# Patient Record
Sex: Female | Born: 1950
Health system: Southern US, Community
[De-identification: ages and names within clinical notes are randomized; demographics above are authoritative.]

## PROBLEM LIST (undated history)

## (undated) DIAGNOSIS — K259 Gastric ulcer, unspecified as acute or chronic, without hemorrhage or perforation: Secondary | ICD-10-CM

## (undated) DIAGNOSIS — Z8669 Personal history of other diseases of the nervous system and sense organs: Secondary | ICD-10-CM

## (undated) DIAGNOSIS — F32A Depression, unspecified: Secondary | ICD-10-CM

## (undated) DIAGNOSIS — R351 Nocturia: Secondary | ICD-10-CM

## (undated) DIAGNOSIS — M549 Dorsalgia, unspecified: Secondary | ICD-10-CM

## (undated) DIAGNOSIS — M1990 Unspecified osteoarthritis, unspecified site: Secondary | ICD-10-CM

## (undated) DIAGNOSIS — K219 Gastro-esophageal reflux disease without esophagitis: Secondary | ICD-10-CM

## (undated) DIAGNOSIS — M2550 Pain in unspecified joint: Secondary | ICD-10-CM

## (undated) DIAGNOSIS — L280 Lichen simplex chronicus: Secondary | ICD-10-CM

## (undated) DIAGNOSIS — G4700 Insomnia, unspecified: Secondary | ICD-10-CM

## (undated) DIAGNOSIS — F419 Anxiety disorder, unspecified: Secondary | ICD-10-CM

## (undated) DIAGNOSIS — T4145XA Adverse effect of unspecified anesthetic, initial encounter: Secondary | ICD-10-CM

## (undated) DIAGNOSIS — R319 Hematuria, unspecified: Secondary | ICD-10-CM

## (undated) DIAGNOSIS — K2970 Gastritis, unspecified, without bleeding: Secondary | ICD-10-CM

## (undated) DIAGNOSIS — G8929 Other chronic pain: Secondary | ICD-10-CM

## (undated) DIAGNOSIS — M2540 Effusion, unspecified joint: Secondary | ICD-10-CM

## (undated) DIAGNOSIS — F329 Major depressive disorder, single episode, unspecified: Secondary | ICD-10-CM

## (undated) DIAGNOSIS — J329 Chronic sinusitis, unspecified: Secondary | ICD-10-CM

## (undated) DIAGNOSIS — R51 Headache: Secondary | ICD-10-CM

## (undated) DIAGNOSIS — E039 Hypothyroidism, unspecified: Secondary | ICD-10-CM

## (undated) DIAGNOSIS — E559 Vitamin D deficiency, unspecified: Secondary | ICD-10-CM

## (undated) DIAGNOSIS — L0390 Cellulitis, unspecified: Secondary | ICD-10-CM

## (undated) DIAGNOSIS — M8580 Other specified disorders of bone density and structure, unspecified site: Secondary | ICD-10-CM

## (undated) DIAGNOSIS — E785 Hyperlipidemia, unspecified: Secondary | ICD-10-CM

## (undated) DIAGNOSIS — Z8619 Personal history of other infectious and parasitic diseases: Secondary | ICD-10-CM

## (undated) DIAGNOSIS — D649 Anemia, unspecified: Secondary | ICD-10-CM

## (undated) DIAGNOSIS — R7303 Prediabetes: Secondary | ICD-10-CM

## (undated) DIAGNOSIS — Z8719 Personal history of other diseases of the digestive system: Secondary | ICD-10-CM

## (undated) DIAGNOSIS — T8859XA Other complications of anesthesia, initial encounter: Secondary | ICD-10-CM

## (undated) HISTORY — DX: Unspecified osteoarthritis, unspecified site: M19.90

## (undated) HISTORY — DX: Anemia, unspecified: D64.9

## (undated) HISTORY — PX: TONSILLECTOMY: SUR1361

## (undated) HISTORY — DX: Other specified disorders of bone density and structure, unspecified site: M85.80

## (undated) HISTORY — PX: OTHER SURGICAL HISTORY: SHX169

## (undated) HISTORY — PX: COLONOSCOPY: SHX174

## (undated) HISTORY — PX: JOINT REPLACEMENT: SHX530

## (undated) HISTORY — DX: Cellulitis, unspecified: L03.90

## (undated) HISTORY — PX: ESOPHAGOGASTRODUODENOSCOPY: SHX1529

## (undated) HISTORY — DX: Lichen simplex chronicus: L28.0

## (undated) HISTORY — DX: Gastric ulcer, unspecified as acute or chronic, without hemorrhage or perforation: K25.9

## (undated) HISTORY — DX: Chronic sinusitis, unspecified: J32.9

## (undated) HISTORY — PX: TUBAL LIGATION: SHX77

## (undated) HISTORY — DX: Vitamin D deficiency, unspecified: E55.9

## (undated) HISTORY — DX: Personal history of other infectious and parasitic diseases: Z86.19

---

## 1992-04-01 HISTORY — PX: EYE SURGERY: SHX253

## 1998-01-03 ENCOUNTER — Ambulatory Visit: Admission: RE | Admit: 1998-01-03 | Discharge: 1998-01-03 | Payer: Self-pay | Admitting: Obstetrics & Gynecology

## 1998-04-21 ENCOUNTER — Encounter: Admission: RE | Admit: 1998-04-21 | Discharge: 1998-07-20 | Payer: Self-pay | Admitting: Family Medicine

## 1998-08-25 ENCOUNTER — Ambulatory Visit (HOSPITAL_COMMUNITY): Admission: RE | Admit: 1998-08-25 | Discharge: 1998-08-25 | Payer: Self-pay | Admitting: Cardiology

## 1998-09-12 ENCOUNTER — Encounter: Admission: RE | Admit: 1998-09-12 | Discharge: 1998-12-11 | Payer: Self-pay | Admitting: Family Medicine

## 1999-01-22 ENCOUNTER — Inpatient Hospital Stay (HOSPITAL_COMMUNITY): Admission: RE | Admit: 1999-01-22 | Discharge: 1999-01-26 | Payer: Self-pay | Admitting: Orthopedic Surgery

## 1999-02-16 ENCOUNTER — Other Ambulatory Visit: Admission: RE | Admit: 1999-02-16 | Discharge: 1999-02-16 | Payer: Self-pay | Admitting: *Deleted

## 1999-06-20 ENCOUNTER — Other Ambulatory Visit: Admission: RE | Admit: 1999-06-20 | Discharge: 1999-06-20 | Payer: Self-pay | Admitting: *Deleted

## 2000-09-08 ENCOUNTER — Other Ambulatory Visit: Admission: RE | Admit: 2000-09-08 | Discharge: 2000-09-08 | Payer: Self-pay | Admitting: *Deleted

## 2000-11-03 ENCOUNTER — Encounter: Admission: RE | Admit: 2000-11-03 | Discharge: 2001-02-01 | Payer: Self-pay | Admitting: Family Medicine

## 2001-01-14 ENCOUNTER — Encounter: Payer: Self-pay | Admitting: Orthopedic Surgery

## 2001-01-14 ENCOUNTER — Encounter: Admission: RE | Admit: 2001-01-14 | Discharge: 2001-01-14 | Payer: Self-pay | Admitting: Orthopedic Surgery

## 2001-07-14 ENCOUNTER — Encounter: Admission: RE | Admit: 2001-07-14 | Discharge: 2001-10-12 | Payer: Self-pay | Admitting: Family Medicine

## 2001-11-18 ENCOUNTER — Encounter: Admission: RE | Admit: 2001-11-18 | Discharge: 2002-02-16 | Payer: Self-pay | Admitting: Family Medicine

## 2001-12-02 ENCOUNTER — Other Ambulatory Visit: Admission: RE | Admit: 2001-12-02 | Discharge: 2001-12-02 | Payer: Self-pay | Admitting: *Deleted

## 2001-12-26 ENCOUNTER — Encounter: Payer: Self-pay | Admitting: Emergency Medicine

## 2001-12-26 ENCOUNTER — Emergency Department (HOSPITAL_COMMUNITY): Admission: EM | Admit: 2001-12-26 | Discharge: 2001-12-26 | Payer: Self-pay | Admitting: Emergency Medicine

## 2003-01-24 ENCOUNTER — Other Ambulatory Visit: Admission: RE | Admit: 2003-01-24 | Discharge: 2003-01-24 | Payer: Self-pay | Admitting: *Deleted

## 2003-04-25 ENCOUNTER — Ambulatory Visit (HOSPITAL_COMMUNITY): Admission: RE | Admit: 2003-04-25 | Discharge: 2003-04-25 | Payer: Self-pay | Admitting: Gastroenterology

## 2003-04-26 ENCOUNTER — Encounter (INDEPENDENT_AMBULATORY_CARE_PROVIDER_SITE_OTHER): Payer: Self-pay | Admitting: *Deleted

## 2004-03-13 ENCOUNTER — Other Ambulatory Visit: Admission: RE | Admit: 2004-03-13 | Discharge: 2004-03-13 | Payer: Self-pay | Admitting: *Deleted

## 2005-05-27 ENCOUNTER — Other Ambulatory Visit: Admission: RE | Admit: 2005-05-27 | Discharge: 2005-05-27 | Payer: Self-pay | Admitting: Obstetrics & Gynecology

## 2007-03-16 DIAGNOSIS — L280 Lichen simplex chronicus: Secondary | ICD-10-CM

## 2007-03-16 HISTORY — DX: Lichen simplex chronicus: L28.0

## 2007-11-23 ENCOUNTER — Other Ambulatory Visit: Admission: RE | Admit: 2007-11-23 | Discharge: 2007-11-23 | Payer: Self-pay | Admitting: Obstetrics and Gynecology

## 2010-08-17 NOTE — Op Note (Signed)
NAMEELLISE, KOVACK                        ACCOUNT NO.:  [id number]   MEDICAL RECORD NO.:  [id number]                   PATIENT TYPE:  AMB   LOCATION:  ENDO                                 FACILITY:  [facility]   PHYSICIAN:  Petra Kuba, M.D.                 DATE OF BIRTH:  Apr 05, 1950   DATE OF PROCEDURE:  04/25/2003  DATE OF DISCHARGE:                                 OPERATIVE REPORT   PROCEDURE:  Esophagogastroduodenoscopy with biopsy.   INDICATIONS:  Patient with upper abdominal pain, on chronic nonsteroidals,  helped by Prevacid.  Consent was signed after risks, benefits, methods, and  options thoroughly discussed in the office.   Additional medicines for this procedure since it followed the colonoscopy  was 25 mg of Demerol, 2 mg of Versed.   DESCRIPTION OF PROCEDURE:  The video endoscope was inserted by direct  vision.  The esophagus was normal.  There was a tiny hiatal hernia seen.  The scope passed into the stomach and advanced to the antrum, where some  mild antritis was seen but no frank erosions.  The scope was inserted  through a normal pylorus into a normal duodenal bulb and around the C-loop  to a normal second portion of the duodenum.  Possibly we even advanced to  the third portion.  A few scattered duodenal biopsies were obtained for her  family history of sprue.  The scope was withdrawn back to the bulb, and a  good look there ruled out ulcers in that location.  The scope was withdrawn  back to the stomach and retroflexed.  The angularis, cardia, and fundus,  lesser and greater curve were all normal except for some mild gastritis.  Straight visualization of the stomach confirmed the mild to moderate  antritis greater than the mild gastritis.  A few biopsies of the antrum and  a few of the proximal stomach were obtained to rule out any microscopic  problem like Helicobacter.  Air was suctioned, the scope removed.  Again a  good look at the esophagus was normal.   The scope was removed.  The patient  tolerated the procedure well.  There was no obvious immediate complication.   ENDOSCOPIC DIAGNOSES:  1. Tiny hiatal hernia.  2. Antritis greater than gastritis, status post biopsy.  3. Otherwise normal EGD to the second or third part of the duodenum, status     post biopsy for her family history of sprue.   PLAN:  Will await pathology.  Continue pump inhibitors, they seem to be  helping.  Will probably recheck an ultrasound next if she continues to have  upper abdominal pain.  Happy to see her back in two months p.r.n. or in two  months to recheck symptoms and make sure no further workup plans are needed.  Petra Kuba, M.D.    MEM/MEDQ  D:  04/25/2003  T:  04/26/2003  Job:  [id number]   cc:   Talmadge Coventry, M.D.  [address]  [place]  [place] [address]  Fax: [phone]   Louellen Molder, M.D., [facility]

## 2010-08-17 NOTE — Op Note (Signed)
Melissa Leach, Melissa Leach                        ACCOUNT NO.:  [id number]   MEDICAL RECORD NO.:  [id number]                   PATIENT TYPE:  AMB   LOCATION:  ENDO                                 FACILITY:  [facility]   PHYSICIAN:  Petra Kuba, M.D.                 DATE OF BIRTH:  23-Apr-1950   DATE OF PROCEDURE:  04/25/2003  DATE OF DISCHARGE:                                 OPERATIVE REPORT   PROCEDURE:  Colonoscopy.   INDICATIONS:  The patient is due for colonic screening with some bright red  blood per rectum and some constipation.  Consent was signed after risks,  benefits, methods, and options were thoroughly discussed in the office.   PREMEDICATIONS:  Demerol 50 mg, Versed 8 mg.   DESCRIPTION OF PROCEDURE:  Rectal inspection pertinent for small external  hemorrhoids.  Digital exam was negative.  Video pediatric adjustable  colonoscope was inserted and with rolling onto her back and various  abdominal pressures, was able to be advanced to the cecum.  Other than some  left-sided diverticula no abnormalities were seen on insertion.  The cecum  was identified by the appendiceal orifice and the ileocecal valve.  The  scope was inserted a short ways into the terminal ileum which was normal.  Photo documentation was obtained.  The scope was slowly withdrawn.  The prep  was adequate.  There was some liquid stool that required washing and  suctioning.  On slow withdrawal through the colon, other than the left-sided  diverticula, no abnormalities were seen.  Specifically no polyps, tumors or  masses.  Anorectal pull-through and retroflexion confirmed some small  hemorrhoids.  In the rectum, the scope reinserted a short ways up the left  side of the colon.  Air was suctioned and the scope removed.  The patient  tolerated the procedure well.  There were no obvious immediate  complications.   ENDOSCOPIC DIAGNOSES:  1. Internal and external small hemorrhoids.  2. Left-sided diverticula.  3.  Otherwise within normal limits to the terminal ileum.   PLAN:  Yearly rectals or guaiacs per Dr. Talmadge Coventry or her  gynecologist.  Will go ahead and continue workup with an EGD for her upper  abdominal pain and recheck colon screening in 5-10 years unless  recommendations change.                                               Petra Kuba, M.D.    MEM/MEDQ  D:  04/25/2003  T:  04/25/2003  Job:  [id number]   cc:   Talmadge Coventry, M.D.  [address]  [place]  [place] [address]  Fax: [phone]

## 2011-06-13 ENCOUNTER — Encounter (HOSPITAL_COMMUNITY): Payer: Self-pay | Admitting: Pharmacy Technician

## 2011-06-18 ENCOUNTER — Encounter (HOSPITAL_COMMUNITY)
Admission: RE | Admit: 2011-06-18 | Discharge: 2011-06-18 | Disposition: A | Discharge status: Home / Self Care | Payer: BC Managed Care – PPO | Source: Ambulatory Visit | Attending: Orthopedic Surgery | Admitting: Orthopedic Surgery

## 2011-06-18 ENCOUNTER — Encounter (HOSPITAL_COMMUNITY): Payer: Self-pay

## 2011-06-18 HISTORY — DX: Unspecified osteoarthritis, unspecified site: M19.90

## 2011-06-18 HISTORY — DX: Depression, unspecified: F32.A

## 2011-06-18 HISTORY — DX: Other complications of anesthesia, initial encounter: T88.59XA

## 2011-06-18 HISTORY — DX: Gastritis, unspecified, without bleeding: K29.70

## 2011-06-18 HISTORY — DX: Personal history of other diseases of the digestive system: Z87.19

## 2011-06-18 HISTORY — DX: Anxiety disorder, unspecified: F41.9

## 2011-06-18 HISTORY — DX: Adverse effect of unspecified anesthetic, initial encounter: T41.45XA

## 2011-06-18 HISTORY — DX: Headache: R51

## 2011-06-18 HISTORY — DX: Major depressive disorder, single episode, unspecified: F32.9

## 2011-06-18 LAB — CBC
MCH: 30.8 pg (ref 26.0–34.0)
MCHC: 32.6 g/dL (ref 30.0–36.0)
MCV: 94.4 fL (ref 78.0–100.0)
Platelets: 287 10*3/uL (ref 150–400)
RBC: 4.13 MIL/uL (ref 3.87–5.11)
RDW: 14 % (ref 11.5–15.5)

## 2011-06-18 LAB — PROTIME-INR: Prothrombin Time: 13.1 seconds (ref 11.6–15.2)

## 2011-06-18 LAB — BASIC METABOLIC PANEL
Chloride: 106 mEq/L (ref 96–112)
Potassium: 4.2 mEq/L (ref 3.5–5.1)
Sodium: 140 mEq/L (ref 135–145)

## 2011-06-18 LAB — URINALYSIS, ROUTINE W REFLEX MICROSCOPIC
Bilirubin Urine: NEGATIVE
Glucose, UA: NEGATIVE mg/dL
Leukocytes, UA: NEGATIVE
Nitrite: NEGATIVE
Specific Gravity, Urine: 1.013 (ref 1.005–1.030)
pH: 6 (ref 5.0–8.0)

## 2011-06-18 LAB — DIFFERENTIAL
Basophils Relative: 1 % (ref 0–1)
Eosinophils Absolute: 0.2 10*3/uL (ref 0.0–0.7)
Eosinophils Relative: 3 % (ref 0–5)
Lymphs Abs: 1.4 10*3/uL (ref 0.7–4.0)
Neutrophils Relative %: 66 % (ref 43–77)

## 2011-06-18 LAB — ABO/RH: ABO/RH(D): O NEG

## 2011-06-18 LAB — APTT: aPTT: 33 seconds (ref 24–37)

## 2011-06-18 NOTE — Patient Instructions (Signed)
YOUR SURGERY IS SCHEDULED ON:   Tuesday  3/26  AT 8:45 AM  REPORT TO [facility] AT:  6:15 AM      PHONE # FOR SHORT STAY IS [phone]  DO NOT EAT OR DRINK ANYTHING AFTER MIDNIGHT THE NIGHT BEFORE YOUR SURGERY.  YOU MAY BRUSH YOUR TEETH, RINSE OUT YOUR MOUTH--BUT NO WATER, NO FOOD, NO CHEWING GUM, NO MINTS, NO CANDIES, NO CHEWING TOBACCO.  PLEASE TAKE THE FOLLOWING MEDICATIONS THE AM OF YOUR SURGERY WITH A FEW SIPS OF WATER:  NO MEDS TO TAKE    IF YOU USE INHALERS--USE YOUR INHALERS THE AM OF YOUR SURGERY AND BRING INHALERS TO THE HOSPITAL -TAKE TO SURGERY.    IF YOU ARE DIABETIC:  DO NOT TAKE ANY DIABETIC MEDICATIONS THE AM OF YOUR SURGERY.  IF YOU TAKE INSULIN IN THE EVENINGS--PLEASE ONLY TAKE 1/2 NORMAL EVENING DOSE THE NIGHT BEFORE YOUR SURGERY.  NO INSULIN THE AM OF YOUR SURGERY.  IF YOU HAVE SLEEP APNEA AND USE CPAP OR BIPAP--PLEASE BRING THE MASK --NOT THE MACHINE-NOT THE TUBING   -JUST THE MASK. DO NOT BRING VALUABLES, MONEY, CREDIT CARDS.  CONTACT LENS, DENTURES / PARTIALS, GLASSES SHOULD NOT BE WORN TO SURGERY AND IN MOST CASES-HEARING AIDS WILL NEED TO BE REMOVED.  BRING YOUR GLASSES CASE, ANY EQUIPMENT NEEDED FOR YOUR CONTACT LENS. FOR PATIENTS ADMITTED TO THE HOSPITAL--CHECK OUT TIME THE DAY OF DISCHARGE IS 11:00 AM.  ALL INPATIENT ROOMS ARE PRIVATE - WITH BATHROOM, TELEPHONE, TELEVISION AND WIFI INTERNET. IF YOU ARE BEING DISCHARGED THE SAME DAY OF YOUR SURGERY--YOU CAN NOT DRIVE YOURSELF HOME--AND SHOULD NOT GO HOME ALONE BY TAXI OR BUS.  NO DRIVING OR OPERATING MACHINERY FOR 24 HOURS FOLLOWING ANESTHESIA / PAIN MEDICATIONS.                            SPECIAL INSTRUCTIONS:  CHLORHEXIDINE SOAP SHOWER (other brand names are Betasept and Hibiclens ) PLEASE SHOWER WITH CHLORHEXIDINE THE NIGHT BEFORE YOUR SURGERY AND THE AM OF YOUR SURGERY. DO NOT USE CHLORHEXIDINE ON YOUR FACE OR PRIVATE AREAS--YOU MAY USE YOUR NORMAL SOAP THOSE AREAS AND YOUR NORMAL SHAMPOO.    WOMEN SHOULD AVOID SHAVING UNDER ARMS AND SHAVING LEGS 48 HOURS BEFORE USING CHLORHEXIDINE TO AVOID SKIN IRRITATION.  DO NOT USE IF ALLERGIC TO CHLORHEXIDINE.  PLEASE READ OVER ANY  FACT SHEETS THAT YOU WERE GIVEN: MRSA INFORMATION, BLOOD TRANSFUSION INFORMATION, INCENTIVE SPIROMETER INFORMATION.

## 2011-06-18 NOTE — Pre-Procedure Instructions (Signed)
EKG AND CXR WERE NOT NEEDED PREOP PER ANESTHESIA GUIDELINES CBC WITHDIFF, BMET, PT, PTT, UA AND T/S WERE DONE TODAY PREOP. PT HAS NOTE OF MEDICAL CLEARANCE FROM DR. WOLTERS ON THIS CHART.

## 2011-06-19 LAB — SURGICAL PCR SCREEN: Staphylococcus aureus: POSITIVE — AB

## 2011-06-24 NOTE — Progress Notes (Signed)
Patient notified of time change and will report to short stay at [facility] 06/25/11

## 2011-06-24 NOTE — H&P (Signed)
Melissa Leach is an 61 y.o. female.    Chief Complaint: left hip OA and pain   HPI: Pt is a 61 y.o. female complaining of left hip pain for years, increasing significantly in the last 6 months. Pain had continually increased since the beginning. X-rays in the clinic show end-stage arthritic changes of the left hip. Pt has tried various conservative treatments which have failed to alleviate their symptoms, including NSAIDs and trochanteric steroid injections. Various options are discussed with the patient. Risks, benefits and expectations were discussed with the patient. Patient understand the risks, benefits and expectations and wishes to proceed with surgery.   PCP:  Melissa [name], MD, MD  D/C Plans:  Home with HHPT  Post-op Meds:   Rx given for ASA, Robaxin, Iron, Colace and MiraLax  Tranexamic Acid:   To be given  Decadron:   To be given  PMH: Past Medical History  Diagnosis Date  . Gastritis     HISTORY OF GASTRITIS--OCCAS ABDOMINAL PAIN   . H/O hiatal hernia   . Headache     MIGRAINES  . Arthritis     PAIN IN BOTH HIPS AND KNEES  . Anxiety   . Depression   . Complication of anesthesia     "SCRATCHED CORNEA"  WAKING UP FROM  KNEE SURGERY-    PSH: Past Surgical History  Procedure Date  . Joint replacement     RIGHT TOTAL KNEE REPLACEMENT AND RT TOTAL KNEE REVISION    AND  LEFT TOTAL KNEE REPLACEMENT  . Tubal ligation   . Tonsillectomy     Social History:  reports that she has quit smoking. She has never used smokeless tobacco. She reports that she does not drink alcohol or use illicit drugs.  Allergies:  Allergies  Allergen Reactions  . Sulfa Antibiotics Rash    Medications: No current facility-administered medications for this encounter.   Current Outpatient Prescriptions  Medication Sig Dispense Refill  . acetaminophen (TYLENOL) 500 MG tablet Take 1,000 mg by mouth every 6 (six) hours as needed. FOR HIP PAIN      . atorvastatin (LIPITOR) 40 MG  tablet Take 20 mg by mouth every evening.      Marland Kitchen buPROPion (WELLBUTRIN XL) 300 MG 24 hr tablet Take 300 mg by mouth every evening.      . Calcium Carbonate-Vitamin D (CALCIUM + D) 600-200 MG-UNIT TABS Take 2 tablets by mouth every evening.      Marland Kitchen LORazepam (ATIVAN) 0.5 MG tablet Take 0.5 mg by mouth every 8 (eight) hours as needed. For anxiety/sleep       . meloxicam (MOBIC) 15 MG tablet Take 15 mg by mouth every evening.      . Multiple Vitamin (MULITIVITAMIN WITH MINERALS) TABS Take 1 tablet by mouth every evening.      Marland Kitchen omega-3 acid ethyl esters (LOVAZA) 1 G capsule Take 2 g by mouth every evening.      Marland Kitchen omeprazole (PRILOSEC) 40 MG capsule Take 40 mg by mouth every evening.      . OXYCODONE-ACETAMINOPHEN PO Take by mouth. IF NEEDED FOR HIP PAIN  - PT DOES NOT KNOW THE STRENGTH      . SUMAtriptan (IMITREX) 100 MG tablet Take 100 mg by mouth every 2 (two) hours as needed. For migraine      . topiramate (TOPAMAX) 50 MG tablet Take 100 mg by mouth every evening.        ROS: Review of Systems  Constitutional: Negative.  HENT: Negative.   Eyes: Negative.   Respiratory: Negative.   Cardiovascular: Negative.   Gastrointestinal: Negative.   Genitourinary: Negative.   Musculoskeletal: Positive for joint pain.  Skin: Negative.   Neurological: Negative.   Endo/Heme/Allergies: Negative.   Psychiatric/Behavioral: The patient is nervous/anxious.      Physical Exam: BP: 120/78 ; HR: 80 ; Resp: 16 ; Physical Exam  Constitutional: She is oriented to person, place, and time and well-developed, well-nourished, and in no distress.  HENT:  Head: Normocephalic and atraumatic.  Nose: Nose normal.  Mouth/Throat: Oropharynx is clear and moist.  Eyes: Pupils are equal, round, and reactive to light.  Neck: Normal range of motion. No JVD present. No tracheal deviation present. No thyromegaly present.  Cardiovascular: Normal rate, regular rhythm and intact distal pulses.   Pulmonary/Chest: Effort  normal and breath sounds normal. No stridor.  Abdominal: Soft. There is no tenderness. There is no guarding.  Musculoskeletal:       Left hip: She exhibits decreased range of motion, decreased strength, tenderness and bony tenderness. She exhibits no swelling, no crepitus, no deformity and no laceration.  Lymphadenopathy:    She has no cervical adenopathy.  Neurological: She is alert and oriented to person, place, and time.  Skin: Skin is warm and dry.  Psychiatric: Affect normal.     Assessment/Plan Assessment: left hip OA and pain   Plan: Patient will undergo a left total hip arthroplasty, anterior approach on 06/25/2011 at [facility] per Dr. Charlann Leach.. Risks benefits and expectation were discussed with the patient. Patient understand risks, benefits and expectation and wishes to proceed.   Melissa Leach Melissa Leach   PAC  06/24/2011, 8:34 PM

## 2011-06-25 ENCOUNTER — Inpatient Hospital Stay (HOSPITAL_COMMUNITY): Payer: BC Managed Care – PPO

## 2011-06-25 ENCOUNTER — Inpatient Hospital Stay (HOSPITAL_COMMUNITY)
Admission: RE | Admit: 2011-06-25 | Discharge: 2011-06-27 | DRG: 818 | Disposition: A | Discharge status: Home Health | Payer: BC Managed Care – PPO | Source: Ambulatory Visit | Attending: Orthopedic Surgery | Admitting: Orthopedic Surgery

## 2011-06-25 ENCOUNTER — Inpatient Hospital Stay (HOSPITAL_COMMUNITY): Payer: BC Managed Care – PPO | Admitting: Anesthesiology

## 2011-06-25 ENCOUNTER — Encounter (HOSPITAL_COMMUNITY)
Admission: RE | Disposition: A | Discharge status: Home Health | Payer: Self-pay | Source: Ambulatory Visit | Attending: Orthopedic Surgery

## 2011-06-25 ENCOUNTER — Encounter (HOSPITAL_COMMUNITY): Payer: Self-pay | Admitting: Anesthesiology

## 2011-06-25 ENCOUNTER — Encounter (HOSPITAL_COMMUNITY): Payer: Self-pay | Admitting: *Deleted

## 2011-06-25 DIAGNOSIS — Z96659 Presence of unspecified artificial knee joint: Secondary | ICD-10-CM

## 2011-06-25 DIAGNOSIS — F329 Major depressive disorder, single episode, unspecified: Secondary | ICD-10-CM | POA: Diagnosis present

## 2011-06-25 DIAGNOSIS — M129 Arthropathy, unspecified: Secondary | ICD-10-CM | POA: Diagnosis present

## 2011-06-25 DIAGNOSIS — M169 Osteoarthritis of hip, unspecified: Principal | ICD-10-CM | POA: Diagnosis present

## 2011-06-25 DIAGNOSIS — F3289 Other specified depressive episodes: Secondary | ICD-10-CM | POA: Diagnosis present

## 2011-06-25 DIAGNOSIS — F411 Generalized anxiety disorder: Secondary | ICD-10-CM | POA: Diagnosis present

## 2011-06-25 DIAGNOSIS — Z96649 Presence of unspecified artificial hip joint: Secondary | ICD-10-CM

## 2011-06-25 DIAGNOSIS — M1610 Unilateral primary osteoarthritis, unspecified hip: Principal | ICD-10-CM | POA: Diagnosis present

## 2011-06-25 HISTORY — PX: TOTAL HIP ARTHROPLASTY: SHX124

## 2011-06-25 LAB — TYPE AND SCREEN

## 2011-06-25 SURGERY — ARTHROPLASTY, HIP, TOTAL, ANTERIOR APPROACH
Anesthesia: Spinal | Site: Hip | Laterality: Left | Wound class: Clean

## 2011-06-25 MED ORDER — ALUMINUM HYDROXIDE GEL 320 MG/5ML PO SUSP
15.0000 mL | ORAL | Status: DC | PRN
  Filled 2011-06-25: qty 30

## 2011-06-25 MED ORDER — OXYCODONE HCL 5 MG PO TABS
5.0000 mg | ORAL_TABLET | ORAL | Status: DC | PRN
  Administered 2011-06-25 (×2): 10 mg via ORAL
  Administered 2011-06-25 (×2): 5 mg via ORAL
  Administered 2011-06-26 (×2): 10 mg via ORAL
  Filled 2011-06-25: qty 1
  Filled 2011-06-25 (×4): qty 2
  Filled 2011-06-25: qty 1

## 2011-06-25 MED ORDER — DROPERIDOL 2.5 MG/ML IJ SOLN
INTRAMUSCULAR | Status: DC | PRN
  Administered 2011-06-25: .5 mg via INTRAVENOUS

## 2011-06-25 MED ORDER — LORAZEPAM 0.5 MG PO TABS
0.5000 mg | ORAL_TABLET | Freq: Three times a day (TID) | ORAL | Status: DC | PRN
  Administered 2011-06-26: 0.5 mg via ORAL
  Filled 2011-06-25: qty 1

## 2011-06-25 MED ORDER — ATORVASTATIN CALCIUM 20 MG PO TABS
20.0000 mg | ORAL_TABLET | Freq: Every evening | ORAL | Status: DC
  Administered 2011-06-25 – 2011-06-26 (×2): 20 mg via ORAL
  Filled 2011-06-25 (×3): qty 1

## 2011-06-25 MED ORDER — DEXAMETHASONE SODIUM PHOSPHATE 10 MG/ML IJ SOLN
10.0000 mg | Freq: Once | INTRAMUSCULAR | Status: AC
  Administered 2011-06-26: 10 mg via INTRAVENOUS
  Filled 2011-06-25: qty 1

## 2011-06-25 MED ORDER — BUPIVACAINE HCL (PF) 0.5 % IJ SOLN
INTRAMUSCULAR | Status: DC | PRN
  Administered 2011-06-25: 3 mL

## 2011-06-25 MED ORDER — TOPIRAMATE 100 MG PO TABS
100.0000 mg | ORAL_TABLET | Freq: Every evening | ORAL | Status: DC
  Administered 2011-06-25 – 2011-06-26 (×2): 100 mg via ORAL
  Filled 2011-06-25 (×3): qty 1

## 2011-06-25 MED ORDER — ACETAMINOPHEN 10 MG/ML IV SOLN
INTRAVENOUS | Status: DC | PRN
  Administered 2011-06-25: 1000 mg via INTRAVENOUS

## 2011-06-25 MED ORDER — CEFAZOLIN SODIUM-DEXTROSE 2-3 GM-% IV SOLR
2.0000 g | INTRAVENOUS | Status: AC
  Administered 2011-06-25: 2 g via INTRAVENOUS

## 2011-06-25 MED ORDER — BUPROPION HCL ER (XL) 300 MG PO TB24
300.0000 mg | ORAL_TABLET | Freq: Every evening | ORAL | Status: DC
  Administered 2011-06-25 – 2011-06-26 (×2): 300 mg via ORAL
  Filled 2011-06-25 (×3): qty 1

## 2011-06-25 MED ORDER — KETAMINE HCL 10 MG/ML IJ SOLN
INTRAMUSCULAR | Status: DC | PRN
  Administered 2011-06-25: 10 mg via INTRAVENOUS

## 2011-06-25 MED ORDER — TRANEXAMIC ACID 100 MG/ML IV SOLN
15.0000 mg/kg | Freq: Once | INTRAVENOUS | Status: AC
  Administered 2011-06-25: 1320 mg via INTRAVENOUS
  Filled 2011-06-25: qty 13.2

## 2011-06-25 MED ORDER — ONDANSETRON HCL 4 MG/2ML IJ SOLN
INTRAMUSCULAR | Status: DC | PRN
  Administered 2011-06-25 (×2): 2 mg via INTRAVENOUS

## 2011-06-25 MED ORDER — CHLORHEXIDINE GLUCONATE 4 % EX LIQD: 60.0000 mL | Freq: Once | CUTANEOUS | Status: DC

## 2011-06-25 MED ORDER — HYDROMORPHONE HCL PF 1 MG/ML IJ SOLN: 0.2000 mg | INTRAMUSCULAR | Status: DC | PRN

## 2011-06-25 MED ORDER — PHENOL 1.4 % MT LIQD: 1.0000 | OROMUCOSAL | Status: DC | PRN

## 2011-06-25 MED ORDER — PROPOFOL 10 MG/ML IV BOLUS
INTRAVENOUS | Status: DC | PRN
  Administered 2011-06-25: 50 mg via INTRAVENOUS
  Administered 2011-06-25: 100 mg via INTRAVENOUS

## 2011-06-25 MED ORDER — CEFAZOLIN SODIUM 1-5 GM-% IV SOLN
1.0000 g | Freq: Four times a day (QID) | INTRAVENOUS | Status: AC
  Administered 2011-06-25 – 2011-06-26 (×3): 1 g via INTRAVENOUS
  Filled 2011-06-25 (×3): qty 50

## 2011-06-25 MED ORDER — SODIUM CHLORIDE 0.9 % IV SOLN
INTRAVENOUS | Status: DC
  Administered 2011-06-25 – 2011-06-26 (×3): via INTRAVENOUS
  Filled 2011-06-25 (×10): qty 1000

## 2011-06-25 MED ORDER — MEPERIDINE HCL 50 MG/ML IJ SOLN: 6.2500 mg | INTRAMUSCULAR | Status: DC | PRN

## 2011-06-25 MED ORDER — METHOCARBAMOL 500 MG PO TABS
500.0000 mg | ORAL_TABLET | Freq: Four times a day (QID) | ORAL | Status: DC | PRN
  Administered 2011-06-26 – 2011-06-27 (×4): 500 mg via ORAL
  Filled 2011-06-25 (×5): qty 1

## 2011-06-25 MED ORDER — DIPHENHYDRAMINE HCL 12.5 MG/5ML PO ELIX: 25.0000 mg | ORAL_SOLUTION | Freq: Four times a day (QID) | ORAL | Status: DC | PRN

## 2011-06-25 MED ORDER — FENTANYL CITRATE 0.05 MG/ML IJ SOLN
INTRAMUSCULAR | Status: DC | PRN
  Administered 2011-06-25 (×2): 50 ug via INTRAVENOUS

## 2011-06-25 MED ORDER — RIVAROXABAN 10 MG PO TABS
10.0000 mg | ORAL_TABLET | Freq: Every day | ORAL | Status: DC
  Administered 2011-06-26 – 2011-06-27 (×2): 10 mg via ORAL
  Filled 2011-06-25 (×2): qty 1

## 2011-06-25 MED ORDER — ONDANSETRON HCL 4 MG/2ML IJ SOLN: 4.0000 mg | Freq: Four times a day (QID) | INTRAMUSCULAR | Status: DC | PRN

## 2011-06-25 MED ORDER — PROMETHAZINE HCL 25 MG/ML IJ SOLN: 6.2500 mg | INTRAMUSCULAR | Status: DC | PRN

## 2011-06-25 MED ORDER — SUMATRIPTAN SUCCINATE 100 MG PO TABS
100.0000 mg | ORAL_TABLET | ORAL | Status: DC | PRN
  Filled 2011-06-25: qty 1

## 2011-06-25 MED ORDER — PROPOFOL 10 MG/ML IV EMUL
INTRAVENOUS | Status: DC | PRN
  Administered 2011-06-25: 75 ug/kg/min via INTRAVENOUS

## 2011-06-25 MED ORDER — FERROUS SULFATE 325 (65 FE) MG PO TABS
325.0000 mg | ORAL_TABLET | Freq: Three times a day (TID) | ORAL | Status: DC
  Administered 2011-06-25 – 2011-06-27 (×7): 325 mg via ORAL
  Filled 2011-06-25: qty 3
  Filled 2011-06-25: qty 1
  Filled 2011-06-25: qty 3
  Filled 2011-06-25: qty 1

## 2011-06-25 MED ORDER — DEXTROSE 5 % IV SOLN
500.0000 mg | Freq: Four times a day (QID) | INTRAVENOUS | Status: DC | PRN
  Filled 2011-06-25: qty 5

## 2011-06-25 MED ORDER — LACTATED RINGERS IV SOLN
INTRAVENOUS | Status: DC | PRN
  Administered 2011-06-25 (×2): via INTRAVENOUS

## 2011-06-25 MED ORDER — HETASTARCH-ELECTROLYTES 6 % IV SOLN
INTRAVENOUS | Status: DC | PRN
  Administered 2011-06-25: 09:00:00 via INTRAVENOUS

## 2011-06-25 MED ORDER — 0.9 % SODIUM CHLORIDE (POUR BTL) OPTIME
TOPICAL | Status: DC | PRN
  Administered 2011-06-25: 1000 mL

## 2011-06-25 MED ORDER — LACTATED RINGERS IV SOLN: INTRAVENOUS | Status: DC

## 2011-06-25 MED ORDER — HYDROMORPHONE HCL PF 1 MG/ML IJ SOLN
0.2500 mg | INTRAMUSCULAR | Status: DC | PRN
  Administered 2011-06-25: 0.5 mg via INTRAVENOUS

## 2011-06-25 MED ORDER — PANTOPRAZOLE SODIUM 40 MG PO TBEC
80.0000 mg | DELAYED_RELEASE_TABLET | Freq: Every day | ORAL | Status: DC
  Administered 2011-06-25 – 2011-06-27 (×3): 80 mg via ORAL
  Filled 2011-06-25: qty 1
  Filled 2011-06-25: qty 2
  Filled 2011-06-25 (×3): qty 1

## 2011-06-25 MED ORDER — EPHEDRINE SULFATE 50 MG/ML IJ SOLN
INTRAMUSCULAR | Status: DC | PRN
  Administered 2011-06-25: 5 mg via INTRAVENOUS
  Administered 2011-06-25 (×2): 10 mg via INTRAVENOUS

## 2011-06-25 MED ORDER — MENTHOL 3 MG MT LOZG
1.0000 | LOZENGE | OROMUCOSAL | Status: DC | PRN
  Filled 2011-06-25: qty 9

## 2011-06-25 MED ORDER — MIDAZOLAM HCL 5 MG/5ML IJ SOLN
INTRAMUSCULAR | Status: DC | PRN
  Administered 2011-06-25: 2 mg via INTRAVENOUS

## 2011-06-25 MED ORDER — SENNA 8.6 MG PO TABS
1.0000 | ORAL_TABLET | Freq: Two times a day (BID) | ORAL | Status: DC
  Administered 2011-06-25 – 2011-06-27 (×4): 8.6 mg via ORAL
  Filled 2011-06-25 (×4): qty 1

## 2011-06-25 MED ORDER — DEXAMETHASONE SODIUM PHOSPHATE 10 MG/ML IJ SOLN
10.0000 mg | Freq: Once | INTRAMUSCULAR | Status: AC
  Administered 2011-06-25: 10 mg via INTRAVENOUS

## 2011-06-25 MED ORDER — DOCUSATE SODIUM 100 MG PO CAPS
100.0000 mg | ORAL_CAPSULE | Freq: Two times a day (BID) | ORAL | Status: DC
  Administered 2011-06-25 – 2011-06-27 (×4): 100 mg via ORAL
  Filled 2011-06-25 (×4): qty 1

## 2011-06-25 MED ORDER — POLYETHYLENE GLYCOL 3350 17 G PO PACK: 17.0000 g | PACK | Freq: Every day | ORAL | Status: DC | PRN

## 2011-06-25 MED ORDER — ONDANSETRON HCL 4 MG PO TABS: 4.0000 mg | ORAL_TABLET | Freq: Four times a day (QID) | ORAL | Status: DC | PRN

## 2011-06-25 SURGICAL SUPPLY — 41 items
ADH SKN CLS APL DERMABOND .7 (GAUZE/BANDAGES/DRESSINGS) ×1
BAG SPEC THK2 15X12 ZIP CLS (MISCELLANEOUS) ×2
BAG ZIPLOCK 12X15 (MISCELLANEOUS) ×4 IMPLANT
BLADE SAW SGTL 18X1.27X75 (BLADE) ×2 IMPLANT
CELLS DAT CNTRL 66122 CELL SVR (MISCELLANEOUS) ×1 IMPLANT
CLOTH BEACON ORANGE TIMEOUT ST (SAFETY) ×2 IMPLANT
DERMABOND ADVANCED (GAUZE/BANDAGES/DRESSINGS) ×1
DERMABOND ADVANCED .7 DNX12 (GAUZE/BANDAGES/DRESSINGS) ×1 IMPLANT
DRAPE C-ARM 42X72 X-RAY (DRAPES) ×2 IMPLANT
DRAPE STERI IOBAN 125X83 (DRAPES) ×2 IMPLANT
DRAPE U-SHAPE 47X51 STRL (DRAPES) ×6 IMPLANT
DRSG AQUACEL AG ADV 3.5X10 (GAUZE/BANDAGES/DRESSINGS) ×2 IMPLANT
DRSG TEGADERM 4X4.75 (GAUZE/BANDAGES/DRESSINGS) ×1 IMPLANT
DURAPREP 26ML APPLICATOR (WOUND CARE) ×2 IMPLANT
ELECT BLADE TIP CTD 4 INCH (ELECTRODE) ×2 IMPLANT
ELECT REM PT RETURN 9FT ADLT (ELECTROSURGICAL) ×2
ELECTRODE REM PT RTRN 9FT ADLT (ELECTROSURGICAL) ×1 IMPLANT
EVACUATOR 1/8 PVC DRAIN (DRAIN) ×1 IMPLANT
FACESHIELD LNG OPTICON STERILE (SAFETY) ×8 IMPLANT
GAUZE SPONGE 2X2 8PLY STRL LF (GAUZE/BANDAGES/DRESSINGS) ×1 IMPLANT
GLOVE BIOGEL PI IND STRL 7.5 (GLOVE) ×1 IMPLANT
GLOVE BIOGEL PI IND STRL 8 (GLOVE) ×1 IMPLANT
GLOVE BIOGEL PI INDICATOR 7.5 (GLOVE) ×1
GLOVE BIOGEL PI INDICATOR 8 (GLOVE) ×1
GLOVE ECLIPSE 8.0 STRL XLNG CF (GLOVE) ×2 IMPLANT
GLOVE ORTHO TXT STRL SZ7.5 (GLOVE) ×4 IMPLANT
GOWN BRE IMP PREV XXLGXLNG (GOWN DISPOSABLE) ×4 IMPLANT
GOWN STRL NON-REIN LRG LVL3 (GOWN DISPOSABLE) ×2 IMPLANT
KIT BASIN OR (CUSTOM PROCEDURE TRAY) ×2 IMPLANT
PACK TOTAL JOINT (CUSTOM PROCEDURE TRAY) ×2 IMPLANT
PADDING CAST COTTON 6X4 STRL (CAST SUPPLIES) ×2 IMPLANT
RETRACTOR WND ALEXIS 18 MED (MISCELLANEOUS) ×1 IMPLANT
RTRCTR WOUND ALEXIS 18CM MED (MISCELLANEOUS) ×2
SPONGE GAUZE 2X2 STER 10/PKG (GAUZE/BANDAGES/DRESSINGS) ×1
SUCTION FRAZIER 12FR DISP (SUCTIONS) ×2 IMPLANT
SUT MNCRL AB 4-0 PS2 18 (SUTURE) ×2 IMPLANT
SUT VIC AB 1 CT1 36 (SUTURE) ×8 IMPLANT
SUT VIC AB 2-0 CT1 27 (SUTURE) ×4
SUT VIC AB 2-0 CT1 TAPERPNT 27 (SUTURE) ×2 IMPLANT
TOWEL OR 17X26 10 PK STRL BLUE (TOWEL DISPOSABLE) ×4 IMPLANT
TRAY FOLEY CATH 14FRSI W/METER (CATHETERS) ×2 IMPLANT

## 2011-06-25 NOTE — Progress Notes (Signed)
Dr. Latanya Presser made aware of patient's blood pressures- O.K. To go to floor

## 2011-06-25 NOTE — Anesthesia Preprocedure Evaluation (Addendum)
Anesthesia Evaluation  Patient identified by MRN, date of birth, ID band Patient awake    Reviewed: Allergy & Precautions, H&P , NPO status , Patient's Chart, lab work & pertinent test results  History of Anesthesia Complications Negative for: history of anesthetic complications  Airway Mallampati: II TM Distance: >3 FB Neck ROM: Full    Dental No notable dental hx.    Pulmonary neg pulmonary ROS,  breath sounds clear to auscultation  Pulmonary exam normal       Cardiovascular negative cardio ROS  Rhythm:Regular Rate:Normal     Neuro/Psych Anxiety Depression negative neurological ROS  negative psych ROS   GI/Hepatic negative GI ROS, Neg liver ROS, hiatal hernia,   Endo/Other  negative endocrine ROS  Renal/GU negative Renal ROS  negative genitourinary   Musculoskeletal negative musculoskeletal ROS (+)   Abdominal   Peds negative pediatric ROS (+)  Hematology negative hematology ROS (+)   Anesthesia Other Findings   Reproductive/Obstetrics negative OB ROS                           Anesthesia Physical Anesthesia Plan  ASA: II  Anesthesia Plan: Spinal   Post-op Pain Management:    Induction: Intravenous  Airway Management Planned: Simple Face Mask  Additional Equipment:   Intra-op Plan:   Post-operative Plan:   Informed Consent: I have reviewed the patients History and Physical, chart, labs and discussed the procedure including the risks, benefits and alternatives for the proposed anesthesia with the patient or authorized representative who has indicated his/her understanding and acceptance.   Dental advisory given  Plan Discussed with: CRNA  Anesthesia Plan Comments:         Anesthesia Quick Evaluation

## 2011-06-25 NOTE — Anesthesia Procedure Notes (Signed)
Spinal  Patient location during procedure: OR Staffing Anesthesiologist: [name] Aristizabal Performed by: anesthesiologist  Preanesthetic Checklist Completed: patient identified, site marked, surgical consent, pre-op evaluation, timeout performed, IV checked, risks and benefits discussed and monitors and equipment checked Spinal Block Patient position: sitting Prep: Betadine Patient monitoring: heart rate, continuous pulse ox and blood pressure Approach: right paramedian Location: L2-3 Injection technique: single-shot Needle Needle type: Spinocan  Needle gauge: 22 G Needle length: 9 cm Additional Notes Expiration date of kit checked and confirmed. Patient tolerated procedure well, without complications.     

## 2011-06-25 NOTE — Progress Notes (Signed)
Xray results reviewed.

## 2011-06-25 NOTE — Interval H&P Note (Signed)
History and Physical Interval Note:  06/25/2011 6:56 AM  Melissa Leach  has presented today for surgery, with the diagnosis of Osteoarthritis of the Left Hip  The various methods of treatment have been discussed with the patient and family. After consideration of risks, benefits and other options for treatment, the patient has consented to  Procedure(s) (LRB): LEFT TOTAL HIP ARTHROPLASTY ANTERIOR APPROACH (Left) as a surgical intervention .  The patients' history has been reviewed, patient examined, no change in status, stable for surgery.  I have reviewed the patients' chart and labs.  Questions were answered to the patient's satisfaction.     [name]

## 2011-06-25 NOTE — Addendum Note (Signed)
Addendum  created 06/25/11 1139 by Valeda Malm, CRNA   Modules edited:Anesthesia LDA

## 2011-06-25 NOTE — Anesthesia Postprocedure Evaluation (Signed)
  Anesthesia Post-op Note  Patient: Melissa Leach  Procedure(s) Performed: Procedure(s) (LRB): TOTAL HIP ARTHROPLASTY ANTERIOR APPROACH (Left)  Patient Location: PACU  Anesthesia Type: Spinal  Level of Consciousness: awake and alert   Airway and Oxygen Therapy: Patient Spontanous Breathing  Post-op Pain: mild  Post-op Assessment: Post-op Vital signs reviewed, Patient's Cardiovascular Status Stable, Respiratory Function Stable, Patent Airway and No signs of Nausea or vomiting  Post-op Vital Signs: stable  Complications: No apparent anesthesia complications

## 2011-06-25 NOTE — Addendum Note (Signed)
Addendum  created 06/25/11 1139 by Sheron Robin S Dshaun Reppucci, CRNA   Modules edited:Anesthesia LDA    

## 2011-06-25 NOTE — Transfer of Care (Signed)
Immediate Anesthesia Transfer of Care Note  Patient: Melissa Leach  Procedure(s) Performed: Procedure(s) (LRB): TOTAL HIP ARTHROPLASTY ANTERIOR APPROACH (Left)  Patient Location: PACU  Anesthesia Type: General and Regional  Level of Consciousness: awake and alert   Airway & Oxygen Therapy: Patient Spontanous Breathing and Patient connected to face mask oxygen  Post-op Assessment: Report given to PACU RN, Post -op Vital signs reviewed and stable and Patient moving all extremities  Post vital signs: Reviewed and stable  Complications: No apparent anesthesia complications

## 2011-06-25 NOTE — Progress Notes (Signed)
Portable AP Pelvis and Lateral left hip x-rays done

## 2011-06-25 NOTE — Op Note (Signed)
NAME:  Melissa Leach                ACCOUNT NO.: [id number]      MEDICAL RECORD NO.: [id number]      FACILITY:  [facility]      PHYSICIAN:  Durene Romans D  DATE OF BIRTH:  1950/09/18     DATE OF PROCEDURE:  06/25/2011                                 OPERATIVE REPORT         PREOPERATIVE DIAGNOSIS: Left  hip osteoarthritis.      POSTOPERATIVE DIAGNOSIS:  Left hip osteoarthritis.      PROCEDURE:  Left total hip replacement through an anterior approach   utilizing DePuy THR system, component size 50 pinnacle cup, a size 32+4 neutral   Altrex liner, a size 0 high offset Tri Lock stem with a 32+1 delta ceramic   ball.      SURGEON:  Madlyn Frankel. Charlann Boxer, M.D.      ASSISTANT:  Leilani Able, PA      ANESTHESIA:  Spinal plus LMA     SPECIMENS:  None.      COMPLICATIONS:  None.      BLOOD LOSS:  500 cc     DRAINS:  One Hemovac.      INDICATION OF THE PROCEDURE:  Melissa Leach is a 61 y.o. female who had   presented to office for evaluation of left hip pain.  Radiographs revealed   progressive degenerative changes with bone-on-bone   articulation to the  hip joint.  The patient had painful limited range of   motion significantly affecting their overall quality of life.  The patient was failing to    respond to conservative measures, and at this point was ready   to proceed with more definitive measures.  The patient has noted progressive   degenerative changes in his hip, progressive problems and dysfunction   with regarding the hip prior to surgery.  Consent was obtained for   benefit of pain relief.  Specific risk of infection, DVT, component   failure, dislocation, need for revision surgery, as well discussion of   the anterior versus posterior approach were reviewed.  Consent was   obtained for benefit of anterior pain relief through an anterior   approach.      PROCEDURE IN DETAIL:  The patient was brought to operative theater.   Once adequate  anesthesia, preoperative antibiotics, 2gm Ancef administered.   The patient was positioned supine on the OSI Hanna table.  Once adequate   padding of boney process was carried out, we had predraped out the hip, and  used fluoroscopy to confirm orientation of the pelvis and position.      The left hip was then prepped and draped from proximal iliac crest to   mid thigh with shower curtain technique.      Time-out was performed identifying the patient, planned procedure, and   extremity.     An incision was then made 2 cm distal and lateral to the   anterior superior iliac spine extending over the orientation of the   tensor fascia lata muscle and sharp dissection was carried down to the   fascia of the muscle and protractor placed in the soft tissues.      The fascia was then incised.  The muscle belly was  identified and swept   laterally and retractor placed along the superior neck.  Following   cauterization of the circumflex vessels and removing some pericapsular   fat, a second cobra retractor was placed on the inferior neck.  A third   retractor was placed on the anterior acetabulum after elevating the   anterior rectus.  A L-capsulotomy was along the line of the   superior neck to the trochanteric fossa, then extended proximally and   distally.  Tag sutures were placed and the retractors were then placed   intracapsular.  We then identified the trochanteric fossa and   orientation of my neck cut, confirmed this radiographically   and then made a neck osteotomy with the femur on traction.  The femoral   head was removed without difficulty or complication.  Traction was let   off and retractors were placed posterior and anterior around the   acetabulum.      The labrum and foveal tissue were debrided.  I began reaming with a 45mm   reamer and reamed up to 49mm reamer with good bony bed preparation and a 50   cup was chosen.  The final 50mm Pinnacle cup was then impacted under  fluoroscopy  to confirm the depth of penetration and orientation with respect to   abduction.  I placed 2 screws was placed followed by the hole eliminator.  The final   32+4 Altrex liner was impacted with good visualized rim fit.  The cup was positioned anatomically within the acetabular portion of the pelvis.      At this point, the femur was rolled at 80 degrees.  Further capsule was   released off the inferior aspect of the femoral neck.  I then   released the superior capsule proximally.  The hook was placed laterally   along the femur and elevated manually and held in position with the bed   hook.  The leg was then extended and adducted with the leg rolled to 100   degrees of external rotation.  Once the proximal femur was fully   exposed, I used a box osteotome to set orientation.  I then began   broaching with the starting chili pepper broach and passed this by hand and then broached up to 0.  With the 0 broach in place I chose a high offset neck and 32+1 based on a series of trial reductions.  The offset was appropriate, leg lengths   appeared to be equal, confirmed radiographically.   Given these findings, I went ahead and dislocated the hip, repositioned all   retractors and positioned the right hip in the extended and abducted position.  The final 0 high offset Tri Lock stem was   chosen and it was impacted down to the level of neck cut.  Based on this   and the trial reduction, a 32+1 delta ceramic ball was chosen and   impacted onto a clean and dry trunnion, and the hip was reduced.  The   hip had been irrigated throughout the case again at this point.  I did   reapproximate the superior capsular leaflet to the anterior leaflet   using #1 Vicryl, placed a medium Hemovac drain deep.  The fascia of the   tensor fascia lata muscle was then reapproximated using #1 Vicryl.  The   remaining wound was closed with 2-0 Vicryl and running 4-0 Monocryl.   The hip was cleaned, dried, and  dressed sterilely using Dermabond and  Aquacel dressing.  Drain site dressed separately.  She was then brought   to recovery room in stable condition tolerating the procedure well.    Leilani Able, PA-C was present for the entirety of the case involved from   preoperative positioning, perioperative retractor management, general   facilitation of the case, as well as primary wound closure as assistant.            Madlyn Frankel Charlann Boxer, M.D.            MDO/MEDQ  D:  01/22/2011  T:  01/22/2011  Job:  [id number]      Electronically Signed by Durene Romans M.D. on 01/28/2011 09:15:38 AM

## 2011-06-26 DIAGNOSIS — Z96649 Presence of unspecified artificial hip joint: Secondary | ICD-10-CM

## 2011-06-26 LAB — BASIC METABOLIC PANEL
CO2: 23 mEq/L (ref 19–32)
Chloride: 109 mEq/L (ref 96–112)
Sodium: 140 mEq/L (ref 135–145)

## 2011-06-26 LAB — CBC
Platelets: 256 10*3/uL (ref 150–400)
RBC: 3.39 MIL/uL — ABNORMAL LOW (ref 3.87–5.11)
WBC: 10.3 10*3/uL (ref 4.0–10.5)

## 2011-06-26 MED ORDER — POLYETHYLENE GLYCOL 3350 17 G PO PACK: 17.0000 g | PACK | Freq: Every day | ORAL | Status: AC | PRN

## 2011-06-26 MED ORDER — OXYCODONE HCL 5 MG PO TABS
5.0000 mg | ORAL_TABLET | ORAL | Status: DC
  Administered 2011-06-26: 10 mg via ORAL
  Administered 2011-06-26: 5 mg via ORAL
  Administered 2011-06-26 – 2011-06-27 (×4): 10 mg via ORAL
  Administered 2011-06-27: 5 mg via ORAL
  Filled 2011-06-26 (×7): qty 2

## 2011-06-26 MED ORDER — ASPIRIN EC 325 MG PO TBEC: 325.0000 mg | DELAYED_RELEASE_TABLET | Freq: Two times a day (BID) | ORAL | Status: AC

## 2011-06-26 MED ORDER — FERROUS SULFATE 325 (65 FE) MG PO TABS: 325.0000 mg | ORAL_TABLET | Freq: Three times a day (TID) | ORAL | Status: DC

## 2011-06-26 MED ORDER — METHOCARBAMOL 500 MG PO TABS: 500.0000 mg | ORAL_TABLET | Freq: Four times a day (QID) | ORAL | Status: AC | PRN

## 2011-06-26 MED ORDER — DSS 100 MG PO CAPS: 100.0000 mg | ORAL_CAPSULE | Freq: Two times a day (BID) | ORAL | Status: AC

## 2011-06-26 NOTE — Evaluation (Signed)
Physical Therapy Evaluation Patient Details Name: Melissa Leach MRN: [id number] DOB: [birth year] Today's Date: 06/26/2011  Problem List:  Patient Active Problem List  Diagnoses  . S/P left THA, AA    Past Medical History:  Past Medical History  Diagnosis Date  . Gastritis     HISTORY OF GASTRITIS--OCCAS ABDOMINAL PAIN   . H/O hiatal hernia   . Headache     MIGRAINES  . Arthritis     PAIN IN BOTH HIPS AND KNEES  . Anxiety   . Depression   . Complication of anesthesia     "SCRATCHED CORNEA"  WAKING UP FROM  KNEE SURGERY-   Past Surgical History:  Past Surgical History  Procedure Date  . Joint replacement     RIGHT TOTAL KNEE REPLACEMENT AND RT TOTAL KNEE REVISION    AND  LEFT TOTAL KNEE REPLACEMENT  . Tubal ligation   . Tonsillectomy     PT Assessment/Plan/Recommendation PT Assessment Clinical Impression Statement: pt is s/p direct ant. THA on L. pt tolerated very well w/ no pain, only soreness. pt plans to DC to home . PT Recommendation/Assessment: Patient will need skilled PT in the acute care venue PT Problem List: Decreased strength;Decreased mobility;Decreased knowledge of use of DME PT Therapy Diagnosis : Difficulty walking PT Plan PT Frequency: 7X/week PT Recommendation Recommendations for Other Services: OT consult Follow Up Recommendations: Home health PT;Supervision/Assistance - 24 hour Equipment Recommended: Rolling walker with 5" wheels (will see how pt does w/ SW and if low enough for her height) PT Goals  Acute Rehab PT Goals PT Goal Formulation: With patient Time For Goal Achievement: 3 days Pt will go Supine/Side to Sit: with modified independence;with HOB 0 degrees PT Goal: Supine/Side to Sit - Progress: Goal set today Pt will go Sit to Supine/Side: with modified independence;with HOB 0 degrees PT Goal: Sit to Supine/Side - Progress: Goal set today Pt will go Sit to Stand: with supervision PT Goal: Sit to Stand - Progress: Goal set today Pt  will go Stand to Sit: with supervision PT Goal: Stand to Sit - Progress: Goal set today Pt will Ambulate: 51 - 150 feet;with supervision;with rolling walker PT Goal: Ambulate - Progress: Goal set today Pt will Go Up / Down Stairs: 1-2 stairs;with supervision;with least restrictive assistive device PT Goal: Up/Down Stairs - Progress: Goal set today Pt will Perform Home Exercise Program: with supervision, verbal cues required/provided PT Goal: Perform Home Exercise Program - Progress: Goal set today  PT Evaluation Precautions/Restrictions  Restrictions Weight Bearing Restrictions: No LLE Weight Bearing: Weight bearing as tolerated Prior Functioning  Home Living Lives With: Spouse Type of Home: House Home Layout: One level Home Access: Stairs to enter Entrance Stairs-Rails: None Entrance Stairs-Number of Steps: 1 Home Adaptive Equipment: Straight cane;Walker - standard;Bedside commode/3-in-1 Additional Comments: pt states her bed is tall and uses step ladder like to get into it, can sleep in another bed thatis lower Prior Function Level of Independence: Independent with basic ADLs Able to Take Stairs?: Yes Driving: Yes Vocation: Full time employment Cognition Cognition Orientation Level: Oriented X4 Sensation/Coordination   Extremity Assessment RLE Assessment RLE Assessment: Within Functional Limits LLE Assessment LLE Assessment: Exceptions to WFL LLE AROM (degrees) LLE Overall AROM Comments: tolerates 90 dgrees hip flex LLE Strength LLE Overall Strength Comments: able to advance LLE during gait Mobility (including Balance) Bed Mobility Bed Mobility: Yes Supine to Sit: 3: Mod assist;HOB elevated (Comment degrees) Supine to Sit Details (indicate cue type and reason): vc  on technique and used Sheet to move LLE to eob Transfers Transfers: Yes Sit to Stand: 4: Min assist;From bed;With upper extremity assist;From elevated surface Sit to Stand Details (indicate cue type and  reason): vc for WBS AT Stand to Sit: 4: Min assist;With upper extremity assist;To chair/3-in-1;Other (comment);Not tested (comment) Stand to Sit Details: vc for reach back to armrests Ambulation/Gait Ambulation/Gait: Yes Ambulation/Gait Assistance: 4: Min assist Ambulation/Gait Assistance Details (indicate cue type and reason): vc for sequence Ambulation Distance (Feet): 100 Feet Assistive device: Rolling walker Gait Pattern: Step-through pattern Gait velocity: wnl Stairs: No    Exercise pt performed Heel slides, SAQ, AA ABD/ADD x 10 reps using sheet to self assist   End of Session PT - End of Session Activity Tolerance: Patient tolerated treatment well Patient left: in chair;with call bell in reach Nurse Communication: Mobility status for transfers General Behavior During Session: [facility] for tasks performed  Rada Hay 06/26/2011, 9:40 AM  [phone]

## 2011-06-26 NOTE — Progress Notes (Signed)
Subjective: 1 Day Post-Op Procedure(s) (LRB): TOTAL HIP ARTHROPLASTY ANTERIOR APPROACH (Left)   Patient reports pain as mild.  Objective:   VITALS:   Filed Vitals:   06/26/11 0625  BP: 108/72  Pulse: 87  Temp: 97.7 F (36.5 C)  Resp: 16    Neurovascular intact Dorsiflexion/Plantar flexion intact Incision: dressing C/D/I No cellulitis present Compartment soft  LABS  Basename 06/26/11 0420  HGB 10.5*  HCT 32.3*  WBC 10.3  PLT 256     Basename 06/26/11 0420  NA 140  K 3.7  BUN 14  CREATININE 0.72  GLUCOSE 99     Assessment/Plan: 1 Day Post-Op Procedure(s) (LRB): TOTAL HIP ARTHROPLASTY ANTERIOR APPROACH (Left)   Foley cath d/c'ed HV drain d/c'ed Advance diet Up with therapy D/C IV fluids Plan for discharge tomorrow to home, if she continues to do well.   Anastasio Auerbach Melissa Leach   PAC  06/26/2011, 9:04 AM

## 2011-06-26 NOTE — Progress Notes (Signed)
CSW consulted to assist with d/c planning. PN reviewed. PT is recommending HHPT. Pt plans to return home following hospitalization. RNCM will assist with d/c planning home. CSW is available to assist with SNF placement if plan changes.  Cori Razor  LCSW  [phone]

## 2011-06-26 NOTE — Progress Notes (Signed)
OT Screen Order receive, chart reviewed. Spoke at length with pt who states she has all necessary DME from previous knee sxs and will have prn A from family.  Pt presents with no OT needs at this time. Will sign off.  Garrel Ridgel, OTR/L  Pager [phone] 06/26/2011

## 2011-06-26 NOTE — Progress Notes (Signed)
   CARE MANAGEMENT NOTE 06/26/2011  Patient:  ALVINE, MOSTAFA   Account Number:  [id number]  Date Initiated:  06/26/2011  Documentation initiated by:  Colleen Can  Subjective/Objective Assessment:   dx osteoarthritis left hip; anterior hip replacemnt     Action/Plan:   Cm spoke with patient. Plans are for patient to return to her home in [place] where spouse will be caregiver. Already has DME-crutches, walker, toilet seat, and cane.   Anticipated DC Date:  06/28/2011   Anticipated DC Plan:  HOME W HOME HEALTH SERVICES  In-house referral  NA      DC Planning Services  CM consult      [facility] Choice  HOME HEALTH   Choice offered to / List presented to:  C-1 Patient   DME arranged  NA      DME agency  NA     HH arranged  HH-2 PT      [facility] agency  [facility]   Status of service:  Completed, signed off Medicare Important Message given?  NO (If response is "NO", the following Medicare IM given date fields will be blank) Comments:  06/26/2011 Fabienne Nolasco,RN BSN CCM Choice offered for hh agency. Patient wants [facility] who is agency that was referred to by MD office.

## 2011-06-26 NOTE — Discharge Summary (Signed)
Physician Discharge Summary  Patient ID: Melissa Leach MRN: [id number] DOB/AGE: [birth year] 61 y.o.  Admit date: 06/25/2011 Discharge date: 06/27/2011  Procedures:  Procedure(s) (LRB): TOTAL HIP ARTHROPLASTY ANTERIOR APPROACH (Left)  Attending Physician:  Dr. Durene Romans   Admission Diagnoses:  Left hip OA and pain   Discharge Diagnoses:  Principal Problem:  *S/P left THA, AA Gastritis   H/O hiatal hernia   Headache - MIGRAINES   Arthritis   Anxiety   Depression   HPI: Pt is a 61 y.o. female complaining of left hip pain for years, increasing significantly in the last 6 months. Pain had continually increased since the beginning. X-rays in the clinic show end-stage arthritic changes of the left hip. Pt has tried various conservative treatments which have failed to alleviate their symptoms, including NSAIDs and trochanteric steroid injections. Various options are discussed with the patient. Risks, benefits and expectations were discussed with the patient. Patient understand the risks, benefits and expectations and wishes to proceed with surgery.   PCP: Emeterio [name], MD, MD   Discharged Condition: good  Hospital Course:  Patient underwent the above stated procedure on 06/25/2011. Patient tolerated the procedure well and brought to the recovery room in good condition and subsequently to the floor.  POD #1 BP: 108/72 ; Pulse: 87 ; Temp: 97.7 F (36.5 C) ; Resp: 16  Pt's foley was removed, as well as the hemovac drain removed. IV was changed to a saline lock. Patient reports pain as mild. No events. Neurovascular intact, dorsiflexion/plantar flexion intact, incision: dressing C/D/I, no cellulitis present and compartment soft.   LABS  Basename  06/26/11 0420   HGB  10.5  HCT  32.3   POD #2  BP: 123/76 ; Pulse: 88 ; Temp: 97.8 F (36.6 C) ; Resp: 16  Patient reports pain as mild. No events throughout the night. Ready for discharge home.  Neurovascular intact,  dorsiflexion/plantar flexion intact, incision: dressing C/D/I, no cellulitis present and compartment soft.   LABS  Basename  06/27/11 0436   HGB  11.0  HCT  34.1    Discharge Exam: General appearance: alert, cooperative and no distress Extremities: Homans sign is negative, no sign of DVT, no edema, redness or tenderness in the calves or thighs and no ulcers, gangrene or trophic changes  Disposition:   Home  with follow up in 2 weeks  Follow-up Information    Follow up with OLIN,Keeana Pieratt D in 2 weeks.   Contact information:   [facility] [address], [address] [place] [address] [phone]          Discharge Orders    Future Orders Please Complete By Expires   Diet - low sodium heart healthy      Call MD / Call 911      Comments:   If you experience chest pain or shortness of breath, CALL 911 and be transported to the hospital emergency room.  If you develope a fever above 101 F, pus (white drainage) or increased drainage or redness at the wound, or calf pain, call your surgeon's office.   Discharge instructions      Comments:   Maintain surgical dressing for 8 days, then replace with gauze and tape. Keep the area dry and clean until follow up. Follow up in 2 weeks at [facility]. Call with any questions or concerns.     Constipation Prevention      Comments:   Drink plenty of fluids.  Prune juice may  be helpful.  You may use a stool softener, such as Colace (over the counter) 100 mg twice a day.  Use MiraLax (over the counter) for constipation as needed.   Increase activity slowly as tolerated      Weight Bearing as taught in Physical Therapy      Comments:   Use a walker or crutches as instructed.   Driving restrictions      Comments:   No driving for 4 weeks   Change dressing      Comments:   Maintain surgical dressing for 8 days, then replace with 4x4 guaze and tape. Keep the area dry and clean.   TED hose       Comments:   Use stockings (TED hose) for 2 weeks on both leg(s).  You may remove them at night for sleeping.       Current Discharge Medication List    START taking these medications   Details  aspirin EC 325 MG tablet Take 1 tablet (325 mg total) by mouth 2 (two) times daily. X 4 weeks Qty: 60 tablet, Refills: 0    docusate sodium 100 MG CAPS Take 100 mg by mouth 2 (two) times daily.    ferrous sulfate 325 (65 FE) MG tablet Take 1 tablet (325 mg total) by mouth 3 (three) times daily after meals.    methocarbamol (ROBAXIN) 500 MG tablet Take 1 tablet (500 mg total) by mouth every 6 (six) hours as needed (muscle spasms).    oxyCODONE (OXY IR/ROXICODONE) 5 MG immediate release tablet Take 1-2 tablets (5-10 mg total) by mouth every 4 (four) hours as needed for pain. Qty: 120 tablet, Refills: 0    polyethylene glycol (MIRALAX / GLYCOLAX) packet Take 17 g by mouth daily as needed.      CONTINUE these medications which have NOT CHANGED   Details  atorvastatin (LIPITOR) 40 MG tablet Take 20 mg by mouth every evening.    buPROPion (WELLBUTRIN XL) 300 MG 24 hr tablet Take 300 mg by mouth every evening.    omeprazole (PRILOSEC) 40 MG capsule Take 40 mg by mouth every evening.    topiramate (TOPAMAX) 50 MG tablet Take 100 mg by mouth every evening.    acetaminophen (TYLENOL) 500 MG tablet Take 1,000 mg by mouth every 6 (six) hours as needed. FOR HIP PAIN    Calcium Carbonate-Vitamin D (CALCIUM + D) 600-200 MG-UNIT TABS Take 2 tablets by mouth every evening.    LORazepam (ATIVAN) 0.5 MG tablet Take 0.5 mg by mouth every 8 (eight) hours as needed. For anxiety/sleep     Multiple Vitamin (MULITIVITAMIN WITH MINERALS) TABS Take 1 tablet by mouth every evening.    omega-3 acid ethyl esters (LOVAZA) 1 G capsule Take 2 g by mouth every evening.    SUMAtriptan (IMITREX) 100 MG tablet Take 100 mg by mouth every 2 (two) hours as needed. For migraine      STOP taking these medications      meloxicam (MOBIC) 15 MG tablet Comments:  Reason for Stopping:       OXYCODONE-ACETAMINOPHEN PO Comments:  Reason for Stopping:           Signed:  Anastasio Auerbach. Rayven Hendrickson   PAC  06/27/2011, 8:46 AM

## 2011-06-27 LAB — CBC
HCT: 34.1 % — ABNORMAL LOW (ref 36.0–46.0)
MCV: 96.6 fL (ref 78.0–100.0)
Platelets: 269 10*3/uL (ref 150–400)
RBC: 3.53 MIL/uL — ABNORMAL LOW (ref 3.87–5.11)
WBC: 10.8 10*3/uL — ABNORMAL HIGH (ref 4.0–10.5)

## 2011-06-27 MED ORDER — OXYCODONE HCL 5 MG PO TABS: 5.0000 mg | ORAL_TABLET | ORAL | Status: AC | PRN

## 2011-06-27 NOTE — Progress Notes (Signed)
Physical Therapy Treatment Patient Details Name: Melissa Leach MRN: [id number] DOB: [birth year] Today's Date: 06/27/2011 1025-1039 PT Assessment/Plan  PT - Assessment/Plan Comments on Treatment Session: pt is ready for DC today PT Goals  Acute Rehab PT Goals Pt will Go Up / Down Stairs: 1-2 stairs;with supervision;with least restrictive assistive device PT Goal: Up/Down Stairs - Progress: Met  PT Treatment Precautions/Restrictions  Restrictions Weight Bearing Restrictions: No LLE Weight Bearing: Weight bearing as tolerated Mobility (including Balance) Transfers Sit to Stand: 5: Supervision;From chair/3-in-1;With armrests Stand to Sit: 5: Supervision;To chair/3-in-1;With armrests Ambulation/Gait Ambulation/Gait Assistance: 5: Supervision Ambulation/Gait Assistance Details (indicate cue type and reason): pt c/o one stab bing discomfort of L lat thigh. Dr. Charlann Boxer stated mos likely a tight muscl Ambulation Distance (Feet): 50 Feet Assistive device: Rolling walker Gait Pattern: Step-through pattern Stairs: Yes Stairs Assistance: 4: Min assist Stairs Assistance Details (indicate cue type and reason): spouse present for instruction on 1 step Stair Management Technique: No rails;Backwards;With walker Number of Stairs: 1  Height of Stairs: 8     Exercise    End of Session PT - End of Session Activity Tolerance: Patient tolerated treatment well  Melissa Leach 06/27/2011, 3:17 PM

## 2011-06-27 NOTE — Progress Notes (Signed)
D/c instructions given and explained to pt.  Prescription for oxycodone given to pt, placed in pt's hands.  Iv site d/c, pressure dressing placed over site.   Pt has equipment and home health is set up with [place].  Pt stable and ready for d/c.  Pt d/c home at this time with husband via car.

## 2011-06-27 NOTE — Progress Notes (Signed)
Subjective: 2 Days Post-Op Procedure(s) (LRB): TOTAL HIP ARTHROPLASTY ANTERIOR APPROACH (Left)   Patient reports pain as mild. No events throughout the night. Ready for discharge home.  Objective:   VITALS:   Filed Vitals:   06/27/11 0447  BP: 123/76  Pulse: 88  Temp: 97.8 F (36.6 C)  Resp: 16    Neurovascular intact Dorsiflexion/Plantar flexion intact Incision: dressing C/D/I No cellulitis present Compartment soft  LABS  Basename 06/27/11 0436 06/26/11 0420  HGB 11.0* 10.5*  HCT 34.1* 32.3*  WBC 10.8* 10.3  PLT 269 256     Basename 06/26/11 0420  NA 140  K 3.7  BUN 14  CREATININE 0.72  GLUCOSE 99     Assessment/Plan: 2 Days Post-Op Procedure(s) (LRB): TOTAL HIP ARTHROPLASTY ANTERIOR APPROACH (Left)   Up with therapy Discharge home with home health Follow up in 2 weeks at [facility].  Follow-up Information    Follow up with OLIN,Dushaun Okey D in 2 weeks.   Contact information:   [facility] [address], [address] [place] [address] [phone]          Anastasio Auerbach. Bryce Cheever   PAC  06/27/2011, 8:41 AM

## 2011-06-27 NOTE — Progress Notes (Signed)
Physical Therapy Treatment Patient Details Name: Melissa Leach MRN: [id number] DOB: 1951-01-10 Today's Date: 06/27/[phone]  PT Assessment/Plan  PT - Assessment/Plan Comments on Treatment Session: pt  will need to practice w/ spouse fior 1 step. progressisng well Follow Up Recommendations: Home health PT PT Goals  Acute Rehab PT Goals Pt will go Supine/Side to Sit: with modified independence;with HOB 0 degrees PT Goal: Supine/Side to Sit - Progress: Progressing toward goal Pt will go Sit to Supine/Side: with modified independence;with HOB 0 degrees PT Goal: Sit to Supine/Side - Progress: Progressing toward goal Pt will go Sit to Stand: with supervision PT Goal: Sit to Stand - Progress: Met Pt will go Stand to Sit: with supervision PT Goal: Stand to Sit - Progress: Met Pt will Ambulate: 51 - 150 feet;with supervision;with rolling walker PT Goal: Ambulate - Progress: Met Pt will Go Up / Down Stairs: 1-2 stairs;with supervision;with least restrictive assistive device Pt will Perform Home Exercise Program: with supervision, verbal cues required/provided PT Goal: Perform Home Exercise Program - Progress: Met  PT Treatment Precautions/Restrictions  Restrictions Weight Bearing Restrictions: No LLE Weight Bearing: Weight bearing as tolerated Mobility (including Balance) Bed Mobility Supine to Sit: 5: Supervision;HOB flat Supine to Sit Details (indicate cue type and reason): used leglifter for LLE Sit to Supine:  (discussed options due to pt's bed is very high) Transfers Sit to Stand: 5: Supervision;From elevated surface;From bed Stand to Sit: 5: Supervision Stand to Sit Details: worked from  high bed using step, pt instructed to step up w/ RLE as pt has a 2 step stool. also rec. that she may want to stay in a bed that is lower. Ambulation/Gait Ambulation/Gait: Yes Ambulation/Gait Assistance: 5: Supervision Ambulation/Gait Assistance Details (indicate cue type and reason): pt  c/o one stab bing discomfort of L lat thigh. Dr. Charlann Boxer stated mos likely a tight muscle Ambulation Distance (Feet): 75 Feet Assistive device: Rolling walker Gait Pattern: Step-through pattern Gait velocity: wnl    Exercise  Total Joint Exercises Ankle Circles/Pumps: AROM;Left Quad Sets: AROM;Both;20 reps Short Arc Quad: AROM;Left;10 reps Heel Slides: AAROM;Left;10 reps Hip ABduction/ADduction: AAROM;Left;10 reps Long Arc Quad: AROM;Left;10 reps End of Session PT - End of Session Activity Tolerance: Patient tolerated treatment well Patient left: in chair;with call bell in reach Nurse Communication: Mobility status for transfers General Behavior During Session: [facility] for tasks performed  Rada Hay 06/27/2011, 1:06 PM

## 2011-06-27 NOTE — Progress Notes (Signed)
06/27/2011 Raynelle Bring BSN CCM [phone] Pt discharged today with [facility] agency to start services tomorrow 06/28/2011.

## 2011-06-28 ENCOUNTER — Encounter (HOSPITAL_COMMUNITY): Payer: Self-pay | Admitting: Orthopedic Surgery

## 2011-09-06 ENCOUNTER — Ambulatory Visit (HOSPITAL_COMMUNITY)
Admission: RE | Admit: 2011-09-06 | Discharge: 2011-09-06 | Disposition: A | Discharge status: Home / Self Care | Payer: BC Managed Care – PPO | Source: Ambulatory Visit | Attending: Orthopedic Surgery | Admitting: Orthopedic Surgery

## 2011-09-06 DIAGNOSIS — Z96649 Presence of unspecified artificial hip joint: Secondary | ICD-10-CM

## 2011-09-06 DIAGNOSIS — M7989 Other specified soft tissue disorders: Secondary | ICD-10-CM

## 2011-09-06 NOTE — Progress Notes (Signed)
*  PRELIMINARY RESULTS* Vascular Ultrasound Lower extremity venous duplex has been completed.  Preliminary findings: Bilaterally no evidence of DVT.  Farrel Demark, RDMS 09/06/2011, 3:55 PM

## 2011-10-10 ENCOUNTER — Other Ambulatory Visit (HOSPITAL_COMMUNITY): Payer: Self-pay | Admitting: Orthopedic Surgery

## 2011-10-10 DIAGNOSIS — M25561 Pain in right knee: Secondary | ICD-10-CM

## 2011-10-11 ENCOUNTER — Other Ambulatory Visit (HOSPITAL_COMMUNITY): Payer: BC Managed Care – PPO

## 2011-10-14 ENCOUNTER — Encounter (HOSPITAL_COMMUNITY)
Admission: RE | Admit: 2011-10-14 | Discharge: 2011-10-14 | Disposition: A | Discharge status: Home / Self Care | Payer: BC Managed Care – PPO | Source: Ambulatory Visit | Attending: Orthopedic Surgery | Admitting: Orthopedic Surgery

## 2011-10-14 ENCOUNTER — Ambulatory Visit (HOSPITAL_COMMUNITY)
Admission: RE | Admit: 2011-10-14 | Discharge: 2011-10-14 | Disposition: A | Discharge status: Home / Self Care | Payer: BC Managed Care – PPO | Source: Ambulatory Visit | Attending: Orthopedic Surgery | Admitting: Orthopedic Surgery

## 2011-10-14 DIAGNOSIS — M25569 Pain in unspecified knee: Secondary | ICD-10-CM | POA: Insufficient documentation

## 2011-10-14 DIAGNOSIS — M25561 Pain in right knee: Secondary | ICD-10-CM

## 2011-10-14 DIAGNOSIS — Z96659 Presence of unspecified artificial knee joint: Secondary | ICD-10-CM | POA: Insufficient documentation

## 2011-10-14 DIAGNOSIS — M25469 Effusion, unspecified knee: Secondary | ICD-10-CM | POA: Insufficient documentation

## 2011-10-14 MED ORDER — TECHNETIUM TC 99M MEDRONATE IV KIT
25.0000 | PACK | Freq: Once | INTRAVENOUS | Status: AC | PRN
  Administered 2011-10-14: 25 via INTRAVENOUS

## 2011-10-15 ENCOUNTER — Inpatient Hospital Stay: Admit: 2011-10-15 | Payer: BC Managed Care – PPO | Admitting: Orthopedic Surgery

## 2011-10-15 SURGERY — ARTHROPLASTY, HIP, TOTAL, ANTERIOR APPROACH
Anesthesia: Choice | Site: Hip | Laterality: Right

## 2011-10-21 ENCOUNTER — Encounter (HOSPITAL_COMMUNITY): Payer: Self-pay | Admitting: Pharmacy Technician

## 2011-10-22 ENCOUNTER — Encounter (HOSPITAL_COMMUNITY)
Admission: RE | Admit: 2011-10-22 | Discharge: 2011-10-22 | Disposition: A | Discharge status: Home / Self Care | Payer: BC Managed Care – PPO | Source: Ambulatory Visit | Attending: Orthopedic Surgery | Admitting: Orthopedic Surgery

## 2011-10-22 ENCOUNTER — Encounter (HOSPITAL_COMMUNITY): Payer: Self-pay

## 2011-10-22 LAB — URINALYSIS, ROUTINE W REFLEX MICROSCOPIC
Ketones, ur: NEGATIVE mg/dL
Nitrite: NEGATIVE
Protein, ur: NEGATIVE mg/dL
Urobilinogen, UA: 0.2 mg/dL (ref 0.0–1.0)

## 2011-10-22 LAB — URINE MICROSCOPIC-ADD ON

## 2011-10-22 LAB — DIFFERENTIAL
Basophils Absolute: 0.1 10*3/uL (ref 0.0–0.1)
Eosinophils Absolute: 0.2 10*3/uL (ref 0.0–0.7)
Eosinophils Relative: 3 % (ref 0–5)
Lymphocytes Relative: 26 % (ref 12–46)
Lymphs Abs: 1.6 10*3/uL (ref 0.7–4.0)
Neutrophils Relative %: 63 % (ref 43–77)

## 2011-10-22 LAB — APTT: aPTT: 34 seconds (ref 24–37)

## 2011-10-22 LAB — CBC
Platelets: 335 10*3/uL (ref 150–400)
RBC: 4.16 MIL/uL (ref 3.87–5.11)
RDW: 13.8 % (ref 11.5–15.5)
WBC: 6 10*3/uL (ref 4.0–10.5)

## 2011-10-22 LAB — BASIC METABOLIC PANEL
Calcium: 9.8 mg/dL (ref 8.4–10.5)
Chloride: 104 mEq/L (ref 96–112)
Creatinine, Ser: 0.88 mg/dL (ref 0.50–1.10)
GFR calc Af Amer: 82 mL/min — ABNORMAL LOW (ref >90)
Sodium: 138 mEq/L (ref 135–145)

## 2011-10-22 LAB — PROTIME-INR: INR: 1.02 (ref 0.00–1.49)

## 2011-10-22 NOTE — Pre-Procedure Instructions (Signed)
Faxed note to Dr. Charlann Boxer to review BUN results from [facility] and Routine urine with mircoscopic results. Fax confirmation sheet placed in chart.

## 2011-10-22 NOTE — Pre-Procedure Instructions (Signed)
Reviewed with patient pre-op instructions using Teach back method. 

## 2011-10-22 NOTE — Patient Instructions (Addendum)
20 Olla Delancey  10/22/2011   Your procedure is scheduled on:  Monday 10/28/2011 at 225 pm  Report to [facility] at 1145 am  Call this number if you have problems the morning of surgery: [phone]   Remember:   Do not eat food:After Midnight.  May have clear liquids: up to 4 Hours before arrival- may have clear liquids from midnight up until 0845 am morning of surgery  Clear liquids include soda, tea, black coffee, apple or grape juice, broth.  Take these medicines the morning of surgery with A SIP OF WATER: NONE   Do not wear jewelry  Do not wear lotions, powders, or perfumes.   Do not shave 48 hours prior to surgery. Men may shave face and neck.  Do not bring valuables to the hospital.  Contacts, dentures or bridgework may not be worn into surgery.  Leave suitcase in the car. After surgery it may be brought to your room.  For patients admitted to the hospital, checkout time is 11:00 AM the day of discharge.       Special Instructions: CHG Shower Use Special Wash: 1/2 bottle night before surgery and 1/2 bottle morning of surgery.   Please read over the following fact sheets that you were given: MRSA Information, Sleep apnea sheet, Incentive Spirometry sheet , Blood Transfusion sheet                 If you have any questions, please call me at [phone] [facility].Georgeanna Lea, RN,BSN

## 2011-10-23 NOTE — H&P (Signed)
Melissa Leach is an 61 y.o. female.    Chief Complaint:  Right knee pain   HPI: Pt is a 61 y.o. female complaining of right knee pain for years. She had her original total knee arthroplasty in 2001. Do to some pain and after an examination it was determined that the femoral component had loosened and she had a revision of the femoral component in 2002 at [facility]. Pain had continually increased since the beginning. Once she recovered from other surgeries including a left total hip arthroplasty, her pain persisted.  Studies reveal possible loosening of the tibial component of the right knee. Various options are discussed with the patient. Risks, benefits and expectations were discussed with the patient. Patient understand the risks, benefits and expectations and wishes to proceed with surgery.   PCP:  Emeterio [name], MD  D/C Plans:  Home with HHPT  Post-op Meds:   Rx given for ASA, Robaxin, Iron, Colace and MiraLax  Tranexamic Acid:   To be given  Decadron:   To be given  PMH: Past Medical History  Diagnosis Date  . Gastritis     HISTORY OF GASTRITIS--OCCAS ABDOMINAL PAIN   . H/O hiatal hernia   . Headache     MIGRAINES  . Arthritis     PAIN IN BOTH HIPS AND KNEES  . Anxiety   . Depression   . Complication of anesthesia     "SCRATCHED CORNEA"  WAKING UP FROM  KNEE SURGERY-    PSH: Past Surgical History  Procedure Date  . Joint replacement     RIGHT TOTAL KNEE REPLACEMENT AND RT TOTAL KNEE REVISION    AND  LEFT TOTAL KNEE REPLACEMENT  . Tubal ligation   . Tonsillectomy   . Total hip arthroplasty 06/25/2011    Procedure: TOTAL HIP ARTHROPLASTY ANTERIOR APPROACH;  Surgeon: [name], MD;  Location: [facility] ORS;  Service: Orthopedics;  Laterality: Left;  Marland Kitchen Eye surgery 1994    RA surgery-    Social History:  reports that she has quit smoking. She quit smokeless tobacco use about 35 years ago. She reports that she does not drink alcohol or use illicit drugs.  Allergies:    Allergies  Allergen Reactions  . Sulfa Antibiotics Rash    Medications: No current facility-administered medications for this encounter.   Current Outpatient Prescriptions  Medication Sig Dispense Refill  . acetaminophen (TYLENOL) 500 MG tablet Take 1,000 mg by mouth every 6 (six) hours as needed. FOR HIP PAIN      . amoxicillin (AMOXIL) 500 MG capsule Take 2,000 mg by mouth once. Prior to dental procedure      . atorvastatin (LIPITOR) 40 MG tablet Take 20 mg by mouth every evening.      Marland Kitchen buPROPion (WELLBUTRIN XL) 300 MG 24 hr tablet Take 300 mg by mouth every evening.      . Calcium Carbonate-Vitamin D (CALCIUM + D) 600-200 MG-UNIT TABS Take 2 tablets by mouth every evening.      Marland Kitchen HYDROcodone-acetaminophen (NORCO/VICODIN) 5-325 MG per tablet Take 1 tablet by mouth every 8 (eight) hours as needed. For pain      . LORazepam (ATIVAN) 0.5 MG tablet Take 0.5 mg by mouth every 8 (eight) hours as needed. For anxiety/sleep      . meloxicam (MOBIC) 15 MG tablet Take 15 mg by mouth every evening.      . Multiple Vitamin (MULITIVITAMIN WITH MINERALS) TABS Take 1 tablet by mouth every evening.      Marland Kitchen  omega-3 acid ethyl esters (LOVAZA) 1 G capsule Take 2 g by mouth every evening.      Marland Kitchen omeprazole (PRILOSEC) 40 MG capsule Take 40 mg by mouth every evening.      . SUMAtriptan (IMITREX) 100 MG tablet Take 100 mg by mouth every 2 (two) hours as needed. For migraine      . topiramate (TOPAMAX) 50 MG tablet Take 100 mg by mouth every evening.        Results for orders placed during the hospital encounter of 10/22/11 (from the past 48 hour(s))  URINALYSIS, ROUTINE W REFLEX MICROSCOPIC     Status: Abnormal   Collection Time   10/22/11 10:30 AM      Component Value Range Comment   Color, Urine YELLOW  YELLOW    APPearance CLEAR  CLEAR    Specific Gravity, Urine 1.018  1.005 - 1.030    pH 6.5  5.0 - 8.0    Glucose, UA NEGATIVE  NEGATIVE mg/dL    Hgb urine dipstick TRACE (*) NEGATIVE    Bilirubin  Urine NEGATIVE  NEGATIVE    Ketones, ur NEGATIVE  NEGATIVE mg/dL    Protein, ur NEGATIVE  NEGATIVE mg/dL    Urobilinogen, UA 0.2  0.0 - 1.0 mg/dL    Nitrite NEGATIVE  NEGATIVE    Leukocytes, UA SMALL (*) NEGATIVE   URINE MICROSCOPIC-ADD ON     Status: Abnormal   Collection Time   10/22/11 10:30 AM      Component Value Range Comment   Squamous Epithelial / LPF FEW (*) RARE    WBC, UA 3-6  <3 WBC/hpf    RBC / HPF 3-6  <3 RBC/hpf    Bacteria, UA RARE  RARE   DIFFERENTIAL     Status: Normal   Collection Time   10/22/11 10:45 AM      Component Value Range Comment   Neutrophils Relative 63  43 - 77 %    Neutro Abs 3.8  1.7 - 7.7 K/uL    Lymphocytes Relative 26  12 - 46 %    Lymphs Abs 1.6  0.7 - 4.0 K/uL    Monocytes Relative 7  3 - 12 %    Monocytes Absolute 0.4  0.1 - 1.0 K/uL    Eosinophils Relative 3  0 - 5 %    Eosinophils Absolute 0.2  0.0 - 0.7 K/uL    Basophils Relative 1  0 - 1 %    Basophils Absolute 0.1  0.0 - 0.1 K/uL   APTT     Status: Normal   Collection Time   10/22/11 10:45 AM      Component Value Range Comment   aPTT 34  24 - 37 seconds   BASIC METABOLIC PANEL     Status: Abnormal   Collection Time   10/22/11 10:45 AM      Component Value Range Comment   Sodium 138  135 - 145 mEq/L    Potassium 4.1  3.5 - 5.1 mEq/L    Chloride 104  96 - 112 mEq/L    CO2 25  19 - 32 mEq/L    Glucose, Bld 80  70 - 99 mg/dL    BUN 29 (*) 6 - 23 mg/dL    Creatinine, Ser 4.78  0.50 - 1.10 mg/dL    Calcium 9.8  8.4 - 29.5 mg/dL    GFR calc non Af Amer 71 (*) >90 mL/min    GFR calc Af Amer 82 (*) >  90 mL/min   CBC     Status: Normal   Collection Time   10/22/11 10:45 AM      Component Value Range Comment   WBC 6.0  4.0 - 10.5 K/uL    RBC 4.16  3.87 - 5.11 MIL/uL    Hemoglobin 12.6  12.0 - 15.0 g/dL    HCT 11.9  14.7 - 82.9 %    MCV 91.6  78.0 - 100.0 fL    MCH 30.3  26.0 - 34.0 pg    MCHC 33.1  30.0 - 36.0 g/dL    RDW 56.2  13.0 - 86.5 %    Platelets 335  150 - 400 K/uL     PROTIME-INR     Status: Normal   Collection Time   10/22/11 10:45 AM      Component Value Range Comment   Prothrombin Time 13.6  11.6 - 15.2 seconds    INR 1.02  0.00 - 1.49   SURGICAL PCR SCREEN     Status: Normal   Collection Time   10/22/11 11:43 AM      Component Value Range Comment   MRSA, PCR NEGATIVE  NEGATIVE    Staphylococcus aureus NEGATIVE  NEGATIVE      ROS: Review of Systems  Constitutional: Negative.   HENT: Negative.   Eyes: Negative.   Respiratory: Negative.   Cardiovascular: Negative.   Gastrointestinal: Negative.   Genitourinary: Negative.   Musculoskeletal: Positive for joint pain.  Skin: Negative.   Neurological: Negative.   Endo/Heme/Allergies: Negative.   Psychiatric/Behavioral: Negative.      Physical Exam: BP: 117/67 ; HR: 88 ; Resp: 18 Physical Exam  Constitutional: She is oriented to person, place, and time and well-developed, well-nourished, and in no distress.  HENT:  Head: Normocephalic and atraumatic.  Nose: Nose normal.  Mouth/Throat: Oropharynx is clear and moist.  Eyes: Pupils are equal, round, and reactive to light.  Neck: Neck supple. No JVD present. No tracheal deviation present. No thyromegaly present.  Cardiovascular: Normal rate, regular rhythm, normal heart sounds and intact distal pulses.   Pulmonary/Chest: Effort normal and breath sounds normal. No stridor. No respiratory distress. She has no wheezes. She has no rales. She exhibits no tenderness.  Abdominal: Soft. There is no tenderness. There is no guarding.  Musculoskeletal:       Right knee: She exhibits decreased range of motion (0-90), swelling, laceration (scar from previous incision) and bony tenderness. She exhibits no effusion, no ecchymosis and no deformity. tenderness found.  Lymphadenopathy:    She has no cervical adenopathy.  Neurological: She is alert and oriented to person, place, and time.  Skin: Skin is warm and dry.  Psychiatric: Affect normal.      Assessment/Plan Assessment: right knee pain, s/p total knee arthroplasty with possible loosening of the tibial component   Plan: Patient will undergo a right total knee revision on 10/28/2011 per Dr. Charlann Boxer at [facility]. Risks benefits and expectation were discussed with the patient. Patient understand risks, benefits and expectation and wishes to proceed.   Anastasio Auerbach Lillyth Spong   PAC  10/23/2011, 5:51 PM

## 2011-10-28 ENCOUNTER — Ambulatory Visit (HOSPITAL_COMMUNITY): Payer: BC Managed Care – PPO

## 2011-10-28 ENCOUNTER — Encounter (HOSPITAL_COMMUNITY): Payer: Self-pay | Admitting: Anesthesiology

## 2011-10-28 ENCOUNTER — Encounter (HOSPITAL_COMMUNITY)
Admission: RE | Disposition: A | Discharge status: Home Health | Payer: Self-pay | Source: Ambulatory Visit | Attending: Orthopedic Surgery

## 2011-10-28 ENCOUNTER — Ambulatory Visit (HOSPITAL_COMMUNITY): Payer: BC Managed Care – PPO | Admitting: Anesthesiology

## 2011-10-28 ENCOUNTER — Encounter (HOSPITAL_COMMUNITY): Payer: Self-pay | Admitting: *Deleted

## 2011-10-28 DIAGNOSIS — Z882 Allergy status to sulfonamides status: Secondary | ICD-10-CM

## 2011-10-28 DIAGNOSIS — Z96659 Presence of unspecified artificial knee joint: Secondary | ICD-10-CM

## 2011-10-28 DIAGNOSIS — Y92009 Unspecified place in unspecified non-institutional (private) residence as the place of occurrence of the external cause: Secondary | ICD-10-CM

## 2011-10-28 DIAGNOSIS — T84039A Mechanical loosening of unspecified internal prosthetic joint, initial encounter: Principal | ICD-10-CM | POA: Diagnosis present

## 2011-10-28 DIAGNOSIS — Y831 Surgical operation with implant of artificial internal device as the cause of abnormal reaction of the patient, or of later complication, without mention of misadventure at the time of the procedure: Secondary | ICD-10-CM | POA: Diagnosis present

## 2011-10-28 DIAGNOSIS — F411 Generalized anxiety disorder: Secondary | ICD-10-CM | POA: Diagnosis present

## 2011-10-28 DIAGNOSIS — F3289 Other specified depressive episodes: Secondary | ICD-10-CM | POA: Diagnosis present

## 2011-10-28 DIAGNOSIS — F329 Major depressive disorder, single episode, unspecified: Secondary | ICD-10-CM | POA: Diagnosis present

## 2011-10-28 DIAGNOSIS — Z87891 Personal history of nicotine dependence: Secondary | ICD-10-CM

## 2011-10-28 DIAGNOSIS — Z96649 Presence of unspecified artificial hip joint: Secondary | ICD-10-CM

## 2011-10-28 HISTORY — PX: TOTAL KNEE REVISION: SHX996

## 2011-10-28 LAB — TYPE AND SCREEN: ABO/RH(D): O NEG

## 2011-10-28 SURGERY — TOTAL KNEE REVISION
Anesthesia: General | Site: Knee | Laterality: Right | Wound class: Clean

## 2011-10-28 MED ORDER — HYDROMORPHONE HCL PF 1 MG/ML IJ SOLN
0.2500 mg | INTRAMUSCULAR | Status: DC | PRN
  Administered 2011-10-28 (×2): 0.5 mg via INTRAVENOUS

## 2011-10-28 MED ORDER — TOPIRAMATE 100 MG PO TABS
100.0000 mg | ORAL_TABLET | Freq: Every evening | ORAL | Status: DC
  Administered 2011-10-28 – 2011-10-30 (×3): 100 mg via ORAL
  Filled 2011-10-28 (×4): qty 1

## 2011-10-28 MED ORDER — HYDROMORPHONE HCL PF 1 MG/ML IJ SOLN
INTRAMUSCULAR | Status: AC
  Administered 2011-10-28: 17:00:00
  Filled 2011-10-28: qty 1

## 2011-10-28 MED ORDER — ALUM & MAG HYDROXIDE-SIMETH 200-200-20 MG/5ML PO SUSP: 30.0000 mL | ORAL | Status: DC | PRN

## 2011-10-28 MED ORDER — DEXAMETHASONE SODIUM PHOSPHATE 10 MG/ML IJ SOLN
10.0000 mg | Freq: Once | INTRAMUSCULAR | Status: AC
  Administered 2011-10-28: 10 mg via INTRAVENOUS

## 2011-10-28 MED ORDER — HYDROMORPHONE HCL PF 1 MG/ML IJ SOLN
0.5000 mg | INTRAMUSCULAR | Status: DC | PRN
  Administered 2011-10-28 – 2011-10-30 (×5): 1 mg via INTRAVENOUS
  Filled 2011-10-28 (×5): qty 1

## 2011-10-28 MED ORDER — FLEET ENEMA 7-19 GM/118ML RE ENEM: 1.0000 | ENEMA | Freq: Once | RECTAL | Status: AC | PRN

## 2011-10-28 MED ORDER — SODIUM CHLORIDE 0.9 % IR SOLN
Status: DC | PRN
  Administered 2011-10-28: 3000 mL

## 2011-10-28 MED ORDER — METOCLOPRAMIDE HCL 5 MG/ML IJ SOLN: 5.0000 mg | Freq: Three times a day (TID) | INTRAMUSCULAR | Status: DC | PRN

## 2011-10-28 MED ORDER — PROMETHAZINE HCL 25 MG/ML IJ SOLN: 6.2500 mg | INTRAMUSCULAR | Status: DC | PRN

## 2011-10-28 MED ORDER — PANTOPRAZOLE SODIUM 40 MG PO TBEC
80.0000 mg | DELAYED_RELEASE_TABLET | Freq: Every day | ORAL | Status: DC
  Administered 2011-10-29 – 2011-10-31 (×3): 80 mg via ORAL
  Filled 2011-10-28 (×3): qty 2

## 2011-10-28 MED ORDER — ONDANSETRON HCL 4 MG/2ML IJ SOLN: 4.0000 mg | Freq: Four times a day (QID) | INTRAMUSCULAR | Status: DC | PRN

## 2011-10-28 MED ORDER — CEFAZOLIN SODIUM-DEXTROSE 2-3 GM-% IV SOLR
2.0000 g | Freq: Four times a day (QID) | INTRAVENOUS | Status: AC
  Administered 2011-10-28 – 2011-10-29 (×2): 2 g via INTRAVENOUS
  Filled 2011-10-28 (×2): qty 50

## 2011-10-28 MED ORDER — ACETAMINOPHEN 10 MG/ML IV SOLN
INTRAVENOUS | Status: AC
  Filled 2011-10-28: qty 100

## 2011-10-28 MED ORDER — PROPOFOL 10 MG/ML IV EMUL
INTRAVENOUS | Status: DC | PRN
  Administered 2011-10-28: 125 ug/kg/min via INTRAVENOUS

## 2011-10-28 MED ORDER — METOCLOPRAMIDE HCL 10 MG PO TABS: 5.0000 mg | ORAL_TABLET | Freq: Three times a day (TID) | ORAL | Status: DC | PRN

## 2011-10-28 MED ORDER — ZOLPIDEM TARTRATE 5 MG PO TABS: 5.0000 mg | ORAL_TABLET | Freq: Every evening | ORAL | Status: DC | PRN

## 2011-10-28 MED ORDER — METHOCARBAMOL 500 MG PO TABS
500.0000 mg | ORAL_TABLET | Freq: Four times a day (QID) | ORAL | Status: DC | PRN
  Administered 2011-10-29 – 2011-10-31 (×6): 500 mg via ORAL
  Filled 2011-10-28 (×6): qty 1

## 2011-10-28 MED ORDER — SUMATRIPTAN SUCCINATE 100 MG PO TABS
100.0000 mg | ORAL_TABLET | ORAL | Status: DC | PRN
  Filled 2011-10-28: qty 1

## 2011-10-28 MED ORDER — LACTATED RINGERS IV SOLN
INTRAVENOUS | Status: DC
  Administered 2011-10-28: 1000 mL via INTRAVENOUS
  Administered 2011-10-28 (×3): via INTRAVENOUS

## 2011-10-28 MED ORDER — RIVAROXABAN 10 MG PO TABS
10.0000 mg | ORAL_TABLET | ORAL | Status: DC
  Administered 2011-10-29 – 2011-10-31 (×3): 10 mg via ORAL
  Filled 2011-10-28 (×4): qty 1

## 2011-10-28 MED ORDER — PHENOL 1.4 % MT LIQD: 1.0000 | OROMUCOSAL | Status: DC | PRN

## 2011-10-28 MED ORDER — DOCUSATE SODIUM 100 MG PO CAPS
100.0000 mg | ORAL_CAPSULE | Freq: Two times a day (BID) | ORAL | Status: DC
  Administered 2011-10-28 – 2011-10-31 (×6): 100 mg via ORAL

## 2011-10-28 MED ORDER — KETOROLAC TROMETHAMINE 30 MG/ML IJ SOLN
INTRAMUSCULAR | Status: AC
  Filled 2011-10-28: qty 1

## 2011-10-28 MED ORDER — ONDANSETRON HCL 4 MG PO TABS: 4.0000 mg | ORAL_TABLET | Freq: Four times a day (QID) | ORAL | Status: DC | PRN

## 2011-10-28 MED ORDER — CEFAZOLIN SODIUM-DEXTROSE 2-3 GM-% IV SOLR
2.0000 g | Freq: Four times a day (QID) | INTRAVENOUS | Status: DC
  Filled 2011-10-28 (×2): qty 50

## 2011-10-28 MED ORDER — BISACODYL 5 MG PO TBEC: 5.0000 mg | DELAYED_RELEASE_TABLET | Freq: Every day | ORAL | Status: DC | PRN

## 2011-10-28 MED ORDER — MIDAZOLAM HCL 5 MG/5ML IJ SOLN
INTRAMUSCULAR | Status: DC | PRN
  Administered 2011-10-28 (×2): 1 mg via INTRAVENOUS
  Administered 2011-10-28: 2 mg via INTRAVENOUS

## 2011-10-28 MED ORDER — KETOROLAC TROMETHAMINE 30 MG/ML IJ SOLN
INTRAMUSCULAR | Status: DC | PRN
  Administered 2011-10-28: 30 mg via INTRAVENOUS

## 2011-10-28 MED ORDER — CEFAZOLIN SODIUM-DEXTROSE 2-3 GM-% IV SOLR
INTRAVENOUS | Status: AC
  Filled 2011-10-28: qty 50

## 2011-10-28 MED ORDER — DEXAMETHASONE SODIUM PHOSPHATE 10 MG/ML IJ SOLN
10.0000 mg | Freq: Once | INTRAMUSCULAR | Status: AC
  Administered 2011-10-29: 10 mg via INTRAVENOUS
  Filled 2011-10-28: qty 1

## 2011-10-28 MED ORDER — HYDROCODONE-ACETAMINOPHEN 7.5-325 MG PO TABS
1.0000 | ORAL_TABLET | ORAL | Status: DC
  Administered 2011-10-28: 2 via ORAL
  Administered 2011-10-28 – 2011-10-29 (×2): 1 via ORAL
  Administered 2011-10-29: 2 via ORAL
  Filled 2011-10-28 (×4): qty 2

## 2011-10-28 MED ORDER — PHENYLEPHRINE HCL 10 MG/ML IJ SOLN
INTRAMUSCULAR | Status: DC | PRN
  Administered 2011-10-28: 80 ug via INTRAVENOUS

## 2011-10-28 MED ORDER — ATORVASTATIN CALCIUM 20 MG PO TABS
20.0000 mg | ORAL_TABLET | Freq: Every evening | ORAL | Status: DC
  Administered 2011-10-28 – 2011-10-30 (×3): 20 mg via ORAL
  Filled 2011-10-28 (×4): qty 1

## 2011-10-28 MED ORDER — EPHEDRINE SULFATE 50 MG/ML IJ SOLN
INTRAMUSCULAR | Status: DC | PRN
  Administered 2011-10-28: 5 mg via INTRAVENOUS

## 2011-10-28 MED ORDER — BUPIVACAINE IN DEXTROSE 0.75-8.25 % IT SOLN
INTRATHECAL | Status: DC | PRN
  Administered 2011-10-28: 2 mL via INTRATHECAL

## 2011-10-28 MED ORDER — BUPROPION HCL ER (XL) 300 MG PO TB24
300.0000 mg | ORAL_TABLET | Freq: Every evening | ORAL | Status: DC
  Administered 2011-10-28 – 2011-10-30 (×3): 300 mg via ORAL
  Filled 2011-10-28 (×4): qty 1

## 2011-10-28 MED ORDER — LORAZEPAM 0.5 MG PO TABS
0.5000 mg | ORAL_TABLET | Freq: Three times a day (TID) | ORAL | Status: DC | PRN
  Administered 2011-10-30: 0.5 mg via ORAL
  Filled 2011-10-28: qty 1

## 2011-10-28 MED ORDER — CEFAZOLIN SODIUM-DEXTROSE 2-3 GM-% IV SOLR
2.0000 g | INTRAVENOUS | Status: AC
  Administered 2011-10-28: 2 g via INTRAVENOUS

## 2011-10-28 MED ORDER — MENTHOL 3 MG MT LOZG
1.0000 | LOZENGE | OROMUCOSAL | Status: DC | PRN
  Filled 2011-10-28: qty 9

## 2011-10-28 MED ORDER — TRANEXAMIC ACID 100 MG/ML IV SOLN
15.0000 mg/kg | Freq: Once | INTRAVENOUS | Status: AC
  Administered 2011-10-28: 1297.5 mg via INTRAVENOUS
  Filled 2011-10-28: qty 12.98

## 2011-10-28 MED ORDER — HYDROMORPHONE HCL PF 1 MG/ML IJ SOLN
INTRAMUSCULAR | Status: AC
  Filled 2011-10-28: qty 1

## 2011-10-28 MED ORDER — DIPHENHYDRAMINE HCL 25 MG PO CAPS
25.0000 mg | ORAL_CAPSULE | Freq: Four times a day (QID) | ORAL | Status: DC | PRN
  Administered 2011-10-30: 25 mg via ORAL
  Filled 2011-10-28: qty 1

## 2011-10-28 MED ORDER — FERROUS SULFATE 325 (65 FE) MG PO TABS
325.0000 mg | ORAL_TABLET | Freq: Three times a day (TID) | ORAL | Status: DC
  Administered 2011-10-29 – 2011-10-31 (×8): 325 mg via ORAL
  Filled 2011-10-28 (×11): qty 1

## 2011-10-28 MED ORDER — PHENYLEPHRINE HCL 10 MG/ML IJ SOLN
10.0000 mg | INTRAVENOUS | Status: DC | PRN
  Administered 2011-10-28: 50 ug/min via INTRAVENOUS

## 2011-10-28 MED ORDER — SODIUM CHLORIDE 0.9 % IV SOLN
INTRAVENOUS | Status: AC
  Administered 2011-10-28 – 2011-10-29 (×2): via INTRAVENOUS
  Filled 2011-10-28 (×4): qty 1000

## 2011-10-28 MED ORDER — PROPOFOL 10 MG/ML IV BOLUS
INTRAVENOUS | Status: DC | PRN
  Administered 2011-10-28: 40 mg via INTRAVENOUS

## 2011-10-28 MED ORDER — HETASTARCH-ELECTROLYTES 6 % IV SOLN
INTRAVENOUS | Status: DC | PRN
  Administered 2011-10-28: 13:00:00 via INTRAVENOUS

## 2011-10-28 MED ORDER — FENTANYL CITRATE 0.05 MG/ML IJ SOLN
INTRAMUSCULAR | Status: DC | PRN
  Administered 2011-10-28: 25 ug via INTRAVENOUS
  Administered 2011-10-28 (×2): 50 ug via INTRAVENOUS
  Administered 2011-10-28: 25 ug via INTRAVENOUS
  Administered 2011-10-28: 100 ug via INTRAVENOUS

## 2011-10-28 MED ORDER — BUPIVACAINE-EPINEPHRINE PF 0.25-1:200000 % IJ SOLN
INTRAMUSCULAR | Status: DC | PRN
  Administered 2011-10-28: 60 mL

## 2011-10-28 MED ORDER — METHOCARBAMOL 100 MG/ML IJ SOLN
500.0000 mg | Freq: Four times a day (QID) | INTRAVENOUS | Status: DC | PRN
  Administered 2011-10-28 – 2011-10-29 (×2): 500 mg via INTRAVENOUS
  Filled 2011-10-28 (×2): qty 5

## 2011-10-28 MED ORDER — PROPOFOL 10 MG/ML IV BOLUS: INTRAVENOUS | Status: DC | PRN

## 2011-10-28 MED ORDER — POLYETHYLENE GLYCOL 3350 17 G PO PACK
17.0000 g | PACK | Freq: Two times a day (BID) | ORAL | Status: DC
  Administered 2011-10-28 – 2011-10-31 (×6): 17 g via ORAL

## 2011-10-28 MED ORDER — BUPIVACAINE-EPINEPHRINE PF 0.25-1:200000 % IJ SOLN
INTRAMUSCULAR | Status: AC
  Filled 2011-10-28: qty 60

## 2011-10-28 SURGICAL SUPPLY — 71 items
ADH SKN CLS APL DERMABOND .7 (GAUZE/BANDAGES/DRESSINGS) ×1
AUG TIB SZ2 15 REV STP WDG (Knees) ×2 IMPLANT
BAG SPEC THK2 15X12 ZIP CLS (MISCELLANEOUS) ×1
BAG ZIPLOCK 12X15 (MISCELLANEOUS) ×2 IMPLANT
BANDAGE ELASTIC 6 VELCRO ST LF (GAUZE/BANDAGES/DRESSINGS) ×2 IMPLANT
BANDAGE ESMARK 6X9 LF (GAUZE/BANDAGES/DRESSINGS) ×1 IMPLANT
BLADE SAW SGTL 13.0X1.19X90.0M (BLADE) ×2 IMPLANT
BLADE SAW SGTL 81X20 HD (BLADE) ×2 IMPLANT
BNDG CMPR 9X6 STRL LF SNTH (GAUZE/BANDAGES/DRESSINGS) ×1
BNDG ESMARK 6X9 LF (GAUZE/BANDAGES/DRESSINGS) ×2
BONE CEMENT GENTAMICIN (Cement) ×5 IMPLANT
BOWL SMART MIX CTS (DISPOSABLE) IMPLANT
CLOTH BEACON ORANGE TIMEOUT ST (SAFETY) ×2 IMPLANT
CUFF TOURN SGL QUICK 34 (TOURNIQUET CUFF) ×2
CUFF TRNQT CYL 34X4X40X1 (TOURNIQUET CUFF) ×1 IMPLANT
DERMABOND ADVANCED (GAUZE/BANDAGES/DRESSINGS) ×1
DERMABOND ADVANCED .7 DNX12 (GAUZE/BANDAGES/DRESSINGS) ×1 IMPLANT
DRAPE EXTREMITY T 121X128X90 (DRAPE) ×2 IMPLANT
DRAPE POUCH INSTRU U-SHP 10X18 (DRAPES) ×2 IMPLANT
DRAPE U-SHAPE 47X51 STRL (DRAPES) ×2 IMPLANT
DRSG ADAPTIC 3X8 NADH LF (GAUZE/BANDAGES/DRESSINGS) ×2 IMPLANT
DRSG AQUACEL AG ADV 3.5X10 (GAUZE/BANDAGES/DRESSINGS) ×2 IMPLANT
DRSG AQUACEL AG ADV 3.5X14 (GAUZE/BANDAGES/DRESSINGS) ×1 IMPLANT
DRSG PAD ABDOMINAL 8X10 ST (GAUZE/BANDAGES/DRESSINGS) ×2 IMPLANT
DRSG TEGADERM 4X4.75 (GAUZE/BANDAGES/DRESSINGS) ×2 IMPLANT
DURAPREP 26ML APPLICATOR (WOUND CARE) ×2 IMPLANT
ELECT REM PT RETURN 9FT ADLT (ELECTROSURGICAL) ×2
ELECTRODE REM PT RTRN 9FT ADLT (ELECTROSURGICAL) ×1 IMPLANT
EVACUATOR 1/8 PVC DRAIN (DRAIN) ×2 IMPLANT
FACESHIELD LNG OPTICON STERILE (SAFETY) ×10 IMPLANT
GAUZE SPONGE 2X2 8PLY STRL LF (GAUZE/BANDAGES/DRESSINGS) ×1 IMPLANT
GLOVE BIOGEL PI IND STRL 7.5 (GLOVE) ×1 IMPLANT
GLOVE BIOGEL PI IND STRL 8 (GLOVE) ×2 IMPLANT
GLOVE BIOGEL PI INDICATOR 7.5 (GLOVE) ×1
GLOVE BIOGEL PI INDICATOR 8 (GLOVE) ×2
GLOVE ORTHO TXT STRL SZ7.5 (GLOVE) ×4 IMPLANT
GLOVE SURG ORTHO 8.0 STRL STRW (GLOVE) ×2 IMPLANT
GOWN BRE IMP PREV XXLGXLNG (GOWN DISPOSABLE) ×4 IMPLANT
GOWN STRL NON-REIN LRG LVL3 (GOWN DISPOSABLE) ×2 IMPLANT
HANDPIECE INTERPULSE COAX TIP (DISPOSABLE) ×2
IMMOBILIZER KNEE 20 (SOFTGOODS)
IMMOBILIZER KNEE 20 THIGH 36 (SOFTGOODS) IMPLANT
KIT BASIN OR (CUSTOM PROCEDURE TRAY) ×2 IMPLANT
MANIFOLD NEPTUNE II (INSTRUMENTS) ×2 IMPLANT
NDL SAFETY ECLIPSE 18X1.5 (NEEDLE) ×1 IMPLANT
NEEDLE HYPO 18GX1.5 SHARP (NEEDLE) ×2
NS IRRIG 1000ML POUR BTL (IV SOLUTION) ×2 IMPLANT
PACK TOTAL JOINT (CUSTOM PROCEDURE TRAY) ×2 IMPLANT
PADDING CAST COTTON 6X4 STRL (CAST SUPPLIES) ×4 IMPLANT
PLATE ROT INSERT SZ 2.0 10MM (Plate) ×1 IMPLANT
POSITIONER SURGICAL ARM (MISCELLANEOUS) ×2 IMPLANT
SET HNDPC FAN SPRY TIP SCT (DISPOSABLE) ×1 IMPLANT
SET PAD KNEE POSITIONER (MISCELLANEOUS) ×2 IMPLANT
SPONGE GAUZE 2X2 STER 10/PKG (GAUZE/BANDAGES/DRESSINGS) ×1
SPONGE GAUZE 4X4 12PLY (GAUZE/BANDAGES/DRESSINGS) ×4 IMPLANT
SPONGE LAP 18X18 X RAY DECT (DISPOSABLE) ×2 IMPLANT
STAPLER VISISTAT 35W (STAPLE) IMPLANT
STEM UNIVERSAL REVISION 75X14 (Stem) ×1 IMPLANT
SUCTION FRAZIER 12FR DISP (SUCTIONS) ×2 IMPLANT
SUT VIC AB 1 CT1 36 (SUTURE) ×6 IMPLANT
SUT VIC AB 2-0 CT1 27 (SUTURE) ×6
SUT VIC AB 2-0 CT1 TAPERPNT 27 (SUTURE) ×3 IMPLANT
SYR 50ML LL SCALE MARK (SYRINGE) ×2 IMPLANT
TOWEL OR 17X26 10 PK STRL BLUE (TOWEL DISPOSABLE) ×4 IMPLANT
TOWER CARTRIDGE SMART MIX (DISPOSABLE) ×2 IMPLANT
TRAY FOLEY CATH 14FRSI W/METER (CATHETERS) ×2 IMPLANT
TRAY SLEEVE CEM ML (Knees) ×1 IMPLANT
TRAY TIB SZ 2 REVISION (Knees) ×1 IMPLANT
WATER STERILE IRR 1500ML POUR (IV SOLUTION) ×2 IMPLANT
WEDGE STEP SZ 2 15MM (Knees) ×2 IMPLANT
WRAP KNEE MAXI GEL POST OP (GAUZE/BANDAGES/DRESSINGS) ×2 IMPLANT

## 2011-10-28 NOTE — Anesthesia Preprocedure Evaluation (Addendum)
Anesthesia Evaluation  Patient identified by MRN, date of birth, ID band Patient awake    Reviewed: Allergy & Precautions, H&P , NPO status , Patient's Chart, lab work & pertinent test results  Airway Mallampati: II TM Distance: >3 FB Neck ROM: Full    Dental No notable dental hx.    Pulmonary neg pulmonary ROS,  breath sounds clear to auscultation  Pulmonary exam normal       Cardiovascular negative cardio ROS  Rhythm:Regular Rate:Normal     Neuro/Psych  Headaches, PSYCHIATRIC DISORDERS Anxiety Depression    GI/Hepatic Neg liver ROS, hiatal hernia,   Endo/Other  negative endocrine ROS  Renal/GU negative Renal ROS  negative genitourinary   Musculoskeletal negative musculoskeletal ROS (+)   Abdominal (+) + obese,   Peds negative pediatric ROS (+)  Hematology negative hematology ROS (+)   Anesthesia Other Findings   Reproductive/Obstetrics negative OB ROS                           Anesthesia Physical Anesthesia Plan  ASA: II  Anesthesia Plan: Spinal   Post-op Pain Management:    Induction: Intravenous  Airway Management Planned:   Additional Equipment:   Intra-op Plan:   Post-operative Plan: Extubation in OR  Informed Consent: I have reviewed the patients History and Physical, chart, labs and discussed the procedure including the risks, benefits and alternatives for the proposed anesthesia with the patient or authorized representative who has indicated his/her understanding and acceptance.   Dental advisory given  Plan Discussed with: CRNA  Anesthesia Plan Comments: (Discussed risks/benefits of spinal including headache, backache, failure, bleeding, infection, and nerve damage. Patient consents to spinal. Questions answered. Coagulation studies and platelet count acceptable. )       Anesthesia Quick Evaluation

## 2011-10-28 NOTE — Transfer of Care (Signed)
Immediate Anesthesia Transfer of Care Note  Patient: Melissa Leach  Procedure(s) Performed: Procedure(s) (LRB): TOTAL KNEE REVISION (Right)  Patient Location: PACU  Anesthesia Type: Regional and Spinal  Level of Consciousness: awake, alert  and patient cooperative  Airway & Oxygen Therapy: Patient Spontanous Breathing and Patient connected to face mask oxygen  Post-op Assessment: Report given to PACU RN and Post -op Vital signs reviewed and stable  Post vital signs: Reviewed and stable  Complications: No apparent anesthesia complications

## 2011-10-28 NOTE — Anesthesia Postprocedure Evaluation (Signed)
  Anesthesia Post-op Note  Patient: Melissa Leach  Procedure(s) Performed: Procedure(s) (LRB): TOTAL KNEE REVISION (Right)  Patient Location: PACU  Anesthesia Type: Spinal  Level of Consciousness: awake and alert   Airway and Oxygen Therapy: Patient Spontanous Breathing  Post-op Pain: mild  Post-op Assessment: Post-op Vital signs reviewed, Patient's Cardiovascular Status Stable, Respiratory Function Stable, Patent Airway and No signs of Nausea or vomiting  Post-op Vital Signs: stable  Complications: No apparent anesthesia complications

## 2011-10-28 NOTE — Interval H&P Note (Signed)
History and Physical Interval Note:  10/28/2011 11:39 AM  Melissa Leach  has presented today for surgery, with the diagnosis of Failed Right Total Knee Arthroplasty  The various methods of treatment have been discussed with the patient and family. After consideration of risks, benefits and other options for treatment, the patient has consented to  Procedure(s) (LRB): TOTAL KNEE REVISION (Right) as a surgical intervention .  The patient's history has been reviewed, patient examined, no change in status, stable for surgery.  I have reviewed the patient's chart and labs.  Questions were answered to the patient's satisfaction.     [name]

## 2011-10-28 NOTE — Brief Op Note (Signed)
10/28/2011  [facility]  MRN: [id number] CSN: [id number]  3:12 PM  PATIENT:  Melissa Leach  61 y.o. female  PRE-OPERATIVE DIAGNOSIS:  Failed Right Total Knee Arthroplasty, aseptic loosening of tibial tray  POST-OPERATIVE DIAGNOSIS:  Failed Right Total Knee Arthroplasty, aseptic loosening of tibial tray  PROCEDURE:  Procedure(s) (LRB): TOTAL KNEE REVISION (Right)  Depuy components  SURGEON:  Surgeon(s) and Role:    * [name], MD - Primary  PHYSICIAN ASSISTANT: Lanney Gins, PA-C  ANESTHESIA:   spinal  EBL:  Total I/O In: 2500 [I.V.:2000; IV Piggyback:500] Out: 900 [Urine:850; Blood:50]  BLOOD ADMINISTERED:none  DRAINS: (1 medium) Hemovact drain(s) in the right knee with  Suction Open   LOCAL MEDICATIONS USED:  MARCAINE plus Toradol  SPECIMEN:  No Specimen  DISPOSITION OF SPECIMEN:  N/A  COUNTS:  YES  TOURNIQUET:   Total Tourniquet Time Documented: Thigh (Right) - 90 minutes  DICTATION: .Other Dictation: Dictation Number [id number]  PLAN OF CARE: Admit to inpatient   PATIENT DISPOSITION:  PACU - hemodynamically stable.   Delay start of Pharmacological VTE agent (>24hrs) due to surgical blood loss or risk of bleeding: no

## 2011-10-28 NOTE — Plan of Care (Signed)
Problem: Consults Goal: Diagnosis- Total Joint Replacement Primary Total Knee     

## 2011-10-28 NOTE — Anesthesia Procedure Notes (Signed)
Spinal  Patient location during procedure: OR Staffing Anesthesiologist: Azell Der Performed by: anesthesiologist  Preanesthetic Checklist Completed: patient identified, site marked, surgical consent, pre-op evaluation, timeout performed, IV checked, risks and benefits discussed and monitors and equipment checked Spinal Block Patient position: sitting Prep: Betadine Patient monitoring: heart rate, continuous pulse ox and blood pressure Location: L3-4 Injection technique: single-shot Needle Needle type: Sprotte  Needle gauge: 24 G Needle length: 9 cm Additional Notes Expiration date of kit checked and confirmed. Patient tolerated procedure well, without complications. No paresthesia. No heme in CSF.

## 2011-10-29 LAB — CBC
HCT: 32 % — ABNORMAL LOW (ref 36.0–46.0)
MCH: 30.2 pg (ref 26.0–34.0)
MCV: 92 fL (ref 78.0–100.0)
Platelets: 243 10*3/uL (ref 150–400)
RDW: 14.3 % (ref 11.5–15.5)
WBC: 11 10*3/uL — ABNORMAL HIGH (ref 4.0–10.5)

## 2011-10-29 LAB — BASIC METABOLIC PANEL
BUN: 13 mg/dL (ref 6–23)
Calcium: 8.9 mg/dL (ref 8.4–10.5)
Chloride: 110 mEq/L (ref 96–112)
Creatinine, Ser: 0.73 mg/dL (ref 0.50–1.10)
GFR calc Af Amer: >90 mL/min (ref >90)

## 2011-10-29 MED ORDER — OXYCODONE HCL 5 MG PO TABS
5.0000 mg | ORAL_TABLET | ORAL | Status: DC | PRN
  Administered 2011-10-29: 5 mg via ORAL
  Administered 2011-10-29: 10 mg via ORAL
  Administered 2011-10-29 – 2011-10-31 (×12): 15 mg via ORAL
  Filled 2011-10-29 (×3): qty 3
  Filled 2011-10-29: qty 1
  Filled 2011-10-29 (×7): qty 3
  Filled 2011-10-29: qty 2
  Filled 2011-10-29 (×2): qty 3

## 2011-10-29 NOTE — Op Note (Signed)
Melissa Leach, BONIFAS                ACCOUNT NO.:  [id number]  MEDICAL RECORD NO.:  [id number]  LOCATION:  1608                         FACILITY:  [facility]  PHYSICIAN:  Madlyn Frankel. Charlann Boxer, M.D.  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  PROCEDURE:  Revision right total knee replacement utilizing DePuy components with a size 2 MBT revision tray with a 29-mm cemented sleeve, 15 mm medial and lateral augments, and a 14 x 75 mm Press-Fit stem, and a 10-mm posterior stabilized insert.  SURGEON:  Madlyn Frankel. Charlann Boxer, M.D.  ASSISTANT:  Lanney Gins, PA-C  Note, Mr. Melissa Leach was present for the entirety of the case, utilized from preoperative position, perioperative management of the operative extremity, protection of vital soft tissues, general facilitation of the case, and primary wound closure.  ANESTHESIA:  Spinal.  SPECIMENS:  None taken as the patient had preoperative normal workup with no concern for infection and clear synovial fluid.  DRAINS:  One medium Hemovac placed into the right knee.  TOURNIQUET TIME:  90 minutes at 250 mmHg.  COMPLICATIONS:  None.  INDICATION FOR PROCEDURE:  Melissa Leach is a very pleasant 61 year old female known to me for management of multiple orthopedic issues.  Most recently, she has undergone a left total hip replacement that she has done well with.  She has reported progressively worsening right leg pain.  Workup was negative for any radiculopathy.  Bone scan was positive for loose tibial tray.  She had a history of revision right total knee in the past.  After reviewing Melissa Leach's the current situation and options, she wished to proceed with revision  surgery due to the increasing pain and inability to get around.  We discussed differential persistent radicular pain versus pain from her hip, which is noted to be arthritic as well.  After reviewing risks of infection, DVT, component failure, need for future revision surgery, she was ready to proceed.  Consent was obtained for benefit of pain relief.  PROCEDURE IN DETAIL:  The patient was brought to the operative theater. Once adequate anesthesia, preoperative antibiotics, Ancef administered, she was positioned supine.  Right thigh tourniquet placed.  Right lower extremity was then prepped and draped in sterile fashion.  The right foot was placed in DeMayo leg holder.  Time-out was performed identifying the patient, planned procedure, extremity.  At this point, I excised her old scar.  Soft tissue dissection was carried down to the extensor mechanism.  The median arthrotomy was made through the old incision removing the old Ethibond sutures.  Following exposure of proximal and medial peel, and synovectomy, I was able to remove the old polyethylene insert.  I was then able to sublux the tibia carefully anteriorly.  I did use a smooth pin in the proximal tibia to prevent any avulsion or excessive peeling at the tibial tubercle.  At this point, assessing the femoral component, felt that  it was stable consistent with a preoperative workup.  I then used an ACL saw and undermined the cement interface with the tibia subluxated anteriorly.  I was able to remove the tibial stem and tray without much difficulty.  At this point probably 30 minutes of the case was spent trying to remove the remaining cement on the proximal tibia down.  Once I had all the cement removed again re-configured the proximal tibia.  Initially I used a size 2 tibial tray to fit this cut surface.  We drilled for the proximal femur, then broached for a 29 mm broach.  Thought at first I would use a 5 mm medial and  lateral augments due to the fact that we had a large polyethylene insert, and ended up at the insert was probably 17.5 mm thick.  Once I hand reamed the tibia and did a trial reduction with a 5 mm medial and lateral augments, we went all the way up to 17.5 poly and for that reason I removed the trial components and added 15 mm medial and lateral augments to restore joint height.  When we did this, we had size 2 tibial MBT revision tray, a cemented 75 mm long stem and a 29 mm cemented sleeve, the knee then was stable from extension and flexion upon the medial lateral collateral ligaments in extension to flexion.  The patella area was debrided of surrounding synovial fluid.  She was noted to have pre thin patella on examination and had inset patella done, I did not choose to remove this at this point.  At this point, all trial components removed.  The final components were opened including the size 2 MBT revision tray, a 17 x 14 mm Press-Fit stem, 15 mm medial and lateral augments, and a 29 cemented sleeve. These were all configured and confirmed on the back table.  Once this were made, the cement was mixed.  The final component was then cemented in the proximal tibia, and pressed it distally with a 14 mm stem.  The knee was brought to extension with a 10 mm insert and allowed the cement to cure.  Once the cement had fully cured and we confirmed there was no remaining cement throughout the knee, we irrigated the knee and placed the final 10 mm posterior stabilized insert.  The knee was re- irrigated with pulse lavage normal saline solution.  Tourniquet was let down after 90 minutes.  No significant hemostasis required.  The knee extensor mechanism was then reapproximated with the use of combination of #1 Vicryl.  Then 0 V-Loc sutures over this Hemovac drain.  The remaining wound was closed with 2-0 Vicryl and a running 4-0 Monocryl. The patient's old incision was utilized for the entire  extent.  I did excise this incision and closed with 4-0 Monocryl to better reapproximate this.  The knee was then cleaned, dried, and dressed sterilely.  Drain site dressed separately.  The main incision dressed with Dermabond and Aquacel dressing.  She was then brought to the recovery room in stable condition tolerating the procedure well.     Madlyn Frankel Charlann Boxer, M.D.     MDO/MEDQ  D:  10/28/2011  T:  10/29/2011  Job:  [id number]

## 2011-10-29 NOTE — Progress Notes (Signed)
  Subjective: 1 Day Post-Op Procedure(s) (LRB): TOTAL KNEE REVISION (Right)   Patient reports pain as moderate, pain not well controlled full. No events throughout the night.   Objective:   VITALS:   Filed Vitals:   10/29/11 0800  BP: 95/64  Pulse: 95  Temp: 97.8 F (36.6 C)   Resp: 16    Neurovascular intact Dorsiflexion/Plantar flexion intact Incision: dressing C/D/I No cellulitis present Compartment soft  LABS  Basename 10/29/11 0422  HGB 10.5*  HCT 32.0*  WBC 11.0*  PLT 243     Basename 10/29/11 0422  NA 140  K 3.8  BUN 13  CREATININE 0.73  GLUCOSE 135*     Assessment/Plan: 1 Day Post-Op Procedure(s) (LRB): TOTAL KNEE REVISION (Right)   HV drain d/c'ed Foley cath d/c'ed Advance diet Up with therapy D/C IV fluids Discharge home with home health tomorrow if continues to do well.   Anastasio Auerbach Shatia Sindoni   PAC  10/29/2011, 8:37 AM

## 2011-10-29 NOTE — Progress Notes (Signed)
Physical Therapy Treatment Patient Details Name: Melissa Leach MRN: [id number] DOB: Feb 02, 1951 Today's Date: 10/29/2011 Time: 1478-2956 PT Time Calculation (min): 25 min  PT Assessment / Plan / Recommendation Comments on Treatment Session       Follow Up Recommendations  Home health PT    Barriers to Discharge        Equipment Recommendations  None recommended by PT    Recommendations for Other Services    Frequency 7X/week   Plan Discharge plan remains appropriate    Precautions / Restrictions Precautions Precautions: Knee Required Braces or Orthoses: Knee Immobilizer - Right Restrictions Weight Bearing Restrictions: No RLE Weight Bearing: Weight bearing as tolerated   Pertinent Vitals/Pain 3-4/10; premedicated.    Mobility  Bed Mobility Bed Mobility: Sit to Supine Sit to Supine: 4: Min assist Details for Bed Mobility Assistance: min cues for sequence and use of L LE to self assist Transfers Transfers: Sit to Stand;Stand to Sit Sit to Stand: 4: Min assist Stand to Sit: 4: Min assist Details for Transfer Assistance: cues for LE management and use of UEs to self assist Ambulation/Gait Ambulation/Gait Assistance: 4: Min assist Ambulation Distance (Feet): 160 Feet (and 25') Assistive device: Rolling walker Ambulation/Gait Assistance Details: cues for sequence, stride length and position from RW Gait Pattern: Step-to pattern;Decreased stance time - right    Exercises     PT Diagnosis:    PT Problem List:   PT Treatment Interventions:     PT Goals Acute Rehab PT Goals PT Goal Formulation: With patient Time For Goal Achievement: 11/05/11 Potential to Achieve Goals: Good Pt will go Supine/Side to Sit: with supervision PT Goal: Supine/Side to Sit - Progress: Goal set today Pt will go Sit to Supine/Side: with supervision PT Goal: Sit to Supine/Side - Progress: Goal set today Pt will go Sit to Stand: with supervision PT Goal: Sit to Stand - Progress:  Progressing toward goal Pt will go Stand to Sit: with supervision PT Goal: Stand to Sit - Progress: Progressing toward goal Pt will Ambulate: >150 feet;with supervision;with rolling walker PT Goal: Ambulate - Progress: Progressing toward goal Pt will Go Up / Down Stairs: 1-2 stairs;with min assist;with least restrictive assistive device PT Goal: Up/Down Stairs - Progress: Goal set today  Visit Information  Last PT Received On: 10/29/11 Assistance Needed: +1    Subjective Data  Subjective: I had some pain this morning - I think it was trying to bend it Patient Stated Goal: Resume previous lifestyle   Cognition  Overall Cognitive Status: Appears within functional limits for tasks assessed/performed Arousal/Alertness: Awake/alert Orientation Level: Appears intact for tasks assessed Behavior During Session: [facility] for tasks performed    Balance     End of Session PT - End of Session Equipment Utilized During Treatment: Right knee immobilizer Activity Tolerance: Patient tolerated treatment well Patient left: with call bell/phone within reach;in bed Nurse Communication: Mobility status   GP     Eddi Hymes 10/29/2011, 2:58 PM

## 2011-10-29 NOTE — Progress Notes (Signed)
OT Note:  Pt screened for OT.  She has had several orthopedic surgeries and has no OT needs at this time.  [place], [place] [phone] 10/29/2011

## 2011-10-29 NOTE — Evaluation (Signed)
Physical Therapy Evaluation Patient Details Name: Melissa Leach MRN: [id number] DOB: [birth year] Today's Date: 10/29/2011 Time: 0900-0940 PT Time Calculation (min): 40 min  PT Assessment / Plan / Recommendation Clinical Impression  Pt wtih R TKR presents with decreased R LE strength/ROM and limitations in functional mobiity    PT Assessment  Patient needs continued PT services    Follow Up Recommendations  Home health PT    Barriers to Discharge        Equipment Recommendations  None recommended by PT    Recommendations for Other Services     Frequency 7X/week    Precautions / Restrictions Precautions Precautions: Knee Required Braces or Orthoses: Knee Immobilizer - Right Restrictions Weight Bearing Restrictions: No RLE Weight Bearing: Weight bearing as tolerated   Pertinent Vitals/Pain 3/10 ; pt premedicated, cold packs provided      Mobility  Bed Mobility Bed Mobility: Supine to Sit Supine to Sit: 4: Min assist Details for Bed Mobility Assistance: min cues for sequence and use of L LE to self assist Transfers Transfers: Sit to Stand;Stand to Sit Sit to Stand: 4: Min assist Stand to Sit: 4: Min assist Details for Transfer Assistance: cues for LE management and use of UEs to self assist Ambulation/Gait Ambulation/Gait Assistance: 4: Min assist Ambulation Distance (Feet): 100 Feet (and 25) Assistive device: Rolling walker Ambulation/Gait Assistance Details: cues for sequence, posture and position from RW Gait Pattern: Step-to pattern;Decreased stance time - right    Exercises Total Joint Exercises Ankle Circles/Pumps: AROM;10 reps;Both;Supine Quad Sets: AROM;10 reps;Both;Supine Heel Slides: AAROM;10 reps;Supine;Right Straight Leg Raises: AAROM;Right;10 reps;Supine   PT Diagnosis: Difficulty walking  PT Problem List: Decreased strength;Decreased range of motion;Decreased activity tolerance;Decreased mobility;Decreased knowledge of use of DME;Pain PT  Treatment Interventions: DME instruction;Gait training;Stair training;Functional mobility training;Therapeutic activities;Therapeutic exercise;Patient/family education   PT Goals Acute Rehab PT Goals PT Goal Formulation: With patient Time For Goal Achievement: 11/05/11 Potential to Achieve Goals: Good Pt will go Supine/Side to Sit: with supervision PT Goal: Supine/Side to Sit - Progress: Goal set today Pt will go Sit to Supine/Side: with supervision PT Goal: Sit to Supine/Side - Progress: Goal set today Pt will go Sit to Stand: with supervision PT Goal: Sit to Stand - Progress: Goal set today Pt will go Stand to Sit: with supervision PT Goal: Stand to Sit - Progress: Goal set today Pt will Ambulate: >150 feet;with supervision;with rolling walker PT Goal: Ambulate - Progress: Goal set today Pt will Go Up / Down Stairs: 1-2 stairs;with min assist;with least restrictive assistive device PT Goal: Up/Down Stairs - Progress: Goal set today  Visit Information  Last PT Received On: 10/29/11 Assistance Needed: +1    Subjective Data  Subjective: They didn't have to redo the part in the femur Patient Stated Goal: Resume previous lifestyle   Prior Functioning  Home Living Lives With: Spouse Available Help at Discharge: Family Type of Home: House Home Access: Stairs to enter Secretary/administrator of Steps: 2 Entrance Stairs-Rails: None Home Layout: One level Home Adaptive Equipment: Walker - rolling Prior Function Level of Independence: Independent Able to Take Stairs?: Yes Driving: Yes Communication Communication: No difficulties    Cognition  Overall Cognitive Status: Appears within functional limits for tasks assessed/performed Arousal/Alertness: Awake/alert Orientation Level: Appears intact for tasks assessed Behavior During Session: [facility] for tasks performed    Extremity/Trunk Assessment Right Upper Extremity Assessment RUE ROM/Strength/Tone: [facility] for tasks assessed Left  Upper Extremity Assessment LUE ROM/Strength/Tone: [facility] for tasks assessed Right Lower Extremity Assessment  RLE ROM/Strength/Tone: Deficits RLE ROM/Strength/Tone Deficits: 2/5 quads, with AAROM at knee -10 - 50 Left Lower Extremity Assessment LLE ROM/Strength/Tone: WFL for tasks assessed   Balance    End of Session PT - End of Session Equipment Utilized During Treatment: Right knee immobilizer Activity Tolerance: Patient tolerated treatment well Patient left: in chair;with call bell/phone within reach Nurse Communication: Mobility status  GP     Zain Lankford 10/29/2011, 12:24 PM

## 2011-10-30 ENCOUNTER — Encounter (HOSPITAL_COMMUNITY): Payer: Self-pay | Admitting: Orthopedic Surgery

## 2011-10-30 LAB — BASIC METABOLIC PANEL
BUN: 17 mg/dL (ref 6–23)
Chloride: 110 mEq/L (ref 96–112)
Glucose, Bld: 133 mg/dL — ABNORMAL HIGH (ref 70–99)
Potassium: 4.2 mEq/L (ref 3.5–5.1)
Sodium: 140 mEq/L (ref 135–145)

## 2011-10-30 LAB — CBC
HCT: 28.2 % — ABNORMAL LOW (ref 36.0–46.0)
Hemoglobin: 9.3 g/dL — ABNORMAL LOW (ref 12.0–15.0)
RBC: 3.06 MIL/uL — ABNORMAL LOW (ref 3.87–5.11)
WBC: 10.6 10*3/uL — ABNORMAL HIGH (ref 4.0–10.5)

## 2011-10-30 NOTE — Care Management Note (Unsigned)
    Page 1 of 2   10/30/2011     6:26:34 PM   CARE MANAGEMENT NOTE 10/30/2011  Patient:  Melissa Leach, Melissa Leach   Account Number:  [id number]  Date Initiated:  10/30/2011  Documentation initiated by:  Colleen Can  Subjective/Objective Assessment:   dx failed rt total knee arthroplasty; revision of rt total knee     Action/Plan:   CM spoke with patient and spouse. Plans are for patient to return to her home in [place] where spouse will be caregiver. She already has RW, commode chair. Wants Genevieve Norlander for [facility] services   Anticipated DC Date:  10/31/2011   Anticipated DC Plan:  HOME W HOME HEALTH SERVICES  In-house referral  NA      DC Planning Services  CM consult      [facility] Choice  HOME HEALTH   Choice offered to / List presented to:  C-1 Patient   DME arranged  NA      DME agency  NA     HH arranged  HH-2 PT      [facility] agency  [facility]   Status of service:  Completed, signed off Medicare Important Message given?   (If response is "NO", the following Medicare IM given date fields will be blank) Date Medicare IM given:   Date Additional Medicare IM given:    Discharge Disposition:    Per UR Regulation:  Reviewed for med. necessity/level of care/duration of stay  If discussed at Long Length of Stay Meetings, dates discussed:    Comments:

## 2011-10-30 NOTE — Progress Notes (Signed)
Physical Therapy Treatment Patient Details Name: Melissa Leach MRN: [id number] DOB: 04/20/1950 Today's Date: 10/30/2011 Time: 1440-1500 PT Time Calculation (min): 20 min  PT Assessment / Plan / Recommendation Comments on Treatment Session  Pt c/o fatigue this afternoon.  Plans are to D/C to home tomorrow vs today.    Follow Up Recommendations  Home health PT    Barriers to Discharge        Equipment Recommendations  None recommended by PT    Recommendations for Other Services    Frequency 7X/week   Plan Discharge plan remains appropriate    Precautions / Restrictions Precautions Precautions: Knee Precaution Comments: Pt instructed on KI use for amb and when to D/C after 10 active SLR Required Braces or Orthoses: Knee Immobilizer - Right Restrictions Weight Bearing Restrictions: No RLE Weight Bearing: Weight bearing as tolerated    Pertinent Vitals/Pain C/o fatigue 5/10 R knee pain ICE applied    Mobility  Bed Mobility Bed Mobility: Sit to Supine Supine to Sit: 4: Min guard Sit to Supine: 4: Min guard Details for Bed Mobility Assistance: MinGuard assist to support R LE up in bed  Transfers Transfers: Sit to Stand;Stand to Sit Sit to Stand: 4: Min guard;From chair/3-in-1 Stand to Sit: 4: Min guard;To bed Details for Transfer Assistance: one VC to extend R LE prior to sit and reach back  Ambulation/Gait Ambulation/Gait Assistance: 4: Min guard Ambulation Distance (Feet): 125 Feet Assistive device: Rolling walker Ambulation/Gait Assistance Details: Pt c/o fatigue and requested to go back to bed Gait Pattern: Step-to pattern;Decreased stance time - right Gait velocity: decreased         PT Goals                              Progressing     Visit Information  Last PT Received On: 10/30/11 Assistance Needed: +1                   End of Session PT - End of Session Equipment Utilized During Treatment: Gait belt;Right knee immobilizer Activity  Tolerance: Patient limited by fatigue;Patient limited by pain Patient left: in bed;with call bell/phone within reach;Other (comment) (ICE to R knee)   Felecia Shelling  PTA [facility]  Acute  Rehab Pager     [phone]

## 2011-10-30 NOTE — Progress Notes (Signed)
Physical Therapy Treatment Patient Details Name: Melissa Leach MRN: [id number] DOB: 23-Jan-1951 Today's Date: 10/30/2011 Time: 4098-1191 PT Time Calculation (min): 30 min  PT Assessment / Plan / Recommendation Comments on Treatment Session  Pt states she is having difficulty controlling her pain and worries about poss D/C today.    Follow Up Recommendations  Home health PT    Barriers to Discharge        Equipment Recommendations  None recommended by PT    Recommendations for Other Services    Frequency 7X/week   Plan Discharge plan remains appropriate    Precautions / Restrictions Precautions Precautions: Knee Precaution Comments: Pt instructed on KI use for amb and when to D/C after 10 active SLR Required Braces or Orthoses: Knee Immobilizer - Right Restrictions Weight Bearing Restrictions: No RLE Weight Bearing: Weight bearing as tolerated    Pertinent Vitals/Pain C/o 5/10 with Amb ICE applied    Mobility  Bed Mobility Bed Mobility: Supine to Sit Supine to Sit: 4: Min guard Details for Bed Mobility Assistance: minguard assist to support R LE off bed and increased time  Transfers Transfers: Sit to Stand;Stand to Sit Sit to Stand: 4: Min guard;From bed Stand to Sit: 4: Min guard;To chair/3-in-1 Details for Transfer Assistance: 25% VC's on proper R LE advancement prior to sit and hand placement to control decend  Ambulation/Gait Ambulation/Gait Assistance: 4: Min guard Ambulation Distance (Feet): 85 Feet Assistive device: Rolling walker Ambulation/Gait Assistance Details: decreased amb distance 2nd increased c/o pain.  Pt also c/o R hip pain (groin) since surgery. Gait Pattern: Step-to pattern;Decreased stance time - right Gait velocity: decreased         PT Goals                            progressing   Visit Information  Last PT Received On: 10/30/11 Assistance Needed: +1                   End of Session PT - End of Session Equipment Utilized  During Treatment: Gait belt;Right knee immobilizer Activity Tolerance: Patient tolerated treatment well Patient left: in chair;with call bell/phone within reach;with family/visitor present   Felecia Shelling  PTA [facility]  Acute  Rehab Pager     [phone]

## 2011-10-30 NOTE — Progress Notes (Signed)
Physical Therapy Treatment Patient Details Name: Hanifah Royse MRN: [id number] DOB: January 11, 1951 Today's Date: 10/30/2011 Time: 4098-1191 PT Time Calculation (min): 25 min  PT Assessment / Plan / Recommendation Comments on Treatment Session  Pt states she has a rough night with poor sleep.  Also states, she is having difficulty controlling her pain level.    Follow Up Recommendations  Home health PT    Barriers to Discharge        Equipment Recommendations  None recommended by PT    Recommendations for Other Services    Frequency 7X/week   Plan Discharge plan remains appropriate    Precautions / Restrictions Precautions Precautions: Knee Precaution Comments: Pt instructed on KI use for amb and when to D/C after 10 active SLR Required Braces or Orthoses: Knee Immobilizer - Right Restrictions Weight Bearing Restrictions: No RLE Weight Bearing: Weight bearing as tolerated    Pertinent Vitals/Pain C/o 7/10 pain with TE's    Mobility    Tx session focused on TE's   Exercises Total Joint Exercises Ankle Circles/Pumps: AROM;Both;10 reps;Supine Quad Sets: AROM;Both;10 reps;Supine Gluteal Sets: AROM;Both;10 reps;Supine Towel Squeeze: AROM;Both;10 reps;Supine Short Arc Quad: AROM;Right;10 reps;Supine Heel Slides: AAROM;Right;10 reps;Supine Hip ABduction/ADduction: AAROM;Right;10 reps;Supine Straight Leg Raises: AAROM;Right;10 reps;Supine      Visit Information  Last PT Received On: 10/30/11 Assistance Needed: +1                   End of Session PT - End of Session Activity Tolerance: Patient tolerated treatment well Patient left: in bed;with call bell/phone within reach;Other (comment) (ICE to R knee)  Felecia Shelling  PTA [facility]  Acute  Rehab Pager     [phone]

## 2011-10-30 NOTE — Progress Notes (Signed)
  Subjective: 2 Days Post-Op Procedure(s) (LRB): TOTAL KNEE REVISION (Right)   Patient reports pain as moderate, she feels pain is not well controlled though it is better. Would like to see if the pain gets better throughout today without adding more pain medicines.  Objective:   VITALS:   Filed Vitals:   10/30/11 0623  BP: 100/73  Pulse: 82  Temp: 97.8 F (36.6 C)  Resp: 16    Neurovascular intact Dorsiflexion/Plantar flexion intact Incision: dressing C/D/I No cellulitis present Compartment soft  LABS  Basename 10/30/11 0406 10/29/11 0422  HGB 9.3* 10.5*  HCT 28.2* 32.0*  WBC 10.6* 11.0*  PLT 236 243     Basename 10/30/11 0406 10/29/11 0422  NA 140 140  K 4.2 3.8  BUN 17 13  CREATININE 0.74 0.73  GLUCOSE 133* 135*     Assessment/Plan: 2 Days Post-Op Procedure(s) (LRB): TOTAL KNEE REVISION (Right)   Up with therapy Plan for discharge tomorrow to home if pain is better controlled.   Anastasio Auerbach Avalon Coppinger   PAC  10/30/2011, 11:04 AM

## 2011-10-31 MED ORDER — OXYCODONE HCL 5 MG PO TABS: 5.0000 mg | ORAL_TABLET | ORAL | Status: AC | PRN

## 2011-10-31 MED ORDER — FERROUS SULFATE 325 (65 FE) MG PO TABS: 325.0000 mg | ORAL_TABLET | Freq: Three times a day (TID) | ORAL | Status: DC

## 2011-10-31 MED ORDER — ASPIRIN EC 325 MG PO TBEC: 325.0000 mg | DELAYED_RELEASE_TABLET | Freq: Two times a day (BID) | ORAL | Status: AC

## 2011-10-31 MED ORDER — DSS 100 MG PO CAPS: 100.0000 mg | ORAL_CAPSULE | Freq: Two times a day (BID) | ORAL | Status: AC

## 2011-10-31 MED ORDER — POLYETHYLENE GLYCOL 3350 17 G PO PACK: 17.0000 g | PACK | Freq: Two times a day (BID) | ORAL | Status: AC

## 2011-10-31 MED ORDER — METHOCARBAMOL 500 MG PO TABS: 500.0000 mg | ORAL_TABLET | Freq: Four times a day (QID) | ORAL | Status: AC | PRN

## 2011-10-31 MED ORDER — DIPHENHYDRAMINE HCL 25 MG PO CAPS: 25.0000 mg | ORAL_CAPSULE | Freq: Four times a day (QID) | ORAL | Status: DC | PRN

## 2011-10-31 NOTE — Progress Notes (Signed)
Physical Therapy Treatment Patient Details Name: Melissa Leach MRN: [id number] DOB: 28-Jun-1950 Today's Date: 10/31/2011 Time: 1100-1135 PT Time Calculation (min): 35 min  PT Assessment / Plan / Recommendation Comments on Treatment Session  Pt plans to D/C to home today.  Practiced steps and educated on HEP.    Follow Up Recommendations  Home health PT    Barriers to Discharge        Equipment Recommendations  None recommended by PT    Recommendations for Other Services    Frequency 7X/week   Plan Discharge plan remains appropriate    Precautions / Restrictions Precautions Precautions: Knee Precaution Comments: Pt instructed on KI use for amb and when to D/C after 10 active SLR  Required Braces or Orthoses: Knee Immobilizer - Right Knee Immobilizer - Right: Discontinue once straight leg raise with < 10 degree lag Restrictions Weight Bearing Restrictions: No RLE Weight Bearing: Weight bearing as tolerated    Pertinent Vitals/Pain C/o 3/10 ICE applied    Mobility  Bed Mobility Bed Mobility: Not assessed Details for Bed Mobility Assistance: Pt OOB in recliner  Transfers Transfers: Sit to Stand;Stand to Sit Sit to Stand: 5: Supervision;From chair/3-in-1 Stand to Sit: 5: Supervision Details for Transfer Assistance: increased time  Ambulation/Gait Ambulation/Gait Assistance: 5: Supervision Assistive device: Rolling walker Ambulation/Gait Assistance Details: one VC to increase posture and proper walker to self distance Gait Pattern: Step-to pattern;Decreased stance time - right Gait velocity: decreased  Stairs: Yes Stairs Assistance: 4: Min assist Stairs Assistance Details (indicate cue type and reason): 25% VC's on proper sequencing and safety Stair Management Technique: No rails;Backwards;With walker Number of Stairs: 2      PT Goals                                progressing    Visit Information  Last PT Received On: 10/31/11 Assistance Needed: +1                  End of Session PT - End of Session Equipment Utilized During Treatment: Gait belt;Right knee immobilizer Activity Tolerance: Patient tolerated treatment well Patient left: in chair;with call bell/phone within reach (ICE to R knee)   Felecia Shelling  PTA [facility]  Acute  Rehab Pager     [phone]

## 2011-10-31 NOTE — Discharge Summary (Signed)
Physician Discharge Summary  Patient ID: Melissa Leach MRN: [id number] DOB/AGE: 60-Feb-1952 60 y.o.  Admit date: 10/28/2011 Discharge date:  10/31/2011  Procedures:  Procedure(s) (LRB): TOTAL KNEE REVISION (Right)  Attending Physician:  Dr. Durene Romans   Admission Diagnoses:  Right knee pain, S/P right TKA  Discharge Diagnoses:  Principal Problem:  *S/P right knee revision Gastritis   H/O hiatal hernia   Headache - MIGRAINES   Arthritis   Anxiety   Depression   Complication of anesthesia    HPI: Pt is a 61 y.o. female complaining of right knee pain for years. She had her original total knee arthroplasty in 2001. Do to some pain and after an examination it was determined that the femoral component had loosened and she had a revision of the femoral component in 2002 at [facility]. Pain had continually increased since the beginning. Once she recovered from other surgeries including a left total hip arthroplasty, her pain persisted. Studies reveal possible loosening of the tibial component of the right knee. Various options are discussed with the patient. Risks, benefits and expectations were discussed with the patient. Patient understand the risks, benefits and expectations and wishes to proceed with surgery.    PCP: Emeterio [name], MD   Discharged Condition: good  Hospital Course:  Patient underwent the above stated procedure on 10/28/2011. Patient tolerated the procedure well and brought to the recovery room in good condition and subsequently to the floor.  POD #1 BP: 95/64 ; Pulse: 95 ; Temp: 97.8 F (36.6 C) ; Resp: 16  Pt's foley was removed, as well as the hemovac drain removed. IV was changed to a saline lock. Patient reports pain as moderate, pain not well controlled full. No events throughout the night. Neurovascular intact, dorsiflexion/plantar flexion intact, incision: dressing C/D/I, no cellulitis present and compartment soft.   LABS  Basename  10/29/11 0422  HGB   10.5  HCT  32.0   POD #2  BP: 100/73 ; Pulse: 82 ; Temp: 97.8 F (36.6 C) ; Resp: 16  Patient reports pain as moderate, she feels pain is not well controlled though it is better. Would like to see if the pain gets better throughout today without adding more pain medicines. Neurovascular intact, dorsiflexion/plantar flexion intact, incision: dressing C/D/I, no cellulitis present and compartment soft.   LABS  Basename  10/30/11 0406   HGB  9.3  HCT  28.2   POD #3  BP: 107/70 ; Pulse: 90 ; Temp: 97.9 F (36.6 C) ; Resp: 16 Patient reports pain as mild, pain under better controlled. No events throughout the night. Ready to be discharge home. Neurovascular intact, dorsiflexion/plantar flexion intact, incision: dressing C/D/I, no cellulitis present and compartment soft.   LABS   No new labs  Discharge Exam: General appearance: alert, cooperative and no distress Extremities: Homans sign is negative, no sign of DVT, no edema, redness or tenderness in the calves or thighs and no ulcers, gangrene or trophic changes  Disposition: Home  with follow up in 2 weeks     Discharge Orders    Future Orders Please Complete By Expires   Diet - low sodium heart healthy      Call MD / Call 911      Comments:   If you experience chest pain or shortness of breath, CALL 911 and be transported to the hospital emergency room.  If you develope a fever above 101 F, pus (white drainage) or increased drainage or redness at the wound, or  calf pain, call your surgeon's office.   Discharge instructions      Comments:   Maintain surgical dressing for 8 days, then replace with gauze and tape. Keep the area dry and clean until follow up. Follow up in 2 weeks at [facility]. Call with any questions or concerns.   Constipation Prevention      Comments:   Drink plenty of fluids.  Prune juice may be helpful.  You may use a stool softener, such as Colace (over the counter) 100 mg twice a day.  Use MiraLax  (over the counter) for constipation as needed.   Increase activity slowly as tolerated      Driving restrictions      Comments:   No driving for 4 weeks   TED hose      Comments:   Use stockings (TED hose) for 2 weeks on both leg(s).  You may remove them at night for sleeping.   Change dressing      Comments:   Maintain surgical dressing for 8 days, then change the dressing daily with sterile 4 x 4 inch gauze dressing and tape. Keep the area dry and clean.      Current Discharge Medication List    START taking these medications   Details  aspirin EC 325 MG tablet Take 1 tablet (325 mg total) by mouth 2 (two) times daily. X 4 weeks Qty: 60 tablet, Refills: 0    diphenhydrAMINE (BENADRYL) 25 mg capsule Take 1 capsule (25 mg total) by mouth every 6 (six) hours as needed for itching, allergies or sleep. Qty: 30 capsule    docusate sodium 100 MG CAPS Take 100 mg by mouth 2 (two) times daily. Qty: 10 capsule    ferrous sulfate 325 (65 FE) MG tablet Take 1 tablet (325 mg total) by mouth 3 (three) times daily after meals.    methocarbamol (ROBAXIN) 500 MG tablet Take 1 tablet (500 mg total) by mouth every 6 (six) hours as needed (muscle spasms).    oxyCODONE (OXY IR/ROXICODONE) 5 MG immediate release tablet Take 1-3 tablets (5-15 mg total) by mouth every 4 (four) hours as needed for pain. Qty: 120 tablet, Refills: 0    polyethylene glycol (MIRALAX / GLYCOLAX) packet Take 17 g by mouth 2 (two) times daily. Qty: 14 each      CONTINUE these medications which have NOT CHANGED   Details  acetaminophen (TYLENOL) 500 MG tablet Take 1,000 mg by mouth every 6 (six) hours as needed. FOR HIP PAIN    atorvastatin (LIPITOR) 40 MG tablet Take 20 mg by mouth every evening.    buPROPion (WELLBUTRIN XL) 300 MG 24 hr tablet Take 300 mg by mouth every evening.    Calcium Carbonate-Vitamin D (CALCIUM + D) 600-200 MG-UNIT TABS Take 2 tablets by mouth every evening.    LORazepam (ATIVAN) 0.5 MG  tablet Take 0.5 mg by mouth every 8 (eight) hours as needed. For anxiety/sleep    omeprazole (PRILOSEC) 40 MG capsule Take 40 mg by mouth every evening.    SUMAtriptan (IMITREX) 100 MG tablet Take 100 mg by mouth every 2 (two) hours as needed. For migraine    topiramate (TOPAMAX) 50 MG tablet Take 100 mg by mouth every evening.    amoxicillin (AMOXIL) 500 MG capsule Take 2,000 mg by mouth once. Prior to dental procedure    Multiple Vitamin (MULITIVITAMIN WITH MINERALS) TABS Take 1 tablet by mouth every evening.    omega-3 acid ethyl esters (LOVAZA) 1  G capsule Take 2 g by mouth every evening.      STOP taking these medications     meloxicam (MOBIC) 15 MG tablet Comments:  Reason for Stopping:       HYDROcodone-acetaminophen (NORCO/VICODIN) 5-325 MG per tablet Comments:  Reason for Stopping:           Signed: Anastasio Auerbach. Bettie Swavely   PAC  10/31/2011, 8:09 AM

## 2011-10-31 NOTE — Progress Notes (Signed)
  Subjective: 3 Days Post-Op Procedure(s) (LRB): TOTAL KNEE REVISION (Right)   Patient reports pain as mild, pain under better controlled. No events throughout the night. Ready to be discharge home.  Objective:   VITALS:   Filed Vitals:   10/31/11 0655  BP: 107/70  Pulse: 90  Temp: 97.9 F (36.6 C)  Resp: 16    Neurovascular intact Dorsiflexion/Plantar flexion intact Incision: dressing C/D/I No cellulitis present Compartment soft  LABS  Basename 10/30/11 0406 10/29/11 0422  HGB 9.3* 10.5*  HCT 28.2* 32.0*  WBC 10.6* 11.0*  PLT 236 243     Basename 10/30/11 0406 10/29/11 0422  NA 140 140  K 4.2 3.8  BUN 17 13  CREATININE 0.74 0.73  GLUCOSE 133* 135*     Assessment/Plan: 3 Days Post-Op Procedure(s) (LRB): TOTAL KNEE REVISION (Right)   Up with therapy Discharge home with home health Follow up in 2 weeks at [facility].  Follow-up Information    Follow up with OLIN,Syon Tews D in 2 weeks.   Contact information:   [facility] [address], [address] [place] [address] [phone]             Anastasio Auerbach. Hodges Treiber   PAC  10/31/2011, 8:05 AM

## 2011-10-31 NOTE — Progress Notes (Signed)
Pt for d/c home today with [facility]HC per [facility]. IV d/c'd. Dressing CDI to R knee. Dressing supplies provided for home use. D/C instructions & RX given with verbalized understanding. No changes in am assessments. Husband will pick-up pt up for d/c.

## 2011-12-04 ENCOUNTER — Encounter (HOSPITAL_COMMUNITY): Payer: Self-pay | Admitting: Pharmacy Technician

## 2011-12-04 NOTE — Patient Instructions (Addendum)
20 Kimley Apsey  12/04/2011   Your procedure is scheduled on: 9-9  -2013  Report to [facility] at    0730    AM .  Call this number if you have problems the morning of surgery: [phone]  Or Presurgical Testing [phone]-Emelia Sandoval   Remember:   Do not eat food:After Midnight.    Take these medicines the morning of surgery with A SIP OF WATER: oxycodone, Tylenol   Do not wear jewelry, make-up or nail polish.  Do not wear lotions, powders, or perfumes. You may wear deodorant.  Do not shave 48 hours prior to surgery.(face and neck okay, no shaving of legs)  Do not bring valuables to the hospital.  Contacts, dentures or bridgework may not be worn into surgery.  Leave suitcase in the car. After surgery it may be brought to your room.  For patients admitted to the hospital, checkout time is 11:00 AM the day of discharge.   Patients discharged the day of surgery will not be allowed to drive home.  Name and phone number of your driver: Cindee Lame, sister [phone]  Special Instructions: CHG Shower Use Special Wash: 1/2 bottle night before surgery and 1/2 bottle morning of surgery.(avoid face and genitals)   Please read over the following fact sheets that you were given: MRSA Information, Blood Transfusion fact sheet, Incentive Spirometry Instruction.

## 2011-12-05 ENCOUNTER — Encounter (HOSPITAL_COMMUNITY)
Admission: RE | Admit: 2011-12-05 | Discharge: 2011-12-05 | Disposition: A | Discharge status: Home / Self Care | Payer: BC Managed Care – PPO | Source: Ambulatory Visit | Attending: Orthopedic Surgery | Admitting: Orthopedic Surgery

## 2011-12-05 ENCOUNTER — Encounter (HOSPITAL_COMMUNITY): Payer: Self-pay

## 2011-12-05 LAB — URINALYSIS, ROUTINE W REFLEX MICROSCOPIC
Bilirubin Urine: NEGATIVE
Ketones, ur: NEGATIVE mg/dL
Nitrite: NEGATIVE
Protein, ur: NEGATIVE mg/dL
Urobilinogen, UA: 1 mg/dL (ref 0.0–1.0)

## 2011-12-05 LAB — BASIC METABOLIC PANEL
GFR calc non Af Amer: 67 mL/min — ABNORMAL LOW (ref >90)
Glucose, Bld: 98 mg/dL (ref 70–99)
Potassium: 4.2 mEq/L (ref 3.5–5.1)
Sodium: 139 mEq/L (ref 135–145)

## 2011-12-05 LAB — SURGICAL PCR SCREEN: MRSA, PCR: NEGATIVE

## 2011-12-05 LAB — CBC
Hemoglobin: 11.8 g/dL — ABNORMAL LOW (ref 12.0–15.0)
MCH: 30.9 pg (ref 26.0–34.0)
RBC: 3.82 MIL/uL — ABNORMAL LOW (ref 3.87–5.11)

## 2011-12-05 LAB — PROTIME-INR
INR: 1.07 (ref 0.00–1.49)
Prothrombin Time: 14.1 seconds (ref 11.6–15.2)

## 2011-12-05 NOTE — Pre-Procedure Instructions (Addendum)
12-05-11 1225 BMP,CBC/d, Urinalysis report faxed to Dr. Nilsa Nutting PA.W. Briahnna Harries,RN 12-05-11 1500 Spoke with pt. To inform PCR screen -negative. Stated" she had a call from MD's office and med call in for UTI infection". W. Kennon Portela

## 2011-12-09 ENCOUNTER — Encounter (HOSPITAL_COMMUNITY): Payer: Self-pay | Admitting: Anesthesiology

## 2011-12-09 ENCOUNTER — Ambulatory Visit (HOSPITAL_COMMUNITY): Payer: BC Managed Care – PPO | Admitting: Anesthesiology

## 2011-12-09 ENCOUNTER — Encounter (HOSPITAL_COMMUNITY): Payer: Self-pay | Admitting: Orthopedic Surgery

## 2011-12-09 ENCOUNTER — Encounter (HOSPITAL_COMMUNITY)
Admission: RE | Disposition: A | Discharge status: Home Health | Payer: Self-pay | Source: Ambulatory Visit | Attending: Orthopedic Surgery

## 2011-12-09 ENCOUNTER — Encounter (HOSPITAL_COMMUNITY): Payer: Self-pay | Admitting: *Deleted

## 2011-12-09 ENCOUNTER — Inpatient Hospital Stay (HOSPITAL_COMMUNITY)
Admission: RE | Admit: 2011-12-09 | Discharge: 2011-12-10 | DRG: 249 | Disposition: A | Discharge status: Home Health | Payer: BC Managed Care – PPO | Source: Ambulatory Visit | Attending: Orthopedic Surgery | Admitting: Orthopedic Surgery

## 2011-12-09 DIAGNOSIS — Z87891 Personal history of nicotine dependence: Secondary | ICD-10-CM

## 2011-12-09 DIAGNOSIS — Z96659 Presence of unspecified artificial knee joint: Secondary | ICD-10-CM

## 2011-12-09 DIAGNOSIS — Z79899 Other long term (current) drug therapy: Secondary | ICD-10-CM

## 2011-12-09 DIAGNOSIS — Z96649 Presence of unspecified artificial hip joint: Secondary | ICD-10-CM

## 2011-12-09 DIAGNOSIS — M1610 Unilateral primary osteoarthritis, unspecified hip: Secondary | ICD-10-CM | POA: Diagnosis present

## 2011-12-09 DIAGNOSIS — Z6841 Body Mass Index (BMI) 40.0 and over, adult: Secondary | ICD-10-CM

## 2011-12-09 DIAGNOSIS — M7989 Other specified soft tissue disorders: Secondary | ICD-10-CM | POA: Diagnosis present

## 2011-12-09 DIAGNOSIS — Z6835 Body mass index (BMI) 35.0-35.9, adult: Secondary | ICD-10-CM

## 2011-12-09 DIAGNOSIS — Z01812 Encounter for preprocedural laboratory examination: Secondary | ICD-10-CM

## 2011-12-09 DIAGNOSIS — Y831 Surgical operation with implant of artificial internal device as the cause of abnormal reaction of the patient, or of later complication, without mention of misadventure at the time of the procedure: Secondary | ICD-10-CM | POA: Diagnosis present

## 2011-12-09 DIAGNOSIS — T8489XA Other specified complication of internal orthopedic prosthetic devices, implants and grafts, initial encounter: Principal | ICD-10-CM | POA: Diagnosis present

## 2011-12-09 DIAGNOSIS — D500 Iron deficiency anemia secondary to blood loss (chronic): Secondary | ICD-10-CM

## 2011-12-09 DIAGNOSIS — IMO0002 Reserved for concepts with insufficient information to code with codable children: Secondary | ICD-10-CM

## 2011-12-09 DIAGNOSIS — M1710 Unilateral primary osteoarthritis, unspecified knee: Secondary | ICD-10-CM | POA: Diagnosis present

## 2011-12-09 DIAGNOSIS — M25669 Stiffness of unspecified knee, not elsewhere classified: Secondary | ICD-10-CM | POA: Diagnosis present

## 2011-12-09 DIAGNOSIS — E669 Obesity, unspecified: Secondary | ICD-10-CM | POA: Diagnosis present

## 2011-12-09 DIAGNOSIS — L905 Scar conditions and fibrosis of skin: Secondary | ICD-10-CM | POA: Diagnosis present

## 2011-12-09 HISTORY — PX: IRRIGATION AND DEBRIDEMENT KNEE: SHX5185

## 2011-12-09 HISTORY — PX: KNEE CLOSED REDUCTION: SHX995

## 2011-12-09 HISTORY — PX: HEMATOMA EVACUATION: SHX5118

## 2011-12-09 LAB — TYPE AND SCREEN
ABO/RH(D): O NEG
Antibody Screen: NEGATIVE

## 2011-12-09 SURGERY — EVACUATION HEMATOMA
Anesthesia: General | Site: Knee | Laterality: Right | Wound class: Clean

## 2011-12-09 MED ORDER — LACTATED RINGERS IV SOLN
INTRAVENOUS | Status: DC
  Administered 2011-12-09: 10:00:00 via INTRAVENOUS
  Administered 2011-12-09: 1000 mL via INTRAVENOUS

## 2011-12-09 MED ORDER — PROPOFOL 10 MG/ML IV EMUL
INTRAVENOUS | Status: DC | PRN
  Administered 2011-12-09: 120 ug/kg/min via INTRAVENOUS

## 2011-12-09 MED ORDER — ZOLPIDEM TARTRATE 5 MG PO TABS
5.0000 mg | ORAL_TABLET | Freq: Every evening | ORAL | Status: DC | PRN
  Administered 2011-12-09: 5 mg via ORAL
  Filled 2011-12-09: qty 1

## 2011-12-09 MED ORDER — FENTANYL CITRATE 0.05 MG/ML IJ SOLN
INTRAMUSCULAR | Status: DC | PRN
  Administered 2011-12-09 (×2): 50 ug via INTRAVENOUS

## 2011-12-09 MED ORDER — ACETAMINOPHEN 10 MG/ML IV SOLN
INTRAVENOUS | Status: DC | PRN
  Administered 2011-12-09: 1000 mg via INTRAVENOUS

## 2011-12-09 MED ORDER — MENTHOL 3 MG MT LOZG: 1.0000 | LOZENGE | OROMUCOSAL | Status: DC | PRN

## 2011-12-09 MED ORDER — CEFAZOLIN SODIUM-DEXTROSE 2-3 GM-% IV SOLR
2.0000 g | Freq: Four times a day (QID) | INTRAVENOUS | Status: AC
  Administered 2011-12-09 (×2): 2 g via INTRAVENOUS
  Filled 2011-12-09 (×2): qty 50

## 2011-12-09 MED ORDER — SODIUM CHLORIDE 0.9 % IV SOLN
INTRAVENOUS | Status: DC
  Administered 2011-12-09 – 2011-12-10 (×2): via INTRAVENOUS
  Filled 2011-12-09 (×7): qty 1000

## 2011-12-09 MED ORDER — ONDANSETRON HCL 4 MG PO TABS: 4.0000 mg | ORAL_TABLET | Freq: Four times a day (QID) | ORAL | Status: DC | PRN

## 2011-12-09 MED ORDER — PHENOL 1.4 % MT LIQD: 1.0000 | OROMUCOSAL | Status: DC | PRN

## 2011-12-09 MED ORDER — CEFAZOLIN SODIUM-DEXTROSE 2-3 GM-% IV SOLR
INTRAVENOUS | Status: AC
  Filled 2011-12-09: qty 50

## 2011-12-09 MED ORDER — METOCLOPRAMIDE HCL 5 MG/ML IJ SOLN: 5.0000 mg | Freq: Three times a day (TID) | INTRAMUSCULAR | Status: DC | PRN

## 2011-12-09 MED ORDER — PROMETHAZINE HCL 25 MG/ML IJ SOLN: 6.2500 mg | INTRAMUSCULAR | Status: DC | PRN

## 2011-12-09 MED ORDER — CHLORHEXIDINE GLUCONATE 4 % EX LIQD: 60.0000 mL | Freq: Once | CUTANEOUS | Status: DC

## 2011-12-09 MED ORDER — FERROUS SULFATE 325 (65 FE) MG PO TABS
325.0000 mg | ORAL_TABLET | Freq: Three times a day (TID) | ORAL | Status: DC
  Administered 2011-12-09 – 2011-12-10 (×4): 325 mg via ORAL
  Filled 2011-12-09 (×6): qty 1

## 2011-12-09 MED ORDER — METHOCARBAMOL 100 MG/ML IJ SOLN
500.0000 mg | Freq: Four times a day (QID) | INTRAVENOUS | Status: DC | PRN
  Filled 2011-12-09: qty 5

## 2011-12-09 MED ORDER — FLEET ENEMA 7-19 GM/118ML RE ENEM: 1.0000 | ENEMA | Freq: Once | RECTAL | Status: AC | PRN

## 2011-12-09 MED ORDER — DEXAMETHASONE SODIUM PHOSPHATE 10 MG/ML IJ SOLN: 10.0000 mg | Freq: Once | INTRAMUSCULAR | Status: DC

## 2011-12-09 MED ORDER — HYDROMORPHONE HCL PF 1 MG/ML IJ SOLN
0.5000 mg | INTRAMUSCULAR | Status: DC | PRN
  Administered 2011-12-09 (×2): 1 mg via INTRAVENOUS
  Administered 2011-12-10 (×2): 2 mg via INTRAVENOUS
  Filled 2011-12-09: qty 1
  Filled 2011-12-09 (×2): qty 2
  Filled 2011-12-09: qty 1

## 2011-12-09 MED ORDER — TRANEXAMIC ACID 100 MG/ML IV SOLN
15.0000 mg/kg | Freq: Once | INTRAVENOUS | Status: AC
  Administered 2011-12-09: 1272 mg via INTRAVENOUS
  Filled 2011-12-09: qty 12.72

## 2011-12-09 MED ORDER — ASPIRIN EC 325 MG PO TBEC
325.0000 mg | DELAYED_RELEASE_TABLET | Freq: Two times a day (BID) | ORAL | Status: DC
  Administered 2011-12-10: 325 mg via ORAL
  Filled 2011-12-09 (×2): qty 1

## 2011-12-09 MED ORDER — PHENYLEPHRINE HCL 10 MG/ML IJ SOLN
INTRAMUSCULAR | Status: DC | PRN
  Administered 2011-12-09 (×4): 40 ug via INTRAVENOUS
  Administered 2011-12-09: 80 ug via INTRAVENOUS

## 2011-12-09 MED ORDER — DOCUSATE SODIUM 100 MG PO CAPS
100.0000 mg | ORAL_CAPSULE | Freq: Two times a day (BID) | ORAL | Status: DC
  Administered 2011-12-09 – 2011-12-10 (×3): 100 mg via ORAL

## 2011-12-09 MED ORDER — FENTANYL CITRATE 0.05 MG/ML IJ SOLN
INTRAMUSCULAR | Status: AC
  Filled 2011-12-09: qty 2

## 2011-12-09 MED ORDER — BISACODYL 10 MG RE SUPP: 10.0000 mg | Freq: Every day | RECTAL | Status: DC | PRN

## 2011-12-09 MED ORDER — ACETAMINOPHEN 10 MG/ML IV SOLN
INTRAVENOUS | Status: AC
  Filled 2011-12-09: qty 100

## 2011-12-09 MED ORDER — EPHEDRINE SULFATE 50 MG/ML IJ SOLN
INTRAMUSCULAR | Status: DC | PRN
  Administered 2011-12-09 (×2): 5 mg via INTRAVENOUS
  Administered 2011-12-09: 10 mg via INTRAVENOUS
  Administered 2011-12-09: 5 mg via INTRAVENOUS

## 2011-12-09 MED ORDER — HYDROMORPHONE HCL PF 1 MG/ML IJ SOLN: 0.2500 mg | INTRAMUSCULAR | Status: DC | PRN

## 2011-12-09 MED ORDER — METOCLOPRAMIDE HCL 10 MG PO TABS: 5.0000 mg | ORAL_TABLET | Freq: Three times a day (TID) | ORAL | Status: DC | PRN

## 2011-12-09 MED ORDER — ALUM & MAG HYDROXIDE-SIMETH 200-200-20 MG/5ML PO SUSP: 30.0000 mL | ORAL | Status: DC | PRN

## 2011-12-09 MED ORDER — ONDANSETRON HCL 4 MG/2ML IJ SOLN: 4.0000 mg | Freq: Four times a day (QID) | INTRAMUSCULAR | Status: DC | PRN

## 2011-12-09 MED ORDER — CEFAZOLIN SODIUM-DEXTROSE 2-3 GM-% IV SOLR
2.0000 g | INTRAVENOUS | Status: AC
  Administered 2011-12-09: 2 g via INTRAVENOUS

## 2011-12-09 MED ORDER — MIDAZOLAM HCL 5 MG/5ML IJ SOLN
INTRAMUSCULAR | Status: DC | PRN
  Administered 2011-12-09 (×3): 1 mg via INTRAVENOUS

## 2011-12-09 MED ORDER — SODIUM CHLORIDE 0.9 % IR SOLN
Status: DC | PRN
  Administered 2011-12-09: 3000 mL

## 2011-12-09 MED ORDER — OXYCODONE HCL 5 MG PO TABS
5.0000 mg | ORAL_TABLET | ORAL | Status: DC
  Administered 2011-12-09: 10 mg via ORAL
  Administered 2011-12-09 (×2): 15 mg via ORAL
  Administered 2011-12-09: 10 mg via ORAL
  Administered 2011-12-10 (×3): 15 mg via ORAL
  Filled 2011-12-09: qty 3
  Filled 2011-12-09: qty 2
  Filled 2011-12-09: qty 3
  Filled 2011-12-09: qty 2
  Filled 2011-12-09 (×3): qty 3

## 2011-12-09 MED ORDER — METHOCARBAMOL 500 MG PO TABS
500.0000 mg | ORAL_TABLET | Freq: Four times a day (QID) | ORAL | Status: DC | PRN
  Administered 2011-12-09 – 2011-12-10 (×2): 500 mg via ORAL
  Filled 2011-12-09 (×2): qty 1

## 2011-12-09 MED ORDER — ONDANSETRON HCL 4 MG/2ML IJ SOLN
INTRAMUSCULAR | Status: DC | PRN
  Administered 2011-12-09: 4 mg via INTRAVENOUS

## 2011-12-09 MED ORDER — POLYETHYLENE GLYCOL 3350 17 G PO PACK
17.0000 g | PACK | Freq: Two times a day (BID) | ORAL | Status: DC
  Administered 2011-12-09 – 2011-12-10 (×2): 17 g via ORAL

## 2011-12-09 SURGICAL SUPPLY — 49 items
ADH SKN CLS APL DERMABOND .7 (GAUZE/BANDAGES/DRESSINGS) ×1
BAG SPEC THK2 15X12 ZIP CLS (MISCELLANEOUS) ×1
BAG ZIPLOCK 12X15 (MISCELLANEOUS) ×2 IMPLANT
BANDAGE ADHESIVE 1X3 (GAUZE/BANDAGES/DRESSINGS) IMPLANT
BANDAGE ELASTIC 6 VELCRO ST LF (GAUZE/BANDAGES/DRESSINGS) ×1 IMPLANT
BANDAGE ESMARK 6X9 LF (GAUZE/BANDAGES/DRESSINGS) ×1 IMPLANT
BANDAGE GAUZE ELAST BULKY 4 IN (GAUZE/BANDAGES/DRESSINGS) ×2 IMPLANT
BNDG CMPR 9X6 STRL LF SNTH (GAUZE/BANDAGES/DRESSINGS) ×1
BNDG ESMARK 6X9 LF (GAUZE/BANDAGES/DRESSINGS) ×2
CLOTH BEACON ORANGE TIMEOUT ST (SAFETY) ×2 IMPLANT
CUFF TOURN SGL QUICK 18 (TOURNIQUET CUFF) ×1 IMPLANT
CUFF TOURN SGL QUICK 24 (TOURNIQUET CUFF)
CUFF TOURN SGL QUICK 34 (TOURNIQUET CUFF) ×2
CUFF TRNQT CYL 24X4X40X1 (TOURNIQUET CUFF) ×1 IMPLANT
CUFF TRNQT CYL 34X4X40X1 (TOURNIQUET CUFF) ×1 IMPLANT
DERMABOND ADVANCED (GAUZE/BANDAGES/DRESSINGS) ×1
DERMABOND ADVANCED .7 DNX12 (GAUZE/BANDAGES/DRESSINGS) IMPLANT
DRAIN PENROSE 18X1/2 LTX STRL (DRAIN) ×1 IMPLANT
DRSG PAD ABDOMINAL 8X10 ST (GAUZE/BANDAGES/DRESSINGS) ×3 IMPLANT
DRSG TEGADERM 4X4.75 (GAUZE/BANDAGES/DRESSINGS) ×1 IMPLANT
DURAPREP 26ML APPLICATOR (WOUND CARE) ×2 IMPLANT
ELECT REM PT RETURN 9FT ADLT (ELECTROSURGICAL) ×2
ELECTRODE REM PT RTRN 9FT ADLT (ELECTROSURGICAL) ×1 IMPLANT
GAUZE SPONGE 2X2 8PLY STRL LF (GAUZE/BANDAGES/DRESSINGS) IMPLANT
GLOVE BIOGEL PI IND STRL 7.5 (GLOVE) ×1 IMPLANT
GLOVE BIOGEL PI IND STRL 8 (GLOVE) ×1 IMPLANT
GLOVE BIOGEL PI INDICATOR 7.5 (GLOVE) ×1
GLOVE BIOGEL PI INDICATOR 8 (GLOVE) ×1
GLOVE ECLIPSE 8.0 STRL XLNG CF (GLOVE) IMPLANT
GLOVE ORTHO TXT STRL SZ7.5 (GLOVE) ×4 IMPLANT
GLOVE SURG ORTHO 8.0 STRL STRW (GLOVE) ×2 IMPLANT
GOWN BRE IMP PREV XXLGXLNG (GOWN DISPOSABLE) ×4 IMPLANT
GOWN STRL NON-REIN LRG LVL3 (GOWN DISPOSABLE) ×2 IMPLANT
HANDPIECE INTERPULSE COAX TIP (DISPOSABLE) ×2
IMMOBILIZER KNEE 22 UNIV (SOFTGOODS) ×1 IMPLANT
KIT BASIN OR (CUSTOM PROCEDURE TRAY) ×2 IMPLANT
MANIFOLD NEPTUNE II (INSTRUMENTS) ×2 IMPLANT
NDL SAFETY ECLIPSE 18X1.5 (NEEDLE) IMPLANT
NEEDLE HYPO 18GX1.5 SHARP (NEEDLE)
PACK LOWER EXTREMITY WL (CUSTOM PROCEDURE TRAY) ×2 IMPLANT
PAD CAST 4YDX4 CTTN HI CHSV (CAST SUPPLIES) ×1 IMPLANT
PADDING CAST COTTON 4X4 STRL (CAST SUPPLIES) ×2
POSITIONER SURGICAL ARM (MISCELLANEOUS) ×2 IMPLANT
SET HNDPC FAN SPRY TIP SCT (DISPOSABLE) ×1 IMPLANT
SOL PREP PROV IODINE SCRUB 4OZ (MISCELLANEOUS) ×1 IMPLANT
SPONGE GAUZE 2X2 STER 10/PKG (GAUZE/BANDAGES/DRESSINGS) ×1
SPONGE GAUZE 4X4 12PLY (GAUZE/BANDAGES/DRESSINGS) ×2 IMPLANT
SYR CONTROL 10ML LL (SYRINGE) ×2 IMPLANT
TOWEL OR 17X26 10 PK STRL BLUE (TOWEL DISPOSABLE) ×4 IMPLANT

## 2011-12-09 NOTE — Transfer of Care (Signed)
Immediate Anesthesia Transfer of Care Note  Patient: Melissa Leach  Procedure(s) Performed: Procedure(s) (LRB) with comments: EVACUATION HEMATOMA (Right) - Evacuation of Hematoma Right Knee, I & D and Closed Manipulation of Right Knee IRRIGATION AND DEBRIDEMENT KNEE (Right) CLOSED MANIPULATION KNEE (Right)  Patient Location: PACU  Anesthesia Type: MAC and Spinal  Level of Consciousness: awake, alert , patient cooperative and responds to stimulation  Airway & Oxygen Therapy: Patient Spontanous Breathing and Patient connected to face mask oxygen  Post-op Assessment: Report given to PACU RN, Post -op Vital signs reviewed and stable  Post vital signs: Reviewed and stable  Complications: No apparent anesthesia complications

## 2011-12-09 NOTE — Interval H&P Note (Signed)
History and Physical Interval Note:  12/09/2011 9:03 AM  Melissa Leach  has presented today for surgery, with the diagnosis of post op  hematoma, right total knee with stiffness  The various methods of treatment have been discussed with the patient and family. After consideration of risks, benefits and other options for treatment, the patient has consented to  Procedure(s) (LRB) with comments: EVACUATION HEMATOMA (Right) - Evacuation of Hematoma Right Knee, I & D and Closed Manipulation of Right Knee IRRIGATION AND DEBRIDEMENT KNEE (Right) CLOSED MANIPULATION KNEE (Right) as a surgical intervention .  The patient's history has been reviewed, patient examined, no change in status, stable for surgery.  I have reviewed the patient's chart and labs.  Questions were answered to the patient's satisfaction.     [name]

## 2011-12-09 NOTE — Anesthesia Preprocedure Evaluation (Signed)
Anesthesia Evaluation  Patient identified by MRN, date of birth, ID band Patient awake    Reviewed: Allergy & Precautions, H&P , NPO status , Patient's Chart, lab work & pertinent test results  Airway Mallampati: II TM Distance: <3 FB Neck ROM: Full    Dental No notable dental hx.    Pulmonary neg pulmonary ROS,  breath sounds clear to auscultation  Pulmonary exam normal       Cardiovascular negative cardio ROS  Rhythm:Regular Rate:Normal     Neuro/Psych negative neurological ROS  negative psych ROS   GI/Hepatic Neg liver ROS, hiatal hernia,   Endo/Other  negative endocrine ROS  Renal/GU negative Renal ROS  negative genitourinary   Musculoskeletal negative musculoskeletal ROS (+)   Abdominal   Peds negative pediatric ROS (+)  Hematology negative hematology ROS (+)   Anesthesia Other Findings   Reproductive/Obstetrics negative OB ROS                           Anesthesia Physical Anesthesia Plan  ASA: II  Anesthesia Plan: General and Spinal   Post-op Pain Management:    Induction: Intravenous  Airway Management Planned: LMA  Additional Equipment:   Intra-op Plan:   Post-operative Plan:   Informed Consent: I have reviewed the patients History and Physical, chart, labs and discussed the procedure including the risks, benefits and alternatives for the proposed anesthesia with the patient or authorized representative who has indicated his/her understanding and acceptance.   Dental advisory given  Plan Discussed with: CRNA and Surgeon  Anesthesia Plan Comments:         Anesthesia Quick Evaluation

## 2011-12-09 NOTE — Brief Op Note (Signed)
12/09/2011  10:44 AM  PATIENT:  Melissa Leach  61 y.o. female  PRE-OPERATIVE DIAGNOSIS:  post op  hematoma, right total knee with stiffness  POST-OPERATIVE DIAGNOSIS:  post op  hematoma, right total knee with stiffness  PROCEDURE:  Procedure(s) (LRB) with comments: EVACUATION HEMATOMA (Right) - Evacuation of Hematoma Right Knee, I & D and Closed Manipulation of Right Knee IRRIGATION AND DEBRIDEMENT KNEE (Right) CLOSED MANIPULATION KNEE (Right)  Excisional debridement with scalpel of skin, subcutaneous tissue, evacuation of hematoma  SURGEON:  Surgeon(s) and Role:    * [name], MD - Primary  PHYSICIAN ASSISTANT:  Lanney Gins, PA-C  ANESTHESIA:   spinal  EBL:  Total I/O In: 1000 [I.V.:1000] Out: -   BLOOD ADMINISTERED:none  DRAINS: (1 medium) Hemovact drain(s) in the right knee with  Suction Open   LOCAL MEDICATIONS USED:  NONE  SPECIMEN:  No Specimen  DISPOSITION OF SPECIMEN:  N/A  COUNTS:  YES  TOURNIQUET:   Total Tourniquet Time Documented: Thigh (Right) - 6 minutes  DICTATION: .Other Dictation: Dictation Number [id number]  PLAN OF CARE: Admit to inpatient   PATIENT DISPOSITION:  PACU - hemodynamically stable.   Delay start of Pharmacological VTE agent (>24hrs) due to surgical blood loss or risk of bleeding: no

## 2011-12-09 NOTE — H&P (Signed)
Melissa Leach is an 61 y.o. female.    Chief Complaint:   Right knee hematoma S/P right total knee arthroplasty with stiffness.  HPI: Pt is a 61 y.o. female complaining of right knee pain for years. She had her original total knee arthroplasty in 2001. Do to some pain and after an examination it was determined that the femoral component had loosened and she had a revision of the femoral component in 2002 at [facility]. Pain had continually increased since the beginning. Once she recovered from other surgeries including a left total hip arthroplasty, her pain persisted. Studies reveal possible loosening of the tibial component of the right knee. She had a right total knee revision on 10/28/2011 per Dr. Charlann Boxer at [facility]. Since that time she has had some stiffness and pain in the right knee, when seen in the office it is believed that she has a superficial hematoma that should be cleaned out along with breaking up some of the scar tissue with a manipulation. Various options are discussed with the patient. Risks, benefits and expectations were discussed with the patient. Patient understand the risks, benefits and expectations and wishes to proceed with surgery.   PCP:  Emeterio [name], MD  D/C Plans:  Home with HHPT  Post-op Meds:  No Rx given   Tranexamic Acid:   To be given  Decadron:   To be given  PMH: Past Medical History  Diagnosis Date  . Gastritis     HISTORY OF GASTRITIS--OCCAS ABDOMINAL PAIN   . H/O hiatal hernia   . Headache     MIGRAINES  . Arthritis     PAIN IN BOTH HIPS AND KNEES  . Anxiety   . Depression   . Complication of anesthesia     "SCRATCHED CORNEA"  WAKING UP FROM  KNEE SURGERY--'82    PSH: Past Surgical History  Procedure Date  . Joint replacement     RIGHT TOTAL KNEE REPLACEMENT AND RT TOTAL KNEE REVISION    AND  LEFT TOTAL KNEE REPLACEMENT  . Tubal ligation   . Tonsillectomy   . Total hip arthroplasty 06/25/2011    Procedure: TOTAL HIP  ARTHROPLASTY ANTERIOR APPROACH;  Surgeon: [name], MD;  Location: [facility] ORS;  Service: Orthopedics;  Laterality: Left;  Marland Kitchen Eye surgery 1994    RA surgery-  . Total knee revision 10/28/2011    Procedure: TOTAL KNEE REVISION;  Surgeon: [name], MD;  Location: [facility] ORS;  Service: Orthopedics;  Laterality: Right;    Social History:  reports that she has quit smoking. She quit smokeless tobacco use about 35 years ago. She reports that she does not drink alcohol or use illicit drugs.  Allergies:  Allergies  Allergen Reactions  . Sulfa Antibiotics Rash    Medications: Current Outpatient Prescriptions  Medication Sig Dispense Refill  . acetaminophen (TYLENOL) 500 MG tablet Take 1,000 mg by mouth every 6 (six) hours as needed. FOR HIP PAIN      . amoxicillin (AMOXIL) 500 MG capsule Take 2,000 mg by mouth once. Prior to dental procedure      . atorvastatin (LIPITOR) 40 MG tablet Take 20 mg by mouth every evening.      Marland Kitchen buPROPion (WELLBUTRIN XL) 300 MG 24 hr tablet Take 300 mg by mouth every evening.      . diphenhydrAMINE (BENADRYL) 25 mg capsule Take 25 mg by mouth every 6 (six) hours as needed. For itching      . docusate  sodium (COLACE) 100 MG capsule Take 100 mg by mouth 2 (two) times daily.      Marland Kitchen LORazepam (ATIVAN) 0.5 MG tablet Take 0.5 mg by mouth every 8 (eight) hours as needed. For anxiety/sleep      . methocarbamol (ROBAXIN) 500 MG tablet Take 500 mg by mouth every 6 (six) hours as needed. For muscle relaxer      . Multiple Vitamin (MULTIVITAMIN WITH MINERALS) TABS Take 1 tablet by mouth daily.      Marland Kitchen omeprazole (PRILOSEC) 40 MG capsule Take 40 mg by mouth every evening.      Marland Kitchen oxycodone (OXY-IR) 5 MG capsule Take 5-10 mg by mouth every 6 (six) hours as needed. For pain      . SUMAtriptan (IMITREX) 100 MG tablet Take 100 mg by mouth every 2 (two) hours as needed. For migraine      . topiramate (TOPAMAX) 50 MG tablet Take 100 mg by mouth every evening.        ROS: Review of  Systems  Constitutional: Negative.   HENT: Negative.   Eyes: Negative.   Respiratory: Negative.   Cardiovascular: Negative.   Gastrointestinal: Negative.   Genitourinary: Negative.   Musculoskeletal: Positive for joint pain.  Skin: Negative.   Neurological: Negative.   Endo/Heme/Allergies: Negative.   Psychiatric/Behavioral: Negative.      Physical Exam: Physical Exam  Constitutional: She is oriented to person, place, and time and well-developed, well-nourished, and in no distress.  HENT:  Head: Normocephalic and atraumatic.  Nose: Nose normal.  Mouth/Throat: Oropharynx is clear and moist.  Eyes: Pupils are equal, round, and reactive to light.  Neck: Neck supple. No JVD present. No tracheal deviation present. No thyromegaly present.  Cardiovascular: Normal rate, regular rhythm, normal heart sounds and intact distal pulses.   Pulmonary/Chest: Effort normal and breath sounds normal. No stridor.  Abdominal: Soft. There is no tenderness. There is no guarding.  Musculoskeletal:       Right knee: She exhibits decreased range of motion, swelling and laceration (healed from previous surgery). She exhibits no deformity. tenderness found.  Lymphadenopathy:    She has no cervical adenopathy.  Neurological: She is alert and oriented to person, place, and time.  Skin: Skin is warm and dry.  Psychiatric: Affect normal.     Assessment/Plan Assessment:   Right knee hematoma S/P right total knee arthroplasty with stiffness.  Plan: Patient will undergo a evacuation of right knee superficial hematoma along with manipulation of the right knee on 12/09/2011 per Dr. Charlann Boxer at [facility]. Risks benefits and expectations were discussed with the patient. Patient understand risks, benefits and expectations and wishes to proceed.   Anastasio Auerbach Teniya Filter   PAC  12/09/2011, 12:05 AM

## 2011-12-09 NOTE — Anesthesia Procedure Notes (Signed)

## 2011-12-09 NOTE — Anesthesia Postprocedure Evaluation (Signed)
  Anesthesia Post-op Note  Patient: Melissa Leach  Procedure(s) Performed: Procedure(s) (LRB): EVACUATION HEMATOMA (Right) IRRIGATION AND DEBRIDEMENT KNEE (Right) CLOSED MANIPULATION KNEE (Right)  Patient Location: PACU  Anesthesia Type: Spinal  Level of Consciousness: awake and alert   Airway and Oxygen Therapy: Patient Spontanous Breathing  Post-op Pain: mild  Post-op Assessment: Post-op Vital signs reviewed, Patient's Cardiovascular Status Stable, Respiratory Function Stable, Patent Airway and No signs of Nausea or vomiting  Post-op Vital Signs: stable  Complications: No apparent anesthesia complications

## 2011-12-10 ENCOUNTER — Encounter (HOSPITAL_COMMUNITY): Payer: Self-pay | Admitting: Orthopedic Surgery

## 2011-12-10 DIAGNOSIS — D500 Iron deficiency anemia secondary to blood loss (chronic): Secondary | ICD-10-CM

## 2011-12-10 DIAGNOSIS — E669 Obesity, unspecified: Secondary | ICD-10-CM

## 2011-12-10 LAB — CBC
MCH: 30.5 pg (ref 26.0–34.0)
MCV: 96.9 fL (ref 78.0–100.0)
Platelets: 255 10*3/uL (ref 150–400)
RBC: 3.25 MIL/uL — ABNORMAL LOW (ref 3.87–5.11)

## 2011-12-10 LAB — BASIC METABOLIC PANEL
CO2: 23 mEq/L (ref 19–32)
Calcium: 8.6 mg/dL (ref 8.4–10.5)
Creatinine, Ser: 0.81 mg/dL (ref 0.50–1.10)
Glucose, Bld: 114 mg/dL — ABNORMAL HIGH (ref 70–99)

## 2011-12-10 MED ORDER — METHOCARBAMOL 500 MG PO TABS: 500.0000 mg | ORAL_TABLET | Freq: Four times a day (QID) | ORAL | Status: AC | PRN

## 2011-12-10 MED ORDER — POLYETHYLENE GLYCOL 3350 17 G PO PACK: 17.0000 g | PACK | Freq: Two times a day (BID) | ORAL | Status: AC

## 2011-12-10 MED ORDER — ASPIRIN 325 MG PO TBEC: 325.0000 mg | DELAYED_RELEASE_TABLET | Freq: Two times a day (BID) | ORAL | Status: AC

## 2011-12-10 MED ORDER — OXYCODONE HCL 5 MG PO TABS: 5.0000 mg | ORAL_TABLET | ORAL | Status: AC | PRN

## 2011-12-10 MED ORDER — FERROUS SULFATE 325 (65 FE) MG PO TABS: 325.0000 mg | ORAL_TABLET | Freq: Three times a day (TID) | ORAL | Status: DC

## 2011-12-10 MED ORDER — DSS 100 MG PO CAPS: 100.0000 mg | ORAL_CAPSULE | Freq: Two times a day (BID) | ORAL | Status: AC

## 2011-12-10 NOTE — Op Note (Signed)
NAMESAIYA, CRIST                ACCOUNT NO.:  [id number]  MEDICAL RECORD NO.:  [id number]  LOCATION:  1606                         FACILITY:  [facility]  PHYSICIAN:  Madlyn Frankel. Charlann Boxer, M.D.  DATE OF BIRTH:  27-Aug-1950  DATE OF PROCEDURE:  12/09/2011 DATE OF DISCHARGE:                              OPERATIVE REPORT   PREOPERATIVE DIAGNOSES: 1. Status post revision right total knee replacement with a     superficial extra-articular hematoma resulting in swelling and     persistent pain in knee. 2. Persistent pain and stiffness in the right knee. 3. Hypersensitivity over the anteromedial aspect of the knee     consistent with a scar down saphenous nerve branch.  POSTOPERATIVE DIAGNOSES: 1. Status post revision right total knee replacement with a     superficial extra-articular hematoma resulting in swelling and     persistent pain in knee. 2. Persistent pain and stiffness in the right knee. 3. Hypersensitivity over the anteromedial aspect of the knee     consistent with a scar down saphenous nerve branch.  PROCEDURE: 1. Excisional debridement, evacuation of right knee extra-articular     hematoma excising skin and subcutaneous tissue, measuring     approximately 10 cm in length, followed by irrigation with 3 L of     normal saline solution. 2. Examination and manipulation under anesthesia of the right knee.  FINDINGS:  The patient's preoperative range of motion was close to 0, full extension and flexion to about 80 degrees.  Following manipulation, I was able to lyse adhesions and get her back to 120 degrees.  Knee ligaments appear to be stable from extension to flexion with spinal block in place.  In addition, findings included a retained hematoma, which was old clotted blood as well as a seromatous material consistent with a breakdown of this hematoma.  No signs of infection.  SURGEON:  Madlyn Frankel. Charlann Boxer, M.D.  ASSISTANT:  Lanney Gins, PA.  Please note that [name] was  present for the entirety of the case, utilized for management of the extremity, retraction, as well as primary wound closure, general facilitation of the case.  ANESTHESIA:  Spinal.  SPECIMENS:  None.  COMPLICATIONS:  None.  DRAINS:  One medium Hemovac.  TOURNIQUET TIME:  6 minutes at 250 mmHg.  INDICATION FOR PROCEDURE:  Ms. Lenahan is a 61 year old patient of mine following revision of her right total knee replacement.  Her postoperative course was complicated by the fact that she did not have insurance coverage for physical therapy, trying to work on her own.  She has had increasing pain and swelling on the right leg and on exam was noted to have a large extra-articular hematoma.  Given the slower progression and all 3 findings of a large extra-articular hematoma, limited range of motion, as well as hypersensitivity over the anterior aspect of the knee, we discussed the options.  I felt that at this point, it was best for her to undergo an evacuation of this hematoma, decrease some of the anterior knee swelling, as well as an exam and manipulation of the knee to help with the range of motion given limited access to physical therapy.  In addition, by excising this area, I would be able to debride some subcutaneous tissue free at the medial aspect of the knee to try to eliminate some of this hypersensitivity.  After reviewing this and options, she wished at this point to proceed.  Risks and benefits were discussed including recurrence of stiffness and persistent pain over the anterior aspect knee requiring further treatment and recommendations.  PROCEDURE IN DETAIL:  The patient was brought to the operative theater. Once adequate anesthesia was established, preoperative antibiotics, Ancef 2 g administered, she was positioned supine with the right thigh tourniquet placed.  Right lower extremity was then prepped and draped in sterile fashion.  A time-out was performed identifying  the patient, planned procedures, and extremity.  Initially I examined the knee and found the range of motion to be noted above with almost full knee extension and flexion to about 80 degrees. I then applied pressure with the hip flexed the knee at 90 degrees, over the proximal tibia I was able to lyse adhesions and get it flexed back to 120 degrees.  Further examination revealed intact extensor mechanism and ligaments.  Following this portion of procedure, I excised about a 10 cm portion of her old incision, and sharply debrided with a scalpel subcutaneous tissue including the medial aspect of proximal tibia region.  When we incised the skin and subcutaneous tissue, we evacuated immediately a seromatous material, no signs of infection.  Following this, the extraarticular region up for dissection.  I did evacuate old hematoma and old clotted blood.  I then irrigated the wound out with 3 L of normal saline solution.  Once this was done, I let the tourniquet down and tried to obtain hemostasis, but there was lot of punctate bleeding from her healing tissues.  Once I was satisfied with this portion of procedure, I placed a Hemovac drain deep, reapproximated the skin using a combination of 2-0 Vicryl, running 4-0 Monocryl.  Then knee was then cleaned, dried, and dressed sterilely using Dermabond, Aquacel dressing, and a bulky sterile wrap with a knee immobilizer to apply compression and Hemovac drain was applied to suction.  PLAN:  Remain in hospital overnight for antibiotics.  We will follow Hemovac output, if it diminishes significantly, we will pull the drain prior to discharge tomorrow.  Otherwise, we will continue to work with physical therapy to maximize her overall function return.     Madlyn Frankel Charlann Boxer, M.D.     MDO/MEDQ  D:  12/09/2011  T:  12/10/2011  Job:  [id number]

## 2011-12-10 NOTE — Progress Notes (Signed)
   Subjective: 1 Day Post-Op Procedure(s) (LRB): EVACUATION HEMATOMA (Right) IRRIGATION AND DEBRIDEMENT KNEE (Right) CLOSED MANIPULATION KNEE (Right)   Patient reports pain as moderate, she is having more pain in the right hip versus the right knee. She has known arthritis of the right hip and believes that she will eventually need a right THA. No events throughout the night. Ready to be discharged home.  Objective:   VITALS:   Filed Vitals:   12/10/11 0645  BP: 110/74  Pulse: 100  Temp: 98.6 F (37 C)  Resp: 16    Neurovascular intact Dorsiflexion/Plantar flexion intact Incision: dressing C/D/I No cellulitis present Compartment soft  LABS  Basename 12/10/11 0405  HGB 9.9*  HCT 31.5*  WBC 7.7  PLT 255     Basename 12/10/11 0405  NA 140  K 4.1  BUN 11  CREATININE 0.81  GLUCOSE 114*     Assessment/Plan: 1 Day Post-Op Procedure(s) (LRB): EVACUATION HEMATOMA (Right) IRRIGATION AND DEBRIDEMENT KNEE (Right) CLOSED MANIPULATION KNEE (Right) HV drain d/ce'd Foley cath d/c'ed  Advance diet Up with therapy D/C IV fluids Discharge home with home health Follow up in 2 weeks at [facility].  Follow-up Information    Follow up with OLIN,Arjen Deringer D in 2 weeks.   Contact information:   [facility] [address] [place] [address] [phone]           ABLA  Treated with iron and will observe  Obese (BMI 30-39.9) Estimated Body mass index is 35.14 kg/(m^2) as calculated from the following:   Height as of this encounter: 5\' 1" (1.549 m).   Weight as of this encounter: 186 lb(84.369 kg). Patient also counseled that weight may inhibit the healing process Patient counseled that losing weight will help with future health issues    Anastasio Auerbach. Daden Mahany   PAC  12/10/2011, 8:28 AM

## 2011-12-10 NOTE — Evaluation (Signed)
Physical Therapy One Time Evaluation Patient Details Name: Melissa Leach MRN: [id number] DOB: January 19, 1951 Today's Date: 12/10/2011 Time: 7829-5621 PT Time Calculation (min): 20 min  PT Assessment / Plan / Recommendation Clinical Impression  Pt s/p R hematoma evacuation, R knee I&D, and closed manipulation.  Pt reports she is familar with therapy and can perform step well.  Pt ambulated in hallway with RW and performed exercises, also given exercise handout as pt reports she only has 5 HHPT visits left this year.  Pt plans to return home today to continue caring for husband with cancer.  Pt reports her friends will be assisting her as needed. Pt had no further question/concerns about d/c home.    PT Assessment  All further PT needs can be met in the next venue of care    Follow Up Recommendations  Home health PT    Barriers to Discharge        Equipment Recommendations  None recommended by PT    Recommendations for Other Services     Frequency      Precautions / Restrictions Precautions Precautions: Knee Restrictions RLE Weight Bearing: Weight bearing as tolerated   Pertinent Vitals/Pain 4/10 R knee, premedicated, ice applied      Mobility  Bed Mobility Bed Mobility: Not assessed Details for Bed Mobility Assistance: pt in recliner on arrival Transfers Transfers: Stand to Sit;Sit to Stand Sit to Stand: 5: Supervision;With armrests;From chair/3-in-1 Stand to Sit: 5: Supervision;With armrests;To chair/3-in-1 Ambulation/Gait Ambulation/Gait Assistance: 5: Supervision Ambulation Distance (Feet): 80 Feet Assistive device: Rolling walker Ambulation/Gait Assistance Details: pt does well with RW Gait Pattern: Step-to pattern;Antalgic    Exercises Total Joint Exercises Quad Sets: Right;15 reps;Seated;AROM;Other (comment) (with pillow under ankle) Heel Slides: AROM;Right;20 reps;Seated Hip ABduction/ADduction: AROM;Strengthening;Right;15 reps;Supine Straight Leg Raises:  AAROM;Strengthening;Right;15 reps;Supine;AROM (pt able to perform 5 actively ) [employer]: AROM;Strengthening;Right;10 reps;Seated   PT Diagnosis:    PT Problem List: Decreased range of motion;Decreased strength;Decreased mobility;Pain PT Treatment Interventions:     PT Goals    Visit Information  Last PT Received On: 12/10/11 Assistance Needed: +1    Subjective Data  Subjective: This is the 3rd surgery on this knee.   Prior Functioning  Home Living Lives With: Spouse Available Help at Discharge: Friend(s) Type of Home: House Home Access: Stairs to enter Secretary/administrator of Steps: 1 Home Layout: One level Home Adaptive Equipment: Bedside commode/3-in-1;Straight cane;Walker - rolling Prior Function Level of Independence: Independent with assistive device(s) Communication Communication: No difficulties    Cognition  Overall Cognitive Status: Appears within functional limits for tasks assessed/performed Arousal/Alertness: Awake/alert Orientation Level: Appears intact for tasks assessed Behavior During Session: [facility] for tasks performed    Extremity/Trunk Assessment Right Upper Extremity Assessment RUE ROM/Strength/Tone: [facility] for tasks assessed Left Upper Extremity Assessment LUE ROM/Strength/Tone: WFL for tasks assessed Right Lower Extremity Assessment RLE ROM/Strength/Tone: Deficits RLE ROM/Strength/Tone Deficits: good quad contraction, R knee AROM -3-45* with seated heel slide Left Lower Extremity Assessment LLE ROM/Strength/Tone: WFL for tasks assessed   Balance    End of Session PT - End of Session Activity Tolerance: Patient tolerated treatment well Patient left: in chair;with call bell/phone within reach  GP     Tara Wich,KATHrine E 12/10/2011, 10:13 AM Pager: [phone]

## 2011-12-10 NOTE — Care Management Note (Signed)
    Page 1 of 2   12/10/2011     1:31:14 PM   CARE MANAGEMENT NOTE 12/10/2011  Patient:  OSMARA, DRUMMONDS   Account Number:  [id number]  Date Initiated:  12/10/2011  Documentation initiated by:  Colleen Can  Subjective/Objective Assessment:   dx excisionjal debridemnt, evacuation of rt knee extra-articular hematoma, exam under anesthesia, mamipulation and lysis of adhesions     Action/Plan:   CM spoke with patient. Plans are for patient to return to her home. States husband is ill but daughter will be comming into to care for her tomorrow. Has support from friends till daughter arrives. Already has DME.   Anticipated DC Date:  12/10/2011   Anticipated DC Plan:  HOME/SELF CARE  In-house referral  NA      DC Planning Services  CM consult      PAC Choice  NA   Choice offered to / List presented to:  NA   DME arranged  NA      DME agency  NA     HH arranged  NA      HH agency  NA   Status of service:  Completed, signed off Medicare Important Message given?  NO (If response is "NO", the following Medicare IM given date fields will be blank) Date Medicare IM given:   Date Additional Medicare IM given:    Discharge Disposition:  HOME/SELF CARE  Per UR Regulation:  Reviewed for med. necessity/level of care/duration of stay  If discussed at Long Length of Stay Meetings, dates discussed:    Comments:  12/10/2011 Raynelle Bring BSN CCM [phone] Pt states she only has 5 HHPT visits left for this yr so plans to do outpatient physical therapy. States she will follow up with doctor's office.

## 2011-12-11 NOTE — Discharge Summary (Signed)
Physician Discharge Summary  Patient ID: Melissa Leach MRN: [id number] DOB/AGE: 10-04-1950 61 y.o.  Admit date: 12/09/2011 Discharge date: 12/10/2011   Procedures:  Procedure(s) (LRB): EVACUATION HEMATOMA (Right) IRRIGATION AND DEBRIDEMENT KNEE (Right) CLOSED MANIPULATION KNEE (Right)  Attending Physician:  Dr. Durene Romans   Admission Diagnoses:   Right knee hematoma S/P right total knee arthroplasty with stiffness.  Discharge Diagnoses:  Principal Problem:  *Hematoma - postop right knee Active Problems:  Obese  Expected blood loss anemia Gastritis   H/O hiatal hernia   Headache   Arthritis   Anxiety   Depression   HPI:  Pt is a 61 y.o. female complaining of right knee pain for years. She had her original total knee arthroplasty in 2001. Do to some pain and after an examination it was determined that the femoral component had loosened and she had a revision of the femoral component in 2002 at [facility]. Pain had continually increased since the beginning. Once she recovered from other surgeries including a left total hip arthroplasty, her pain persisted. Studies reveal possible loosening of the tibial component of the right knee. She had a right total knee revision on 10/28/2011 per Dr. Charlann Boxer at [facility]. Since that time she has had some stiffness and pain in the right knee, when seen in the office it is believed that she has a superficial hematoma that should be cleaned out along with breaking up some of the scar tissue with a manipulation. Various options are discussed with the patient. Risks, benefits and expectations were discussed with the patient. Patient understand the risks, benefits and expectations and wishes to proceed with surgery.   PCP: Emeterio [name], MD   Discharged Condition: good  Hospital Course:  Patient underwent the above stated procedure on 12/09/2011. Patient tolerated the procedure well and brought to the recovery room in good condition and  subsequently to the floor.  POD #1 BP: 110/74 ; Pulse: 100 ; Temp: 98.6 F (37 C) ; Resp: 16 Pt's foley was removed, as well as the hemovac drain removed. IV was changed to a saline lock. Patient reports pain as moderate, she is having more pain in the right hip versus the right knee. She has known arthritis of the right hip and believes that she will eventually need a right THA. No events throughout the night. Ready to be discharged home. Neurovascular intact, dorsiflexion/plantar flexion intact, incision: dressing C/D/I, no cellulitis present and compartment soft.   LABS  Basename  12/10/11 0405  HGB  9.9  HCT  31.5    Discharge Exam: General appearance: alert, cooperative and no distress Extremities: Homans sign is negative, no sign of DVT, no edema, redness or tenderness in the calves or thighs and no ulcers, gangrene or trophic changes  Disposition:  Home-Health Care Svc with follow up in 2 weeks   Follow-up Information    Follow up with [name], MD in 2 weeks.   Contact information:   [facility] [address] [place] [address] [phone]          Discharge Orders    Future Orders Please Complete By Expires   Diet - low sodium heart healthy      Call MD / Call 911      Comments:   If you experience chest pain or shortness of breath, CALL 911 and be transported to the hospital emergency room.  If you develope a fever above 101 F, pus (white drainage) or  increased drainage or redness at the wound, or calf pain, call your surgeon's office.   Discharge instructions      Comments:   Maintain surgical dressing for 8 days, then replace with gauze and tape. Keep the area dry and clean until follow up. Follow up in 2 weeks at [facility]. Call with any questions or concerns.   Constipation Prevention      Comments:   Drink plenty of fluids.  Prune juice may be helpful.  You may use a stool softener, such as  Colace (over the counter) 100 mg twice a day.  Use MiraLax (over the counter) for constipation as needed.   Increase activity slowly as tolerated      Driving restrictions      Comments:   No driving for 4 weeks   TED hose      Comments:   Use stockings (TED hose) for 2 weeks on both leg(s).  You may remove them at night for sleeping.   Change dressing      Comments:   Maintain surgical dressing for 8 days, then change the dressing daily with sterile 4 x 4 inch gauze dressing and tape. Keep the area dry and clean.      Discharge Medication List as of 12/10/2011 11:11 AM    START taking these medications   Details  aspirin EC 325 MG EC tablet Take 1 tablet (325 mg total) by mouth 2 (two) times daily., Starting 12/10/2011, Until Fri 12/20/11, No Print    ferrous sulfate 325 (65 FE) MG tablet Take 1 tablet (325 mg total) by mouth 3 (three) times daily after meals., Starting 12/10/2011, Until Wed 12/09/12, No Print    oxyCODONE (OXY IR/ROXICODONE) 5 MG immediate release tablet Take 1-3 tablets (5-15 mg total) by mouth every 4 (four) hours as needed for pain., Starting 12/10/2011, Until Fri 12/20/11, Print    polyethylene glycol (MIRALAX / GLYCOLAX) packet Take 17 g by mouth 2 (two) times daily., Starting 12/10/2011, Until Fri 12/13/11, No Print      CONTINUE these medications which have CHANGED   Details  docusate sodium 100 MG CAPS Take 100 mg by mouth 2 (two) times daily., Starting 12/10/2011, Until Fri 12/20/11, No Print    methocarbamol (ROBAXIN) 500 MG tablet Take 1 tablet (500 mg total) by mouth every 6 (six) hours as needed (muscle spasms)., Starting 12/10/2011, Until Fri 12/20/11, Print      CONTINUE these medications which have NOT CHANGED   Details  acetaminophen (TYLENOL) 500 MG tablet Take 1,000 mg by mouth every 6 (six) hours as needed. FOR HIP PAIN, Until Discontinued, Historical Med    atorvastatin (LIPITOR) 40 MG tablet Take 20 mg by mouth every evening., Until Discontinued,  Historical Med    buPROPion (WELLBUTRIN XL) 300 MG 24 hr tablet Take 300 mg by mouth every evening., Until Discontinued, Historical Med    diphenhydrAMINE (BENADRYL) 25 mg capsule Take 25 mg by mouth every 6 (six) hours as needed. For itching, Until Discontinued, Historical Med    Multiple Vitamin (MULTIVITAMIN WITH MINERALS) TABS Take 1 tablet by mouth daily., Until Discontinued, Historical Med    omeprazole (PRILOSEC) 40 MG capsule Take 40 mg by mouth every evening., Until Discontinued, Historical Med    SUMAtriptan (IMITREX) 100 MG tablet Take 100 mg by mouth every 2 (two) hours as needed. For migraine, Until Discontinued, Historical Med    topiramate (TOPAMAX) 50 MG tablet Take 100 mg by mouth every evening., Until Discontinued, Historical  Med    LORazepam (ATIVAN) 0.5 MG tablet Take 0.5 mg by mouth every 8 (eight) hours as needed. For anxiety/sleep, Until Discontinued, Historical Med      STOP taking these medications     oxycodone (OXY-IR) 5 MG capsule Comments:  Reason for Stopping:       amoxicillin (AMOXIL) 500 MG capsule Comments:  Reason for Stopping:           Signed: Anastasio Auerbach. Nahima Ales   PAC  12/11/2011, 2:04 PM

## 2011-12-13 ENCOUNTER — Ambulatory Visit
Admission: RE | Admit: 2011-12-13 | Discharge: 2011-12-13 | Disposition: A | Discharge status: Home / Self Care | Payer: BC Managed Care – PPO | Source: Ambulatory Visit | Attending: Orthopedic Surgery | Admitting: Orthopedic Surgery

## 2011-12-13 ENCOUNTER — Other Ambulatory Visit: Payer: Self-pay | Admitting: Orthopedic Surgery

## 2011-12-13 DIAGNOSIS — M25559 Pain in unspecified hip: Secondary | ICD-10-CM

## 2011-12-13 MED ORDER — IOHEXOL 180 MG/ML  SOLN: 1.0000 mL | Freq: Once | INTRAMUSCULAR | Status: AC | PRN

## 2011-12-13 MED ORDER — METHYLPREDNISOLONE ACETATE 40 MG/ML INJ SUSP (RADIOLOG: 120.0000 mg | Freq: Once | INTRAMUSCULAR | Status: DC

## 2012-02-12 ENCOUNTER — Other Ambulatory Visit: Payer: Self-pay | Admitting: Orthopedic Surgery

## 2012-02-12 DIAGNOSIS — M169 Osteoarthritis of hip, unspecified: Secondary | ICD-10-CM

## 2012-02-12 DIAGNOSIS — M25551 Pain in right hip: Secondary | ICD-10-CM

## 2012-02-13 ENCOUNTER — Ambulatory Visit
Admission: RE | Admit: 2012-02-13 | Discharge: 2012-02-13 | Disposition: A | Discharge status: Home / Self Care | Payer: BC Managed Care – PPO | Source: Ambulatory Visit | Attending: Orthopedic Surgery | Admitting: Orthopedic Surgery

## 2012-02-13 DIAGNOSIS — M169 Osteoarthritis of hip, unspecified: Secondary | ICD-10-CM

## 2012-02-13 DIAGNOSIS — M25551 Pain in right hip: Secondary | ICD-10-CM

## 2012-02-13 MED ORDER — METHYLPREDNISOLONE ACETATE 40 MG/ML INJ SUSP (RADIOLOG
120.0000 mg | Freq: Once | INTRAMUSCULAR | Status: AC
  Administered 2012-02-13: 120 mg via INTRA_ARTICULAR

## 2012-02-13 MED ORDER — IOHEXOL 180 MG/ML  SOLN
1.0000 mL | Freq: Once | INTRAMUSCULAR | Status: AC | PRN
  Administered 2012-02-13: 1 mL via INTRA_ARTICULAR

## 2012-02-26 ENCOUNTER — Encounter (HOSPITAL_COMMUNITY): Payer: Self-pay | Admitting: *Deleted

## 2012-02-26 ENCOUNTER — Encounter (HOSPITAL_COMMUNITY): Payer: Self-pay | Admitting: Pharmacy Technician

## 2012-02-26 NOTE — H&P (Signed)
TOTAL HIP ADMISSION H&P  Patient is admitted for right total hip arthroplasty, anterior approach.  Subjective:  Chief Complaint:   Right hip OA / pain  HPI: Melissa Leach, 61 y.o. female, has a history of pain and functional disability in the right hip(s) due to arthritis and patient has failed non-surgical conservative treatments for greater than 12 weeks to include NSAID's and/or analgesics, corticosteriod injections and activity modification.  Onset of symptoms was gradual starting 2 years ago with gradually worsening course since that time.The patient noted no past surgery on the right hip(s).  Patient currently rates pain in the right hip at 8 out of 10 with activity. Patient has worsening of pain with activity and weight bearing, pain that interfers with activities of daily living and pain with passive range of motion. Patient has evidence of periarticular osteophytes and joint space narrowing by imaging studies. This condition presents safety issues increasing the risk of falls.  There is no current active infection. Risks, benefits and expectations were discussed with the patient. Patient understand the risks, benefits and expectations and wishes to proceed with surgery.   D/C Plans:  Home with HHPT/SNF ?   Post-op Meds:  No Rx given   Tranexamic Acid:  To be given  Decadron:   To be given   Patient Active Problem List   Diagnosis Date Noted  . Obese 12/10/2011  . Expected blood loss anemia 12/10/2011  . Hematoma - postop right knee 12/09/2011  . S/P right knee revision 10/28/2011   Past Medical History  Diagnosis Date  . Gastritis     HISTORY OF GASTRITIS--OCCAS ABDOMINAL PAIN   . H/O hiatal hernia   . Headache     MIGRAINES  . Arthritis     PAIN IN BOTH HIPS AND KNEES  . Anxiety   . Depression   . Complication of anesthesia     "SCRATCHED CORNEA"  WAKING UP FROM  KNEE SURGERY--'82    Past Surgical History  Procedure Date  . Joint replacement     RIGHT TOTAL KNEE  REPLACEMENT AND RT TOTAL KNEE REVISION    AND  LEFT TOTAL KNEE REPLACEMENT  . Tubal ligation   . Tonsillectomy   . Total hip arthroplasty 06/25/2011    Procedure: TOTAL HIP ARTHROPLASTY ANTERIOR APPROACH;  Surgeon: [name], MD;  Location: [facility];  Service: Orthopedics;  Laterality: Left;  Marland Kitchen Eye surgery 1994    RA surgery-  . Total knee revision 10/28/2011    Procedure: TOTAL KNEE REVISION;  Surgeon: [name], MD;  Location: [facility];  Service: Orthopedics;  Laterality: Right;  . Hematoma evacuation 12/09/2011    Procedure: EVACUATION HEMATOMA;  Surgeon: [name], MD;  Location: [facility];  Service: Orthopedics;  Laterality: Right;  Evacuation of Hematoma Right Knee, I & D and Closed Manipulation of Right Knee  . Irrigation and debridement knee 12/09/2011    Procedure: IRRIGATION AND DEBRIDEMENT KNEE;  Surgeon: [name], MD;  Location: [facility];  Service: Orthopedics;  Laterality: Right;  . Knee closed reduction 12/09/2011    Procedure: CLOSED MANIPULATION KNEE;  Surgeon: [name], MD;  Location: [facility];  Service: Orthopedics;  Laterality: Right;    No prescriptions prior to admission   Allergies  Allergen Reactions  . Sulfa Antibiotics Rash    History  Substance Use Topics  . Smoking status: Former Games developer  . Smokeless tobacco: Former Neurosurgeon    Quit date: 10/21/1976  Comment: QUIT SMOKING OVER 25 YRS AGO  . Alcohol Use: No       Review of Systems  Constitutional: Negative.   HENT: Negative.   Eyes: Negative.   Respiratory: Negative.   Cardiovascular: Negative.   Gastrointestinal: Negative.   Genitourinary: Negative.   Musculoskeletal: Positive for joint pain.  Skin: Negative.   Neurological: Negative.   Endo/Heme/Allergies: Negative.   Psychiatric/Behavioral: The patient is nervous/anxious.     Objective:  Physical Exam  Constitutional: She is oriented to person, place, and time. She appears well-developed and well-nourished.  HENT:  Head:  Normocephalic and atraumatic.  Mouth/Throat: Oropharynx is clear and moist.  Eyes: Pupils are equal, round, and reactive to light.  Neck: Neck supple. No JVD present. No tracheal deviation present. No thyromegaly present.  Cardiovascular: Normal rate, regular rhythm, normal heart sounds and intact distal pulses.   Respiratory: Effort normal and breath sounds normal. No stridor. No respiratory distress. She has no wheezes.  GI: Soft. There is no tenderness. There is no guarding.  Musculoskeletal:       Right hip: She exhibits decreased range of motion, decreased strength, tenderness and bony tenderness. She exhibits no swelling, no deformity and no laceration.  Lymphadenopathy:    She has no cervical adenopathy.  Neurological: She is alert and oriented to person, place, and time.  Skin: Skin is warm and dry.  Psychiatric: She has a normal mood and affect.    Labs:  Estimated Body mass index is 35.33 kg/(m^2) as calculated from the following:   Height as of 12/05/11: 5\' 1" (1.549 m).   Weight as of 12/05/11: 187 lb(84.823 kg).   Imaging Review Plain radiographs demonstrate severe degenerative joint disease of the right hip(s). The bone quality appears to be good for age and reported activity level.  Assessment/Plan:  End stage arthritis, right hip(s)  The patient history, physical examination, clinical judgement of the provider and imaging studies are consistent with end stage degenerative joint disease of the right hip(s) and total hip arthroplasty is deemed medically necessary. The treatment options including medical management, injection therapy, arthroscopy and arthroplasty were discussed at length. The risks and benefits of total hip arthroplasty were presented and reviewed. The risks due to aseptic loosening, infection, stiffness, dislocation/subluxation,  thromboembolic complications and other imponderables were discussed.  The patient acknowledged the explanation, agreed to proceed  with the plan and consent was signed. Patient is being admitted for inpatient treatment for surgery, pain control, PT, OT, prophylactic antibiotics, VTE prophylaxis, progressive ambulation and ADL's and discharge planning.The patient is planning to be discharged home with home health services vs skilled nursing facility.    Anastasio Auerbach Maxum Cassarino   PAC  02/26/2012, 2:41 PM

## 2012-02-26 NOTE — Progress Notes (Signed)
PT ADDED ON FOR SURGERY MON 12/2 --SAMEDAY SURGERY INSTRUCTIONS GIVEN TO PT-INCLUDING BETASEPT SOAP SHOWER INSTRUCTIONS / PRECAUTIONS.

## 2012-03-02 ENCOUNTER — Ambulatory Visit (HOSPITAL_COMMUNITY): Payer: BC Managed Care – PPO

## 2012-03-02 ENCOUNTER — Encounter (HOSPITAL_COMMUNITY): Payer: Self-pay | Admitting: Anesthesiology

## 2012-03-02 ENCOUNTER — Inpatient Hospital Stay (HOSPITAL_COMMUNITY)
Admission: RE | Admit: 2012-03-02 | Discharge: 2012-03-04 | DRG: 818 | Disposition: A | Discharge status: Home Health | Payer: BC Managed Care – PPO | Source: Ambulatory Visit | Attending: Orthopedic Surgery | Admitting: Orthopedic Surgery

## 2012-03-02 ENCOUNTER — Encounter (HOSPITAL_COMMUNITY): Payer: Self-pay | Admitting: *Deleted

## 2012-03-02 ENCOUNTER — Encounter (HOSPITAL_COMMUNITY)
Admission: RE | Disposition: A | Discharge status: Home Health | Payer: Self-pay | Source: Ambulatory Visit | Attending: Orthopedic Surgery

## 2012-03-02 ENCOUNTER — Ambulatory Visit (HOSPITAL_COMMUNITY): Payer: BC Managed Care – PPO | Admitting: Anesthesiology

## 2012-03-02 DIAGNOSIS — M169 Osteoarthritis of hip, unspecified: Principal | ICD-10-CM | POA: Diagnosis present

## 2012-03-02 DIAGNOSIS — Z96649 Presence of unspecified artificial hip joint: Secondary | ICD-10-CM

## 2012-03-02 DIAGNOSIS — Z96659 Presence of unspecified artificial knee joint: Secondary | ICD-10-CM

## 2012-03-02 DIAGNOSIS — F329 Major depressive disorder, single episode, unspecified: Secondary | ICD-10-CM | POA: Diagnosis present

## 2012-03-02 DIAGNOSIS — E669 Obesity, unspecified: Secondary | ICD-10-CM | POA: Diagnosis present

## 2012-03-02 DIAGNOSIS — Z6834 Body mass index (BMI) 34.0-34.9, adult: Secondary | ICD-10-CM

## 2012-03-02 DIAGNOSIS — F411 Generalized anxiety disorder: Secondary | ICD-10-CM | POA: Diagnosis present

## 2012-03-02 DIAGNOSIS — D500 Iron deficiency anemia secondary to blood loss (chronic): Secondary | ICD-10-CM

## 2012-03-02 DIAGNOSIS — F3289 Other specified depressive episodes: Secondary | ICD-10-CM | POA: Diagnosis present

## 2012-03-02 DIAGNOSIS — Z96641 Presence of right artificial hip joint: Secondary | ICD-10-CM | POA: Insufficient documentation

## 2012-03-02 DIAGNOSIS — D62 Acute posthemorrhagic anemia: Secondary | ICD-10-CM | POA: Diagnosis not present

## 2012-03-02 DIAGNOSIS — M1610 Unilateral primary osteoarthritis, unspecified hip: Principal | ICD-10-CM | POA: Diagnosis present

## 2012-03-02 HISTORY — DX: Hematuria, unspecified: R31.9

## 2012-03-02 HISTORY — PX: TOTAL HIP ARTHROPLASTY: SHX124

## 2012-03-02 LAB — URINALYSIS, ROUTINE W REFLEX MICROSCOPIC
Glucose, UA: NEGATIVE mg/dL
Leukocytes, UA: NEGATIVE
Protein, ur: NEGATIVE mg/dL
pH: 5 (ref 5.0–8.0)

## 2012-03-02 LAB — BASIC METABOLIC PANEL
BUN: 21 mg/dL (ref 6–23)
Chloride: 104 mEq/L (ref 96–112)
GFR calc Af Amer: >90 mL/min (ref >90)
GFR calc non Af Amer: 89 mL/min — ABNORMAL LOW (ref >90)
Glucose, Bld: 94 mg/dL (ref 70–99)
Potassium: 3.6 mEq/L (ref 3.5–5.1)
Sodium: 139 mEq/L (ref 135–145)

## 2012-03-02 LAB — SURGICAL PCR SCREEN: MRSA, PCR: NEGATIVE

## 2012-03-02 LAB — CBC
HCT: 35.4 % — ABNORMAL LOW (ref 36.0–46.0)
Hemoglobin: 11.7 g/dL — ABNORMAL LOW (ref 12.0–15.0)
MCHC: 33.1 g/dL (ref 30.0–36.0)
WBC: 7.7 10*3/uL (ref 4.0–10.5)

## 2012-03-02 LAB — TYPE AND SCREEN: ABO/RH(D): O NEG

## 2012-03-02 SURGERY — ARTHROPLASTY, HIP, TOTAL, ANTERIOR APPROACH
Anesthesia: Spinal | Site: Hip | Laterality: Right | Wound class: Clean

## 2012-03-02 MED ORDER — MUPIROCIN 2 % EX OINT
TOPICAL_OINTMENT | CUTANEOUS | Status: AC
  Administered 2012-03-02: 1
  Filled 2012-03-02: qty 22

## 2012-03-02 MED ORDER — 0.9 % SODIUM CHLORIDE (POUR BTL) OPTIME
TOPICAL | Status: DC | PRN
  Administered 2012-03-02: 1000 mL

## 2012-03-02 MED ORDER — LORAZEPAM 0.5 MG PO TABS: 0.5000 mg | ORAL_TABLET | Freq: Three times a day (TID) | ORAL | Status: DC | PRN

## 2012-03-02 MED ORDER — BUPIVACAINE HCL (PF) 0.5 % IJ SOLN
INTRAMUSCULAR | Status: DC | PRN
  Administered 2012-03-02: 15 mg

## 2012-03-02 MED ORDER — PANTOPRAZOLE SODIUM 40 MG PO TBEC
80.0000 mg | DELAYED_RELEASE_TABLET | Freq: Every day | ORAL | Status: DC
  Administered 2012-03-02 – 2012-03-04 (×3): 80 mg via ORAL
  Filled 2012-03-02 (×3): qty 2

## 2012-03-02 MED ORDER — RIVAROXABAN 10 MG PO TABS
10.0000 mg | ORAL_TABLET | ORAL | Status: DC
  Filled 2012-03-02: qty 1

## 2012-03-02 MED ORDER — BUPROPION HCL ER (XL) 300 MG PO TB24
300.0000 mg | ORAL_TABLET | Freq: Every day | ORAL | Status: DC
Start: 2012-03-02 — End: 2012-03-04
  Administered 2012-03-02 – 2012-03-03 (×2): 300 mg via ORAL
  Filled 2012-03-02 (×3): qty 1

## 2012-03-02 MED ORDER — BUPIVACAINE HCL (PF) 0.5 % IJ SOLN
INTRAMUSCULAR | Status: AC
  Filled 2012-03-02: qty 30

## 2012-03-02 MED ORDER — METOCLOPRAMIDE HCL 10 MG PO TABS: 5.0000 mg | ORAL_TABLET | Freq: Three times a day (TID) | ORAL | Status: DC | PRN

## 2012-03-02 MED ORDER — MIDAZOLAM HCL 5 MG/5ML IJ SOLN
INTRAMUSCULAR | Status: DC | PRN
  Administered 2012-03-02 (×2): 2 mg via INTRAVENOUS

## 2012-03-02 MED ORDER — CEFAZOLIN SODIUM-DEXTROSE 2-3 GM-% IV SOLR
INTRAVENOUS | Status: AC
  Filled 2012-03-02: qty 50

## 2012-03-02 MED ORDER — ALPRAZOLAM 0.25 MG PO TABS: 0.2500 mg | ORAL_TABLET | Freq: Every evening | ORAL | Status: DC | PRN

## 2012-03-02 MED ORDER — ACETAMINOPHEN 10 MG/ML IV SOLN
INTRAVENOUS | Status: AC
  Filled 2012-03-02: qty 100

## 2012-03-02 MED ORDER — DIPHENHYDRAMINE HCL 25 MG PO CAPS: 25.0000 mg | ORAL_CAPSULE | Freq: Four times a day (QID) | ORAL | Status: DC | PRN

## 2012-03-02 MED ORDER — TOPIRAMATE 100 MG PO TABS
100.0000 mg | ORAL_TABLET | Freq: Every day | ORAL | Status: DC
  Administered 2012-03-03: 100 mg via ORAL
  Filled 2012-03-02 (×2): qty 1

## 2012-03-02 MED ORDER — DEXAMETHASONE SODIUM PHOSPHATE 10 MG/ML IJ SOLN
10.0000 mg | Freq: Once | INTRAMUSCULAR | Status: AC
  Administered 2012-03-03: 10 mg via INTRAVENOUS
  Filled 2012-03-02: qty 1

## 2012-03-02 MED ORDER — BISACODYL 10 MG RE SUPP: 10.0000 mg | Freq: Every day | RECTAL | Status: DC | PRN

## 2012-03-02 MED ORDER — PROPOFOL INFUSION 10 MG/ML OPTIME
INTRAVENOUS | Status: DC | PRN
  Administered 2012-03-02: 300 ug/kg/min via INTRAVENOUS

## 2012-03-02 MED ORDER — ONDANSETRON HCL 4 MG PO TABS: 4.0000 mg | ORAL_TABLET | Freq: Four times a day (QID) | ORAL | Status: DC | PRN

## 2012-03-02 MED ORDER — ACETAMINOPHEN 10 MG/ML IV SOLN
INTRAVENOUS | Status: DC | PRN
  Administered 2012-03-02: 1000 mg via INTRAVENOUS

## 2012-03-02 MED ORDER — DEXAMETHASONE SODIUM PHOSPHATE 10 MG/ML IJ SOLN: 10.0000 mg | Freq: Once | INTRAMUSCULAR | Status: DC

## 2012-03-02 MED ORDER — CHLORHEXIDINE GLUCONATE 4 % EX LIQD
60.0000 mL | Freq: Once | CUTANEOUS | Status: DC
  Filled 2012-03-02: qty 60

## 2012-03-02 MED ORDER — FENTANYL CITRATE 0.05 MG/ML IJ SOLN
INTRAMUSCULAR | Status: DC | PRN
  Administered 2012-03-02 (×2): 50 ug via INTRAVENOUS

## 2012-03-02 MED ORDER — MUPIROCIN 2 % EX OINT: TOPICAL_OINTMENT | Freq: Two times a day (BID) | CUTANEOUS | Status: DC

## 2012-03-02 MED ORDER — HYDROMORPHONE HCL PF 1 MG/ML IJ SOLN
0.2500 mg | INTRAMUSCULAR | Status: DC | PRN
  Administered 2012-03-02 (×4): 0.5 mg via INTRAVENOUS

## 2012-03-02 MED ORDER — HYDROMORPHONE HCL PF 1 MG/ML IJ SOLN
INTRAMUSCULAR | Status: AC
  Filled 2012-03-02: qty 1

## 2012-03-02 MED ORDER — SUMATRIPTAN SUCCINATE 100 MG PO TABS
100.0000 mg | ORAL_TABLET | ORAL | Status: DC | PRN
  Filled 2012-03-02: qty 1

## 2012-03-02 MED ORDER — LACTATED RINGERS IV SOLN
INTRAVENOUS | Status: DC
  Administered 2012-03-02: 1000 mL via INTRAVENOUS

## 2012-03-02 MED ORDER — LACTATED RINGERS IV SOLN: INTRAVENOUS | Status: DC

## 2012-03-02 MED ORDER — TRANEXAMIC ACID 100 MG/ML IV SOLN
1240.0000 mg | Freq: Once | INTRAVENOUS | Status: AC
  Administered 2012-03-02: 1239 mg via INTRAVENOUS
  Filled 2012-03-02: qty 12.4

## 2012-03-02 MED ORDER — DOCUSATE SODIUM 100 MG PO CAPS
100.0000 mg | ORAL_CAPSULE | Freq: Two times a day (BID) | ORAL | Status: DC
  Administered 2012-03-02 – 2012-03-04 (×4): 100 mg via ORAL

## 2012-03-02 MED ORDER — OXYCODONE HCL 5 MG PO TABS
5.0000 mg | ORAL_TABLET | ORAL | Status: DC
  Administered 2012-03-02 – 2012-03-03 (×4): 15 mg via ORAL
  Administered 2012-03-03: 10 mg via ORAL
  Administered 2012-03-03 – 2012-03-04 (×5): 15 mg via ORAL
  Filled 2012-03-02 (×6): qty 3
  Filled 2012-03-02: qty 2
  Filled 2012-03-02 (×3): qty 3

## 2012-03-02 MED ORDER — PREGABALIN 75 MG PO CAPS
75.0000 mg | ORAL_CAPSULE | Freq: Two times a day (BID) | ORAL | Status: DC
  Administered 2012-03-02 – 2012-03-04 (×4): 75 mg via ORAL
  Filled 2012-03-02 (×4): qty 1

## 2012-03-02 MED ORDER — ATORVASTATIN CALCIUM 20 MG PO TABS
20.0000 mg | ORAL_TABLET | Freq: Every day | ORAL | Status: DC
  Administered 2012-03-02 – 2012-03-03 (×2): 20 mg via ORAL
  Filled 2012-03-02 (×3): qty 1

## 2012-03-02 MED ORDER — CEFAZOLIN SODIUM-DEXTROSE 2-3 GM-% IV SOLR
2.0000 g | Freq: Four times a day (QID) | INTRAVENOUS | Status: AC
  Administered 2012-03-02 – 2012-03-03 (×2): 2 g via INTRAVENOUS
  Filled 2012-03-02 (×2): qty 50

## 2012-03-02 MED ORDER — ZOLPIDEM TARTRATE 5 MG PO TABS: 5.0000 mg | ORAL_TABLET | Freq: Every evening | ORAL | Status: DC | PRN

## 2012-03-02 MED ORDER — PROPOFOL 10 MG/ML IV BOLUS
INTRAVENOUS | Status: DC | PRN
  Administered 2012-03-02: 30 mg via INTRAVENOUS
  Administered 2012-03-02: 20 mg via INTRAVENOUS

## 2012-03-02 MED ORDER — PHENOL 1.4 % MT LIQD: 1.0000 | OROMUCOSAL | Status: DC | PRN

## 2012-03-02 MED ORDER — RIVAROXABAN 10 MG PO TABS
10.0000 mg | ORAL_TABLET | Freq: Every day | ORAL | Status: DC
  Administered 2012-03-03 – 2012-03-04 (×2): 10 mg via ORAL
  Filled 2012-03-02 (×4): qty 1

## 2012-03-02 MED ORDER — METOCLOPRAMIDE HCL 5 MG/ML IJ SOLN: 5.0000 mg | Freq: Three times a day (TID) | INTRAMUSCULAR | Status: DC | PRN

## 2012-03-02 MED ORDER — LACTATED RINGERS IV SOLN
INTRAVENOUS | Status: DC | PRN
  Administered 2012-03-02 (×3): via INTRAVENOUS

## 2012-03-02 MED ORDER — HYDROMORPHONE HCL PF 1 MG/ML IJ SOLN
0.5000 mg | INTRAMUSCULAR | Status: DC | PRN
  Administered 2012-03-02: 2 mg via INTRAVENOUS
  Administered 2012-03-03 (×3): 1 mg via INTRAVENOUS
  Filled 2012-03-02: qty 2
  Filled 2012-03-02 (×4): qty 1

## 2012-03-02 MED ORDER — METHOCARBAMOL 500 MG PO TABS
500.0000 mg | ORAL_TABLET | Freq: Four times a day (QID) | ORAL | Status: DC | PRN
  Administered 2012-03-03 – 2012-03-04 (×3): 500 mg via ORAL
  Filled 2012-03-02 (×3): qty 1

## 2012-03-02 MED ORDER — FLEET ENEMA 7-19 GM/118ML RE ENEM: 1.0000 | ENEMA | Freq: Once | RECTAL | Status: AC | PRN

## 2012-03-02 MED ORDER — METHOCARBAMOL 100 MG/ML IJ SOLN
500.0000 mg | Freq: Four times a day (QID) | INTRAVENOUS | Status: DC | PRN
  Administered 2012-03-02: 500 mg via INTRAVENOUS
  Filled 2012-03-02: qty 5

## 2012-03-02 MED ORDER — ALUM & MAG HYDROXIDE-SIMETH 200-200-20 MG/5ML PO SUSP: 30.0000 mL | ORAL | Status: DC | PRN

## 2012-03-02 MED ORDER — SODIUM CHLORIDE 0.9 % IV SOLN
100.0000 mL/h | INTRAVENOUS | Status: DC
  Administered 2012-03-02 – 2012-03-03 (×2): 100 mL/h via INTRAVENOUS
  Filled 2012-03-02 (×12): qty 1000

## 2012-03-02 MED ORDER — MENTHOL 3 MG MT LOZG
1.0000 | LOZENGE | OROMUCOSAL | Status: DC | PRN
  Filled 2012-03-02: qty 9

## 2012-03-02 MED ORDER — FERROUS SULFATE 325 (65 FE) MG PO TABS
325.0000 mg | ORAL_TABLET | Freq: Three times a day (TID) | ORAL | Status: DC
  Administered 2012-03-02 – 2012-03-04 (×5): 325 mg via ORAL
  Filled 2012-03-02 (×8): qty 1

## 2012-03-02 MED ORDER — POLYETHYLENE GLYCOL 3350 17 G PO PACK
17.0000 g | PACK | Freq: Two times a day (BID) | ORAL | Status: DC
  Administered 2012-03-02 – 2012-03-04 (×4): 17 g via ORAL

## 2012-03-02 MED ORDER — ONDANSETRON HCL 4 MG/2ML IJ SOLN: 4.0000 mg | Freq: Four times a day (QID) | INTRAMUSCULAR | Status: DC | PRN

## 2012-03-02 MED ORDER — CEFAZOLIN SODIUM-DEXTROSE 2-3 GM-% IV SOLR
2.0000 g | INTRAVENOUS | Status: AC
  Administered 2012-03-02: 2 g via INTRAVENOUS

## 2012-03-02 MED ORDER — EPHEDRINE SULFATE 50 MG/ML IJ SOLN
INTRAMUSCULAR | Status: DC | PRN
  Administered 2012-03-02 (×3): 5 mg via INTRAVENOUS

## 2012-03-02 SURGICAL SUPPLY — 39 items
ADH SKN CLS APL DERMABOND .7 (GAUZE/BANDAGES/DRESSINGS) ×1
BAG SPEC THK2 15X12 ZIP CLS (MISCELLANEOUS) ×1
BAG ZIPLOCK 12X15 (MISCELLANEOUS) ×3 IMPLANT
BLADE SAW SGTL 18X1.27X75 (BLADE) ×2 IMPLANT
CLOTH BEACON ORANGE TIMEOUT ST (SAFETY) ×2 IMPLANT
DERMABOND ADVANCED (GAUZE/BANDAGES/DRESSINGS) ×1
DERMABOND ADVANCED .7 DNX12 (GAUZE/BANDAGES/DRESSINGS) ×1 IMPLANT
DRAPE C-ARM 42X72 X-RAY (DRAPES) ×2 IMPLANT
DRAPE STERI IOBAN 125X83 (DRAPES) ×2 IMPLANT
DRAPE U-SHAPE 47X51 STRL (DRAPES) ×6 IMPLANT
DRSG AQUACEL AG ADV 3.5X10 (GAUZE/BANDAGES/DRESSINGS) ×2 IMPLANT
DRSG TEGADERM 4X4.75 (GAUZE/BANDAGES/DRESSINGS) ×1 IMPLANT
DURAPREP 26ML APPLICATOR (WOUND CARE) ×2 IMPLANT
ELECT BLADE TIP CTD 4 INCH (ELECTRODE) ×2 IMPLANT
ELECT REM PT RETURN 9FT ADLT (ELECTROSURGICAL) ×2
ELECTRODE REM PT RTRN 9FT ADLT (ELECTROSURGICAL) ×1 IMPLANT
EVACUATOR 1/8 PVC DRAIN (DRAIN) ×1 IMPLANT
FACESHIELD LNG OPTICON STERILE (SAFETY) ×8 IMPLANT
GAUZE SPONGE 2X2 8PLY STRL LF (GAUZE/BANDAGES/DRESSINGS) ×1 IMPLANT
GLOVE BIOGEL PI IND STRL 7.5 (GLOVE) ×1 IMPLANT
GLOVE BIOGEL PI IND STRL 8 (GLOVE) ×1 IMPLANT
GLOVE BIOGEL PI INDICATOR 7.5 (GLOVE) ×1
GLOVE BIOGEL PI INDICATOR 8 (GLOVE) ×1
GLOVE ECLIPSE 8.0 STRL XLNG CF (GLOVE) ×2 IMPLANT
GLOVE ORTHO TXT STRL SZ7.5 (GLOVE) ×4 IMPLANT
GOWN BRE IMP PREV XXLGXLNG (GOWN DISPOSABLE) ×4 IMPLANT
GOWN STRL NON-REIN LRG LVL3 (GOWN DISPOSABLE) ×3 IMPLANT
KIT BASIN OR (CUSTOM PROCEDURE TRAY) ×2 IMPLANT
PACK TOTAL JOINT (CUSTOM PROCEDURE TRAY) ×2 IMPLANT
PADDING CAST COTTON 6X4 STRL (CAST SUPPLIES) ×2 IMPLANT
SPONGE GAUZE 2X2 STER 10/PKG (GAUZE/BANDAGES/DRESSINGS) ×1
SUCTION FRAZIER 12FR DISP (SUCTIONS) ×2 IMPLANT
SUT MNCRL AB 4-0 PS2 18 (SUTURE) ×2 IMPLANT
SUT VIC AB 1 CT1 36 (SUTURE) ×7 IMPLANT
SUT VIC AB 2-0 CT1 27 (SUTURE) ×4
SUT VIC AB 2-0 CT1 TAPERPNT 27 (SUTURE) ×2 IMPLANT
SUT VLOC 180 0 24IN GS25 (SUTURE) ×2 IMPLANT
TOWEL OR 17X26 10 PK STRL BLUE (TOWEL DISPOSABLE) ×4 IMPLANT
TRAY FOLEY CATH 14FRSI W/METER (CATHETERS) ×2 IMPLANT

## 2012-03-02 NOTE — Interval H&P Note (Signed)
History and Physical Interval Note:  03/02/2012 1:15 PM  Melissa Leach  has presented today for surgery, with the diagnosis of osteoarthritis of the right hip  The various methods of treatment have been discussed with the patient and family. After consideration of risks, benefits and other options for treatment, the patient has consented to  Procedure(s) (LRB) with comments: TOTAL HIP ARTHROPLASTY ANTERIOR APPROACH (Right) as a surgical intervention .  The patient's history has been reviewed, patient examined, no change in status, stable for surgery.  I have reviewed the patient's chart and labs.  Questions were answered to the patient's satisfaction.     [name]

## 2012-03-02 NOTE — Anesthesia Preprocedure Evaluation (Signed)
Anesthesia Evaluation  Patient identified by MRN, date of birth, ID band Patient awake    Reviewed: Allergy & Precautions, H&P , NPO status , Patient's Chart, lab work & pertinent test results  Airway Mallampati: II TM Distance: >3 FB Neck ROM: full    Dental No notable dental hx. (+) Dental Advisory Given and Teeth Intact,    Pulmonary neg pulmonary ROS,  breath sounds clear to auscultation  Pulmonary exam normal       Cardiovascular Exercise Tolerance: Good negative cardio ROS  Rhythm:regular Rate:Normal     Neuro/Psych  Headaches, negative neurological ROS  negative psych ROS   GI/Hepatic negative GI ROS, Neg liver ROS, hiatal hernia,   Endo/Other  negative endocrine ROS  Renal/GU negative Renal ROS  negative genitourinary   Musculoskeletal   Abdominal   Peds  Hematology negative hematology ROS (+)   Anesthesia Other Findings   Reproductive/Obstetrics negative OB ROS                           Anesthesia Physical Anesthesia Plan  ASA: II  Anesthesia Plan: Spinal   Post-op Pain Management:    Induction:   Airway Management Planned:   Additional Equipment:   Intra-op Plan:   Post-operative Plan:   Informed Consent: I have reviewed the patients History and Physical, chart, labs and discussed the procedure including the risks, benefits and alternatives for the proposed anesthesia with the patient or authorized representative who has indicated his/her understanding and acceptance.   Dental Advisory Given  Plan Discussed with: CRNA and Surgeon  Anesthesia Plan Comments:         Anesthesia Quick Evaluation

## 2012-03-02 NOTE — Op Note (Signed)
NAME:  Melissa Leach                ACCOUNT NO.: [id number]      MEDICAL RECORD NO.: [id number]      FACILITY:  [facility]      PHYSICIAN:  Durene Romans D  DATE OF BIRTH:  05/23/1950     DATE OF PROCEDURE:  03/02/2012                                 OPERATIVE REPORT         PREOPERATIVE DIAGNOSIS: Right  hip osteoarthritis.      POSTOPERATIVE DIAGNOSIS:  Right hip osteoarthritis.      PROCEDURE:  Right total hip replacement through an anterior approach   utilizing DePuy THR system, component size 50mm pinnacle cup, a size 32+4 neutral   Altrex liner, a size 0 Hi Tri Lock stem with a 32+1 delta ceramic   ball.      SURGEON:  Madlyn Frankel. Charlann Boxer, M.D.      ASSISTANT:  Lanney Gins, PA      ANESTHESIA:  Spinal.      SPECIMENS:  None.      COMPLICATIONS:  None.      BLOOD LOSS:  400 cc     DRAINS:  One Hemovac.      INDICATION OF THE PROCEDURE:  Pantera Michell is a 61 y.o. female who had   presented to office for evaluation of right hip pain.  Radiographs revealed   progressive degenerative changes with bone-on-bone   articulation to the  hip joint.  The patient had painful limited range of   motion significantly affecting their overall quality of life.  The patient was failing to    respond to conservative measures, and at this point was ready   to proceed with more definitive measures.  The patient has noted progressive   degenerative changes in his hip, progressive problems and dysfunction   with regarding the hip prior to surgery.  Consent was obtained for   benefit of pain relief.  Specific risk of infection, DVT, component   failure, dislocation, need for revision surgery, as well discussion of   the anterior versus posterior approach were reviewed.  Consent was   obtained for benefit of anterior pain relief through an anterior   approach.      PROCEDURE IN DETAIL:  The patient was brought to operative theater.   Once adequate anesthesia,  preoperative antibiotics, 2gm Ancef administered.   The patient was positioned supine on the OSI Hanna table.  Once adequate   padding of boney process was carried out, we had predraped out the hip, and  used fluoroscopy to confirm orientation of the pelvis and position.      The right hip was then prepped and draped from proximal iliac crest to   mid thigh with shower curtain technique.      Time-out was performed identifying the patient, planned procedure, and   extremity.     An incision was then made 2 cm distal and lateral to the   anterior superior iliac spine extending over the orientation of the   tensor fascia lata muscle and sharp dissection was carried down to the   fascia of the muscle and protractor placed in the soft tissues.      The fascia was then incised.  The muscle belly was identified and  swept   laterally and retractor placed along the superior neck.  Following   cauterization of the circumflex vessels and removing some pericapsular   fat, a second cobra retractor was placed on the inferior neck.  A third   retractor was placed on the anterior acetabulum after elevating the   anterior rectus.  A L-capsulotomy was along the line of the   superior neck to the trochanteric fossa, then extended proximally and   distally.  Tag sutures were placed and the retractors were then placed   intracapsular.  We then identified the trochanteric fossa and   orientation of my neck cut, confirmed this radiographically   and then made a neck osteotomy with the femur on traction.  The femoral   head was removed without difficulty or complication.  Traction was let   off and retractors were placed posterior and anterior around the   acetabulum.      The labrum and foveal tissue were debrided.  I began reaming with a 45mm   reamer and reamed up to 49mm reamer with good bony bed preparation and a 50   cup was chosen.  The final 50mm Pinnacle cup was then impacted under fluoroscopy  to  confirm the depth of penetration and orientation with respect to   abduction.  A screw was placed followed by the hole eliminator.  The final   32+4 neutral Altrex liner was impacted with good visualized rim fit.  The cup was positioned anatomically within the acetabular portion of the pelvis.      At this point, the femur was rolled at 80 degrees.  Further capsule was   released off the inferior aspect of the femoral neck.  I then   released the superior capsule proximally.  The hook was placed laterally   along the femur and elevated manually and held in position with the bed   hook.  The leg was then extended and adducted with the leg rolled to 100   degrees of external rotation.  Once the proximal femur was fully   exposed, I used a box osteotome to set orientation.  I then began   broaching with the starting chili pepper broach and passed this by hand and then broached up to only the zero broach, which matched the other side.  With the 0 broach in place I chose a high offset neck and did a trial reduction with the 32+1 ball.  The offset was appropriate, leg lengths   appeared to be equal, confirmed radiographically.   Given these findings, I went ahead and dislocated the hip, repositioned all   retractors and positioned the right hip in the extended and abducted position.  The final 0 Hi Tri Lock stem was   chosen and it was impacted down to the level of neck cut.  Based on this   and the trial reduction, a 32+1 delta ceramic ball was chosen and   impacted onto a clean and dry trunnion, and the hip was reduced.  This matched her contralateral hip.  Based on her anatomy and type A femur I passed the 0 component as low into the femur as it could go without causing femoral fracture.  The   hip had been irrigated throughout the case again at this point.  I did   reapproximate the superior capsular leaflet to the anterior leaflet   using #1 Vicryl, placed a medium Hemovac drain deep.  The fascia  of the   tensor fascia  lata muscle was then reapproximated using #1 Vicryl.  The   remaining wound was closed with 2-0 Vicryl and running 4-0 Monocryl.   The hip was cleaned, dried, and dressed sterilely using Dermabond and   Aquacel dressing.  Drain site dressed separately.  She was then brought   to recovery room in stable condition tolerating the procedure well.    Lanney Gins, PA-C was present for the entirety of the case involved from   preoperative positioning, perioperative retractor management, general   facilitation of the case, as well as primary wound closure as assistant.            Madlyn Frankel Charlann Boxer, M.D.            MDO/MEDQ  D:  01/22/2011  T:  01/22/2011  Job:  [id number]      Electronically Signed by Durene Romans M.D. on 01/28/2011 09:15:38 AM

## 2012-03-02 NOTE — Anesthesia Procedure Notes (Signed)
Spinal  Patient location during procedure: OR Start time: 03/02/2012 3:32 PM End time: 03/02/2012 3:45 PM Staffing Anesthesiologist: Ronelle Nigh L Performed by: anesthesiologist  Preanesthetic Checklist Completed: patient identified, site marked, surgical consent, pre-op evaluation, timeout performed, IV checked, risks and benefits discussed and monitors and equipment checked Spinal Block Patient position: sitting Prep: Betadine Patient monitoring: heart rate, continuous pulse ox and blood pressure Location: L2-3 Injection technique: single-shot Needle Needle type: Whitacre  Needle gauge: 25 G Needle length: 9 cm Assessment Sensory level: T4 Events: paresthesia Additional Notes Expiration date of kit checked and confirmed. Patient tolerated procedure well, without complications.

## 2012-03-02 NOTE — Anesthesia Postprocedure Evaluation (Signed)
  Anesthesia Post-op Note  Patient: Melissa Leach  Procedure(s) Performed: Procedure(s) (LRB): TOTAL HIP ARTHROPLASTY ANTERIOR APPROACH (Right)  Patient Location: PACU  Anesthesia Type: Spinal  Level of Consciousness: awake and alert   Airway and Oxygen Therapy: Patient Spontanous Breathing  Post-op Pain: mild  Post-op Assessment: Post-op Vital signs reviewed, Patient's Cardiovascular Status Stable, Respiratory Function Stable, Patent Airway and No signs of Nausea or vomiting  Last Vitals:  Filed Vitals:   03/02/12 1830  BP: 105/80  Pulse: 79  Temp: 36.4 C  Resp: 20    Post-op Vital Signs: stable   Complications: No apparent anesthesia complications

## 2012-03-02 NOTE — Transfer of Care (Signed)
Immediate Anesthesia Transfer of Care Note  Patient: Melissa Leach  Procedure(s) Performed: Procedure(s) (LRB) with comments: TOTAL HIP ARTHROPLASTY ANTERIOR APPROACH (Right)  Patient Location: PACU  Anesthesia Type:Regional and Spinal  Level of Consciousness: awake, sedated and patient cooperative  Airway & Oxygen Therapy: Patient Spontanous Breathing and Patient connected to face mask oxygen  Post-op Assessment: Report given to PACU RN and Post -op Vital signs reviewed and stable  Post vital signs: Reviewed and stable  Complications: No apparent anesthesia complications

## 2012-03-03 LAB — BASIC METABOLIC PANEL
CO2: 26 mEq/L (ref 19–32)
Calcium: 8.8 mg/dL (ref 8.4–10.5)
Creatinine, Ser: 0.75 mg/dL (ref 0.50–1.10)

## 2012-03-03 LAB — CBC
MCH: 30.2 pg (ref 26.0–34.0)
MCV: 93.2 fL (ref 78.0–100.0)
Platelets: 209 10*3/uL (ref 150–400)
RBC: 3.24 MIL/uL — ABNORMAL LOW (ref 3.87–5.11)

## 2012-03-03 NOTE — Progress Notes (Signed)
Patient ID: Melissa Leach, female   DOB: 10-23-1950, 61 y.o.   MRN: [id number] Subjective: 1 Day Post-Op Procedure(s) (LRB): TOTAL HIP ARTHROPLASTY ANTERIOR APPROACH (Right)    Patient reports pain as mild.  Doing fine.  Has right lateral leg pain for which we are working on as outpatient  Objective:   VITALS:   Filed Vitals:   03/03/12 0629  BP: 105/72  Pulse: 90  Temp: 97.9 F (36.6 C)  Resp: 14    Neurovascular intact Incision: dressing C/D/I, HV removed today  LABS  Basename 03/03/12 0452 03/02/12 1300  HGB 9.8* 11.7*  HCT 30.2* 35.4*  WBC 7.3 7.7  PLT 209 262     Basename 03/03/12 0452 03/02/12 1300  NA 139 139  K 3.6 3.6  BUN 16 21  CREATININE 0.75 0.77  GLUCOSE 112* 94     Basename 03/02/12 1300  LABPT --  INR 0.99     Assessment/Plan: 1 Day Post-Op Procedure(s) (LRB): TOTAL HIP ARTHROPLASTY ANTERIOR APPROACH (Right)   Advance diet Up with therapy Plan for discharge tomorrow with home exercises provided by hospital staff

## 2012-03-03 NOTE — Evaluation (Addendum)
Occupational Therapy Evaluation Patient Details Name: Melissa Leach MRN: [id number] DOB: 02-Sep-1950 Today's Date: 03/03/2012 Time:  8:23-8:59am    OT Assessment / Plan / Recommendation Clinical Impression  Pt is s/p R THA & should benefit from acute OT to further address shower transfers and LB dressing prior to anticipated d/c home tomorrow.    OT Assessment  Patient needs continued OT Services    Follow Up Recommendations  Initial Supervision/Assistance - 24 hour, then intermittant         Equipment Recommendations  Other (comment) (Long handled sponge)        Frequency  Min 2X/week    Precautions / Restrictions   Falls, R THA, WBAT RLE, TED hose  Pertinent Vitals/Pain R LE surgical pain, 3/10    ADL  Eating/Feeding: Independent Where Assessed - Eating/Feeding: Bed level Grooming: Performed;Wash/dry hands;Wash/dry face;Teeth care;Brushing hair;Applying makeup;Set up Where Assessed - Grooming: Supine, head of bed up Upper Body Bathing: Performed;Abdomen;Right arm;Left arm;Chest;Set up Where Assessed - Upper Body Bathing: Supine, head of bed up Lower Body Bathing: Simulated;Minimal assistance Where Assessed - Lower Body Bathing: Supine, head of bed up Upper Body Dressing: Simulated;Set up Where Assessed - Upper Body Dressing: Supine, head of bed up Lower Body Dressing: Performed;Minimal assistance;Other (comment) (Don socks) Where Assessed - Lower Body Dressing: Supine, head of bed up Toilet Transfer: Simulated;Min guard Toilet Transfer Method: Sit to Barista: Comfort height toilet Toileting - Clothing Manipulation and Hygiene: Simulated;Supervision/safety Where Assessed - Engineer, mining and Hygiene: Sit on 3-in-1 or toilet Tub/Shower Transfer Method: Not assessed Equipment Used: Rolling walker ADL Comments: Pt planning to return home with PRN assist from family at d/c. Recommend assessment of shower transfers and LB  dressing. Pt has A/E (except LH sponge).    OT Diagnosis: Generalized weakness;Acute pain  OT Problem List: Decreased activity tolerance;Decreased knowledge of use of DME or AE;Pain;Other (comment) (Lives alone) OT Treatment Interventions: Self-care/ADL training;DME and/or AE instruction;Therapeutic activities;Patient/family education   OT Goals Acute Rehab OT Goals OT Goal Formulation: With patient Time For Goal Achievement: 03/10/12 Potential to Achieve Goals: Good ADL Goals Pt Will Perform Lower Body Bathing: with supervision;Sit to stand from chair;Sit to stand in shower ADL Goal: Lower Body Bathing - Progress: Goal set today Pt Will Perform Lower Body Dressing: with min assist;Sit to stand from chair;Sit to stand from bed;with adaptive equipment ADL Goal: Lower Body Dressing - Progress: Goal set today Pt Will Transfer to Toilet: with supervision;Comfort height toilet;Grab bars;Maintaining hip precautions ADL Goal: Toilet Transfer - Progress: Goal set today Pt Will Perform Toileting - Clothing Manipulation: with modified independence;Sitting on 3-in-1 or toilet ADL Goal: Toileting - Clothing Manipulation - Progress: Goal set today Pt Will Perform Toileting - Hygiene: with modified independence;Sitting on 3-in-1 or toilet ADL Goal: Toileting - Hygiene - Progress: Goal set today Pt Will Perform Tub/Shower Transfer: Shower transfer;with supervision;Transfer tub bench;Shower seat with back;Grab bars ADL Goal: Tub/Shower Transfer - Progress: Goal set today  Visit Information  Last OT Received On: 03/03/12    Subjective Data  Subjective: "I've had 6 total joint replacements"   Prior Functioning     Home Living Lives With: Alone Available Help at Discharge: Other (Comment);Family (Daughter & sister in law to assist PRN) Type of Home: House Home Access: Stairs to enter Secretary/administrator of Steps: 1 STE Entrance Stairs-Rails: Right Home Layout: One level Bathroom  Shower/Tub: Walk-in shower;Other (comment) (with bench and handheld shower head, grab bars) Bathroom Toilet: Handicapped height  Bathroom Accessibility: Yes How Accessible: Accessible via walker Home Adaptive Equipment: Grab bars in shower;Grab bars around toilet;Hand-held shower hose;Bedside commode/3-in-1;Built-in shower seat;Walker - rolling;Straight cane;Reacher;Other (comment) (Long handled shoe horn) Prior Function Level of Independence: Independent Able to Take Stairs?: Yes Driving: Yes Vocation: On disability 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)  Communication Communication: No difficulties Dominant Hand: Right         Vision/Perception  Wears glasses at all times, no change from baseline    Cognition  Overall Cognitive Status: Appears within functional limits for tasks assessed/performed Arousal/Alertness: Awake/alert Orientation Level: Appears intact for tasks assessed Behavior During Session: [facility] for tasks performed    Extremity/Trunk Assessment Right Upper Extremity Assessment RUE ROM/Strength/Tone: Within functional levels RUE Sensation: WFL - Light Touch RUE Coordination: WFL - gross/fine motor Left Upper Extremity Assessment LUE ROM/Strength/Tone: Within functional levels LUE Sensation: WFL - Light Touch LUE Coordination: WFL - gross/fine motor     Mobility Bed Mobility Bed Mobility: Supine to Sit Supine to Sit: 4: Min assist Transfers Transfers: Sit to Stand Sit to Stand: 4: Min guard Details for Transfer Assistance: Pt attempted to pull up on RW               End of Session OT - End of Session Equipment Utilized During Treatment: Gait belt Activity Tolerance: Patient tolerated treatment well Patient left: in chair;with call bell/phone within reach   OT Treatment Session (x53min total): Pt seen for individual OT treatment session after completition of eval today, with focus on ADL's & self care. Discussed a/e recommendations  (to include long handled sponge, while seated for use with bathing LE's & then educated on how to use to assist with drying off after bathing as well) for use at home to maximize level of independence. Pt verbalized understanding of recommendations as discussed today.     Melissa Awkward Dixon 03/03/2012, 9:02 AM

## 2012-03-03 NOTE — Care Management Note (Addendum)
    Page 1 of 2   03/04/2012     2:32:44 PM   CARE MANAGEMENT NOTE 03/04/2012  Patient:  Melissa Leach, Melissa Leach   Account Number:  [id number]  Date Initiated:  03/03/2012  Documentation initiated by:  Colleen Can  Subjective/Objective Assessment:   dx osteoarthritis right hip; hip replacemnt-anteriro approach     Action/Plan:   CM spoke with patient . Plans are for her to return to her home in [place] where her sister-in-law will be caregiver. States she already has dme from previous joint replacemnt surgeries.   Anticipated DC Date:  03/05/2012   Anticipated DC Plan:  HOME W HOME HEALTH SERVICES  In-house referral  Clinical Social Worker      DC Planning Services  CM consult      [facility] Choice  NA   Choice offered to / List presented to:  NA   DME arranged  NA      DME agency  NA     HH arranged  HH - 11 Patient Refused      HH agency  NA   Status of service:  Completed, signed off Medicare Important Message given?  NO (If response is "NO", the following Medicare IM given date fields will be blank) Date Medicare IM given:   Date Additional Medicare IM given:    Discharge Disposition:  HOME/SELF CARE  Per UR Regulation:    If discussed at Long Length of Stay Meetings, dates discussed:    Comments:  03/03/2012 Colleen Can BSN RN CCM [phone] Pt states she will do own physical therapy; has no more home or outpatient physical therapy benefits left on insurance for this year.

## 2012-03-03 NOTE — Progress Notes (Signed)
CSW consulted for SNF placement. PN reviewed. Pt plans to return home following hospital d/c. RNCM will assist with [facility], if required.  Cori Razor LCSW [phone]

## 2012-03-03 NOTE — Progress Notes (Signed)
Physical Therapy Treatment Patient Details Name: Pheonix Wisby MRN: [id number] DOB: [birth year] Today's Date: 03/03/2012 Time: 5621-3086 PT Time Calculation (min): 24 min  PT Assessment / Plan / Recommendation Comments on Treatment Session  POD #1 for R direct anterior THA. Pt doing well with mobility, she walked 86' with RW. Instructed pt in home exercise program, issued handout of exercises. Pain limits activity tolerance.     Follow Up Recommendations  No PT follow up (Pt states plans to rehab on own - had other hip earlier )     Does the patient have the potential to tolerate intense rehabilitation     Barriers to Discharge        Equipment Recommendations  Other (comment);None recommended by PT (Long handled sponge)    Recommendations for Other Services OT consult  Frequency 7X/week   Plan Discharge plan remains appropriate;Frequency remains appropriate    Precautions / Restrictions Precautions Precautions: Fall Precaution Comments: No precautions Restrictions Weight Bearing Restrictions: Yes RLE Weight Bearing: Weight bearing as tolerated   Pertinent Vitals/Pain *7/10 R hip with walking Ice applied, pt premedicated**    Mobility  Bed Mobility Bed Mobility: Supine to Sit;Sit to Supine Supine to Sit: 4: Min assist Sit to Supine: 4: Min assist Details for Bed Mobility Assistance: min A for RLE Transfers Transfers: Sit to Stand;Stand to Sit Sit to Stand: 4: Min guard Stand to Sit: 4: Min guard Details for Transfer Assistance: cues for LE management and use of UEs Ambulation/Gait Ambulation/Gait Assistance: 4: Min guard Ambulation Distance (Feet): 75 Feet Assistive device: Rolling walker Gait Pattern: Step-through pattern General Gait Details: good sequencing, no LOB, distance limited by R hip pain    Exercises Total Joint Exercises Ankle Circles/Pumps: AROM;Both;10 reps;Supine Quad Sets: AROM;Both;10 reps;Supine Short Arc Quad: AAROM;Right;10  reps;Supine Heel Slides: AAROM;Right;10 reps;Supine Hip ABduction/ADduction: AAROM;Right;10 reps;Supine   PT Diagnosis:    PT Problem List:   PT Treatment Interventions:     PT Goals Acute Rehab PT Goals PT Goal Formulation: With patient Time For Goal Achievement: 03/11/12 Potential to Achieve Goals: Good Pt will go Supine/Side to Sit: with supervision PT Goal: Supine/Side to Sit - Progress: Progressing toward goal Pt will go Sit to Supine/Side: with supervision PT Goal: Sit to Supine/Side - Progress: Progressing toward goal Pt will go Sit to Stand: with supervision PT Goal: Sit to Stand - Progress: Progressing toward goal Pt will go Stand to Sit: with supervision PT Goal: Stand to Sit - Progress: Progressing toward goal Pt will Ambulate: 51 - 150 feet;with supervision;with rolling walker PT Goal: Ambulate - Progress: Progressing toward goal Pt will Go Up / Down Stairs: 1-2 stairs;with min assist;with rolling walker  Visit Information  Last PT Received On: 03/03/12 Assistance Needed: +1    Subjective Data  Subjective: I don't remember the last one hurting this much.  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

## 2012-03-03 NOTE — Progress Notes (Signed)
Utilization review completed.  

## 2012-03-03 NOTE — Evaluation (Signed)
Physical Therapy Evaluation Patient Details Name: Melissa Leach MRN: [id number] DOB: 06-07-1950 Today's Date: 03/03/2012 Time: 4098-1191 PT Time Calculation (min): 21 min  PT Assessment / Plan / Recommendation Clinical Impression  Pt s/p T THR presents with decreased L LE strength/ROM and post op pain limiting functional mobility    PT Assessment  Patient needs continued PT services    Follow Up Recommendations  No PT follow up (Pt states plans to rehab on own - had other hip earlier )    Does the patient have the potential to tolerate intense rehabilitation      Barriers to Discharge None      Equipment Recommendations  Other (comment);None recommended by PT (Long handled sponge)    Recommendations for Other Services OT consult   Frequency 7X/week    Precautions / Restrictions Precautions Precautions: Fall Precaution Comments: No precautions Restrictions Weight Bearing Restrictions: Yes RLE Weight Bearing: Weight bearing as tolerated   Pertinent Vitals/Pain 7/10 with activity; premedicated, ice packs provided, RN aware and providing MR      Mobility  Bed Mobility Bed Mobility: Supine to Sit Supine to Sit: 4: Min assist Transfers Transfers: Sit to Stand;Stand to Sit Sit to Stand: 4: Min guard Stand to Sit: 4: Min guard Details for Transfer Assistance: cues for LE management and use of UEs Ambulation/Gait Ambulation/Gait Assistance: 4: Min assist Ambulation Distance (Feet): 67 Feet Assistive device: Rolling walker Ambulation/Gait Assistance Details: cues for posture, position from RW, stride length, and to decrease pace Gait Pattern: Step-to pattern;Step-through pattern    Shoulder Instructions     Exercises     PT Diagnosis: Difficulty walking  PT Problem List: Decreased strength;Decreased range of motion;Decreased activity tolerance;Decreased mobility;Decreased knowledge of use of DME;Pain PT Treatment Interventions: DME instruction;Gait training;Stair  training;Functional mobility training;Therapeutic activities;Therapeutic exercise;Patient/family education   PT Goals Acute Rehab PT Goals PT Goal Formulation: With patient Time For Goal Achievement: 03/11/12 Potential to Achieve Goals: Good Pt will go Supine/Side to Sit: with supervision PT Goal: Supine/Side to Sit - Progress: Goal set today Pt will go Sit to Supine/Side: with supervision PT Goal: Sit to Supine/Side - Progress: Goal set today Pt will go Sit to Stand: with supervision PT Goal: Sit to Stand - Progress: Goal set today Pt will go Stand to Sit: with supervision PT Goal: Stand to Sit - Progress: Goal set today Pt will Ambulate: 51 - 150 feet;with supervision;with rolling walker PT Goal: Ambulate - Progress: Goal set today Pt will Go Up / Down Stairs: 1-2 stairs;with min assist;with rolling walker PT Goal: Up/Down Stairs - Progress: Goal set today  Visit Information  Last PT Received On: 03/03/12 Assistance Needed: +1    Subjective Data  Subjective: I think this is hurts more than the last one Patient Stated Goal: Resume active lifestyle with decreased pain   Prior Functioning  Home Living Lives With: Alone Available Help at Discharge: Other (Comment);Family Type of Home: House Home Access: Stairs to enter [employer] of Steps: 1 STE Entrance Stairs-Rails: Right Home Layout: One level Bathroom Shower/Tub: Walk-in shower;Other (comment) Bathroom Toilet: Handicapped height Bathroom Accessibility: Yes How Accessible: Accessible via walker Home Adaptive Equipment: Grab bars in shower;Grab bars around toilet;Hand-held shower hose;Bedside commode/3-in-1;Built-in shower seat;Walker - rolling;Straight cane;Reacher;Other (comment) Prior Function Level of Independence: Independent Able to Take Stairs?: Yes Driving: Yes Vocation: On disability 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)  Communication Communication: No difficulties Dominant  Hand: Right    Cognition  Overall Cognitive  Status: Appears within functional limits for tasks assessed/performed Arousal/Alertness: Awake/alert Orientation Level: Appears intact for tasks assessed Behavior During Session: [facility] for tasks performed    Extremity/Trunk Assessment Right Upper Extremity Assessment RUE ROM/Strength/Tone: Within functional levels RUE Sensation: WFL - Light Touch RUE Coordination: WFL - gross/fine motor Left Upper Extremity Assessment LUE ROM/Strength/Tone: Within functional levels LUE Sensation: WFL - Light Touch LUE Coordination: WFL - gross/fine motor Right Lower Extremity Assessment RLE ROM/Strength/Tone: Deficits Left Lower Extremity Assessment LLE ROM/Strength/Tone: WFL for tasks assessed   Balance    End of Session PT - End of Session Equipment Utilized During Treatment: Gait belt Activity Tolerance: Patient limited by pain;Patient tolerated treatment well Patient left: in chair;with call bell/phone within reach Nurse Communication: Mobility status  GP     Melissa Leach 03/03/2012, 12:06 PM

## 2012-03-04 ENCOUNTER — Encounter (HOSPITAL_COMMUNITY): Payer: Self-pay | Admitting: Orthopedic Surgery

## 2012-03-04 DIAGNOSIS — D500 Iron deficiency anemia secondary to blood loss (chronic): Secondary | ICD-10-CM

## 2012-03-04 LAB — CBC
MCH: 30.5 pg (ref 26.0–34.0)
MCV: 92.2 fL (ref 78.0–100.0)
Platelets: 234 10*3/uL (ref 150–400)
RDW: 14.6 % (ref 11.5–15.5)
WBC: 8.9 10*3/uL (ref 4.0–10.5)

## 2012-03-04 LAB — BASIC METABOLIC PANEL
Calcium: 9.2 mg/dL (ref 8.4–10.5)
Chloride: 106 mEq/L (ref 96–112)
Creatinine, Ser: 0.7 mg/dL (ref 0.50–1.10)
GFR calc Af Amer: >90 mL/min (ref >90)
GFR calc non Af Amer: >90 mL/min (ref >90)

## 2012-03-04 MED ORDER — METHOCARBAMOL 500 MG PO TABS: 500.0000 mg | ORAL_TABLET | Freq: Four times a day (QID) | ORAL | Status: DC | PRN

## 2012-03-04 MED ORDER — DSS 100 MG PO CAPS: 100.0000 mg | ORAL_CAPSULE | Freq: Two times a day (BID) | ORAL | Status: DC

## 2012-03-04 MED ORDER — DIPHENHYDRAMINE HCL 25 MG PO CAPS: 25.0000 mg | ORAL_CAPSULE | Freq: Four times a day (QID) | ORAL | Status: DC | PRN

## 2012-03-04 MED ORDER — POLYETHYLENE GLYCOL 3350 17 G PO PACK: 17.0000 g | PACK | Freq: Two times a day (BID) | ORAL | Status: DC

## 2012-03-04 MED ORDER — FERROUS SULFATE 325 (65 FE) MG PO TABS: 325.0000 mg | ORAL_TABLET | Freq: Three times a day (TID) | ORAL | Status: DC

## 2012-03-04 MED ORDER — ASPIRIN EC 325 MG PO TBEC: 325.0000 mg | DELAYED_RELEASE_TABLET | Freq: Two times a day (BID) | ORAL | Status: DC

## 2012-03-04 MED ORDER — OXYCODONE HCL 5 MG PO TABS: 5.0000 mg | ORAL_TABLET | ORAL | Status: DC | PRN

## 2012-03-04 NOTE — Progress Notes (Signed)
OT Note:  Pt verbalizes understanding of all adls and bathroom transfers.  She has had several ortho surgeries in the past.  Will sign off.  [place], OTR/L [phone] 03/04/2012

## 2012-03-04 NOTE — Progress Notes (Signed)
   Subjective: 2 Days Post-Op Procedure(s) (LRB): TOTAL HIP ARTHROPLASTY ANTERIOR APPROACH (Right)   Patient reports pain as mild, pain well controlled. No events throughout the night. Ready to be discharged home.  Objective:   VITALS:   Filed Vitals:   03/04/12 0531  BP: 98/65  Pulse: 88  Temp: 97.9 F (36.6 C)  Resp: 14    Neurovascular intact Dorsiflexion/Plantar flexion intact Incision: dressing C/D/I No cellulitis present Compartment soft  LABS  Basename 03/04/12 0427 03/03/12 0452 03/02/12 1300  HGB 10.5* 9.8* 11.7*  HCT 31.7* 30.2* 35.4*  WBC 8.9 7.3 7.7  PLT 234 209 262     Basename 03/04/12 0427 03/03/12 0452 03/02/12 1300  NA 139 139 139  K 4.2 3.6 3.6  BUN 13 16 21   CREATININE 0.70 0.75 0.77  GLUCOSE 129* 112* 94     Assessment/Plan: 2 Days Post-Op Procedure(s) (LRB): TOTAL HIP ARTHROPLASTY ANTERIOR APPROACH (Right) Up with therapy Discharge home with home health Follow up in 2 weeks at [facility]. Follow up with OLIN,Andriy Sherk D in 2 weeks.  Contact information:  [facility] [address] [place] [address] [phone]    Expected ABLA  Treated with iron and will observe  Obese (BMI 30-39.9) Estimated Body mass index is 34.41 kg/(m^2) as calculated from the following:   Height as of this encounter: 5\' 1" (1.549 m).   Weight as of this encounter: 182 lb 2 oz(82.611 kg). Patient also counseled that weight may inhibit the healing process Patient counseled that losing weight will help with future health issues       Melissa Leach   PAC  03/04/2012, 8:55 AM

## 2012-03-04 NOTE — Progress Notes (Signed)
Discharge summary sent to payer through [facility]  

## 2012-03-04 NOTE — Progress Notes (Signed)
Physical Therapy Treatment Patient Details Name: Seri Kimmer MRN: [id number] DOB: January 23, 1951 Today's Date: 03/04/2012 Time: 4098-1191 PT Time Calculation (min): 40 min  PT Assessment / Plan / Recommendation Comments on Treatment Session  POD # 2 am session Direct Anterior Approach THR plans to D/C to home today.  This is pt's 4th ortho surgery and is very knowledgable.  Amb in hallway and practiced going up/down one step using RW.  Pt given handout on HEP and performed all.    Follow Up Recommendations  No PT follow up (Pt states she is max out with HH visits and plans to Rehab h)     Does the patient have the potential to tolerate intense rehabilitation     Barriers to Discharge        Equipment Recommendations       Recommendations for Other Services    Frequency 7X/week   Plan Discharge plan remains appropriate    Precautions / Restrictions Precautions Precautions: None Precaution Comments: Direct Anterior THR Restrictions Weight Bearing Restrictions: No RLE Weight Bearing: Weight bearing as tolerated   Pertinent Vitals/Pain No c/o pain "tightness'    Mobility  Bed Mobility Bed Mobility: Supine to Sit;Sit to Supine Details for Bed Mobility Assistance: Min assist only to support R LE on/off bed Transfers Transfers: Sit to Stand;Stand to Sit Sit to Stand: From bed;From toilet;6: Modified independent (Device/Increase time) Stand to Sit: 6: Modified independent (Device/Increase time);To toilet;To bed Details for Transfer Assistance: good safety cognition and use of hands Ambulation/Gait Ambulation/Gait Assistance: 5: Supervision Ambulation Distance (Feet): 115 Feet Assistive device: Rolling walker Ambulation/Gait Assistance Details: good alternating gait and safety Gait Pattern: Step-through pattern Gait velocity: decreased Stairs: Yes Stairs Assistance: 5: Supervision Stairs Assistance Details (indicate cue type and reason): Only one initial VC for  sequencing Stair Management Technique: No rails;Forwards;With walker    Exercises Total Joint Exercises Ankle Circles/Pumps: AROM;Both;10 reps;Supine Quad Sets: AROM;Both;10 reps;Supine Gluteal Sets: AROM;Both;10 reps;Supine Short Arc Quad: AROM;Right;10 reps;Supine Heel Slides: AAROM;Right;10 reps;Supine Hip ABduction/ADduction: AROM;Right;10 reps;Supine Straight Leg Raises: AAROM;Right;10 reps;Supine Long Arc Quad: AROM;Right;10 reps;Supine Knee Flexion: AROM;Right;10 reps;Supine Standing Hip Extension: AROM;Right;10 reps;Standing    PT Goals                                                   progressing    Visit Information  Last PT Received On: 03/04/12    Subjective Data  Subjective: This is my 4th replacement surgery Patient Stated Goal: home   Cognition       Balance     End of Session PT - End of Session Equipment Utilized During Treatment: Gait belt Activity Tolerance: Patient tolerated treatment well Patient left: in bed;with call bell/phone within reach (ICE to R hip)   Felecia Shelling  PTA [facility]  Acute  Rehab Pager     [phone]

## 2012-03-11 NOTE — Discharge Summary (Signed)
Physician Discharge Summary  Patient ID: Melissa Leach MRN: [id number] DOB/AGE: 03-Jul-1950 61 y.o.  Admit date: 03/02/2012 Discharge date: 03/04/2012   Procedures:  Procedure(s) (LRB): TOTAL HIP ARTHROPLASTY ANTERIOR APPROACH (Right)  Attending Physician:  Dr. Durene Romans   Admission Diagnoses:   Right hip OA / pain  Discharge Diagnoses:  Principal Problem:  *S/P right THA, AA Active Problems:  Expected blood loss anemia Gastritis   H/O hiatal hernia  Headache - MIGRAINES   Arthritis  Anxiety   Depression    HPI: Melissa Leach, 61 y.o. female, has a history of pain and functional disability in the right hip(s) due to arthritis and patient has failed non-surgical conservative treatments for greater than 12 weeks to include NSAID's and/or analgesics, corticosteriod injections and activity modification. Onset of symptoms was gradual starting 2 years ago with gradually worsening course since that time.The patient noted no past surgery on the right hip(s). Patient currently rates pain in the right hip at 8 out of 10 with activity. Patient has worsening of pain with activity and weight bearing, pain that interfers with activities of daily living and pain with passive range of motion. Patient has evidence of periarticular osteophytes and joint space narrowing by imaging studies. This condition presents safety issues increasing the risk of falls. There is no current active infection. Risks, benefits and expectations were discussed with the patient. Patient understand the risks, benefits and expectations and wishes to proceed with surgery.   PCP: Emeterio [name], MD   Discharged Condition: good  Hospital Course:  Patient underwent the above stated procedure on 03/02/2012. Patient tolerated the procedure well and brought to the recovery room in good condition and subsequently to the floor.  POD #1 BP: 105/72 ; Pulse: 90 ; Temp: 97.9 F (36.6 C) ; Resp: 14  Pt's foley was  removed, as well as the hemovac drain removed. IV was changed to a saline lock. Patient reports pain as mild. Doing fine. Has right lateral leg pain for which we are working on as outpatient. Neurovascular intact, dorsiflexion/plantar flexion intact, incision: dressing C/D/I, no cellulitis present and compartment soft.   LABS  Basename  03/03/12 0452   HGB  9.8  HCT  30.2   POD #2  BP: 98/65 ; Pulse: 88 ; Temp: 97.9 F (36.6 C) ; Resp: 14  Patient reports pain as mild, pain well controlled. No events throughout the night. Ready to be discharged home. Neurovascular intact, dorsiflexion/plantar flexion intact, incision: dressing C/D/I, no cellulitis present and compartment soft.   LABS  Basename  03/04/12 0427  HGB  10.5  HCT  31.7    Discharge Exam: General appearance: alert, cooperative and no distress Extremities: Homans sign is negative, no sign of DVT, no edema, redness or tenderness in the calves or thighs and no ulcers, gangrene or trophic changes  Disposition:   Home-Health Care Svc with follow up in 2 weeks   Follow-up Information    Follow up with [name], MD. Schedule an appointment as soon as possible for a visit in 2 weeks.   Contact information:   [address] [place] [address] [phone]          Discharge Orders    Future Orders Please Complete By Expires   Diet - low sodium heart healthy      Call MD / Call 911      Comments:   If you experience chest pain or shortness of breath, CALL 911 and  be transported to the hospital emergency room.  If you develope a fever above 101 F, pus (white drainage) or increased drainage or redness at the wound, or calf pain, call your surgeon's office.   Discharge instructions      Comments:   Maintain surgical dressing for 10-14 days, then replace with gauze and tape. Keep the area dry and clean until follow up. Follow up in 2 weeks at [facility] Call with any questions or concerns.     Constipation Prevention      Comments:   Drink plenty of fluids.  Prune juice may be helpful.  You may use a stool softener, such as Colace (over the counter) 100 mg twice a day.  Use MiraLax (over the counter) for constipation as needed.   Increase activity slowly as tolerated      Driving restrictions      Comments:   No driving for 4 weeks   Change dressing      Comments:   Maintain surgical dressing for 10-14 days, then replace with 4x4 guaze and tape. Keep the area dry and clean.   TED hose      Comments:   Use stockings (TED hose) for 2 weeks on both leg(s).  You may remove them at night for sleeping.      Discharge Medication List as of 03/04/2012 10:47 AM    START taking these medications   Details  aspirin EC 325 MG tablet Take 1 tablet (325 mg total) by mouth 2 (two) times daily. X 4 weeks, Starting 03/04/2012, Until Discontinued, No Print    diphenhydrAMINE (BENADRYL) 25 mg capsule Take 1 capsule (25 mg total) by mouth every 6 (six) hours as needed for itching, allergies or sleep., Starting 03/04/2012, Until Discontinued, No Print    docusate sodium 100 MG CAPS Take 100 mg by mouth 2 (two) times daily., Starting 03/04/2012, Until Discontinued, No Print    ferrous sulfate 325 (65 FE) MG tablet Take 1 tablet (325 mg total) by mouth 3 (three) times daily after meals., Starting 03/04/2012, Until Discontinued, No Print    methocarbamol (ROBAXIN) 500 MG tablet Take 1 tablet (500 mg total) by mouth every 6 (six) hours as needed (muscle spasms)., Starting 03/04/2012, Until Discontinued, Print    polyethylene glycol (MIRALAX / GLYCOLAX) packet Take 17 g by mouth 2 (two) times daily., Starting 03/04/2012, Until Discontinued, No Print      CONTINUE these medications which have CHANGED   Details  oxyCODONE (OXY IR/ROXICODONE) 5 MG immediate release tablet Take 1-3 tablets (5-15 mg total) by mouth every 4 (four) hours as needed for pain., Starting 03/04/2012, Until Discontinued, Print       CONTINUE these medications which have NOT CHANGED   Details  atorvastatin (LIPITOR) 40 MG tablet Take 20 mg by mouth every evening. Takes 1/2 tablet, Until Discontinued, Historical Med    buPROPion (WELLBUTRIN XL) 300 MG 24 hr tablet Take 300 mg by mouth every evening., Until Discontinued, Historical Med    LORazepam (ATIVAN) 0.5 MG tablet Take 0.5 mg by mouth every 8 (eight) hours as needed. For anxiety/sleep, Until Discontinued, Historical Med    omeprazole (PRILOSEC) 40 MG capsule Take 40 mg by mouth every evening., Until Discontinued, Historical Med    predniSONE (STERAPRED UNI-PAK) 5 MG TABS Take 5 mg by mouth as directed. Follow package directions  Pt just started today 11/27 per direction dr. Charlann Boxer--, Until Discontinued, Historical Med    pregabalin (LYRICA) 75 MG capsule Take 75  mg by mouth 2 (two) times daily., Until Discontinued, Historical Med    topiramate (TOPAMAX) 50 MG tablet Take 100 mg by mouth every evening., Until Discontinued, Historical Med    ALPRAZolam (XANAX) 0.5 MG tablet Take 0.25 mg by mouth at bedtime as needed. Takes 1/2 tablet if needed for sleep, Until Discontinued, Historical Med    calcium-vitamin D (OSCAL WITH D) 500-200 MG-UNIT per tablet Take 2 tablets by mouth daily. , Until Discontinued, Historical Med    Multiple Vitamin (MULTIVITAMIN WITH MINERALS) TABS Take 1 tablet by mouth daily., Until Discontinued, Historical Med    SUMAtriptan (IMITREX) 100 MG tablet Take 100 mg by mouth every 2 (two) hours as needed. For migraine, Until Discontinued, Historical Med      STOP taking these medications     meloxicam (MOBIC) 15 MG tablet Comments:  Reason for Stopping:           Signed: Anastasio Auerbach. Stavroula Rohde   PAC  03/11/2012, 10:14 AM

## 2012-03-13 ENCOUNTER — Other Ambulatory Visit: Payer: Self-pay | Admitting: Orthopedic Surgery

## 2012-03-13 DIAGNOSIS — M5136 Other intervertebral disc degeneration, lumbar region: Secondary | ICD-10-CM

## 2012-03-16 ENCOUNTER — Ambulatory Visit
Admission: RE | Admit: 2012-03-16 | Discharge: 2012-03-16 | Disposition: A | Discharge status: Home / Self Care | Payer: BC Managed Care – PPO | Source: Ambulatory Visit | Attending: Orthopedic Surgery | Admitting: Orthopedic Surgery

## 2012-03-16 VITALS — BP 129/56 | HR 94

## 2012-03-16 DIAGNOSIS — M51369 Other intervertebral disc degeneration, lumbar region without mention of lumbar back pain or lower extremity pain: Secondary | ICD-10-CM

## 2012-03-16 DIAGNOSIS — M5136 Other intervertebral disc degeneration, lumbar region: Secondary | ICD-10-CM

## 2012-03-16 DIAGNOSIS — M5126 Other intervertebral disc displacement, lumbar region: Secondary | ICD-10-CM

## 2012-03-16 MED ORDER — IOHEXOL 180 MG/ML  SOLN
1.0000 mL | Freq: Once | INTRAMUSCULAR | Status: AC | PRN
  Administered 2012-03-16: 1 mL via EPIDURAL

## 2012-03-16 MED ORDER — METHYLPREDNISOLONE ACETATE 40 MG/ML INJ SUSP (RADIOLOG
120.0000 mg | Freq: Once | INTRAMUSCULAR | Status: AC
  Administered 2012-03-16: 120 mg via EPIDURAL

## 2012-03-26 LAB — HM DEXA SCAN

## 2012-04-20 ENCOUNTER — Other Ambulatory Visit: Payer: Self-pay | Admitting: Orthopedic Surgery

## 2012-04-20 DIAGNOSIS — M5030 Other cervical disc degeneration, unspecified cervical region: Secondary | ICD-10-CM

## 2012-04-22 ENCOUNTER — Other Ambulatory Visit: Payer: Self-pay | Admitting: Neurological Surgery

## 2012-04-22 ENCOUNTER — Ambulatory Visit
Admission: RE | Admit: 2012-04-22 | Discharge: 2012-04-22 | Disposition: A | Discharge status: Home / Self Care | Payer: 59 | Source: Ambulatory Visit | Attending: Orthopedic Surgery | Admitting: Orthopedic Surgery

## 2012-04-22 DIAGNOSIS — M5030 Other cervical disc degeneration, unspecified cervical region: Secondary | ICD-10-CM

## 2012-04-22 MED ORDER — IOHEXOL 180 MG/ML  SOLN
1.0000 mL | Freq: Once | INTRAMUSCULAR | Status: AC | PRN
  Administered 2012-04-22: 1 mL via EPIDURAL

## 2012-04-22 MED ORDER — METHYLPREDNISOLONE ACETATE 40 MG/ML INJ SUSP (RADIOLOG
120.0000 mg | Freq: Once | INTRAMUSCULAR | Status: AC
  Administered 2012-04-22: 120 mg via EPIDURAL

## 2012-04-28 ENCOUNTER — Other Ambulatory Visit: Payer: Self-pay | Admitting: Neurological Surgery

## 2012-04-28 DIAGNOSIS — IMO0002 Reserved for concepts with insufficient information to code with codable children: Secondary | ICD-10-CM

## 2012-05-04 ENCOUNTER — Other Ambulatory Visit: Payer: 59

## 2012-05-05 ENCOUNTER — Ambulatory Visit
Admission: RE | Admit: 2012-05-05 | Discharge: 2012-05-05 | Disposition: A | Discharge status: Home / Self Care | Payer: 59 | Source: Ambulatory Visit | Attending: Neurological Surgery | Admitting: Neurological Surgery

## 2012-05-05 DIAGNOSIS — IMO0002 Reserved for concepts with insufficient information to code with codable children: Secondary | ICD-10-CM

## 2012-05-06 ENCOUNTER — Encounter (HOSPITAL_COMMUNITY): Payer: Self-pay | Admitting: Pharmacy Technician

## 2012-05-08 ENCOUNTER — Encounter (HOSPITAL_COMMUNITY)
Admission: RE | Admit: 2012-05-08 | Discharge: 2012-05-08 | Disposition: A | Discharge status: Home / Self Care | Payer: 59 | Source: Ambulatory Visit | Attending: Neurological Surgery | Admitting: Neurological Surgery

## 2012-05-08 ENCOUNTER — Ambulatory Visit (HOSPITAL_COMMUNITY)
Admission: RE | Admit: 2012-05-08 | Discharge: 2012-05-08 | Disposition: A | Discharge status: Home / Self Care | Payer: 59 | Source: Ambulatory Visit | Attending: Neurological Surgery | Admitting: Neurological Surgery

## 2012-05-08 ENCOUNTER — Encounter (HOSPITAL_COMMUNITY): Payer: Self-pay

## 2012-05-08 HISTORY — DX: Nocturia: R35.1

## 2012-05-08 HISTORY — DX: Insomnia, unspecified: G47.00

## 2012-05-08 HISTORY — DX: Personal history of other diseases of the nervous system and sense organs: Z86.69

## 2012-05-08 HISTORY — DX: Hyperlipidemia, unspecified: E78.5

## 2012-05-08 HISTORY — DX: Other chronic pain: G89.29

## 2012-05-08 HISTORY — DX: Pain in unspecified joint: M25.50

## 2012-05-08 HISTORY — DX: Effusion, unspecified joint: M25.40

## 2012-05-08 HISTORY — DX: Gastro-esophageal reflux disease without esophagitis: K21.9

## 2012-05-08 HISTORY — DX: Dorsalgia, unspecified: M54.9

## 2012-05-08 LAB — ABO/RH: ABO/RH(D): O NEG

## 2012-05-08 LAB — BASIC METABOLIC PANEL
BUN: 22 mg/dL (ref 6–23)
Calcium: 9.9 mg/dL (ref 8.4–10.5)
Creatinine, Ser: 0.87 mg/dL (ref 0.50–1.10)
GFR calc Af Amer: 82 mL/min — ABNORMAL LOW (ref >90)

## 2012-05-08 LAB — CBC WITH DIFFERENTIAL/PLATELET
Basophils Absolute: 0.1 10*3/uL (ref 0.0–0.1)
Basophils Relative: 1 % (ref 0–1)
Eosinophils Relative: 3 % (ref 0–5)
Lymphocytes Relative: 23 % (ref 12–46)
MCHC: 32.3 g/dL (ref 30.0–36.0)
Monocytes Absolute: 0.7 10*3/uL (ref 0.1–1.0)
Neutro Abs: 6.2 10*3/uL (ref 1.7–7.7)
Platelets: 289 10*3/uL (ref 150–400)
RDW: 14.3 % (ref 11.5–15.5)
WBC: 9.4 10*3/uL (ref 4.0–10.5)

## 2012-05-08 LAB — SURGICAL PCR SCREEN
MRSA, PCR: NEGATIVE
Staphylococcus aureus: NEGATIVE

## 2012-05-08 LAB — APTT: aPTT: 29 seconds (ref 24–37)

## 2012-05-08 LAB — PROTIME-INR
INR: 0.99 (ref 0.00–1.49)
Prothrombin Time: 13 seconds (ref 11.6–15.2)

## 2012-05-08 LAB — TYPE AND SCREEN
ABO/RH(D): O NEG
Antibody Screen: NEGATIVE

## 2012-05-08 NOTE — Progress Notes (Signed)
Pt doesn't have a cardiologist   Stress test and heart cath done in 1992 and was done bc "failed the stress test".Pt states everything came back fantastic  Denies ever having an echo  Dr.Sharon Wolters  Denies ekg or cxr within past yr

## 2012-05-08 NOTE — Progress Notes (Signed)
Pt states she is seeing an endocrinologist bc her thyroid is out of wack on Wed 05/13/12

## 2012-05-08 NOTE — Pre-Procedure Instructions (Signed)
Melissa Leach  05/08/2012   Your procedure is scheduled on:  Fri, Feb 14 @ 1:48 PM  Report to [facility] at 11:45 AM.  Call this number if you have problems the morning of surgery: [phone]   Remember:   Do not eat food or drink liquids after midnight.   Take these medicines the morning of surgery with A SIP OF WATER: Alprazolam(Xanax) and Lyrica(Pregabalin)   Do not wear jewelry, make-up or nail polish.  Do not wear lotions, powders, or perfumes. You may wear deodorant.  Do not shave 48 hours prior to surgery.   Do not bring valuables to the hospital.  Contacts, dentures or bridgework may not be worn into surgery.  Leave suitcase in the car. After surgery it may be brought to your room.  For patients admitted to the hospital, checkout time is 11:00 AM the day of  discharge.   Patients discharged the day of surgery will not be allowed to drive  home.    Special Instructions: Shower using CHG 2 nights before surgery and the night before surgery.  If you shower the day of surgery use CHG.  Use special wash - you have one bottle of CHG for all showers.  You should use approximately 1/3 of the bottle for each shower.   Please read over the following fact sheets that you were given: Pain Booklet, Coughing and Deep Breathing, Blood Transfusion Information, MRSA Information and Surgical Site Infection Prevention

## 2012-05-13 DIAGNOSIS — E039 Hypothyroidism, unspecified: Secondary | ICD-10-CM

## 2012-05-13 HISTORY — DX: Hypothyroidism, unspecified: E03.9

## 2012-05-14 MED ORDER — CEFAZOLIN SODIUM-DEXTROSE 2-3 GM-% IV SOLR
2.0000 g | INTRAVENOUS | Status: AC
  Administered 2012-05-15: 1 g via INTRAVENOUS
  Administered 2012-05-15: 2 g via INTRAVENOUS
  Filled 2012-05-14: qty 50

## 2012-05-14 MED ORDER — DEXAMETHASONE SODIUM PHOSPHATE 10 MG/ML IJ SOLN
10.0000 mg | INTRAMUSCULAR | Status: AC
  Administered 2012-05-15: 10 mg via INTRAVENOUS
  Filled 2012-05-14: qty 1

## 2012-05-15 ENCOUNTER — Ambulatory Visit (HOSPITAL_COMMUNITY): Payer: 59 | Admitting: Anesthesiology

## 2012-05-15 ENCOUNTER — Inpatient Hospital Stay (HOSPITAL_COMMUNITY)
Admission: RE | Admit: 2012-05-15 | Discharge: 2012-05-17 | DRG: 460 | Disposition: A | Discharge status: Home / Self Care | Payer: 59 | Source: Ambulatory Visit | Attending: Neurological Surgery | Admitting: Neurological Surgery

## 2012-05-15 ENCOUNTER — Encounter (HOSPITAL_COMMUNITY): Payer: Self-pay | Admitting: Anesthesiology

## 2012-05-15 ENCOUNTER — Encounter (HOSPITAL_COMMUNITY): Payer: Self-pay | Admitting: *Deleted

## 2012-05-15 ENCOUNTER — Encounter (HOSPITAL_COMMUNITY)
Admission: RE | Disposition: A | Discharge status: Home / Self Care | Payer: Self-pay | Source: Ambulatory Visit | Attending: Neurological Surgery

## 2012-05-15 ENCOUNTER — Ambulatory Visit (HOSPITAL_COMMUNITY): Payer: 59

## 2012-05-15 DIAGNOSIS — G4700 Insomnia, unspecified: Secondary | ICD-10-CM | POA: Diagnosis present

## 2012-05-15 DIAGNOSIS — F329 Major depressive disorder, single episode, unspecified: Secondary | ICD-10-CM | POA: Diagnosis present

## 2012-05-15 DIAGNOSIS — F411 Generalized anxiety disorder: Secondary | ICD-10-CM | POA: Diagnosis present

## 2012-05-15 DIAGNOSIS — M7130 Other bursal cyst, unspecified site: Secondary | ICD-10-CM | POA: Diagnosis present

## 2012-05-15 DIAGNOSIS — G43909 Migraine, unspecified, not intractable, without status migrainosus: Secondary | ICD-10-CM | POA: Diagnosis present

## 2012-05-15 DIAGNOSIS — Z01812 Encounter for preprocedural laboratory examination: Secondary | ICD-10-CM

## 2012-05-15 DIAGNOSIS — Z96659 Presence of unspecified artificial knee joint: Secondary | ICD-10-CM

## 2012-05-15 DIAGNOSIS — F3289 Other specified depressive episodes: Secondary | ICD-10-CM | POA: Diagnosis present

## 2012-05-15 DIAGNOSIS — Z01818 Encounter for other preprocedural examination: Secondary | ICD-10-CM

## 2012-05-15 DIAGNOSIS — E785 Hyperlipidemia, unspecified: Secondary | ICD-10-CM | POA: Diagnosis present

## 2012-05-15 DIAGNOSIS — M48061 Spinal stenosis, lumbar region without neurogenic claudication: Secondary | ICD-10-CM | POA: Diagnosis present

## 2012-05-15 DIAGNOSIS — Z79899 Other long term (current) drug therapy: Secondary | ICD-10-CM

## 2012-05-15 DIAGNOSIS — K219 Gastro-esophageal reflux disease without esophagitis: Secondary | ICD-10-CM | POA: Diagnosis present

## 2012-05-15 DIAGNOSIS — Q762 Congenital spondylolisthesis: Principal | ICD-10-CM

## 2012-05-15 DIAGNOSIS — E039 Hypothyroidism, unspecified: Secondary | ICD-10-CM | POA: Diagnosis present

## 2012-05-15 HISTORY — PX: LUMBAR SPINE SURGERY: SHX701

## 2012-05-15 HISTORY — DX: Hypothyroidism, unspecified: E03.9

## 2012-05-15 SURGERY — FOR MAXIMUM ACCESS (MAS) POSTERIOR LUMBAR INTERBODY FUSION (PLIF) 2 LEVEL
Anesthesia: General | Site: Spine Lumbar | Laterality: Bilateral | Wound class: Clean

## 2012-05-15 MED ORDER — PREGABALIN 75 MG PO CAPS
75.0000 mg | ORAL_CAPSULE | Freq: Two times a day (BID) | ORAL | Status: DC
  Administered 2012-05-15 – 2012-05-17 (×4): 75 mg via ORAL
  Filled 2012-05-15 (×4): qty 1

## 2012-05-15 MED ORDER — GLYCOPYRROLATE 0.2 MG/ML IJ SOLN
INTRAMUSCULAR | Status: DC | PRN
  Administered 2012-05-15: 0.6 mg via INTRAVENOUS
  Administered 2012-05-15: 0.2 mg via INTRAVENOUS

## 2012-05-15 MED ORDER — THROMBIN 5000 UNITS EX KIT
PACK | CUTANEOUS | Status: DC | PRN
  Administered 2012-05-15: 5000 [IU] via TOPICAL

## 2012-05-15 MED ORDER — FENTANYL CITRATE 0.05 MG/ML IJ SOLN
INTRAMUSCULAR | Status: DC | PRN
  Administered 2012-05-15: 100 ug via INTRAVENOUS
  Administered 2012-05-15 (×2): 50 ug via INTRAVENOUS
  Administered 2012-05-15 (×2): 100 ug via INTRAVENOUS
  Administered 2012-05-15 (×2): 50 ug via INTRAVENOUS
  Administered 2012-05-15: 100 ug via INTRAVENOUS

## 2012-05-15 MED ORDER — PROPOFOL 10 MG/ML IV BOLUS
INTRAVENOUS | Status: DC | PRN
  Administered 2012-05-15: 120 mg via INTRAVENOUS

## 2012-05-15 MED ORDER — ROCURONIUM BROMIDE 100 MG/10ML IV SOLN
INTRAVENOUS | Status: DC | PRN
  Administered 2012-05-15: 50 mg via INTRAVENOUS

## 2012-05-15 MED ORDER — THROMBIN 20000 UNITS EX SOLR
OROMUCOSAL | Status: DC | PRN
  Administered 2012-05-15: 13:00:00 via TOPICAL

## 2012-05-15 MED ORDER — MENTHOL 3 MG MT LOZG: 1.0000 | LOZENGE | OROMUCOSAL | Status: DC | PRN

## 2012-05-15 MED ORDER — LIDOCAINE HCL (CARDIAC) 20 MG/ML IV SOLN
INTRAVENOUS | Status: DC | PRN
  Administered 2012-05-15: 100 mg via INTRAVENOUS

## 2012-05-15 MED ORDER — DEXTROSE 5 % IV SOLN
INTRAVENOUS | Status: DC | PRN
  Administered 2012-05-15 (×2): via INTRAVENOUS

## 2012-05-15 MED ORDER — ALPRAZOLAM 0.25 MG PO TABS
0.2500 mg | ORAL_TABLET | Freq: Every evening | ORAL | Status: DC | PRN
  Administered 2012-05-15 – 2012-05-16 (×2): 0.25 mg via ORAL
  Filled 2012-05-15 (×2): qty 1

## 2012-05-15 MED ORDER — PROPOFOL INFUSION 10 MG/ML OPTIME
INTRAVENOUS | Status: DC | PRN
  Administered 2012-05-15: 50 ug/kg/min via INTRAVENOUS

## 2012-05-15 MED ORDER — SODIUM CHLORIDE 0.9 % IV SOLN
INTRAVENOUS | Status: AC
  Filled 2012-05-15: qty 500

## 2012-05-15 MED ORDER — PHENOL 1.4 % MT LIQD: 1.0000 | OROMUCOSAL | Status: DC | PRN

## 2012-05-15 MED ORDER — ONDANSETRON HCL 4 MG/2ML IJ SOLN: 4.0000 mg | Freq: Once | INTRAMUSCULAR | Status: DC | PRN

## 2012-05-15 MED ORDER — 0.9 % SODIUM CHLORIDE (POUR BTL) OPTIME
TOPICAL | Status: DC | PRN
  Administered 2012-05-15: 1000 mL

## 2012-05-15 MED ORDER — ONDANSETRON HCL 4 MG/2ML IJ SOLN: 4.0000 mg | INTRAMUSCULAR | Status: DC | PRN

## 2012-05-15 MED ORDER — MORPHINE SULFATE 2 MG/ML IJ SOLN
1.0000 mg | INTRAMUSCULAR | Status: DC | PRN
  Administered 2012-05-16 (×2): 2 mg via INTRAVENOUS
  Filled 2012-05-15 (×2): qty 1

## 2012-05-15 MED ORDER — OXYCODONE HCL 5 MG/5ML PO SOLN: 5.0000 mg | Freq: Once | ORAL | Status: DC | PRN

## 2012-05-15 MED ORDER — ARTIFICIAL TEARS OP OINT
TOPICAL_OINTMENT | OPHTHALMIC | Status: DC | PRN
  Administered 2012-05-15: 1 via OPHTHALMIC

## 2012-05-15 MED ORDER — SODIUM CHLORIDE 0.9 % IV SOLN: 250.0000 mL | INTRAVENOUS | Status: DC

## 2012-05-15 MED ORDER — ACETAMINOPHEN 650 MG RE SUPP: 650.0000 mg | RECTAL | Status: DC | PRN

## 2012-05-15 MED ORDER — DEXAMETHASONE SODIUM PHOSPHATE 4 MG/ML IJ SOLN
4.0000 mg | Freq: Four times a day (QID) | INTRAMUSCULAR | Status: DC
  Administered 2012-05-16 (×2): 4 mg via INTRAVENOUS
  Filled 2012-05-15 (×9): qty 1

## 2012-05-15 MED ORDER — SODIUM CHLORIDE 0.9 % IJ SOLN: 3.0000 mL | INTRAMUSCULAR | Status: DC | PRN

## 2012-05-15 MED ORDER — CEFAZOLIN SODIUM 1-5 GM-% IV SOLN
1.0000 g | Freq: Three times a day (TID) | INTRAVENOUS | Status: AC
Start: 2012-05-15 — End: 2012-05-16
  Administered 2012-05-15 – 2012-05-16 (×2): 1 g via INTRAVENOUS
  Filled 2012-05-15 (×2): qty 50

## 2012-05-15 MED ORDER — HYDROMORPHONE HCL PF 1 MG/ML IJ SOLN
0.2500 mg | INTRAMUSCULAR | Status: DC | PRN
  Administered 2012-05-15: 0.5 mg via INTRAVENOUS

## 2012-05-15 MED ORDER — SODIUM CHLORIDE 0.9 % IR SOLN
Status: DC | PRN
  Administered 2012-05-15: 13:00:00

## 2012-05-15 MED ORDER — MEPERIDINE HCL 25 MG/ML IJ SOLN: 6.2500 mg | INTRAMUSCULAR | Status: DC | PRN

## 2012-05-15 MED ORDER — SODIUM CHLORIDE 0.9 % IV SOLN
10.0000 mg | INTRAVENOUS | Status: DC | PRN
  Administered 2012-05-15: 20 ug/min via INTRAVENOUS

## 2012-05-15 MED ORDER — TOPIRAMATE 100 MG PO TABS
100.0000 mg | ORAL_TABLET | Freq: Every evening | ORAL | Status: DC
  Administered 2012-05-15 – 2012-05-16 (×2): 100 mg via ORAL
  Filled 2012-05-15 (×3): qty 1

## 2012-05-15 MED ORDER — METHOCARBAMOL 100 MG/ML IJ SOLN
500.0000 mg | Freq: Four times a day (QID) | INTRAVENOUS | Status: DC | PRN
  Filled 2012-05-15: qty 5

## 2012-05-15 MED ORDER — LACTATED RINGERS IV SOLN
INTRAVENOUS | Status: DC | PRN
  Administered 2012-05-15 (×3): via INTRAVENOUS

## 2012-05-15 MED ORDER — DEXAMETHASONE 4 MG PO TABS
4.0000 mg | ORAL_TABLET | Freq: Four times a day (QID) | ORAL | Status: DC
  Administered 2012-05-15 – 2012-05-17 (×5): 4 mg via ORAL
  Filled 2012-05-15 (×11): qty 1

## 2012-05-15 MED ORDER — ADULT MULTIVITAMIN W/MINERALS CH
1.0000 | ORAL_TABLET | Freq: Every day | ORAL | Status: DC
  Administered 2012-05-15 – 2012-05-17 (×3): 1 via ORAL
  Filled 2012-05-15 (×3): qty 1

## 2012-05-15 MED ORDER — CALCIUM CARBONATE-VITAMIN D 500-200 MG-UNIT PO TABS
2.0000 | ORAL_TABLET | Freq: Every day | ORAL | Status: DC
  Administered 2012-05-15 – 2012-05-17 (×3): 2 via ORAL
  Filled 2012-05-15 (×3): qty 2

## 2012-05-15 MED ORDER — BACITRACIN 50000 UNITS IM SOLR
INTRAMUSCULAR | Status: AC
  Filled 2012-05-15: qty 1

## 2012-05-15 MED ORDER — ACETAMINOPHEN 10 MG/ML IV SOLN
INTRAVENOUS | Status: AC
  Administered 2012-05-15: 1000 mg via INTRAVENOUS
  Filled 2012-05-15: qty 100

## 2012-05-15 MED ORDER — HYDROMORPHONE HCL PF 1 MG/ML IJ SOLN
INTRAMUSCULAR | Status: AC
  Filled 2012-05-15: qty 1

## 2012-05-15 MED ORDER — NEOSTIGMINE METHYLSULFATE 1 MG/ML IJ SOLN
INTRAMUSCULAR | Status: DC | PRN
  Administered 2012-05-15: 5 mg via INTRAVENOUS

## 2012-05-15 MED ORDER — LEVOTHYROXINE SODIUM 25 MCG PO TABS
25.0000 ug | ORAL_TABLET | Freq: Every day | ORAL | Status: DC
  Administered 2012-05-16 – 2012-05-17 (×2): 25 ug via ORAL
  Filled 2012-05-15 (×3): qty 1

## 2012-05-15 MED ORDER — HEMOSTATIC AGENTS (NO CHARGE) OPTIME
TOPICAL | Status: DC | PRN
  Administered 2012-05-15: 1 via TOPICAL

## 2012-05-15 MED ORDER — LORAZEPAM 0.5 MG PO TABS: 0.5000 mg | ORAL_TABLET | Freq: Three times a day (TID) | ORAL | Status: DC | PRN

## 2012-05-15 MED ORDER — ONDANSETRON HCL 4 MG/2ML IJ SOLN
INTRAMUSCULAR | Status: DC | PRN
  Administered 2012-05-15 (×2): 4 mg via INTRAVENOUS

## 2012-05-15 MED ORDER — BUPROPION HCL ER (XL) 300 MG PO TB24
300.0000 mg | ORAL_TABLET | Freq: Every evening | ORAL | Status: DC
  Administered 2012-05-15 – 2012-05-16 (×2): 300 mg via ORAL
  Filled 2012-05-15 (×3): qty 1

## 2012-05-15 MED ORDER — METHOCARBAMOL 500 MG PO TABS
500.0000 mg | ORAL_TABLET | Freq: Four times a day (QID) | ORAL | Status: DC | PRN
  Administered 2012-05-17: 500 mg via ORAL
  Filled 2012-05-15: qty 1

## 2012-05-15 MED ORDER — SODIUM CHLORIDE 0.9 % IJ SOLN
3.0000 mL | Freq: Two times a day (BID) | INTRAMUSCULAR | Status: DC
  Administered 2012-05-16 – 2012-05-17 (×2): 3 mL via INTRAVENOUS

## 2012-05-15 MED ORDER — OXYCODONE HCL 5 MG PO TABS: 5.0000 mg | ORAL_TABLET | Freq: Once | ORAL | Status: DC | PRN

## 2012-05-15 MED ORDER — POTASSIUM CHLORIDE IN NACL 20-0.9 MEQ/L-% IV SOLN
INTRAVENOUS | Status: DC
  Administered 2012-05-15: 21:00:00 via INTRAVENOUS
  Filled 2012-05-15 (×6): qty 1000

## 2012-05-15 MED ORDER — DIPHENHYDRAMINE HCL 25 MG PO CAPS: 25.0000 mg | ORAL_CAPSULE | Freq: Four times a day (QID) | ORAL | Status: DC | PRN

## 2012-05-15 MED ORDER — OXYCODONE-ACETAMINOPHEN 5-325 MG PO TABS
1.0000 | ORAL_TABLET | ORAL | Status: DC | PRN
Start: 2012-05-15 — End: 2012-05-17
  Administered 2012-05-15 (×2): 1 via ORAL
  Administered 2012-05-16 – 2012-05-17 (×5): 2 via ORAL
  Filled 2012-05-15 (×4): qty 2
  Filled 2012-05-15 (×2): qty 1
  Filled 2012-05-15 (×3): qty 2

## 2012-05-15 MED ORDER — METOCLOPRAMIDE HCL 5 MG/ML IJ SOLN
INTRAMUSCULAR | Status: DC | PRN
  Administered 2012-05-15: 10 mg via INTRAVENOUS

## 2012-05-15 MED ORDER — MIDAZOLAM HCL 5 MG/5ML IJ SOLN
INTRAMUSCULAR | Status: DC | PRN
  Administered 2012-05-15: 2 mg via INTRAVENOUS

## 2012-05-15 MED ORDER — BUPIVACAINE HCL (PF) 0.25 % IJ SOLN
INTRAMUSCULAR | Status: DC | PRN
  Administered 2012-05-15: 8 mL

## 2012-05-15 MED ORDER — PANTOPRAZOLE SODIUM 40 MG PO TBEC
40.0000 mg | DELAYED_RELEASE_TABLET | Freq: Every day | ORAL | Status: DC
  Administered 2012-05-15 – 2012-05-17 (×3): 40 mg via ORAL
  Filled 2012-05-15 (×2): qty 1

## 2012-05-15 MED ORDER — ACETAMINOPHEN 10 MG/ML IV SOLN
1000.0000 mg | Freq: Four times a day (QID) | INTRAVENOUS | Status: AC
  Administered 2012-05-16 (×2): 1000 mg via INTRAVENOUS
  Filled 2012-05-15 (×4): qty 100

## 2012-05-15 MED ORDER — ACETAMINOPHEN 325 MG PO TABS: 650.0000 mg | ORAL_TABLET | ORAL | Status: DC | PRN

## 2012-05-15 MED ORDER — CEFAZOLIN SODIUM 1-5 GM-% IV SOLN
INTRAVENOUS | Status: AC
  Filled 2012-05-15: qty 50

## 2012-05-15 SURGICAL SUPPLY — 79 items
9x9x23 Mas PLIF  Cage ×2 IMPLANT
ADH SKN CLS APL DERMABOND .7 (GAUZE/BANDAGES/DRESSINGS) ×1
APL SKNCLS STERI-STRIP NONHPOA (GAUZE/BANDAGES/DRESSINGS) ×1
BAG DECANTER FOR FLEXI CONT (MISCELLANEOUS) ×2 IMPLANT
BENZOIN TINCTURE PRP APPL 2/3 (GAUZE/BANDAGES/DRESSINGS) ×2 IMPLANT
BLADE SURG ROTATE 9660 (MISCELLANEOUS) IMPLANT
BONE MATRIX OSTEOCEL PLUS 5CC (Bone Implant) ×1 IMPLANT
BUR MATCHSTICK NEURO 3.0 LAGG (BURR) ×2 IMPLANT
CAGE COROENT PLIF 10X28-8 LUMB (Cage) ×2 IMPLANT
CANISTER SUCTION 2500CC (MISCELLANEOUS) ×2 IMPLANT
CLIP NEUROVISION LG (CLIP) ×1 IMPLANT
CLOTH BEACON ORANGE TIMEOUT ST (SAFETY) ×2 IMPLANT
CONT SPEC 4OZ CLIKSEAL STRL BL (MISCELLANEOUS) ×4 IMPLANT
COVER BACK TABLE 24X17X13 BIG (DRAPES) IMPLANT
COVER TABLE BACK 60X90 (DRAPES) ×2 IMPLANT
DERMABOND ADVANCED (GAUZE/BANDAGES/DRESSINGS) ×1
DERMABOND ADVANCED .7 DNX12 (GAUZE/BANDAGES/DRESSINGS) IMPLANT
DRAPE C-ARM 42X72 X-RAY (DRAPES) ×2 IMPLANT
DRAPE C-ARMOR (DRAPES) ×2 IMPLANT
DRAPE LAPAROTOMY 100X72X124 (DRAPES) ×2 IMPLANT
DRAPE POUCH INSTRU U-SHP 10X18 (DRAPES) ×2 IMPLANT
DRAPE SURG 17X23 STRL (DRAPES) ×2 IMPLANT
DRESSING TELFA 8X3 (GAUZE/BANDAGES/DRESSINGS) ×2 IMPLANT
DRSG OPSITE 4X5.5 SM (GAUZE/BANDAGES/DRESSINGS) ×3 IMPLANT
DURAPREP 26ML APPLICATOR (WOUND CARE) ×2 IMPLANT
ELECT REM PT RETURN 9FT ADLT (ELECTROSURGICAL) ×2
ELECTRODE REM PT RTRN 9FT ADLT (ELECTROSURGICAL) ×1 IMPLANT
EVACUATOR 1/8 PVC DRAIN (DRAIN) ×2 IMPLANT
GAUZE SPONGE 4X4 16PLY XRAY LF (GAUZE/BANDAGES/DRESSINGS) IMPLANT
GLOVE BIO SURGEON STRL SZ 6.5 (GLOVE) ×1 IMPLANT
GLOVE BIO SURGEON STRL SZ8 (GLOVE) ×4 IMPLANT
GLOVE BIOGEL PI IND STRL 6.5 (GLOVE) IMPLANT
GLOVE BIOGEL PI IND STRL 7.0 (GLOVE) IMPLANT
GLOVE BIOGEL PI IND STRL 7.5 (GLOVE) IMPLANT
GLOVE BIOGEL PI INDICATOR 6.5 (GLOVE) ×1
GLOVE BIOGEL PI INDICATOR 7.0 (GLOVE) ×2
GLOVE BIOGEL PI INDICATOR 7.5 (GLOVE) ×1
GLOVE ECLIPSE 7.5 STRL STRAW (GLOVE) ×2 IMPLANT
GLOVE SURG SS PI 7.0 STRL IVOR (GLOVE) ×5 IMPLANT
GOWN BRE IMP SLV AUR LG STRL (GOWN DISPOSABLE) IMPLANT
GOWN BRE IMP SLV AUR XL STRL (GOWN DISPOSABLE) ×4 IMPLANT
GOWN STRL REIN 2XL LVL4 (GOWN DISPOSABLE) IMPLANT
HEMOSTAT POWDER KIT SURGIFOAM (HEMOSTASIS) IMPLANT
KIT BASIN OR (CUSTOM PROCEDURE TRAY) ×2 IMPLANT
KIT NDL NVM5 EMG ELECT (KITS) IMPLANT
KIT NEEDLE NVM5 EMG ELECT (KITS) ×1 IMPLANT
KIT NEEDLE NVM5 EMG ELECTRODE (KITS) ×1
KIT ROOM TURNOVER OR (KITS) ×2 IMPLANT
MILL MEDIUM DISP (BLADE) IMPLANT
NDL HYPO 25X1 1.5 SAFETY (NEEDLE) ×1 IMPLANT
NEEDLE HYPO 25X1 1.5 SAFETY (NEEDLE) ×2 IMPLANT
NS IRRIG 1000ML POUR BTL (IV SOLUTION) ×2 IMPLANT
PACK FOAM VITOSS 5CC (Orthopedic Implant) ×1 IMPLANT
PACK LAMINECTOMY NEURO (CUSTOM PROCEDURE TRAY) ×2 IMPLANT
PAD ARMBOARD 7.5X6 YLW CONV (MISCELLANEOUS) ×6 IMPLANT
ROD 55MM (Rod) ×4 IMPLANT
ROD SPNL 55XPREBNT NS MAS (Rod) IMPLANT
SCREW 5.0X30 (Screw) ×1 IMPLANT
SCREW LOCK (Screw) ×12 IMPLANT
SCREW LOCK FXNS SPNE MAS PL (Screw) IMPLANT
SCREW PLIF MAS 5.5X35 LUMBAR (Screw) ×1 IMPLANT
SCREW SHANK 5.0X30MM (Screw) ×2 IMPLANT
SCREW SHANK 5.0X35 (Screw) ×2 IMPLANT
SCREW TULIP 5.5 (Screw) ×4 IMPLANT
SPONGE LAP 4X18 X RAY DECT (DISPOSABLE) IMPLANT
SPONGE SURGIFOAM ABS GEL 100 (HEMOSTASIS) ×2 IMPLANT
STRIP CLOSURE SKIN 1/2X4 (GAUZE/BANDAGES/DRESSINGS) ×3 IMPLANT
SUT VIC AB 0 CT1 18XCR BRD8 (SUTURE) ×1 IMPLANT
SUT VIC AB 0 CT1 8-18 (SUTURE) ×2
SUT VIC AB 2-0 CP2 18 (SUTURE) ×2 IMPLANT
SUT VIC AB 3-0 SH 8-18 (SUTURE) ×4 IMPLANT
SYR 20ML ECCENTRIC (SYRINGE) ×2 IMPLANT
SYR CONTROL 10ML LL (SYRINGE) ×1 IMPLANT
TAPE STRIPS DRAPE STRL (GAUZE/BANDAGES/DRESSINGS) ×1 IMPLANT
TOWEL OR 17X24 6PK STRL BLUE (TOWEL DISPOSABLE) ×2 IMPLANT
TOWEL OR 17X26 10 PK STRL BLUE (TOWEL DISPOSABLE) ×2 IMPLANT
TRAP SPECIMEN MUCOUS 40CC (MISCELLANEOUS) IMPLANT
TRAY FOLEY CATH 14FRSI W/METER (CATHETERS) ×2 IMPLANT
WATER STERILE IRR 1000ML POUR (IV SOLUTION) ×2 IMPLANT

## 2012-05-15 NOTE — Transfer of Care (Signed)
Immediate Anesthesia Transfer of Care Note  Patient: Melissa Leach  Procedure(s) Performed: Procedure(s): Lumbar four-five MAXIMUM ACCESS (MAS) POSTERIOR LUMBAR INTERBODY FUSION (PLIF)  (Bilateral)  Patient Location: PACU  Anesthesia Type:General  Level of Consciousness: awake, alert  and oriented  Airway & Oxygen Therapy: Patient Spontanous Breathing and Patient connected to nasal cannula oxygen  Post-op Assessment: Report given to PACU RN, Post -op Vital signs reviewed and stable and Patient moving all extremities  Post vital signs: Reviewed and stable  Complications: No apparent anesthesia complications

## 2012-05-15 NOTE — H&P (Signed)
Subjective: Patient is a 62 y.o. female admitted for PLIF. Onset of symptoms was many months ago, gradually worsening since that time.  The pain is rated severe, and is located at the across the lower back and radiates to RLE. The pain is described as aching and sharp and occurs all day. The symptoms have been progressive. Symptoms are exacerbated by exercise and standing. MRI or CT showed spondylolisthesis with stenosis L4-5 and L5-S1.   Past Medical History  Diagnosis Date  . Gastritis     HISTORY OF GASTRITIS--OCCAS ABDOMINAL PAIN   . H/O hiatal hernia   . Anxiety   . Complication of anesthesia     "SCRATCHED CORNEA"  WAKING UP FROM  KNEE SURGERY--'82  . Headache     MIGRAINE- INFREQUENT  . Blood in urine     "ALWAYS"  --AND STATES ALWAYS TREATED FOR UTI--BUT HAS NEVER HAD UTI.  Marland Kitchen Arthritis     SEVERE PAIN RIGHT HIP  . Hyperlipidemia     takes Lipitor daily  . History of migraine     takes Topamax daily and Imitrex prn  . Joint pain   . Joint swelling   . Chronic back pain   . GERD (gastroesophageal reflux disease)     takes Omeprazole daily and states only bc she takes Mobic  . Nocturia   . Depression     PT'S HUSBAND DIED OF CANCER AT [facility] 2012-02-04  . Insomnia     takes Xanax and Ativan prn  . Hypothyroidism 05/13/12    Past Surgical History  Procedure Laterality Date  . Joint replacement      RIGHT TOTAL KNEE REPLACEMENT AND RT TOTAL KNEE REVISION    AND  LEFT TOTAL KNEE REPLACEMENT  . Tubal ligation    . Tonsillectomy    . Total hip arthroplasty  06/25/2011    Procedure: TOTAL HIP ARTHROPLASTY ANTERIOR APPROACH;  Surgeon: [name], MD;  Location: [facility];  Service: Orthopedics;  Laterality: Left;  Marland Kitchen Eye surgery  1994    RA surgery-  . Total knee revision  10/28/2011    Procedure: TOTAL KNEE REVISION;  Surgeon: [name], MD;  Location: [facility];  Service: Orthopedics;  Laterality: Right;  . Hematoma evacuation  12/09/2011    Procedure: EVACUATION HEMATOMA;   Surgeon: [name], MD;  Location: [facility];  Service: Orthopedics;  Laterality: Right;  Evacuation of Hematoma Right Knee, I & D and Closed Manipulation of Right Knee  . Irrigation and debridement knee  12/09/2011    Procedure: IRRIGATION AND DEBRIDEMENT KNEE;  Surgeon: [name], MD;  Location: [facility];  Service: Orthopedics;  Laterality: Right;  . Knee closed reduction  12/09/2011    Procedure: CLOSED MANIPULATION KNEE;  Surgeon: [name], MD;  Location: [facility];  Service: Orthopedics;  Laterality: Right;  . Total hip arthroplasty  03/02/2012    Procedure: TOTAL HIP ARTHROPLASTY ANTERIOR APPROACH;  Surgeon: [name], MD;  Location: [facility];  Service: Orthopedics;  Laterality: Right;  . Colonoscopy    . Esophagogastroduodenoscopy    . Epidural injections      x 2 in the past 4weeks    Prior to Admission medications   Medication Sig Start Date End Date Taking? Authorizing Provider  atorvastatin (LIPITOR) 40 MG tablet Take 20 mg by mouth every evening. Takes 1/2 tablet   Yes Historical Provider, MD  buPROPion (WELLBUTRIN XL) 300 MG 24 hr tablet Take 300 mg by mouth every  evening.   Yes Historical Provider, MD  calcium-vitamin D (OSCAL WITH D) 500-200 MG-UNIT per tablet Take 2 tablets by mouth daily.    Yes Historical Provider, MD  levothyroxine (SYNTHROID, LEVOTHROID) 25 MCG tablet Take 25 mcg by mouth daily.   Yes Historical Provider, MD  LORazepam (ATIVAN) 0.5 MG tablet Take 0.5 mg by mouth every 8 (eight) hours as needed. For anxiety/sleep   Yes Historical Provider, MD  Multiple Vitamin (MULTIVITAMIN WITH MINERALS) TABS Take 1 tablet by mouth daily.   Yes Historical Provider, MD  omeprazole (PRILOSEC) 40 MG capsule Take 40 mg by mouth every evening.   Yes Historical Provider, MD  pregabalin (LYRICA) 75 MG capsule Take 75 mg by mouth 2 (two) times daily.   Yes Historical Provider, MD  SUMAtriptan (IMITREX) 100 MG tablet Take 100 mg by mouth every 2 (two) hours as needed. For  migraine   Yes Historical Provider, MD  topiramate (TOPAMAX) 50 MG tablet Take 100 mg by mouth every evening.   Yes Historical Provider, MD  ALPRAZolam Prudy Feeler) 0.5 MG tablet Take 0.25 mg by mouth at bedtime as needed. Takes 1/2 tablet if needed for sleep    Historical Provider, MD  diphenhydrAMINE (BENADRYL) 25 mg capsule Take 1 capsule (25 mg total) by mouth every 6 (six) hours as needed for itching, allergies or sleep. 03/04/12   Gerrit Halls, PA   Allergies  Allergen Reactions  . Sulfa Antibiotics Rash    History  Substance Use Topics  . Smoking status: Former Games developer  . Smokeless tobacco: Former Neurosurgeon    Quit date: 10/21/1976     Comment: QUIT SMOKING OVER 25 YRS AGO  . Alcohol Use: No    History reviewed. No pertinent family history.   Review of Systems  Positive ROS: neg  All other systems have been reviewed and were otherwise negative with the exception of those mentioned in the HPI and as above.  Objective: Vital signs in last 24 hours: Temp:  [98.5 F (36.9 C)] 98.5 F (36.9 C) (02/14 0942) Pulse Rate:  [81] 81 (02/14 0942) Resp:  [18] 18 (02/14 0942) BP: (100)/(69) 100/69 mmHg (02/14 0942) SpO2:  [99 %] 99 % (02/14 0942)  General Appearance: Alert, cooperative, no distress, appears stated age Head: Normocephalic, without obvious abnormality, atraumatic Eyes: PERRL, conjunctiva/corneas clear, EOM's intact     Neck: Supple, symmetrical Back: Symmetric, no curvature, ROM normal, no CVA tenderness Lungs: , respirations unlabored Heart: Regular rate and rhythm, S1 and S2 normal, no murmur, rub or gallop Abdomen: Soft, non-tender Extremities: Extremities normal, atraumatic, no cyanosis or edema Pulses: 2+ and symmetric all extremities Skin: Skin color, texture, turgor normal, no rashes or lesions  NEUROLOGIC:   Mental status: Alert and oriented x4,  no aphasia, good attention span, fund of knowledge, and memory Motor Exam - grossly normal except weakness L  evertors  Sensory Exam - grossly normal Reflexes: 1+ Coordination - grossly normal Gait - grossly normal Balance - grossly normal Cranial Nerves: I: smell Not tested  II: visual acuity  OS: nl    OD: nl  II: visual fields Full to confrontation  II: pupils Equal, round, reactive to light  III,VII: ptosis None  III,IV,VI: extraocular muscles  Full ROM  V: mastication Normal  V: facial light touch sensation  Normal  V,VII: corneal reflex  Present  VII: facial muscle function - upper  Normal  VII: facial muscle function - lower Normal  VIII: hearing Not tested  IX: soft  palate elevation  Normal  IX,X: gag reflex Present  XI: trapezius strength  5/5  XI: sternocleidomastoid strength 5/5  XI: neck flexion strength  5/5  XII: tongue strength  Normal    Data Review Lab Results  Component Value Date   WBC 9.4 05/08/2012   HGB 13.4 05/08/2012   HCT 41.5 05/08/2012   MCV 95.6 05/08/2012   PLT 289 05/08/2012   Lab Results  Component Value Date   NA 139 05/08/2012   K 4.1 05/08/2012   CL 100 05/08/2012   CO2 27 05/08/2012   BUN 22 05/08/2012   CREATININE 0.87 05/08/2012   GLUCOSE 85 05/08/2012   Lab Results  Component Value Date   INR 0.99 05/08/2012    Assessment/Plan: Patient admitted for PLIF L4-5, L5-S1. Patient has failed conservative therapy.  I explained the condition and procedure to the patient and answered any questions.  Patient wishes to proceed with procedure as planned. Understands risks/ benefits and typical outcomes of procedure.   JONES,DAVID S 05/15/2012 10:21 AM

## 2012-05-15 NOTE — Preoperative (Signed)
Beta Blockers   Reason not to administer Beta Blockers:Not Applicable 

## 2012-05-15 NOTE — Anesthesia Procedure Notes (Signed)
Procedure Name: Intubation Date/Time: 05/15/2012 11:07 AM Performed by: Wray Kearns A Pre-anesthesia Checklist: Patient identified, Timeout performed, Emergency Drugs available, Suction available and Patient being monitored Patient Re-evaluated:Patient Re-evaluated prior to inductionOxygen Delivery Method: Circle system utilized Preoxygenation: Pre-oxygenation with 100% oxygen Intubation Type: IV induction and Cricoid Pressure applied Ventilation: Mask ventilation without difficulty Laryngoscope Size: Mac and 4 Grade View: Grade I Tube type: Oral Tube size: 8.0 mm Number of attempts: 1 Airway Equipment and Method: Stylet Placement Confirmation: ETT inserted through vocal cords under direct vision,  breath sounds checked- equal and bilateral and positive ETCO2 Secured at: 21 cm Tube secured with: Tape Dental Injury: Teeth and Oropharynx as per pre-operative assessment

## 2012-05-15 NOTE — Anesthesia Preprocedure Evaluation (Signed)
Anesthesia Evaluation  Patient identified by MRN, date of birth, ID band Patient awake    Reviewed: Allergy & Precautions, H&P , NPO status , Patient's Chart, lab work & pertinent test results  Airway Mallampati: I TM Distance: >3 FB Neck ROM: Full    Dental   Pulmonary          Cardiovascular     Neuro/Psych Anxiety Depression    GI/Hepatic GERD-  Controlled and Medicated,  Endo/Other    Renal/GU      Musculoskeletal   Abdominal   Peds  Hematology   Anesthesia Other Findings   Reproductive/Obstetrics                           Anesthesia Physical Anesthesia Plan  ASA: II  Anesthesia Plan: General   Post-op Pain Management:    Induction: Intravenous  Airway Management Planned: Oral ETT  Additional Equipment:   Intra-op Plan:   Post-operative Plan: Extubation in OR  Informed Consent: I have reviewed the patients History and Physical, chart, labs and discussed the procedure including the risks, benefits and alternatives for the proposed anesthesia with the patient or authorized representative who has indicated his/her understanding and acceptance.     Plan Discussed with: CRNA and Surgeon  Anesthesia Plan Comments:         Anesthesia Quick Evaluation

## 2012-05-15 NOTE — Op Note (Signed)
05/15/2012  3:42 PM  PATIENT:  Melissa Leach  62 y.o. female  PRE-OPERATIVE DIAGNOSIS:  Spondylolisthesis with severe central and foraminal spinal stenosis L4-5 and L5-S1 with back and leg pain  POST-OPERATIVE DIAGNOSIS:  Same  PROCEDURE:   1. Decompressive lumbar laminectomy L4-5 L5-S1 to decompress the L4, L5 and S1 nerve roots bilaterally requiring more work than would be required of the typical PLIF exposure in order to adequately decompress the neural elements.  2. Posterior lumbar interbody fusion  L4-5 L5-S1 using PEEK interbody cages packed with morcellized allograft and autograft and an allograft wedge.  3. Posterior fixation L4-5 L5-S1 using cortical pedicle screws.   SURGEON:  Marikay Alar, MD  ASSISTANTS: Dr. Phoebe Perch  ANESTHESIA:  General  EBL: 800 ml  Total I/O In: 3700 [I.V.:3400; Blood:300] Out: 1600 [Urine:800; Blood:800]  BLOOD ADMINISTERED:300 CC CELLSAVER  DRAINS: Hemovac   INDICATION FOR PROCEDURE: This patient presented with a long history of back pain with leg pain. She had severe right leg pain but a footdrop on the left. She had an MRI which showed spondylolisthesis L4 from L5-S1 with severe central and foraminal stenosis. Dynamic plain films showed instability. Recommended a 2 level decompression and fusion. Patient understood the risks, benefits, and alternatives and potential outcomes and wished to proceed.  PROCEDURE DETAILS:  The patient was brought to the operating room. After induction of generalized endotracheal anesthesia the patient was rolled into the prone position on chest rolls and all pressure points were padded. The patient's lumbar region was cleaned and then prepped with DuraPrep and draped in the usual sterile fashion. Anesthesia was injected and then a dorsal midline incision was made and carried down to the lumbosacral fascia. The fascia was opened and the paraspinous musculature was taken down in a subperiosteal fashion to expose L4-5  and L5-S1. Intraoperative fluoroscopy confirmed my level, and I started with placement of the L4 and L5 cortical pedicle screws. The pedicle screw entry zones were localized utilizing surface landmarks and AP and lateral fluoroscopy. I drilled and then tapped in an upper and outward direction into the pedicle. Then placed 50 by 35 mm pedicle screws at L4 and L5. I then turned my attention to the decompression and the spinous process was removed and complete lumbar laminectomies, hemi- facetectomies, and foraminotomies were performed at L4-5 and L5-S1. She had both central and foraminal stenosis of the L4, L5, and S1 nerve roots on EDP decompressed. The yellow ligament was removed to expose the underlying dura and nerve roots, and generous foraminotomies were performed to adequately decompress the neural elements. There was a synovial cyst at the pedicle level at L4-5 on the right that was removed . This caused severe compression of the right L5 nerve root. Once the decompression was complete, I turned my attention to the posterior lower lumbar interbody fusion. The epidural venous vasculature was coagulated and cut sharply. Disc space was incised at each level and the initial discectomy was performed with pituitary rongeurs. The disc space was distracted with sequential distractors to a height of 10 mm. We then used a series of scrapers and shavers to prepare the endplates for fusion. The midline was prepared with Epstein curettes. Once the complete discectomy was finished, we packed an appropriate sized peek interbody cage with local autograft and morcellized allograft, gently retracted the nerve root, and tapped the cage into position at L4-5 and L5-S1 bilaterally. The midline was packed with morselized autograft and allograft. We then turned our attention to the  posterior fixation at S1. The pedicle screw entry zones were identified utilizing surface landmarks and fluoroscopy. We probed each pedicle with the  pedicle probe and tapped each pedicle with the appropriate tap. We palpated with a ball probe to assure no break in the cortex. We then placed 5 5 x 35 mm pedicle screws into the pedicles bilaterally at S1.  We then placed lordotic rods into the multiaxial screw heads of the pedicle screws and locked these in position with the locking caps and anti-torque device. We then checked our construct with AP and lateral fluoroscopy. Irrigated with copious amounts of bacitracin-containing saline solution. Placed a medium Hemovac drain through separate stab incision. Inspected the nerve roots once again to assure adequate decompression, lined to the dura with Gelfoam, and closed the muscle and the fascia with 0 Vicryl. Closed the subcutaneous tissues with 2-0 Vicryl and subcuticular tissues with 3-0 Vicryl. The skin was closed with benzoin and Steri-Strips. Dressing was then applied, the patient was awakened from general anesthesia and transported to the recovery room in stable condition. At the end of the procedure all sponge, needle and instrument counts were correct.   PLAN OF CARE: Admit to inpatient   PATIENT DISPOSITION:  PACU - hemodynamically stable.   Delay start of Pharmacological VTE agent (>24hrs) due to surgical blood loss or risk of bleeding:  yes

## 2012-05-15 NOTE — Anesthesia Postprocedure Evaluation (Signed)
Anesthesia Post Note  Patient: Melissa Leach  Procedure(s) Performed: Procedure(s) (LRB): Lumbar four-five MAXIMUM ACCESS (MAS) POSTERIOR LUMBAR INTERBODY FUSION (PLIF)  (Bilateral)  Anesthesia type: general  Patient location: PACU  Post pain: Pain level controlled  Post assessment: Patient's Cardiovascular Status Stable  Last Vitals:  Filed Vitals:   05/15/12 1608  BP: 83/57  Pulse: 99  Temp:   Resp: 21    Post vital signs: Reviewed and stable  Level of consciousness: sedated  Complications: No apparent anesthesia complications

## 2012-05-16 NOTE — Evaluation (Signed)
Physical Therapy Evaluation Patient Details Name: Melissa Leach MRN: [id number] DOB: 04-Dec-1950 Today's Date: 05/16/2012 Time: 4098-1191 PT Time Calculation (min): 32 min  PT Assessment / Plan / Recommendation Clinical Impression  Pt 62 yo female s/p lumbar PLIF x 2 levels mobilizing well. Pt requiries constant v/c's to adhere to back precautions however anticipate with be safe to d/c home with assist of family and RW. Pt with handicapped accessible home good support and all DME.    PT Assessment  Patient needs continued PT services    Follow Up Recommendations  No PT follow up;Supervision/Assistance - 24 hour    Does the patient have the potential to tolerate intense rehabilitation      Barriers to Discharge None      Equipment Recommendations  None recommended by PT (pt has all equip)    Recommendations for Other Services     Frequency Min 5X/week    Precautions / Restrictions Precautions Precautions: Back Precaution Booklet Issued: Yes (comment) Precaution Comments: pt required freq v/c's t/o session to adhere to precautions Required Braces or Orthoses: Spinal Brace Spinal Brace: Lumbar corset;Applied in sitting position Restrictions Weight Bearing Restrictions: No   Pertinent Vitals/Pain "it not bad" when asked about pain      Mobility  Bed Mobility Bed Mobility: Rolling Right;Right Sidelying to Sit;Sitting - Scoot to Edge of Bed Rolling Right: 5: Supervision Right Sidelying to Sit: 4: Min guard;HOB flat Sitting - Scoot to Edge of Bed: 5: Supervision Details for Bed Mobility Assistance: VCs for proper technique Transfers Sit to Stand: 5: Supervision;With upper extremity assist;From bed Stand to Sit: 5: Supervision;With armrests;With upper extremity assist;To chair/3-in-1 Details for Transfer Assistance: VCs for safe hand placement Ambulation/Gait Ambulation/Gait Assistance: 4: Min assist;4: Min guard Ambulation Distance (Feet): 100 Feet Assistive device:  Rolling walker Ambulation/Gait Assistance Details: attempted to amb without RW however pt unsteady and pt prefered to use walker Gait Pattern: Step-to pattern;Wide base of support Gait velocity: wfl General Gait Details: pt with increased stabilty with RW Stairs: No    Exercises     PT Diagnosis: Difficulty walking  PT Problem List: Decreased activity tolerance;Decreased balance;Decreased mobility;Decreased knowledge of precautions PT Treatment Interventions: Gait training;Stair training;Functional mobility training;Therapeutic activities;Therapeutic exercise   PT Goals Acute Rehab PT Goals PT Goal Formulation: With patient Time For Goal Achievement: 05/23/12 Potential to Achieve Goals: Good Pt will Roll Supine to Right Side: with modified independence PT Goal: Rolling Supine to Right Side - Progress: Goal set today Pt will go Supine/Side to Sit: with modified independence;with HOB 0 degrees PT Goal: Supine/Side to Sit - Progress: Goal set today Pt will go Sit to Supine/Side: with modified independence;with HOB 0 degrees PT Goal: Sit to Supine/Side - Progress: Goal set today Pt will go Sit to Stand: with modified independence;with upper extremity assist (up to RW) PT Goal: Sit to Stand - Progress: Goal set today Pt will go Stand to Sit: with modified independence;with upper extremity assist PT Goal: Stand to Sit - Progress: Goal set today Pt will Ambulate: >150 feet;with modified independence;with rolling walker PT Goal: Ambulate - Progress: Goal set today Pt will Go Up / Down Stairs: with supervision;1-2 stairs;with rail(s) PT Goal: Up/Down Stairs - Progress: Goal set today Additional Goals Additional Goal #1: Pt independent with recall of back precautions and 100% compliance PT Goal: Additional Goal #1 - Progress: Goal set today  Visit Information  Last PT Received On: 05/16/12 Assistance Needed: +1 PT/OT Co-Evaluation/Treatment: Yes    Subjective Data  Subjective: Pt  received supine in bed agreeable to therapy. Patient Stated Goal: home   Prior Functioning  Home Living Lives With: Alone Available Help at Discharge: Family;Available 24 hours/day Type of Home: House Home Access: Stairs to enter [employer] of Steps: 1 Entrance Stairs-Rails: Right Home Layout: One level Bathroom Shower/Tub: Health visitor: Handicapped height Bathroom Accessibility: Yes How Accessible: Accessible via walker Home Adaptive Equipment: Hand-held shower hose;Grab bars in shower;Grab bars around toilet;Walker - rolling;Reacher;Long-handled sponge;Long-handled shoehorn Prior Function Level of Independence: Independent Able to Take Stairs?: Yes Driving: Yes Vocation: On disability Comments: pt a Runner, 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 Communication Communication: No difficulties Dominant Hand: Right    Cognition  Cognition Overall Cognitive Status: Impaired Area of Impairment: Memory Arousal/Alertness: Awake/alert Orientation Level: Appears intact for tasks assessed Behavior During Session: [facility] for tasks performed Memory: Decreased recall of precautions    Extremity/Trunk Assessment Right Upper Extremity Assessment RUE ROM/Strength/Tone: Within functional levels Left Upper Extremity Assessment LUE ROM/Strength/Tone: Within functional levels Right Lower Extremity Assessment RLE ROM/Strength/Tone: Within functional levels Left Lower Extremity Assessment LLE ROM/Strength/Tone: Within functional levels Trunk Assessment Trunk Assessment: Normal   Balance    End of Session PT - End of Session Equipment Utilized During Treatment: Gait belt;Back brace Activity Tolerance: Patient tolerated treatment well Patient left: in chair;with call bell/phone within reach;with family/visitor present Nurse Communication: Mobility status  GP     Marcene Brawn 05/16/2012, 11:32 AM   Lewis Shock, PT, DPT Pager #:  [phone] Office #: [phone]

## 2012-05-16 NOTE — Evaluation (Signed)
Occupational Therapy Evaluation Patient Details Name: Melissa Leach MRN: [id number] DOB: Jun 30, 1950 Today's Date: 05/16/2012 Time: 4098-1191 OT Time Calculation (min): 33 min  OT Assessment / Plan / Recommendation Clinical Impression  This 62 yo s/p back fusion surgery presents to acute OT with problems below. Will benefit from acute OT without need for follow up.    OT Assessment  Patient needs continued OT Services    Follow Up Recommendations  No OT follow up    Barriers to Discharge None    Equipment Recommendations  None recommended by OT       Frequency  Min 2X/week    Precautions / Restrictions Precautions Precautions: Back Precaution Booklet Issued: Yes (comment) Precaution Comments: pt required freq v/c's t/o session to adhere to precautions Required Braces or Orthoses: Spinal Brace Spinal Brace: Lumbar corset;Applied in sitting position Restrictions Weight Bearing Restrictions: No   Pertinent Vitals/Pain "Doesn't really hurt"    ADL  Eating/Feeding: Simulated;Independent Where Assessed - Eating/Feeding: Chair Grooming: Performed;Supervision/safety Where Assessed - Grooming: Unsupported standing Upper Body Bathing: Supervision/safety Where Assessed - Upper Body Bathing: Unsupported sitting Lower Body Bathing: Simulated;Maximal assistance Where Assessed - Lower Body Bathing: Unsupported sit to stand Upper Body Dressing: Simulated;Supervision/safety Where Assessed - Upper Body Dressing: Unsupported sitting Lower Body Dressing: Simulated;+1 Total assistance Where Assessed - Lower Body Dressing: Unsupported sit to stand Toilet Transfer: Performed;Supervision/safety Toilet Transfer Method: Sit to stand Toilet Transfer Equipment: Grab bars;Comfort height toilet Toileting - Clothing Manipulation and Hygiene: Performed;Modified independent (front peri, recommended wet wipes for back peri) Where Assessed - Toileting Clothing Manipulation and Hygiene: Sit to  stand from 3-in-1 or toilet Equipment Used: Back brace;Rolling walker Transfers/Ambulation Related to ADLs: S for sit to stand and stand to sit, min guard A for ambulation with RW ADL Comments: Cannot cross legs to get to feet    OT Diagnosis: Generalized weakness  OT Problem List: Decreased knowledge of precautions;Decreased knowledge of use of DME or AE OT Treatment Interventions: Self-care/ADL training;Patient/family education;Balance training   OT Goals Acute Rehab OT Goals OT Goal Formulation: With patient Time For Goal Achievement: 05/23/12 Potential to Achieve Goals: Good ADL Goals Pt Will Perform Lower Body Dressing: with supervision;with adaptive equipment;Unsupported ADL Goal: Lower Body Dressing - Progress: Goal set today Pt Will Transfer to Toilet: with supervision;with DME;Comfort height toilet ADL Goal: Toilet Transfer - Progress: Goal set today Pt Will Perform Toileting - Clothing Manipulation: with modified independence;Standing ADL Goal: Toileting - Clothing Manipulation - Progress: Goal set today Pt Will Perform Toileting - Hygiene: with modified independence;Sit to stand from 3-in-1/toilet ADL Goal: Toileting - Hygiene - Progress: Goal set today Pt Will Perform Tub/Shower Transfer: Shower transfer;with supervision;Ambulation;Grab bars (built in shower seay) ADL Goal: Tub/Shower Transfer - Progress: Goal set today Miscellaneous OT Goals Miscellaneous OT Goal #1: Pt will be able to state and follow back precautions without cues OT Goal: Miscellaneous Goal #1 - Progress: Goal set today Miscellaneous OT Goal #2: Pt will be Mod I in and OOB for BADLs OT Goal: Miscellaneous Goal #2 - Progress: Goal set today  Visit Information  Last OT Received On: 05/16/12 Assistance Needed: +1 PT/OT Co-Evaluation/Treatment: Yes    Subjective Data  Subjective: I will have help for about 2 weeks   Prior Functioning     Home Living Lives With: Alone Available Help at  Discharge: Family;Available 24 hours/day Type of Home: House Home Access: Stairs to enter [employer] of Steps: 1 Entrance Stairs-Rails: Right Home Layout: One level Bathroom  Shower/Tub: Health visitor: Handicapped height Bathroom Accessibility: Yes How Accessible: Accessible via walker Home Adaptive Equipment: Hand-held shower hose;Grab bars in shower;Grab bars around toilet;Walker - rolling;Reacher;Long-handled sponge;Long-handled shoehorn Prior Function Level of Independence: Independent Able to Take Stairs?: Yes Driving: Yes Vocation: On disability Comments: pt a Runner, 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 for tasks performed Memory: Decreased recall of precautions    Extremity/Trunk Assessment Right Upper Extremity Assessment RUE ROM/Strength/Tone: Within functional levels Left Upper Extremity Assessment LUE ROM/Strength/Tone: Within functional levels     Mobility Bed Mobility Bed Mobility: Rolling Right;Right Sidelying to Sit;Sitting - Scoot to Edge of Bed Rolling Right: 5: Supervision Right Sidelying to Sit: 4: Min guard;HOB flat Sitting - Scoot to Edge of Bed: 5: Supervision Details for Bed Mobility Assistance: VCs for proper technique Transfers Transfers: Sit to Stand;Stand to Sit Sit to Stand: 5: Supervision;With upper extremity assist;From bed Stand to Sit: 5: Supervision;With armrests;With upper extremity assist;To chair/3-in-1 Details for Transfer Assistance: VCs for safe hand placement           End of Session OT - End of Session Equipment Utilized During Treatment: Back brace (RW) Activity Tolerance: Patient tolerated treatment  well Patient left: in chair;with call bell/phone within reach;with family/visitor present    Evette Georges [phone] 05/16/2012, 11:09 AM

## 2012-05-16 NOTE — Progress Notes (Signed)
Foley cath d/c'd at 0630 without any difficulties. Tolerated well.

## 2012-05-16 NOTE — Plan of Care (Signed)
Problem: Consults Goal: Diagnosis - Spinal Surgery Thoraco/Lumbar Spine Fusion     

## 2012-05-16 NOTE — Progress Notes (Signed)
Patient ID: Melissa Leach, female   DOB: Jan 30, 1951, 62 y.o.   MRN: [id number] Patient is doing well. She is not complaining of much in the way of back soreness. Describes some tingling in the feet but no pain in the legs. She looks quite good. She has good strength in her lower extremities. She has yet to mobilize. Out of bed today with therapy. Hopefully home tomorrow.

## 2012-05-17 MED ORDER — CYCLOBENZAPRINE HCL 10 MG PO TABS: 10.0000 mg | ORAL_TABLET | Freq: Three times a day (TID) | ORAL | Status: DC | PRN

## 2012-05-17 MED ORDER — OXYCODONE-ACETAMINOPHEN 5-325 MG PO TABS: 1.0000 | ORAL_TABLET | ORAL | Status: DC | PRN

## 2012-05-17 NOTE — Progress Notes (Signed)
Occupational Therapy Treatment Patient Details Name: Melissa Leach MRN: [id number] DOB: 1950/11/23 Today's Date: 05/17/2012 Time: 4098-1191 OT Time Calculation (min): 68 min  OT Assessment / Plan / Recommendation Comments on Treatment Session Pt. doing well with use of AE for LE ADL's. Pt. able to perform tasks at S level. Pt. had many question for home management tasks and discussed ways pt. could perform tasks wtih back safety. Pt. has dog and discussed how to put on leash on dog to avoid bending.     Follow Up Recommendations       Barriers to Discharge       Equipment Recommendations       Recommendations for Other Services    Frequency     Plan Discharge plan remains appropriate    Precautions / Restrictions Precautions Precautions: Back Precaution Booklet Issued: Yes (comment) Precaution Comments:  (Pt. ed. on back precautions and verbalalized understanding.) Spinal Brace: Lumbar corset   Pertinent Vitals/Pain     ADL  Grooming: Performed;Wash/dry hands;Wash/dry face;Teeth care;Supervision/safety Where Assessed - Grooming: Unsupported standing Upper Body Bathing: Simulated;Supervision/safety Where Assessed - Upper Body Bathing: Unsupported standing Lower Body Bathing: Simulated;Supervision/safety Where Assessed - Lower Body Bathing: Unsupported standing Lower Body Dressing: Performed;Supervision/safety Where Assessed - Lower Body Dressing: Unsupported standing Toilet Transfer: Performed;Supervision/safety;+1 Total assistance Toilet Transfer Method: Stand pivot Toilet Transfer Equipment: Comfort height toilet;Grab bars Toileting - Clothing Manipulation and Hygiene: Performed;Supervision/safety Where Assessed - Toileting Clothing Manipulation and Hygiene: Sit to stand from 3-in-1 or toilet Transfers/Ambulation Related to ADLs:  (Pt. S with AMB with walking with RW. Pt. requires cues for p) ADL Comments: Pt. requires v.c.ing for proper hand placement. Pt. ed. on AE  for ADL's. Pt. ed. for performing home management tasks.     OT Diagnosis:    OT Problem List:   OT Treatment Interventions:     OT Goals ADL Goals Pt Will Perform Lower Body Dressing: with supervision ADL Goal: Lower Body Dressing - Progress: Met Pt Will Transfer to Toilet: with supervision ADL Goal: Toilet Transfer - Progress: Met Pt Will Perform Toileting - Clothing Manipulation: with supervision ADL Goal: Toileting - Clothing Manipulation - Progress: Met Pt Will Perform Toileting - Hygiene: with supervision ADL Goal: Toileting - Hygiene - Progress: Met  Visit Information  Last OT Received On: 05/17/12 Assistance Needed: +1    Subjective Data      Prior Functioning       Cognition  Cognition Overall Cognitive Status: Appears within functional limits for tasks assessed/performed    Mobility  Transfers Sit to Stand: 5: Supervision;From chair/3-in-1;From toilet Stand to Sit: 5: Supervision;To chair/3-in-1;To toilet    Exercises      Balance     End of Session OT - End of Session Equipment Utilized During Treatment: Gait belt Activity Tolerance: Patient tolerated treatment well Patient left: in chair;with call bell/phone within reach  GO     Hercules Hasler 05/17/2012, 10:58 AM

## 2012-05-17 NOTE — Progress Notes (Signed)
Physical Therapy Treatment Patient Details Name: Xareni Kelch MRN: [id number] DOB: 01-26-1951 Today's Date: 05/17/2012 Time: 1204-1221 PT Time Calculation (min): 17 min  PT Assessment / Plan / Recommendation Comments on Treatment Session  Pt moving well.  Pt with question Re: car transfers.  Educated & demonstrated technique.      Follow Up Recommendations  No PT follow up;Supervision/Assistance - 24 hour     Does the patient have the potential to tolerate intense rehabilitation     Barriers to Discharge        Equipment Recommendations  None recommended by PT    Recommendations for Other Services    Frequency Min 5X/week   Plan Discharge plan remains appropriate    Precautions / Restrictions Precautions Precautions: Back Precaution Booklet Issued: Yes (comment) Precaution Comments: Pt able to recall 3/3 back precautions but required ocassional cueing to reinforce with functional mobility Required Braces or Orthoses: Spinal Brace Spinal Brace: Lumbar corset;Applied in sitting position       Mobility  Bed Mobility Bed Mobility: Not assessed Transfers Transfers: Sit to Stand;Stand to Sit Sit to Stand: 6: Modified independent (Device/Increase time);With upper extremity assist;With armrests;From chair/3-in-1 Stand to Sit: With upper extremity assist;With armrests;To chair/3-in-1;6: Modified independent (Device/Increase time) Ambulation/Gait Ambulation/Gait Assistance: 5: Supervision;4: Min guard Ambulation Distance (Feet): 150 Feet Assistive device: Rolling walker;None Ambulation/Gait Assistance Details: Trialed ambulation with no AD-- Pt with guarded & slow gait but no unsteadiness noted.  Pt reports she feels more comfortable with continuing to use RW at this time to ensure safety.   Gait Pattern: Step-through pattern;Decreased stride length      PT Goals Acute Rehab PT Goals Time For Goal Achievement: 05/23/12 Potential to Achieve Goals: Good Pt will Roll  Supine to Right Side: with modified independence Pt will go Supine/Side to Sit: with modified independence;with HOB 0 degrees Pt will go Sit to Supine/Side: with modified independence;with HOB 0 degrees Pt will go Sit to Stand: with modified independence;with upper extremity assist PT Goal: Sit to Stand - Progress: Met Pt will go Stand to Sit: with modified independence;with upper extremity assist PT Goal: Stand to Sit - Progress: Met Pt will Ambulate: >150 feet;with modified independence;with rolling walker PT Goal: Ambulate - Progress: Progressing toward goal Pt will Go Up / Down Stairs: with supervision;1-2 stairs;with rail(s) Additional Goals Additional Goal #1: Pt independent with recall of back precautions and 100% compliance PT Goal: Additional Goal #1 - Progress: Progressing toward goal  Visit Information  Last PT Received On: 05/17/12 Assistance Needed: +1    Subjective Data      Cognition  Cognition Overall Cognitive Status: Appears within functional limits for tasks assessed/performed Arousal/Alertness: Awake/alert Orientation Level: Appears intact for tasks assessed Behavior During Session: [facility] for tasks performed    Balance     End of Session PT - End of Session Equipment Utilized During Treatment: Gait belt;Back brace Activity Tolerance: Patient tolerated treatment well Patient left: in chair;with call bell/phone within reach;with family/visitor present Nurse Communication: Mobility status     Verdell Face, [place] [phone] 05/17/2012

## 2012-05-17 NOTE — Progress Notes (Signed)
Utilization Review Completed.Thorsten Climer T2/16/2014  

## 2012-05-17 NOTE — Discharge Summary (Signed)
Physician Discharge Summary  Patient ID: Melissa Leach MRN: [id number] DOB/AGE: 61/16/1952 61 y.o.  Admit date: 05/15/2012 Discharge date: 05/17/2012  Admission Diagnoses:Spondylolisthesis with severe central and foraminal spinal stenosis L4-5 and L5-S1 with back and leg pain   Discharge Diagnoses: Spondylolisthesis with severe central and foraminal spinal stenosis L4-5 and L5-S1 with back and leg pain  Active Problems:   * No active hospital problems. *   Discharged Condition: good  Hospital Course: pt admitted on day of surgery  - underwent procedure below - pt doing well, minimal pain, ambulating, voiding, eating  Consults: none  Significant Diagnostic Studies: none  Treatments: surgery: PROCEDURE:  1. Decompressive lumbar laminectomy L4-5 L5-S1 to decompress the L4, L5 and S1 nerve roots bilaterally requiring more work than would be required of the typical PLIF exposure in order to adequately decompress the neural elements.  2. Posterior lumbar interbody fusion L4-5 L5-S1 using PEEK interbody cages packed with morcellized allograft and autograft and an allograft wedge.  3. Posterior fixation L4-5 L5-S1 using cortical pedicle screws.      Discharge Exam: Blood pressure 99/51, pulse 85, temperature 97.7 F (36.5 C), temperature source Oral, resp. rate 20, height 5\' 1"  (1.549 m), weight 80.74 kg (178 lb), SpO2 99.00%. Wound:c/d/i  Disposition: home     Medication List    STOP taking these medications       meloxicam 15 MG tablet  Commonly known as:  MOBIC      TAKE these medications       ALPRAZolam 0.5 MG tablet  Commonly known as:  XANAX  Take 0.25 mg by mouth at bedtime as needed. Takes 1/2 tablet if needed for sleep     atorvastatin 40 MG tablet  Commonly known as:  LIPITOR  Take 20 mg by mouth every evening. Takes 1/2 tablet     buPROPion 300 MG 24 hr tablet  Commonly known as:  WELLBUTRIN XL  Take 300 mg by mouth every evening.     calcium-vitamin D 500-200 MG-UNIT per tablet  Commonly known as:  OSCAL WITH D  Take 2 tablets by mouth daily.     cyclobenzaprine 10 MG tablet  Commonly known as:  FLEXERIL  Take 1 tablet (10 mg total) by mouth 3 (three) times daily as needed for muscle spasms.     diphenhydrAMINE 25 mg capsule  Commonly known as:  BENADRYL  Take 1 capsule (25 mg total) by mouth every 6 (six) hours as needed for itching, allergies or sleep.     levothyroxine 25 MCG tablet  Commonly known as:  SYNTHROID, LEVOTHROID  Take 25 mcg by mouth daily.     LORazepam 0.5 MG tablet  Commonly known as:  ATIVAN  Take 0.5 mg by mouth every 8 (eight) hours as needed. For anxiety/sleep     multivitamin with minerals Tabs  Take 1 tablet by mouth daily.     omeprazole 40 MG capsule  Commonly known as:  PRILOSEC  Take 40 mg by mouth every evening.     oxyCODONE-acetaminophen 5-325 MG per tablet  Commonly known as:  PERCOCET/ROXICET  Take 1-2 tablets by mouth every 4 (four) hours as needed for pain.     pregabalin 75 MG capsule  Commonly known as:  LYRICA  Take 75 mg by mouth 2 (two) times daily.     SUMAtriptan 100 MG tablet  Commonly known as:  IMITREX  Take 100 mg by mouth every 2 (two) hours as needed. For migraine  topiramate 50 MG tablet  Commonly known as:  TOPAMAX  Take 100 mg by mouth every evening.         SignedClydene Fake, MD 05/17/2012, 7:44 AM

## 2012-05-19 MED FILL — Sodium Chloride IV Soln 0.9%: INTRAVENOUS | Qty: 2000 | Status: AC

## 2012-05-19 MED FILL — Heparin Sodium (Porcine) Inj 1000 Unit/ML: INTRAMUSCULAR | Qty: 30 | Status: AC

## 2012-07-04 ENCOUNTER — Inpatient Hospital Stay (HOSPITAL_COMMUNITY)
Admission: EM | Admit: 2012-07-04 | Discharge: 2012-07-09 | DRG: 863 | Disposition: A | Discharge status: Home / Self Care | Payer: 59 | Attending: Neurological Surgery | Admitting: Neurological Surgery

## 2012-07-04 ENCOUNTER — Encounter (HOSPITAL_COMMUNITY): Payer: Self-pay | Admitting: *Deleted

## 2012-07-04 ENCOUNTER — Inpatient Hospital Stay (HOSPITAL_COMMUNITY): Payer: 59

## 2012-07-04 DIAGNOSIS — T8140XA Infection following a procedure, unspecified, initial encounter: Principal | ICD-10-CM | POA: Diagnosis present

## 2012-07-04 DIAGNOSIS — Y832 Surgical operation with anastomosis, bypass or graft as the cause of abnormal reaction of the patient, or of later complication, without mention of misadventure at the time of the procedure: Secondary | ICD-10-CM | POA: Diagnosis present

## 2012-07-04 DIAGNOSIS — M129 Arthropathy, unspecified: Secondary | ICD-10-CM | POA: Diagnosis present

## 2012-07-04 DIAGNOSIS — K219 Gastro-esophageal reflux disease without esophagitis: Secondary | ICD-10-CM | POA: Diagnosis present

## 2012-07-04 DIAGNOSIS — E785 Hyperlipidemia, unspecified: Secondary | ICD-10-CM | POA: Diagnosis present

## 2012-07-04 DIAGNOSIS — G4700 Insomnia, unspecified: Secondary | ICD-10-CM | POA: Diagnosis present

## 2012-07-04 DIAGNOSIS — M4636 Infection of intervertebral disc (pyogenic), lumbar region: Secondary | ICD-10-CM

## 2012-07-04 DIAGNOSIS — Z87891 Personal history of nicotine dependence: Secondary | ICD-10-CM

## 2012-07-04 DIAGNOSIS — L03319 Cellulitis of trunk, unspecified: Secondary | ICD-10-CM | POA: Diagnosis present

## 2012-07-04 DIAGNOSIS — G43909 Migraine, unspecified, not intractable, without status migrainosus: Secondary | ICD-10-CM | POA: Diagnosis present

## 2012-07-04 DIAGNOSIS — F3289 Other specified depressive episodes: Secondary | ICD-10-CM | POA: Diagnosis present

## 2012-07-04 DIAGNOSIS — E039 Hypothyroidism, unspecified: Secondary | ICD-10-CM | POA: Diagnosis present

## 2012-07-04 DIAGNOSIS — R509 Fever, unspecified: Secondary | ICD-10-CM

## 2012-07-04 DIAGNOSIS — Z96649 Presence of unspecified artificial hip joint: Secondary | ICD-10-CM

## 2012-07-04 DIAGNOSIS — F411 Generalized anxiety disorder: Secondary | ICD-10-CM | POA: Diagnosis present

## 2012-07-04 DIAGNOSIS — B951 Streptococcus, group B, as the cause of diseases classified elsewhere: Secondary | ICD-10-CM | POA: Diagnosis present

## 2012-07-04 DIAGNOSIS — F329 Major depressive disorder, single episode, unspecified: Secondary | ICD-10-CM | POA: Diagnosis present

## 2012-07-04 DIAGNOSIS — T888XXA Other specified complications of surgical and medical care, not elsewhere classified, initial encounter: Secondary | ICD-10-CM

## 2012-07-04 DIAGNOSIS — G969 Disorder of central nervous system, unspecified: Secondary | ICD-10-CM | POA: Diagnosis present

## 2012-07-04 DIAGNOSIS — L02219 Cutaneous abscess of trunk, unspecified: Secondary | ICD-10-CM | POA: Diagnosis present

## 2012-07-04 LAB — CBC WITH DIFFERENTIAL/PLATELET
Basophils Relative: 0 % (ref 0–1)
Eosinophils Absolute: 0 10*3/uL (ref 0.0–0.7)
Eosinophils Relative: 0 % (ref 0–5)
Hemoglobin: 11.7 g/dL — ABNORMAL LOW (ref 12.0–15.0)
Lymphs Abs: 1.7 10*3/uL (ref 0.7–4.0)
MCH: 30.4 pg (ref 26.0–34.0)
MCHC: 34.2 g/dL (ref 30.0–36.0)
MCV: 88.8 fL (ref 78.0–100.0)
Monocytes Relative: 13 % — ABNORMAL HIGH (ref 3–12)
Neutrophils Relative %: 72 % (ref 43–77)
Platelets: 244 10*3/uL (ref 150–400)

## 2012-07-04 LAB — URINALYSIS, ROUTINE W REFLEX MICROSCOPIC
Bilirubin Urine: NEGATIVE
Ketones, ur: 40 mg/dL — AB
Nitrite: NEGATIVE
Urobilinogen, UA: 0.2 mg/dL (ref 0.0–1.0)

## 2012-07-04 LAB — URINE MICROSCOPIC-ADD ON

## 2012-07-04 LAB — BASIC METABOLIC PANEL
BUN: 14 mg/dL (ref 6–23)
Calcium: 9.9 mg/dL (ref 8.4–10.5)
GFR calc Af Amer: >90 mL/min (ref >90)
GFR calc non Af Amer: 79 mL/min — ABNORMAL LOW (ref >90)
Glucose, Bld: 99 mg/dL (ref 70–99)
Potassium: 3.3 mEq/L — ABNORMAL LOW (ref 3.5–5.1)

## 2012-07-04 MED ORDER — BUPROPION HCL ER (XL) 300 MG PO TB24
300.0000 mg | ORAL_TABLET | Freq: Every evening | ORAL | Status: DC
  Administered 2012-07-04 – 2012-07-09 (×6): 300 mg via ORAL
  Filled 2012-07-04 (×7): qty 1

## 2012-07-04 MED ORDER — ALPRAZOLAM 0.25 MG PO TABS
0.2500 mg | ORAL_TABLET | Freq: Every evening | ORAL | Status: DC | PRN
  Administered 2012-07-05: 0.25 mg via ORAL
  Filled 2012-07-04: qty 1

## 2012-07-04 MED ORDER — CALCIUM CARBONATE-VITAMIN D 500-200 MG-UNIT PO TABS
2.0000 | ORAL_TABLET | Freq: Every day | ORAL | Status: DC
  Administered 2012-07-04 – 2012-07-06 (×3): 2 via ORAL
  Filled 2012-07-04 (×3): qty 2

## 2012-07-04 MED ORDER — DIAZEPAM 5 MG PO TABS: 5.0000 mg | ORAL_TABLET | Freq: Four times a day (QID) | ORAL | Status: DC | PRN

## 2012-07-04 MED ORDER — SUMATRIPTAN SUCCINATE 100 MG PO TABS
100.0000 mg | ORAL_TABLET | ORAL | Status: DC | PRN
  Filled 2012-07-04: qty 1

## 2012-07-04 MED ORDER — ACETAMINOPHEN 325 MG PO TABS: 650.0000 mg | ORAL_TABLET | Freq: Four times a day (QID) | ORAL | Status: DC | PRN

## 2012-07-04 MED ORDER — TOPIRAMATE 25 MG PO TABS
50.0000 mg | ORAL_TABLET | Freq: Every evening | ORAL | Status: DC
  Administered 2012-07-04 – 2012-07-05 (×2): 50 mg via ORAL
  Filled 2012-07-04 (×3): qty 2

## 2012-07-04 MED ORDER — ACETAMINOPHEN 325 MG PO TABS
650.0000 mg | ORAL_TABLET | Freq: Once | ORAL | Status: AC
  Administered 2012-07-04: 650 mg via ORAL
  Filled 2012-07-04: qty 2

## 2012-07-04 MED ORDER — ZOLPIDEM TARTRATE 5 MG PO TABS: 5.0000 mg | ORAL_TABLET | Freq: Every evening | ORAL | Status: DC | PRN

## 2012-07-04 MED ORDER — LEVOTHYROXINE SODIUM 25 MCG PO TABS
25.0000 ug | ORAL_TABLET | Freq: Every day | ORAL | Status: DC
  Administered 2012-07-05 – 2012-07-09 (×5): 25 ug via ORAL
  Filled 2012-07-04 (×6): qty 1

## 2012-07-04 MED ORDER — ATORVASTATIN CALCIUM 20 MG PO TABS
20.0000 mg | ORAL_TABLET | Freq: Every evening | ORAL | Status: DC
  Administered 2012-07-04 – 2012-07-09 (×6): 20 mg via ORAL
  Filled 2012-07-04 (×6): qty 1

## 2012-07-04 MED ORDER — SODIUM CHLORIDE 0.9 % IV BOLUS (SEPSIS)
1000.0000 mL | Freq: Once | INTRAVENOUS | Status: AC
  Administered 2012-07-04: 1000 mL via INTRAVENOUS

## 2012-07-04 MED ORDER — PANTOPRAZOLE SODIUM 40 MG PO TBEC
40.0000 mg | DELAYED_RELEASE_TABLET | Freq: Every day | ORAL | Status: DC
  Administered 2012-07-04 – 2012-07-09 (×5): 40 mg via ORAL
  Filled 2012-07-04 (×5): qty 1

## 2012-07-04 MED ORDER — LORAZEPAM 0.5 MG PO TABS
0.5000 mg | ORAL_TABLET | Freq: Three times a day (TID) | ORAL | Status: DC | PRN
  Administered 2012-07-05 – 2012-07-08 (×3): 0.5 mg via ORAL
  Filled 2012-07-04 (×3): qty 1

## 2012-07-04 MED ORDER — GADOBENATE DIMEGLUMINE 529 MG/ML IV SOLN
17.0000 mL | Freq: Once | INTRAVENOUS | Status: AC | PRN
  Administered 2012-07-04: 17 mL via INTRAVENOUS

## 2012-07-04 MED ORDER — ADULT MULTIVITAMIN W/MINERALS CH
1.0000 | ORAL_TABLET | Freq: Every day | ORAL | Status: DC
  Administered 2012-07-04 – 2012-07-09 (×5): 1 via ORAL
  Filled 2012-07-04 (×6): qty 1

## 2012-07-04 MED ORDER — OXYCODONE-ACETAMINOPHEN 5-325 MG PO TABS
1.0000 | ORAL_TABLET | ORAL | Status: DC | PRN
  Administered 2012-07-04 – 2012-07-06 (×7): 2 via ORAL
  Filled 2012-07-04 (×10): qty 2

## 2012-07-04 NOTE — ED Notes (Addendum)
Surgical site lower center of back noted to be swollen, painful and warm to touch. Raised area noted. States she was seen for follow up with neurosurgeon on Monday, symptoms began on Wednesday. 8/10 pain at the time.

## 2012-07-04 NOTE — ED Notes (Signed)
C/o fever since Wed. Sent here from [facility] for possible infection at surgical site. S/p back surgery 05-15-12.

## 2012-07-04 NOTE — H&P (Signed)
Melissa Leach is an 62 y.o. female.   Chief Complaint: fever HPI: 62 y/o patient who had lumbar fusion by dr Melissa Leach about 6 weeks ago. Lately she has been complaining of increase of temp up to 102 at night time associatedwith positional headache, weakness all over amd malaise. She was seen at [facility] er and transferred to [facility].at present she is awake with c/o lbp  Past Medical History  Diagnosis Date  . Gastritis     HISTORY OF GASTRITIS--OCCAS ABDOMINAL PAIN   . H/O hiatal hernia   . Anxiety   . Complication of anesthesia     "SCRATCHED CORNEA"  WAKING UP FROM  KNEE SURGERY--'82  . Headache     MIGRAINE- INFREQUENT  . Blood in urine     "ALWAYS"  --AND STATES ALWAYS TREATED FOR UTI--BUT HAS NEVER HAD UTI.  Marland Kitchen Arthritis     SEVERE PAIN RIGHT HIP  . Hyperlipidemia     takes Lipitor daily  . History of migraine     takes Topamax daily and Imitrex prn  . Joint pain   . Joint swelling   . Chronic back pain   . GERD (gastroesophageal reflux disease)     takes Omeprazole daily and states only bc she takes Mobic  . Nocturia   . Depression     PT'S HUSBAND DIED OF CANCER AT [facility] Feb 08, 2012  . Insomnia     takes Xanax and Ativan prn  . Hypothyroidism 05/13/12    Past Surgical History  Procedure Laterality Date  . Joint replacement      RIGHT TOTAL KNEE REPLACEMENT AND RT TOTAL KNEE REVISION    AND  LEFT TOTAL KNEE REPLACEMENT  . Tubal ligation    . Tonsillectomy    . Total hip arthroplasty  06/25/2011    Procedure: TOTAL HIP ARTHROPLASTY ANTERIOR APPROACH;  Surgeon: [name], MD;  Location: [facility];  Service: Orthopedics;  Laterality: Left;  Marland Kitchen Eye surgery  1994    RA surgery-  . Total knee revision  10/28/2011    Procedure: TOTAL KNEE REVISION;  Surgeon: [name], MD;  Location: [facility];  Service: Orthopedics;  Laterality: Right;  . Hematoma evacuation  12/09/2011    Procedure: EVACUATION HEMATOMA;  Surgeon: [name], MD;  Location: [facility];  Service:  Orthopedics;  Laterality: Right;  Evacuation of Hematoma Right Knee, I & D and Closed Manipulation of Right Knee  . Irrigation and debridement knee  12/09/2011    Procedure: IRRIGATION AND DEBRIDEMENT KNEE;  Surgeon: [name], MD;  Location: [facility];  Service: Orthopedics;  Laterality: Right;  . Knee closed reduction  12/09/2011    Procedure: CLOSED MANIPULATION KNEE;  Surgeon: [name], MD;  Location: [facility];  Service: Orthopedics;  Laterality: Right;  . Total hip arthroplasty  03/02/2012    Procedure: TOTAL HIP ARTHROPLASTY ANTERIOR APPROACH;  Surgeon: [name], MD;  Location: [facility];  Service: Orthopedics;  Laterality: Right;  . Colonoscopy    . Esophagogastroduodenoscopy    . Epidural injections      x 2 in the past 4weeks    No family history on file. Social History:  reports that she has quit smoking. She quit smokeless tobacco use about 35 years ago. She reports that she does not drink alcohol or use illicit drugs.  Allergies:  Allergies  Allergen Reactions  . Sulfa Antibiotics Rash     (Not in a hospital admission)  Results for  orders placed during the hospital encounter of 07/04/12 (from the past 48 hour(s))  CBC WITH DIFFERENTIAL     Status: Abnormal   Collection Time    07/04/12  3:19 PM      Result Value Range   WBC 11.7 (*) 4.0 - 10.5 K/uL   RBC 3.85 (*) 3.87 - 5.11 MIL/uL   Hemoglobin 11.7 (*) 12.0 - 15.0 g/dL   HCT 16.1 (*) 09.6 - 04.5 %   MCV 88.8  78.0 - 100.0 fL   MCH 30.4  26.0 - 34.0 pg   MCHC 34.2  30.0 - 36.0 g/dL   RDW 40.9  81.1 - 91.4 %   Platelets 244  150 - 400 K/uL   Neutrophils Relative 72  43 - 77 %   Neutro Abs 8.4 (*) 1.7 - 7.7 K/uL   Lymphocytes Relative 15  12 - 46 %   Lymphs Abs 1.7  0.7 - 4.0 K/uL   Monocytes Relative 13 (*) 3 - 12 %   Monocytes Absolute 1.5 (*) 0.1 - 1.0 K/uL   Eosinophils Relative 0  0 - 5 %   Eosinophils Absolute 0.0  0.0 - 0.7 K/uL   Basophils Relative 0  0 - 1 %   Basophils Absolute 0.0  0.0 - 0.1  K/uL  BASIC METABOLIC PANEL     Status: Abnormal   Collection Time    07/04/12  3:19 PM      Result Value Range   Sodium 135  135 - 145 mEq/L   Potassium 3.3 (*) 3.5 - 5.1 mEq/L   Chloride 102  96 - 112 mEq/L   CO2 20  19 - 32 mEq/L   Glucose, Bld 99  70 - 99 mg/dL   BUN 14  6 - 23 mg/dL   Creatinine, Ser 7.82  0.50 - 1.10 mg/dL   Calcium 9.9  8.4 - 95.6 mg/dL   GFR calc non Af Amer 79 (*) >90 mL/min   GFR calc Af Amer >90  >90 mL/min   Comment:            The eGFR has been calculated     using the CKD EPI equation.     This calculation has not been     validated in all clinical     situations.     eGFR's persistently     <90 mL/min signify     possible Chronic Kidney Disease.   No results found.  Review of Systems  Constitutional: Negative for fever, chills and malaise/fatigue.  Respiratory: Negative.   Cardiovascular: Negative.   Gastrointestinal: Negative.   Musculoskeletal: Positive for back pain and joint pain.  Neurological: Positive for headaches.  Endo/Heme/Allergies: Negative.   Psychiatric/Behavioral: Negative.     Blood pressure 110/49, pulse 115, temperature 99.8 F (37.7 C), temperature source Oral, height 5\' 1"  (1.549 m), weight 80.74 kg (178 lb), SpO2 100.00%. Physical Exam hent, nl. Neck,nl. Lungs, clear. Cv, nl. Abdomen, soft. Extremities scars in knees and hip. 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 Will get a mri of lumbar to r/o discitis  Melissa Leach M 07/04/2012, 4:17 PM

## 2012-07-04 NOTE — ED Provider Notes (Addendum)
62 year old female with a history of recent lower back surgery, spinal surgeon is Dr. Yetta Barre, presents with fever over the last week which tends to be more at night, associated with chills, fever over 101. She denies any other symptoms including nausea vomiting diarrhea or rash abdominal pain chest pain cough or shortness of breath. She has noticed increased swelling and tenderness of her lower back but she states that this is mild and persistent since surgery.  On exam the patient has a swollen mildly tender area underlying the surgical site, mild erythema surrounding the surgical scar, no significant increased warmth to the skin, no induration, no fluctuance. Her abdomen is soft, lungs are clear, pulse significant for tachycardia but is able to move around in the bed and has neurologic intact function to her lower extremities including strength and sensation. Oropharynx is clear, mucous membranes are moist.  Physician assistant will consult with neurosurgery, plan is to further evaluate possible lumbar pathology in coordination with neurosurgical consultation. The patient is hemodynamically stable though mildly tachycardic at this time.  Dr. Jeral Fruit has seen pt and will admit for further w/u.  Vida Roller, MD 07/04/12 1529  Vida Roller, MD 07/04/12 [phone]

## 2012-07-04 NOTE — ED Provider Notes (Signed)
History     CSN: [id number]  Arrival date & time 07/04/12  1351   First MD Initiated Contact with Patient 07/04/12 1501      Chief Complaint  Patient presents with  . Fever    (Consider location/radiation/quality/duration/timing/severity/associated sxs/prior treatment) Patient is a 62 y.o. female presenting with general illness.  Illness  The current episode started 3 to 5 days ago. The onset was gradual. The problem occurs continuously. The problem has been unchanged. The problem is moderate. The symptoms are relieved by acetaminophen. Nothing aggravates the symptoms. Associated symptoms include a fever. Pertinent negatives include no abdominal pain, no diarrhea, no nausea, no vomiting, no congestion, no rhinorrhea, no sore throat, no cough, no URI and no rash.    Past Medical History  Diagnosis Date  . Gastritis     HISTORY OF GASTRITIS--OCCAS ABDOMINAL PAIN   . H/O hiatal hernia   . Anxiety   . Complication of anesthesia     "SCRATCHED CORNEA"  WAKING UP FROM  KNEE SURGERY--'82  . Headache     MIGRAINE- INFREQUENT  . Blood in urine     "ALWAYS"  --AND STATES ALWAYS TREATED FOR UTI--BUT HAS NEVER HAD UTI.  Marland Kitchen Arthritis     SEVERE PAIN RIGHT HIP  . Hyperlipidemia     takes Lipitor daily  . History of migraine     takes Topamax daily and Imitrex prn  . Joint pain   . Joint swelling   . Chronic back pain   . GERD (gastroesophageal reflux disease)     takes Omeprazole daily and states only bc she takes Mobic  . Nocturia   . Depression     PT'S HUSBAND DIED OF CANCER AT [facility] 2012/02/11  . Insomnia     takes Xanax and Ativan prn  . Hypothyroidism 05/13/12    Past Surgical History  Procedure Laterality Date  . Joint replacement      RIGHT TOTAL KNEE REPLACEMENT AND RT TOTAL KNEE REVISION    AND  LEFT TOTAL KNEE REPLACEMENT  . Tubal ligation    . Tonsillectomy    . Total hip arthroplasty  06/25/2011    Procedure: TOTAL HIP ARTHROPLASTY ANTERIOR APPROACH;  Surgeon: [name], MD;  Location: [facility];  Service: Orthopedics;  Laterality: Left;  Marland Kitchen Eye surgery  1994    RA surgery-  . Total knee revision  10/28/2011    Procedure: TOTAL KNEE REVISION;  Surgeon: [name], MD;  Location: [facility];  Service: Orthopedics;  Laterality: Right;  . Hematoma evacuation  12/09/2011    Procedure: EVACUATION HEMATOMA;  Surgeon: [name], MD;  Location: [facility];  Service: Orthopedics;  Laterality: Right;  Evacuation of Hematoma Right Knee, I & D and Closed Manipulation of Right Knee  . Irrigation and debridement knee  12/09/2011    Procedure: IRRIGATION AND DEBRIDEMENT KNEE;  Surgeon: [name], MD;  Location: [facility];  Service: Orthopedics;  Laterality: Right;  . Knee closed reduction  12/09/2011    Procedure: CLOSED MANIPULATION KNEE;  Surgeon: [name], MD;  Location: [facility];  Service: Orthopedics;  Laterality: Right;  . Total hip arthroplasty  03/02/2012    Procedure: TOTAL HIP ARTHROPLASTY ANTERIOR APPROACH;  Surgeon: [name], MD;  Location: [facility];  Service: Orthopedics;  Laterality: Right;  . Colonoscopy    . Esophagogastroduodenoscopy    . Epidural injections      x 2 in the past 4weeks  No family history on file.  History  Substance Use Topics  . Smoking status: Former Games developer  . Smokeless tobacco: Former Neurosurgeon    Quit date: 10/21/1976     Comment: QUIT SMOKING OVER 25 YRS AGO  . Alcohol Use: No    OB History   Grav Para Term Preterm Abortions TAB SAB Ect Mult Living                  Review of Systems  Constitutional: Positive for fever.  HENT: Negative for congestion, sore throat and rhinorrhea.   Respiratory: Negative for cough and shortness of breath.   Cardiovascular: Negative for chest pain.  Gastrointestinal: Negative for nausea, vomiting, abdominal pain and diarrhea.  Genitourinary: Negative for difficulty urinating.  Skin: Negative for rash.  All other systems reviewed and are negative.    Allergies  Sulfa  antibiotics  Home Medications   Current Outpatient Rx  Name  Route  Sig  Dispense  Refill  . ALPRAZolam (XANAX) 0.5 MG tablet   Oral   Take 0.25 mg by mouth at bedtime as needed for sleep.          Marland Kitchen atorvastatin (LIPITOR) 40 MG tablet   Oral   Take 20 mg by mouth every evening.          Marland Kitchen buPROPion (WELLBUTRIN XL) 300 MG 24 hr tablet   Oral   Take 300 mg by mouth every evening.         . calcium-vitamin D (OSCAL WITH D) 500-200 MG-UNIT per tablet   Oral   Take 2 tablets by mouth daily.          Marland Kitchen levothyroxine (SYNTHROID, LEVOTHROID) 25 MCG tablet   Oral   Take 25 mcg by mouth daily.         Marland Kitchen LORazepam (ATIVAN) 0.5 MG tablet   Oral   Take 0.5 mg by mouth every 8 (eight) hours as needed for anxiety.          . Multiple Vitamin (MULTIVITAMIN WITH MINERALS) TABS   Oral   Take 1 tablet by mouth daily.         Marland Kitchen omeprazole (PRILOSEC) 40 MG capsule   Oral   Take 40 mg by mouth every evening.         . SUMAtriptan (IMITREX) 100 MG tablet   Oral   Take 100 mg by mouth every 2 (two) hours as needed for migraine.          . topiramate (TOPAMAX) 50 MG tablet   Oral   Take 100 mg by mouth every evening.           BP 110/49  Pulse 115  Temp(Src) 99.8 F (37.7 C) (Oral)  Ht 5\' 1"  (1.549 m)  Wt 178 lb (80.74 kg)  BMI 33.65 kg/m2  SpO2 100%  Physical Exam  Nursing note and vitals reviewed. Constitutional: She is oriented to person, place, and time. She appears well-developed and well-nourished. No distress.  HENT:  Head: Normocephalic and atraumatic.  Mouth/Throat: Oropharynx is clear and moist.  Eyes: Conjunctivae are normal. Pupils are equal, round, and reactive to light. No scleral icterus.  Neck: Neck supple.  Cardiovascular: Normal rate, regular rhythm, normal heart sounds and intact distal pulses.   No murmur heard. Pulmonary/Chest: Effort normal and breath sounds normal. No stridor. No respiratory distress. She has no rales.  Abdominal:  Soft. Bowel sounds are normal. She exhibits no distension. There is no tenderness.  Musculoskeletal:  Normal range of motion.  Lumbar surgical incision with mild surrounding erythema.  Soft mass just right of midline which is not particularly tender.    Neurological: She is alert and oriented to person, place, and time.  Skin: Skin is warm and dry. No rash noted.  Psychiatric: She has a normal mood and affect. Her behavior is normal.    ED Course  Procedures (including critical care time)  Labs Reviewed  CBC WITH DIFFERENTIAL - Abnormal; Notable for the following:    WBC 11.7 (*)    RBC 3.85 (*)    Hemoglobin 11.7 (*)    HCT 34.2 (*)    Neutro Abs 8.4 (*)    Monocytes Relative 13 (*)    Monocytes Absolute 1.5 (*)    All other components within normal limits  BASIC METABOLIC PANEL - Abnormal; Notable for the following:    Potassium 3.3 (*)    GFR calc non Af Amer 79 (*)    All other components within normal limits  URINALYSIS, ROUTINE W REFLEX MICROSCOPIC   No results found.   1. Fever   2. Fluid collection at surgical site, initial encounter       MDM   62 yo female with hx of lumbar spine surgery 1.5 months ago presenting with several days of fever.  Operative site is mildly erythematous, mildly tender, and has a soft mass associated with it.  Pt denies any other infectious symptoms.  No neurologic symptoms or deficits on exam.    4:24 PM NSU has evaluated and plans to admit for further workup.       Rennis Petty, MD 07/04/12 [phone]

## 2012-07-04 NOTE — ED Notes (Signed)
Pt transported to MRI 

## 2012-07-04 NOTE — ED Provider Notes (Signed)
I saw and evaluated the patient, reviewed the resident's note and I agree with the findings and plan.  Pertinent History and physical exam, see my separate note  Vida Roller, MD 07/04/12 [phone]

## 2012-07-04 NOTE — Progress Notes (Signed)
Patient received from ED, assessments wnl, has swelling at old incision site. No acute distress complained so far.

## 2012-07-04 NOTE — ED Notes (Addendum)
Report given to [place], Charity fundraiser. Home medications given, floor nurse aware. Vital signs stable upon transport.

## 2012-07-05 NOTE — Progress Notes (Signed)
Patient had blood cultures ordered if temp >100.5. Order was placed at 0450. Called lab 5 times and nobody showed up till 58.

## 2012-07-05 NOTE — Progress Notes (Signed)
Patient ID: Melissa Leach, female   DOB: 16-Aug-1950, 62 y.o.   MRN: [id number] Blood cultures done . No drainage in lumbar wound. Minimal headache. ID to see. Aspiration versus open drainage?

## 2012-07-05 NOTE — Progress Notes (Signed)
No fever, wbc mild elevation. Mri pseudomeningocele. Some blood. Plan continue observation. If temp goes above 101.6 will go ahead with blood cultures.

## 2012-07-06 ENCOUNTER — Encounter (HOSPITAL_COMMUNITY)
Admission: EM | Disposition: A | Discharge status: Home / Self Care | Payer: Self-pay | Source: Home / Self Care | Attending: Neurosurgery

## 2012-07-06 ENCOUNTER — Inpatient Hospital Stay (HOSPITAL_COMMUNITY): Payer: 59 | Admitting: Anesthesiology

## 2012-07-06 ENCOUNTER — Encounter (HOSPITAL_COMMUNITY): Payer: Self-pay | Admitting: Anesthesiology

## 2012-07-06 HISTORY — PX: WOUND EXPLORATION: SHX6188

## 2012-07-06 LAB — CBC WITH DIFFERENTIAL/PLATELET
Basophils Relative: 0 % (ref 0–1)
Hemoglobin: 10 g/dL — ABNORMAL LOW (ref 12.0–15.0)
Lymphs Abs: 2.1 10*3/uL (ref 0.7–4.0)
MCHC: 33.3 g/dL (ref 30.0–36.0)
Monocytes Relative: 11 % (ref 3–12)
Neutro Abs: 7.6 10*3/uL (ref 1.7–7.7)
Neutrophils Relative %: 68 % (ref 43–77)
RBC: 3.4 MIL/uL — ABNORMAL LOW (ref 3.87–5.11)

## 2012-07-06 LAB — URINE CULTURE

## 2012-07-06 SURGERY — WOUND EXPLORATION
Anesthesia: General | Site: Spine Lumbar | Laterality: Bilateral | Wound class: Dirty or Infected

## 2012-07-06 MED ORDER — OXYCODONE-ACETAMINOPHEN 5-325 MG PO TABS
1.0000 | ORAL_TABLET | ORAL | Status: DC | PRN
  Administered 2012-07-07 (×4): 2 via ORAL
  Administered 2012-07-08: 1 via ORAL
  Administered 2012-07-08 (×3): 2 via ORAL
  Administered 2012-07-09: 1 via ORAL
  Administered 2012-07-09 (×2): 2 via ORAL
  Filled 2012-07-06 (×8): qty 2

## 2012-07-06 MED ORDER — MIDAZOLAM HCL 5 MG/5ML IJ SOLN
INTRAMUSCULAR | Status: DC | PRN
  Administered 2012-07-06: 2 mg via INTRAVENOUS

## 2012-07-06 MED ORDER — LACTATED RINGERS IV SOLN
INTRAVENOUS | Status: DC | PRN
  Administered 2012-07-06 (×2): via INTRAVENOUS

## 2012-07-06 MED ORDER — ROCURONIUM BROMIDE 100 MG/10ML IV SOLN
INTRAVENOUS | Status: DC | PRN
  Administered 2012-07-06: 20 mg via INTRAVENOUS

## 2012-07-06 MED ORDER — VANCOMYCIN HCL 1000 MG IV SOLR
1000.0000 mg | Freq: Two times a day (BID) | INTRAVENOUS | Status: DC
  Administered 2012-07-06: 1000 mg via INTRAVENOUS
  Filled 2012-07-06 (×2): qty 1000

## 2012-07-06 MED ORDER — DEXTROSE 5 % IV SOLN
2.0000 g | Freq: Two times a day (BID) | INTRAVENOUS | Status: DC
  Filled 2012-07-06: qty 2

## 2012-07-06 MED ORDER — ALUM & MAG HYDROXIDE-SIMETH 200-200-20 MG/5ML PO SUSP: 30.0000 mL | Freq: Four times a day (QID) | ORAL | Status: DC | PRN

## 2012-07-06 MED ORDER — HYDROCODONE-ACETAMINOPHEN 5-325 MG PO TABS
1.0000 | ORAL_TABLET | ORAL | Status: DC | PRN
  Administered 2012-07-07 – 2012-07-09 (×2): 2 via ORAL
  Filled 2012-07-06 (×2): qty 2

## 2012-07-06 MED ORDER — HYDROMORPHONE HCL PF 1 MG/ML IJ SOLN
0.5000 mg | INTRAMUSCULAR | Status: DC | PRN
  Administered 2012-07-07: 0.5 mg via INTRAVENOUS
  Filled 2012-07-06: qty 1

## 2012-07-06 MED ORDER — NAPHAZOLINE-PHENIRAMINE 0.025-0.3 % OP SOLN
1.0000 [drp] | Freq: Four times a day (QID) | OPHTHALMIC | Status: DC | PRN
  Administered 2012-07-06: 1 [drp] via OPHTHALMIC
  Filled 2012-07-06: qty 15

## 2012-07-06 MED ORDER — DEXTROSE 5 % IV SOLN
1.0000 g | INTRAVENOUS | Status: AC
  Administered 2012-07-06: 1 g via INTRAVENOUS
  Filled 2012-07-06 (×2): qty 10

## 2012-07-06 MED ORDER — BUPIVACAINE HCL (PF) 0.25 % IJ SOLN
INTRAMUSCULAR | Status: DC | PRN
  Administered 2012-07-06: 30 mL

## 2012-07-06 MED ORDER — 0.9 % SODIUM CHLORIDE (POUR BTL) OPTIME
TOPICAL | Status: DC | PRN
  Administered 2012-07-06: 1000 mL

## 2012-07-06 MED ORDER — THROMBIN 5000 UNITS EX SOLR
CUTANEOUS | Status: DC | PRN
  Administered 2012-07-06 (×2): 5000 [IU] via TOPICAL

## 2012-07-06 MED ORDER — NAPHAZOLINE HCL 0.1 % OP SOLN
1.0000 [drp] | Freq: Four times a day (QID) | OPHTHALMIC | Status: DC | PRN
  Filled 2012-07-06: qty 15

## 2012-07-06 MED ORDER — ACETAMINOPHEN 650 MG RE SUPP: 650.0000 mg | RECTAL | Status: DC | PRN

## 2012-07-06 MED ORDER — FENTANYL CITRATE 0.05 MG/ML IJ SOLN
50.0000 ug | INTRAMUSCULAR | Status: DC | PRN
  Administered 2012-07-06: 50 ug via INTRAVENOUS

## 2012-07-06 MED ORDER — ONDANSETRON HCL 4 MG/2ML IJ SOLN: 4.0000 mg | Freq: Once | INTRAMUSCULAR | Status: DC | PRN

## 2012-07-06 MED ORDER — SODIUM CHLORIDE 0.9 % IR SOLN
Status: DC | PRN
  Administered 2012-07-06: 15:00:00

## 2012-07-06 MED ORDER — SENNA 8.6 MG PO TABS
1.0000 | ORAL_TABLET | Freq: Two times a day (BID) | ORAL | Status: DC
  Administered 2012-07-06 – 2012-07-09 (×6): 8.6 mg via ORAL
  Filled 2012-07-06 (×8): qty 1

## 2012-07-06 MED ORDER — ONDANSETRON HCL 4 MG/2ML IJ SOLN
INTRAMUSCULAR | Status: DC | PRN
  Administered 2012-07-06: 4 mg via INTRAVENOUS

## 2012-07-06 MED ORDER — FENTANYL CITRATE 0.05 MG/ML IJ SOLN
INTRAMUSCULAR | Status: DC | PRN
  Administered 2012-07-06 (×3): 50 ug via INTRAVENOUS
  Administered 2012-07-06: 100 ug via INTRAVENOUS

## 2012-07-06 MED ORDER — MENTHOL 3 MG MT LOZG: 1.0000 | LOZENGE | OROMUCOSAL | Status: DC | PRN

## 2012-07-06 MED ORDER — DEXTROSE 5 % IV SOLN
1.0000 g | Freq: Three times a day (TID) | INTRAVENOUS | Status: DC
  Administered 2012-07-06 – 2012-07-07 (×2): 1 g via INTRAVENOUS
  Filled 2012-07-06 (×6): qty 1

## 2012-07-06 MED ORDER — ACETAMINOPHEN 325 MG PO TABS
650.0000 mg | ORAL_TABLET | ORAL | Status: DC | PRN
  Filled 2012-07-06: qty 2

## 2012-07-06 MED ORDER — KETOROLAC TROMETHAMINE 30 MG/ML IJ SOLN
30.0000 mg | Freq: Four times a day (QID) | INTRAMUSCULAR | Status: DC
  Administered 2012-07-06 – 2012-07-09 (×10): 30 mg via INTRAVENOUS
  Filled 2012-07-06 (×16): qty 1

## 2012-07-06 MED ORDER — SODIUM CHLORIDE 0.9 % IJ SOLN: 3.0000 mL | INTRAMUSCULAR | Status: DC | PRN

## 2012-07-06 MED ORDER — POLYETHYLENE GLYCOL 3350 17 G PO PACK
17.0000 g | PACK | Freq: Every day | ORAL | Status: DC | PRN
  Administered 2012-07-08: 17 g via ORAL
  Filled 2012-07-06: qty 1

## 2012-07-06 MED ORDER — PROPOFOL 10 MG/ML IV BOLUS
INTRAVENOUS | Status: DC | PRN
  Administered 2012-07-06: 200 mg via INTRAVENOUS

## 2012-07-06 MED ORDER — BISACODYL 10 MG RE SUPP: 10.0000 mg | Freq: Every day | RECTAL | Status: DC | PRN

## 2012-07-06 MED ORDER — VANCOMYCIN HCL 10 G IV SOLR
1500.0000 mg | INTRAVENOUS | Status: DC
  Filled 2012-07-06: qty 1500

## 2012-07-06 MED ORDER — FLEET ENEMA 7-19 GM/118ML RE ENEM: 1.0000 | ENEMA | Freq: Once | RECTAL | Status: AC | PRN

## 2012-07-06 MED ORDER — LIDOCAINE HCL (CARDIAC) 20 MG/ML IV SOLN
INTRAVENOUS | Status: DC | PRN
  Administered 2012-07-06: 100 mg via INTRAVENOUS

## 2012-07-06 MED ORDER — SODIUM CHLORIDE 0.9 % IJ SOLN
3.0000 mL | Freq: Two times a day (BID) | INTRAMUSCULAR | Status: DC
  Administered 2012-07-06 – 2012-07-08 (×4): 3 mL via INTRAVENOUS

## 2012-07-06 MED ORDER — VANCOMYCIN HCL IN DEXTROSE 1-5 GM/200ML-% IV SOLN
1000.0000 mg | Freq: Two times a day (BID) | INTRAVENOUS | Status: DC
  Administered 2012-07-06 – 2012-07-08 (×4): 1000 mg via INTRAVENOUS
  Filled 2012-07-06 (×5): qty 200

## 2012-07-06 MED ORDER — PHENOL 1.4 % MT LIQD: 1.0000 | OROMUCOSAL | Status: DC | PRN

## 2012-07-06 MED ORDER — ONDANSETRON HCL 4 MG/2ML IJ SOLN: 4.0000 mg | INTRAMUSCULAR | Status: DC | PRN

## 2012-07-06 MED ORDER — HEMOSTATIC AGENTS (NO CHARGE) OPTIME
TOPICAL | Status: DC | PRN
  Administered 2012-07-06: 1 via TOPICAL

## 2012-07-06 MED ORDER — SODIUM CHLORIDE 0.9 % IV SOLN: 250.0000 mL | INTRAVENOUS | Status: DC

## 2012-07-06 MED ORDER — HYDROMORPHONE HCL PF 1 MG/ML IJ SOLN
0.2500 mg | INTRAMUSCULAR | Status: DC | PRN
  Administered 2012-07-06 (×2): 0.5 mg via INTRAVENOUS

## 2012-07-06 SURGICAL SUPPLY — 53 items
ADH SKN CLS APL DERMABOND .7 (GAUZE/BANDAGES/DRESSINGS)
APL SKNCLS STERI-STRIP NONHPOA (GAUZE/BANDAGES/DRESSINGS)
BAG DECANTER FOR FLEXI CONT (MISCELLANEOUS) ×2 IMPLANT
BENZOIN TINCTURE PRP APPL 2/3 (GAUZE/BANDAGES/DRESSINGS) ×1 IMPLANT
BLADE SURG ROTATE 9660 (MISCELLANEOUS) IMPLANT
BRUSH SCRUB EZ PLAIN DRY (MISCELLANEOUS) ×2 IMPLANT
CANISTER SUCTION 2500CC (MISCELLANEOUS) ×2 IMPLANT
CLOTH BEACON ORANGE TIMEOUT ST (SAFETY) ×2 IMPLANT
CONT SPEC 4OZ CLIKSEAL STRL BL (MISCELLANEOUS) ×2 IMPLANT
DERMABOND ADVANCED (GAUZE/BANDAGES/DRESSINGS)
DERMABOND ADVANCED .7 DNX12 (GAUZE/BANDAGES/DRESSINGS) ×1 IMPLANT
DRAPE LAPAROTOMY 100X72X124 (DRAPES) ×2 IMPLANT
DRAPE POUCH INSTRU U-SHP 10X18 (DRAPES) ×2 IMPLANT
DRAPE SURG 17X23 STRL (DRAPES) ×2 IMPLANT
ELECT REM PT RETURN 9FT ADLT (ELECTROSURGICAL) ×2
ELECTRODE REM PT RTRN 9FT ADLT (ELECTROSURGICAL) ×1 IMPLANT
EVACUATOR 1/8 PVC DRAIN (DRAIN) ×1 IMPLANT
GAUZE SPONGE 4X4 16PLY XRAY LF (GAUZE/BANDAGES/DRESSINGS) IMPLANT
GLOVE BIOGEL PI IND STRL 7.0 (GLOVE) IMPLANT
GLOVE BIOGEL PI IND STRL 7.5 (GLOVE) IMPLANT
GLOVE BIOGEL PI INDICATOR 7.0 (GLOVE) ×2
GLOVE BIOGEL PI INDICATOR 7.5 (GLOVE) ×1
GLOVE ECLIPSE 7.5 STRL STRAW (GLOVE) ×2 IMPLANT
GLOVE ECLIPSE 8.5 STRL (GLOVE) ×2 IMPLANT
GLOVE EXAM NITRILE LRG STRL (GLOVE) IMPLANT
GLOVE EXAM NITRILE MD LF STRL (GLOVE) ×1 IMPLANT
GLOVE EXAM NITRILE XL STR (GLOVE) IMPLANT
GLOVE EXAM NITRILE XS STR PU (GLOVE) IMPLANT
GLOVE SS BIOGEL STRL SZ 6.5 (GLOVE) IMPLANT
GLOVE SUPERSENSE BIOGEL SZ 6.5 (GLOVE) ×2
GOWN BRE IMP SLV AUR LG STRL (GOWN DISPOSABLE) ×1 IMPLANT
GOWN BRE IMP SLV AUR XL STRL (GOWN DISPOSABLE) ×3 IMPLANT
GOWN STRL REIN 2XL LVL4 (GOWN DISPOSABLE) IMPLANT
KIT BASIN OR (CUSTOM PROCEDURE TRAY) ×2 IMPLANT
KIT ROOM TURNOVER OR (KITS) ×2 IMPLANT
NDL HYPO 25X1 1.5 SAFETY (NEEDLE) IMPLANT
NEEDLE HYPO 25X1 1.5 SAFETY (NEEDLE) ×2 IMPLANT
NS IRRIG 1000ML POUR BTL (IV SOLUTION) ×2 IMPLANT
PACK LAMINECTOMY NEURO (CUSTOM PROCEDURE TRAY) ×2 IMPLANT
SPONGE GAUZE 4X4 12PLY (GAUZE/BANDAGES/DRESSINGS) ×1 IMPLANT
SPONGE SURGIFOAM ABS GEL SZ50 (HEMOSTASIS) ×2 IMPLANT
STRIP CLOSURE SKIN 1/2X4 (GAUZE/BANDAGES/DRESSINGS) ×1 IMPLANT
SUT ETHILON 2 0 FS 18 (SUTURE) ×2 IMPLANT
SUT VIC AB 0 CT1 18XCR BRD8 (SUTURE) ×1 IMPLANT
SUT VIC AB 0 CT1 8-18 (SUTURE) ×2
SUT VIC AB 2-0 CT1 18 (SUTURE) ×2 IMPLANT
SWAB CULTURE LIQ STUART DBL (MISCELLANEOUS) ×2 IMPLANT
SYR 20ML ECCENTRIC (SYRINGE) ×2 IMPLANT
TAPE CLOTH SURG 4X10 WHT LF (GAUZE/BANDAGES/DRESSINGS) ×1 IMPLANT
TOWEL OR 17X24 6PK STRL BLUE (TOWEL DISPOSABLE) ×2 IMPLANT
TOWEL OR 17X26 10 PK STRL BLUE (TOWEL DISPOSABLE) ×2 IMPLANT
TUBE ANAEROBIC SPECIMEN COL (MISCELLANEOUS) ×2 IMPLANT
WATER STERILE IRR 1000ML POUR (IV SOLUTION) ×2 IMPLANT

## 2012-07-06 NOTE — Anesthesia Preprocedure Evaluation (Signed)
Anesthesia Evaluation  Patient identified by MRN, date of birth, ID band Patient awake    Reviewed: Allergy & Precautions, H&P , NPO status , Patient's Chart, lab work & pertinent test results  Airway Mallampati: I TM Distance: >3 FB Neck ROM: full    Dental   Pulmonary former smoker,          Cardiovascular Rhythm:regular Rate:Normal     Neuro/Psych  Headaches,    GI/Hepatic hiatal hernia, GERD-  ,  Endo/Other  Hypothyroidism   Renal/GU      Musculoskeletal   Abdominal   Peds  Hematology   Anesthesia Other Findings   Reproductive/Obstetrics                           Anesthesia Physical Anesthesia Plan  ASA: I  Anesthesia Plan: General   Post-op Pain Management:    Induction: Intravenous  Airway Management Planned: Oral ETT  Additional Equipment:   Intra-op Plan:   Post-operative Plan: Extubation in OR  Informed Consent: I have reviewed the patients History and Physical, chart, labs and discussed the procedure including the risks, benefits and alternatives for the proposed anesthesia with the patient or authorized representative who has indicated his/her understanding and acceptance.     Plan Discussed with: CRNA, Anesthesiologist and Surgeon  Anesthesia Plan Comments:         Anesthesia Quick Evaluation

## 2012-07-06 NOTE — Preoperative (Signed)
Beta Blockers   Reason not to administer Beta Blockers:Not Applicable 

## 2012-07-06 NOTE — Transfer of Care (Signed)
Immediate Anesthesia Transfer of Care Note  Patient: Melissa Leach  Procedure(s) Performed: Procedure(s) with comments: WOUND EXPLORATION lumbar (Bilateral) - Incision and Drainage  Patient Location: PACU  Anesthesia Type:General  Level of Consciousness: awake, alert  and oriented  Airway & Oxygen Therapy: Patient Spontanous Breathing and Patient connected to nasal cannula oxygen  Post-op Assessment: Report given to PACU RN, Post -op Vital signs reviewed and stable and Patient moving all extremities  Post vital signs: Reviewed and stable  Complications: No apparent anesthesia complications

## 2012-07-06 NOTE — Op Note (Signed)
Date of procedure: 07/06/2012  Date of dictation: Same  Service: Neurosurgery  Preoperative diagnosis: Lumbar wound infection with subcutaneous abscess  Postoperative diagnosis: Same  Procedure Name: Reexploration of lumbar wound with evacuation of subcutaneous abscess. Irrigation with minimal sharp debridement of subcutaneous abscess.  Placement of subcutaneous drain.  Surgeon:Abena Erdman A.Joren Rehm, M.D.  Asst. Surgeon: None  Anesthesia: General  Indication: 62 year old female approximately 2 months status post lumbar decompression and fusion surgery by Dr. Yetta Barre. Patient presents now with an enlarging painful subcutaneous fluid collection and associated fevers. MRI scan is consistent with a subcutaneous abscess. No evidence of deep wound space infection or CSF leak. Patient presents now for irrigation debridement of this subcutaneous abscess and to obtain cultures.  Operative note: After induction of anesthesia, patient positioned prone onto Wilson frame and appropriately padded. Lumbar region prepped and draped. Incision made through the existing lumbar incision. Purulent material under pressure was encountered. Cultures were area and the abscess cavity was fully suctioned clean. The walls of the abscess cavity were scrubbed clean using 4 x 4 gauzes. There was minimal sharp debridement necessary. The abscess cavity was vigorously irrigated and total fluid was clear. The fascia was explored and found to be intact. A medium large Hemovac drain was left in the subcutaneous space. Skin was reapproximated with 2-0 nylon sutures in an interrupted vertical mattress fashion. There were no apparent complications. Cultures are pending. Antibiotics were given. Patient tolerated the procedure well and returned to the recovery room postop.

## 2012-07-06 NOTE — Progress Notes (Signed)
I will put in formal note. I did want to mention that this patient in my opinion SHOULD NOT receive preoperative antibiotics such as vancomycin as this can dramatically reduce yield on cultures that will be done with her therapeutic and diagnostic  I and D.  I have dc'd vancomycin in computer hopefully she has not received this yet

## 2012-07-06 NOTE — Progress Notes (Signed)
ANTIBIOTIC CONSULT NOTE - INITIAL  Pharmacy Consult for Vancomycin Indication: cellulits  Allergies  Allergen Reactions  . Sulfa Antibiotics Rash    Patient Measurements: Height: 5\' 1"  (154.9 cm) Weight: 178 lb (80.74 kg) IBW/kg (Calculated) : 47.8 Adjusted Body Weight:   Vital Signs: Temp: 98.2 F (36.8 C) (04/07 1754) BP: 91/44 mmHg (04/07 1754) Pulse Rate: 110 (04/07 1754) Intake/Output from previous day: 04/06 0701 - 04/07 0700 In: 480 [P.O.:480] Out: -  Intake/Output from this shift:    Labs:  Recent Labs  07/04/12 1519  WBC 11.7*  HGB 11.7*  PLT 244  CREATININE 0.80   Estimated Creatinine Clearance: 72 ml/min (by C-G formula based on Cr of 0.8). No results found for this basename: Rolm Gala, VANCORANDOM, GENTTROUGH, GENTPEAK, GENTRANDOM, TOBRATROUGH, TOBRAPEAK, TOBRARND, AMIKACINPEAK, AMIKACINTROU, AMIKACIN,  in the last 72 hours   Microbiology: Recent Results (from the past 720 hour(s))  URINE CULTURE     Status: None   Collection Time    07/04/12  8:27 PM      Result Value Range Status   Specimen Description URINE, RANDOM   Final   Special Requests ADDED 2126   Final   Culture  Setup Time 07/05/2012 02:26   Final   Colony Count 65,000 COLONIES/ML   Final   Culture     Final   Value: GROUP B STREP(S.AGALACTIAE)ISOLATED     Note: TESTING AGAINST S. AGALACTIAE NOT ROUTINELY PERFORMED DUE TO PREDICTABILITY OF AMP/PEN/VAN SUSCEPTIBILITY.   Report Status 07/06/2012 FINAL   Final  CULTURE, BLOOD (ROUTINE X 2)     Status: None   Collection Time    07/05/12  7:50 AM      Result Value Range Status   Specimen Description BLOOD LEFT HAND   Final   Special Requests BOTTLES DRAWN AEROBIC ONLY 10CC   Final   Culture  Setup Time 07/05/2012 13:44   Final   Culture     Final   Value:        BLOOD CULTURE RECEIVED NO GROWTH TO DATE CULTURE WILL BE HELD FOR 5 DAYS BEFORE ISSUING A FINAL NEGATIVE REPORT   Report Status PENDING   Incomplete  CULTURE,  BLOOD (ROUTINE X 2)     Status: None   Collection Time    07/05/12  8:00 AM      Result Value Range Status   Specimen Description BLOOD RIGHT HAND   Final   Special Requests BOTTLES DRAWN AEROBIC ONLY 5CC   Final   Culture  Setup Time 07/05/2012 13:44   Final   Culture     Final   Value:        BLOOD CULTURE RECEIVED NO GROWTH TO DATE CULTURE WILL BE HELD FOR 5 DAYS BEFORE ISSUING A FINAL NEGATIVE REPORT   Report Status PENDING   Incomplete    Medical History: Past Medical History  Diagnosis Date  . Gastritis     HISTORY OF GASTRITIS--OCCAS ABDOMINAL PAIN   . H/O hiatal hernia   . Anxiety   . Complication of anesthesia     "SCRATCHED CORNEA"  WAKING UP FROM  KNEE SURGERY--'82  . Headache     MIGRAINE- INFREQUENT  . Blood in urine     "ALWAYS"  --AND STATES ALWAYS TREATED FOR UTI--BUT HAS NEVER HAD UTI.  Marland Kitchen Arthritis     SEVERE PAIN RIGHT HIP  . Hyperlipidemia     takes Lipitor daily  . History of migraine     takes Topamax  daily and Imitrex prn  . Joint pain   . Joint swelling   . Chronic back pain   . GERD (gastroesophageal reflux disease)     takes Omeprazole daily and states only bc she takes Mobic  . Nocturia   . Depression     PT'S HUSBAND DIED OF CANCER AT [facility] 02/14/2012  . Insomnia     takes Xanax and Ativan prn  . Hypothyroidism 05/13/12    Medications:  Scheduled:  . atorvastatin  20 mg Oral QPM  . buPROPion  300 mg Oral QPM  . [COMPLETED] cefTRIAXone (ROCEPHIN)  IV  1 g Intravenous To OR  . ketorolac  30 mg Intravenous Q6H  . levothyroxine  25 mcg Oral QAC breakfast  . multivitamin with minerals  1 tablet Oral Daily  . pantoprazole  40 mg Oral Daily  . senna  1 tablet Oral BID  . sodium chloride  3 mL Intravenous Q12H  . vancomycin  1,000 mg Intravenous Q12H  . [DISCONTINUED] calcium-vitamin D  2 tablet Oral Daily  . [DISCONTINUED] cefTRIAXone (ROCEPHIN)  IV  2 g Intravenous Q12H  . [DISCONTINUED] topiramate  50 mg Oral QPM  . [DISCONTINUED] vancomycin  (VANCOCIN) 1000 mg IVPB  1,000 mg Intravenous Q12H  . [DISCONTINUED] vancomycin  1,500 mg Intravenous 120 min pre-op   Assessment: 62 yr old female 2 months s/p lumbar decompression and fusion surgery. Pt now has an enlarging painful subcutaneous fluid collection associated with fever. Pt is now post op irrigation and debridement of the abscess. She has a drain in place.  Goal of Therapy:  Vancomycin trough level 10-15 mcg/ml  Plan:  Vancomycin 1 gm q 12 hrs.  Eugene Garnet 07/06/2012,7:09 PM

## 2012-07-06 NOTE — Anesthesia Postprocedure Evaluation (Signed)
  Anesthesia Post-op Note  Patient: Melissa Leach  Procedure(s) Performed: Procedure(s) with comments: WOUND EXPLORATION lumbar (Bilateral) - Incision and Drainage  Patient Location: PACU  Anesthesia Type:General  Level of Consciousness: awake, oriented and patient cooperative  Airway and Oxygen Therapy: Patient Spontanous Breathing  Post-op Pain: mild  Post-op Assessment: Post-op Vital signs reviewed, Patient's Cardiovascular Status Stable, Respiratory Function Stable, Patent Airway, No signs of Nausea or vomiting and Pain level controlled  Post-op Vital Signs: stable  Complications: No apparent anesthesia complications

## 2012-07-06 NOTE — Progress Notes (Signed)
Patient admitted to the hospital with intermittent fevers and increasing lumbar pain and swelling. Denies any radiating pain. Denies any numbness paresthesias or weakness. No history of mental status changes.  On exam she is awake and alert. She is oriented and appropriate. Cranial nerve function intact. Motor and sensory function of the extremities normal. Examination of her lumbar region demonstrates a firm swollen erythematous subcutaneous fluid collection consistent with a subcutaneous abscess. Straight leg raising negative bilaterally. No evidence of meningismus. Examination head ears eyes nose and throat unremarkable. Chest and abdomen benign.  MRI scan demonstrates a large mixed intensity fluid collection in the subcutaneous compartment which does not appear to extend through the fascia. No evidence of intraspinal or epidural process.  Likely subcutaneous abscess approximately 2 months status post lumbar decompression and fusion surgery. Plan to go to the operating room today to reexplore wound for irrigation and debridement and institution of appropriate antibiotics.

## 2012-07-06 NOTE — Brief Op Note (Signed)
07/04/2012 - 07/06/2012  3:40 PM  PATIENT:  Melissa Leach  62 y.o. female  PRE-OPERATIVE DIAGNOSIS:  Lumbar wound infection  POST-OPERATIVE DIAGNOSIS:  Lumbar wound infection  PROCEDURE:  Procedure(s) with comments: WOUND EXPLORATION lumbar (Bilateral) - Incision and Drainage  SURGEON:  Surgeon(s) and Role:    * Temple Pacini, MD - Primary  PHYSICIAN ASSISTANT:   ASSISTANTS: none   ANESTHESIA:   general  EBL:  Total I/O In: 1240 [P.O.:240; I.V.:1000] Out: -   BLOOD ADMINISTERED:none  DRAINS: (Medium large) Hemovact drain(s) in the Subcutaneous space with  Suction Open   LOCAL MEDICATIONS USED:  NONE  SPECIMEN:  No Specimen  DISPOSITION OF SPECIMEN:  N/A  COUNTS:  YES  TOURNIQUET:  * No tourniquets in log *  DICTATION: .Dragon Dictation  PLAN OF CARE: Admit to inpatient   PATIENT DISPOSITION:  PACU - hemodynamically stable.   Delay start of Pharmacological VTE agent (>24hrs) due to surgical blood loss or risk of bleeding: yes

## 2012-07-07 DIAGNOSIS — Y838 Other surgical procedures as the cause of abnormal reaction of the patient, or of later complication, without mention of misadventure at the time of the procedure: Secondary | ICD-10-CM

## 2012-07-07 DIAGNOSIS — T8140XA Infection following a procedure, unspecified, initial encounter: Principal | ICD-10-CM

## 2012-07-07 LAB — SEDIMENTATION RATE: Sed Rate: 50 mm/hr — ABNORMAL HIGH (ref 0–22)

## 2012-07-07 LAB — BASIC METABOLIC PANEL
BUN: 12 mg/dL (ref 6–23)
Chloride: 101 mEq/L (ref 96–112)
GFR calc Af Amer: >90 mL/min (ref >90)
Potassium: 3.4 mEq/L — ABNORMAL LOW (ref 3.5–5.1)
Sodium: 135 mEq/L (ref 135–145)

## 2012-07-07 LAB — C-REACTIVE PROTEIN: CRP: 19 mg/dL — ABNORMAL HIGH (ref <0.60)

## 2012-07-07 MED ORDER — DEXTROSE 5 % IV SOLN
2.0000 g | INTRAVENOUS | Status: DC
  Administered 2012-07-07 – 2012-07-09 (×3): 2 g via INTRAVENOUS
  Filled 2012-07-07 (×3): qty 2

## 2012-07-07 MED ORDER — SODIUM CHLORIDE 0.9 % IJ SOLN
10.0000 mL | INTRAMUSCULAR | Status: DC | PRN
  Administered 2012-07-09: 10 mL

## 2012-07-07 NOTE — Progress Notes (Signed)
Peripherally Inserted Central Catheter/Midline Placement  The IV Nurse has discussed with the patient and/or persons authorized to consent for the patient, the purpose of this procedure and the potential benefits and risks involved with this procedure.  The benefits include less needle sticks, lab draws from the catheter and patient may be discharged home with the catheter.  Risks include, but not limited to, infection, bleeding, blood clot (thrombus formation), and puncture of an artery; nerve damage and irregular heat beat.  Alternatives to this procedure were also discussed.  PICC/Midline Placement Documentation        Mellissa Kohut 07/07/2012, 3:08 PM

## 2012-07-07 NOTE — Progress Notes (Signed)
Postop day 1. Overall feeling good. No new pain or problems.  She is afebrile. Her vitals are stable. Urine output is good. Drain output is low. On exam she is awake and alert. She is oriented and appropriate. Her neck is supple. Straight raising is negative bilaterally. Motor and sensory exam intact.  Cultures isolated gram-positive cocci identification pending.  Continue IV antibiotics and observation. Likely MRSA infection.

## 2012-07-07 NOTE — Consult Note (Signed)
[facility]    Date of Admission:  07/04/2012  Date of Consult:  07/07/2012  Reason for Consult: post-lumbar surgery infection Referring Physician: Dr. Jeral Fruit   HPI: Melissa Leach is an 62 y.o. female with possible history significant for having undergone lumbar fusion proximally 6 weeks ago. She also has a significant orthopedic history for right hip replacement and bilateral knee replacements. Rarely she developed a superficial wound infection that was being treated with doxycycline in February. This then resolved. The past week she developed acute onset of back pain. She was seen in the office of neurosurgery but not felt to have obvious overt evidence of infection at the time. Since then however the last part of her wound apparently became quite edematous and erythematous and she developed fevers up to 102 with positional headaches and malaise and weakness. She is a middle-aged not emergent department transferred to [facility]. MRI done showed a very large fluid collection in the subcutaneous tissues concerning for an abscess posterior minus CSF fluid leak, also peripheral enhancement around the nerve roots operative sites that were thought possibly her present postoperative changes versus possible infection.  Patient was taken the operating room yesterday and had here extensive abscess cavity thoroughly explored and drained. Per the patient the abscess began to actually spontaneous to really drain in the room prior to her going to the operating room. Frank pus was encountered and copiously irrigated and sent for culture which on Gram stain showed gram-positive cocci in pairs. History this ceftriaxone and vancomycin then vancomycin cefepime and now vancomycin alone.  She feels relatively well they'll she still is having some headaches with changes in position mainly moving side to side in her bed occasionally if she were to get out of bed but she has not  been not much postoperatively.   Past Medical History  Diagnosis Date  . Gastritis     HISTORY OF GASTRITIS--OCCAS ABDOMINAL PAIN   . H/O hiatal hernia   . Anxiety   . Complication of anesthesia     "SCRATCHED CORNEA"  WAKING UP FROM  KNEE SURGERY--'82  . Headache     MIGRAINE- INFREQUENT  . Blood in urine     "ALWAYS"  --AND STATES ALWAYS TREATED FOR UTI--BUT HAS NEVER HAD UTI.  Marland Kitchen Arthritis     SEVERE PAIN RIGHT HIP  . Hyperlipidemia     takes Lipitor daily  . History of migraine     takes Topamax daily and Imitrex prn  . Joint pain   . Joint swelling   . Chronic back pain   . GERD (gastroesophageal reflux disease)     takes Omeprazole daily and states only bc she takes Mobic  . Nocturia   . Depression     PT'S HUSBAND DIED OF CANCER AT [facility] 02-16-2012  . Insomnia     takes Xanax and Ativan prn  . Hypothyroidism 05/13/12    Past Surgical History  Procedure Laterality Date  . Joint replacement      RIGHT TOTAL KNEE REPLACEMENT AND RT TOTAL KNEE REVISION    AND  LEFT TOTAL KNEE REPLACEMENT  . Tubal ligation    . Tonsillectomy    . Total hip arthroplasty  06/25/2011    Procedure: TOTAL HIP ARTHROPLASTY ANTERIOR APPROACH;  Surgeon: [name], MD;  Location: [facility];  Service: Orthopedics;  Laterality: Left;  Marland Kitchen Eye surgery  1994    RA surgery-  . Total knee revision  10/28/2011    Procedure: TOTAL KNEE REVISION;  Surgeon: [name], MD;  Location: [facility];  Service: Orthopedics;  Laterality: Right;  . Hematoma evacuation  12/09/2011    Procedure: EVACUATION HEMATOMA;  Surgeon: [name], MD;  Location: [facility];  Service: Orthopedics;  Laterality: Right;  Evacuation of Hematoma Right Knee, I & D and Closed Manipulation of Right Knee  . Irrigation and debridement knee  12/09/2011    Procedure: IRRIGATION AND DEBRIDEMENT KNEE;  Surgeon: [name], MD;  Location: [facility];  Service: Orthopedics;  Laterality: Right;  . Knee closed reduction  12/09/2011    Procedure:  CLOSED MANIPULATION KNEE;  Surgeon: [name], MD;  Location: [facility];  Service: Orthopedics;  Laterality: Right;  . Total hip arthroplasty  03/02/2012    Procedure: TOTAL HIP ARTHROPLASTY ANTERIOR APPROACH;  Surgeon: [name], MD;  Location: [facility];  Service: Orthopedics;  Laterality: Right;  . Colonoscopy    . Esophagogastroduodenoscopy    . Epidural injections      x 2 in the past 4weeks  ergies:   Allergies  Allergen Reactions  . Sulfa Antibiotics Rash     Medications: I have reviewed patients current medications as documented in Epic Anti-infectives   Start     Dose/Rate Route Frequency Ordered Stop   07/07/12 1800  cefTRIAXone (ROCEPHIN) 2 g in dextrose 5 % 50 mL IVPB     2 g 100 mL/hr over 30 Minutes Intravenous Every 24 hours 07/07/12 1522     07/06/12 2200  cefTRIAXone (ROCEPHIN) 2 g in dextrose 5 % 50 mL IVPB  Status:  Discontinued     2 g 100 mL/hr over 30 Minutes Intravenous Every 12 hours 07/06/12 1744 07/06/12 1906   07/06/12 2200  vancomycin (VANCOCIN) IVPB 1000 mg/200 mL premix     1,000 mg 200 mL/hr over 60 Minutes Intravenous Every 12 hours 07/06/12 1558     07/06/12 2200  ceFEPIme (MAXIPIME) 1 g in dextrose 5 % 50 mL IVPB  Status:  Discontinued     1 g 100 mL/hr over 30 Minutes Intravenous 3 times per day 07/06/12 1931 07/07/12 1319   07/06/12 1600  cefTRIAXone (ROCEPHIN) 1 g in dextrose 5 % 50 mL IVPB     1 g 100 mL/hr over 30 Minutes Intravenous To Surgery 07/06/12 1454 07/06/12 1636   07/06/12 1503  bacitracin 50,000 Units in sodium chloride irrigation 0.9 % 500 mL irrigation  Status:  Discontinued       As needed 07/06/12 1504 07/06/12 1548   07/06/12 1500  vancomycin (VANCOCIN) 1,000 mg in sodium chloride 0.9 % 250 mL IVPB  Status:  Discontinued     1,000 mg 250 mL/hr over 60 Minutes Intravenous Every 12 hours 07/06/12 1456 07/06/12 1557   07/06/12 1300  vancomycin (VANCOCIN) 1,500 mg in sodium chloride 0.9 % 500 mL IVPB  Status:  Discontinued       1,500 mg 250 mL/hr over 120 Minutes Intravenous 120 min pre-op 07/06/12 1153 07/06/12 1333      Social History:  reports that she has quit smoking. She quit smokeless tobacco use about 35 years ago. She reports that she does not drink alcohol or use illicit drugs.  No family history on file.  As in HPI and primary teams notes otherwise 12 point review of systems is negative  Blood pressure 94/68, pulse 92, temperature 98.8 F (37.1 C), temperature source Oral, resp. rate 18, height 5\' 1"  (1.549  m), weight 178 lb (80.74 kg), SpO2 99.00%. General: Alert and awake, oriented x3, not in any acute distress. HEENT: anicteric sclera,EOMI, oropharynx clear and without exudate CVS regular rate, normal r,  no murmur rubs or gallops Chest: clear to auscultation bilaterally, no wheezing, rales or rhonchi Abdomen: soft nontender, nondistended, normal bowel sounds, Extremities: no  clubbing or edema noted bilaterally Skin: She has a dressing in place with a wound VAC, and drain Neuro: nonfocal, strength and sensation intact   Results for orders placed during the hospital encounter of 07/04/12 (from the past 48 hour(s))  AFB CULTURE WITH SMEAR     Status: None   Collection Time    07/06/12  3:09 PM      Result Value Range   Specimen Description WOUND BACK     Special Requests LUMBAR WOUND     ACID FAST SMEAR NO ACID FAST BACILLI SEEN     Culture       Value: CULTURE WILL BE EXAMINED FOR 6 WEEKS BEFORE ISSUING A FINAL REPORT   Report Status PENDING    WOUND CULTURE     Status: None   Collection Time    07/06/12  3:09 PM      Result Value Range   Specimen Description WOUND BACK     Special Requests LUMBAR WOUND     Gram Stain       Value: RARE WBC PRESENT, PREDOMINANTLY PMN     NO SQUAMOUS EPITHELIAL CELLS SEEN     RARE GRAM POSITIVE COCCI     IN PAIRS   Culture Culture reincubated for better growth     Report Status PENDING    CBC WITH DIFFERENTIAL     Status: Abnormal   Collection  Time    07/06/12  8:43 PM      Result Value Range   WBC 11.2 (*) 4.0 - 10.5 K/uL   RBC 3.40 (*) 3.87 - 5.11 MIL/uL   Hemoglobin 10.0 (*) 12.0 - 15.0 g/dL   HCT 16.1 (*) 09.6 - 04.5 %   MCV 88.2  78.0 - 100.0 fL   MCH 29.4  26.0 - 34.0 pg   MCHC 33.3  30.0 - 36.0 g/dL   RDW 40.9  81.1 - 91.4 %   Platelets 269  150 - 400 K/uL   Neutrophils Relative 68  43 - 77 %   Neutro Abs 7.6  1.7 - 7.7 K/uL   Lymphocytes Relative 19  12 - 46 %   Lymphs Abs 2.1  0.7 - 4.0 K/uL   Monocytes Relative 11  3 - 12 %   Monocytes Absolute 1.2 (*) 0.1 - 1.0 K/uL   Eosinophils Relative 2  0 - 5 %   Eosinophils Absolute 0.3  0.0 - 0.7 K/uL   Basophils Relative 0  0 - 1 %   Basophils Absolute 0.0  0.0 - 0.1 K/uL  SEDIMENTATION RATE     Status: Abnormal   Collection Time    07/07/12  4:40 AM      Result Value Range   Sed Rate 50 (*) 0 - 22 mm/hr  C-REACTIVE PROTEIN     Status: Abnormal   Collection Time    07/07/12  4:40 AM      Result Value Range   CRP 19.0 (*) <0.60 mg/dL  BASIC METABOLIC PANEL     Status: Abnormal   Collection Time    07/07/12  4:40 AM      Result Value Range  Sodium 135  135 - 145 mEq/L   Potassium 3.4 (*) 3.5 - 5.1 mEq/L   Chloride 101  96 - 112 mEq/L   CO2 25  19 - 32 mEq/L   Glucose, Bld 104 (*) 70 - 99 mg/dL   BUN 12  6 - 23 mg/dL   Creatinine, Ser 1.61  0.50 - 1.10 mg/dL   Calcium 9.3  8.4 - 09.6 mg/dL   GFR calc non Af Amer 89 (*) >90 mL/min   GFR calc Af Amer >90  >90 mL/min   Comment:            The eGFR has been calculated     using the CKD EPI equation.     This calculation has not been     validated in all clinical     situations.     eGFR's persistently     <90 mL/min signify     possible Chronic Kidney Disease.  HIV ANTIBODY (ROUTINE TESTING)     Status: None   Collection Time    07/07/12  4:40 AM      Result Value Range   HIV NON REACTIVE  NON REACTIVE      Component Value Date/Time   SDES WOUND BACK 07/06/2012 1509   SDES WOUND BACK 07/06/2012 1509     SPECREQUEST LUMBAR WOUND 07/06/2012 1509   SPECREQUEST LUMBAR WOUND 07/06/2012 1509   CULT CULTURE WILL BE EXAMINED FOR 6 WEEKS BEFORE ISSUING A FINAL REPORT 07/06/2012 1509   CULT Culture reincubated for better growth 07/06/2012 1509   REPTSTATUS PENDING 07/06/2012 1509   REPTSTATUS PENDING 07/06/2012 1509   No results found.   Recent Results (from the past 720 hour(s))  URINE CULTURE     Status: None   Collection Time    07/04/12  8:27 PM      Result Value Range Status   Specimen Description URINE, RANDOM   Final   Special Requests ADDED 2126   Final   Culture  Setup Time 07/05/2012 02:26   Final   Colony Count 65,000 COLONIES/ML   Final   Culture     Final   Value: GROUP B STREP(S.AGALACTIAE)ISOLATED     Note: TESTING AGAINST S. AGALACTIAE NOT ROUTINELY PERFORMED DUE TO PREDICTABILITY OF AMP/PEN/VAN SUSCEPTIBILITY.   Report Status 07/06/2012 FINAL   Final  CULTURE, BLOOD (ROUTINE X 2)     Status: None   Collection Time    07/05/12  7:50 AM      Result Value Range Status   Specimen Description BLOOD LEFT HAND   Final   Special Requests BOTTLES DRAWN AEROBIC ONLY 10CC   Final   Culture  Setup Time 07/05/2012 13:44   Final   Culture     Final   Value:        BLOOD CULTURE RECEIVED NO GROWTH TO DATE CULTURE WILL BE HELD FOR 5 DAYS BEFORE ISSUING A FINAL NEGATIVE REPORT   Report Status PENDING   Incomplete  CULTURE, BLOOD (ROUTINE X 2)     Status: None   Collection Time    07/05/12  8:00 AM      Result Value Range Status   Specimen Description BLOOD RIGHT HAND   Final   Special Requests BOTTLES DRAWN AEROBIC ONLY 5CC   Final   Culture  Setup Time 07/05/2012 13:44   Final   Culture     Final   Value:        BLOOD CULTURE RECEIVED NO GROWTH  TO DATE CULTURE WILL BE HELD FOR 5 DAYS BEFORE ISSUING A FINAL NEGATIVE REPORT   Report Status PENDING   Incomplete  AFB CULTURE WITH SMEAR     Status: None   Collection Time    07/06/12  3:09 PM      Result Value Range Status   Specimen  Description WOUND BACK   Final   Special Requests LUMBAR WOUND   Final   ACID FAST SMEAR NO ACID FAST BACILLI SEEN   Final   Culture     Final   Value: CULTURE WILL BE EXAMINED FOR 6 WEEKS BEFORE ISSUING A FINAL REPORT   Report Status PENDING   Incomplete  WOUND CULTURE     Status: None   Collection Time    07/06/12  3:09 PM      Result Value Range Status   Specimen Description WOUND BACK   Final   Special Requests LUMBAR WOUND   Final   Gram Stain     Final   Value: RARE WBC PRESENT, PREDOMINANTLY PMN     NO SQUAMOUS EPITHELIAL CELLS SEEN     RARE GRAM POSITIVE COCCI     IN PAIRS   Culture Culture reincubated for better growth   Final   Report Status PENDING   Incomplete     Impression/Recommendation  62 year old with large post lumbar surgical site infection, status post I&D with gram-positive cocci in pairs seen on Gram stain  #1 Post lumbar surgical site infection: Dr. Dutch Quint did not find that this extended beneath the fascia though Radiology felt some of findings on scan could indicate deeper infection.  Her ESR and CRp are also up. CSF leak not felt likely by Neurosugery but positional changes are obviously concerning for this.   --I will add  Back rocephin for better tear subtle activity against streptococcal species and sensitive staph --If she truly has a CSF leak and there is concern for meningitis and I would go with a higher dose of 2 g every 12 hours --Finding continue vancomycin for now case this is instead the MRSA infection. --If this turns out to be staph aureus would then want to add rifampin as well. --I. would treat this patient as a deep infection potential involving hardware with at least 6 weeks of intravenous antibiotics possibly followed by a protracted course of oral antibiotics.  #2 Screening : HIV negative  Dr. Moshe Cipro covering tomorrow and Thursday.  Thank you so much for this interesting consult  [facility]  [facility] [phone] (pager) [phone] (office) 07/07/2012, 7:10 PM  Paulette Blanch Dam 07/07/2012, 7:10 PM

## 2012-07-08 ENCOUNTER — Encounter (HOSPITAL_COMMUNITY): Payer: Self-pay | Admitting: Neurosurgery

## 2012-07-08 MED ORDER — RIFAMPIN 300 MG PO CAPS
300.0000 mg | ORAL_CAPSULE | Freq: Every day | ORAL | Status: DC
  Administered 2012-07-08 – 2012-07-09 (×2): 300 mg via ORAL
  Filled 2012-07-08 (×2): qty 1

## 2012-07-08 NOTE — Progress Notes (Signed)
INFECTIOUS DISEASE PROGRESS NOTE  ID: Melissa Leach is a 62 y.o. female with   Active Problems:   * No active hospital problems. *  Subjective: Without complaints  Abtx:  Anti-infectives   Start     Dose/Rate Route Frequency Ordered Stop   07/07/12 1800  cefTRIAXone (ROCEPHIN) 2 g in dextrose 5 % 50 mL IVPB     2 g 100 mL/hr over 30 Minutes Intravenous Every 24 hours 07/07/12 1522     07/06/12 2200  cefTRIAXone (ROCEPHIN) 2 g in dextrose 5 % 50 mL IVPB  Status:  Discontinued     2 g 100 mL/hr over 30 Minutes Intravenous Every 12 hours 07/06/12 1744 07/06/12 1906   07/06/12 2200  vancomycin (VANCOCIN) IVPB 1000 mg/200 mL premix     1,000 mg 200 mL/hr over 60 Minutes Intravenous Every 12 hours 07/06/12 1558     07/06/12 2200  ceFEPIme (MAXIPIME) 1 g in dextrose 5 % 50 mL IVPB  Status:  Discontinued     1 g 100 mL/hr over 30 Minutes Intravenous 3 times per day 07/06/12 1931 07/07/12 1319   07/06/12 1600  cefTRIAXone (ROCEPHIN) 1 g in dextrose 5 % 50 mL IVPB     1 g 100 mL/hr over 30 Minutes Intravenous To Surgery 07/06/12 1454 07/06/12 1636   07/06/12 1503  bacitracin 50,000 Units in sodium chloride irrigation 0.9 % 500 mL irrigation  Status:  Discontinued       As needed 07/06/12 1504 07/06/12 1548   07/06/12 1500  vancomycin (VANCOCIN) 1,000 mg in sodium chloride 0.9 % 250 mL IVPB  Status:  Discontinued     1,000 mg 250 mL/hr over 60 Minutes Intravenous Every 12 hours 07/06/12 1456 07/06/12 1557   07/06/12 1300  vancomycin (VANCOCIN) 1,500 mg in sodium chloride 0.9 % 500 mL IVPB  Status:  Discontinued     1,500 mg 250 mL/hr over 120 Minutes Intravenous 120 min pre-op 07/06/12 1153 07/06/12 1333      Medications:  Scheduled: . atorvastatin  20 mg Oral QPM  . buPROPion  300 mg Oral QPM  . cefTRIAXone (ROCEPHIN)  IV  2 g Intravenous Q24H  . ketorolac  30 mg Intravenous Q6H  . levothyroxine  25 mcg Oral QAC breakfast  . multivitamin with minerals  1 tablet Oral Daily    . pantoprazole  40 mg Oral Daily  . senna  1 tablet Oral BID  . sodium chloride  3 mL Intravenous Q12H  . vancomycin  1,000 mg Intravenous Q12H    Objective: Vital signs in last 24 hours: Temp:  [97.6 F (36.4 C)-98.8 F (37.1 C)] 98.1 F (36.7 C) (04/09 1000) Pulse Rate:  [80-92] 81 (04/09 1000) Resp:  [18-20] 18 (04/09 1000) BP: (83-94)/(48-68) 83/48 mmHg (04/09 1000) SpO2:  [98 %-100 %] 100 % (04/09 1000)   General appearance: alert, cooperative and no distress Incision/Wound: wound dressed, drain in place  Lab Results  Recent Labs  07/06/12 2043 07/07/12 0440  WBC 11.2*  --   HGB 10.0*  --   HCT 30.0*  --   NA  --  135  K  --  3.4*  CL  --  101  CO2  --  25  BUN  --  12  CREATININE  --  0.77   Liver Panel No results found for this basename: PROT, ALBUMIN, AST, ALT, ALKPHOS, BILITOT, BILIDIR, IBILI,  in the last 72 hours Sedimentation Rate  Recent Labs  07/07/12  0440  ESRSEDRATE 50*   C-Reactive Protein  Recent Labs  07/07/12 0440  CRP 19.0*    Microbiology: Recent Results (from the past 240 hour(s))  URINE CULTURE     Status: None   Collection Time    07/04/12  8:27 PM      Result Value Range Status   Specimen Description URINE, RANDOM   Final   Special Requests ADDED 2126   Final   Culture  Setup Time 07/05/2012 02:26   Final   Colony Count 65,000 COLONIES/ML   Final   Culture     Final   Value: GROUP B STREP(S.AGALACTIAE)ISOLATED     Note: TESTING AGAINST S. AGALACTIAE NOT ROUTINELY PERFORMED DUE TO PREDICTABILITY OF AMP/PEN/VAN SUSCEPTIBILITY.   Report Status 07/06/2012 FINAL   Final  CULTURE, BLOOD (ROUTINE X 2)     Status: None   Collection Time    07/05/12  7:50 AM      Result Value Range Status   Specimen Description BLOOD LEFT HAND   Final   Special Requests BOTTLES DRAWN AEROBIC ONLY 10CC   Final   Culture  Setup Time 07/05/2012 13:44   Final   Culture     Final   Value:        BLOOD CULTURE RECEIVED NO GROWTH TO DATE CULTURE  WILL BE HELD FOR 5 DAYS BEFORE ISSUING A FINAL NEGATIVE REPORT   Report Status PENDING   Incomplete  CULTURE, BLOOD (ROUTINE X 2)     Status: None   Collection Time    07/05/12  8:00 AM      Result Value Range Status   Specimen Description BLOOD RIGHT HAND   Final   Special Requests BOTTLES DRAWN AEROBIC ONLY 5CC   Final   Culture  Setup Time 07/05/2012 13:44   Final   Culture     Final   Value:        BLOOD CULTURE RECEIVED NO GROWTH TO DATE CULTURE WILL BE HELD FOR 5 DAYS BEFORE ISSUING A FINAL NEGATIVE REPORT   Report Status PENDING   Incomplete  AFB CULTURE WITH SMEAR     Status: None   Collection Time    07/06/12  3:09 PM      Result Value Range Status   Specimen Description WOUND BACK   Final   Special Requests LUMBAR WOUND   Final   ACID FAST SMEAR NO ACID FAST BACILLI SEEN   Final   Culture     Final   Value: CULTURE WILL BE EXAMINED FOR 6 WEEKS BEFORE ISSUING A FINAL REPORT   Report Status PENDING   Incomplete  WOUND CULTURE     Status: None   Collection Time    07/06/12  3:09 PM      Result Value Range Status   Specimen Description WOUND BACK   Final   Special Requests LUMBAR WOUND   Final   Gram Stain     Final   Value: RARE WBC PRESENT, PREDOMINANTLY PMN     NO SQUAMOUS EPITHELIAL CELLS SEEN     RARE GRAM POSITIVE COCCI     IN PAIRS   Culture     Final   Value: MODERATE GROUP B STREP(S.AGALACTIAE)ISOLATED     Note: TESTING AGAINST S. AGALACTIAE NOT ROUTINELY PERFORMED DUE TO PREDICTABILITY OF AMP/PEN/VAN SUSCEPTIBILITY.   Report Status PENDING   Incomplete    Studies/Results: No results found.   Assessment/Plan: Wound infection/abscess  Group B strep Lumbar fusion (L4-5, L5-S1)  05-15-12 with hardware ? CSF leak  Total days of antibiotics: 3 (vanco/ceftriaxone)  Will change her anbx to ceftriaxone and rifampin Will check LFTs, advised pt of rifampin ADRs Plan for long term (6 weeks) Has PIC    Melissa Leach Infect[facility] Diseases [phone] 07/08/2012,  2:06 PM   LOS: 4 days

## 2012-07-08 NOTE — Progress Notes (Signed)
[facility]  Patient Status: New  [facility] is providing the following services: RN and Home Infusion Services (teaching and education will be done by nurse in the home with patient and caregiver)  If patient discharges after hours, please call [phone].   Wynelle Bourgeois 07/08/2012, 3:03 PM

## 2012-07-08 NOTE — Progress Notes (Signed)
Stable, as per dr Jordan Likes.

## 2012-07-08 NOTE — Care Management Note (Signed)
    Page 1 of 1   07/08/2012     4:58:00 PM   CARE MANAGEMENT NOTE 07/08/2012  Patient:  MERRI, DIMAANO   Account Number:  [id number]  Date Initiated:  07/06/2012  Documentation initiated by:  [facility]  Subjective/Objective Assessment:   admitted with possible subcutaneous abscess at surgical wound from previous lumbar decompression and fusion     Action/Plan:   plan I and D of wound   Anticipated DC Date:  10/08/2012   Anticipated DC Plan:  HOME W HOME HEALTH SERVICES      DC Planning Services  CM consult      Choice offered to / List presented to:  C-1 Patient        HH arranged  HH-1 RN  IV Antibiotics      HH agency  [facility]   Status of service:  In process, will continue to follow Medicare Important Message given?   (If response is "NO", the following Medicare IM given date fields will be blank) Date Medicare IM given:   Date Additional Medicare IM given:    Discharge Disposition:    Per UR Regulation:  Reviewed for med. necessity/level of care/duration of stay  If discussed at Long Length of Stay Meetings, dates discussed:    Comments:  07/08/12 Spoke with patient about home IV antibiotics. Patient is agreeable to learning to give herself IV antibiotics. She chose [facility]. Contacted Marie at [employer] and set home IV antibiotics and HHRN, orders pending. Will continue to follow. Jacquelynn Cree RN, BSN, CCM

## 2012-07-08 NOTE — Progress Notes (Signed)
Postop day 2. I appreciate the infectious disease consult and agree with their findings and recommendations.  Overall she is feeling better. Complains of some back soreness but no radicular pain or other problems.  She is afebrile. Vitals are stable. Neurological exam is stable. Dressing is dry. Back is mildly tender. Drain output reasonably low.  Overall doing well. Cultures still have not revealed definitive organism did continue vancomycin and Rocephin for now. Mobilize as tolerated. We'll keep as inpatient until treatment plan completely elucidated.

## 2012-07-09 DIAGNOSIS — Z981 Arthrodesis status: Secondary | ICD-10-CM

## 2012-07-09 LAB — COMPREHENSIVE METABOLIC PANEL
Alkaline Phosphatase: 121 U/L — ABNORMAL HIGH (ref 39–117)
BUN: 14 mg/dL (ref 6–23)
Chloride: 107 mEq/L (ref 96–112)
Creatinine, Ser: 0.8 mg/dL (ref 0.50–1.10)
GFR calc Af Amer: >90 mL/min (ref >90)
Glucose, Bld: 97 mg/dL (ref 70–99)
Potassium: 4.2 mEq/L (ref 3.5–5.1)
Total Bilirubin: 0.1 mg/dL — ABNORMAL LOW (ref 0.3–1.2)
Total Protein: 6 g/dL (ref 6.0–8.3)

## 2012-07-09 MED ORDER — HEPARIN SOD (PORK) LOCK FLUSH 100 UNIT/ML IV SOLN
250.0000 [IU] | INTRAVENOUS | Status: AC | PRN
  Administered 2012-07-09: 250 [IU]

## 2012-07-09 NOTE — Progress Notes (Signed)
Pt discharged. Discharged instructions given and pt verbalized understanding.

## 2012-07-09 NOTE — Discharge Summary (Signed)
Physician Discharge Summary  Patient ID: Melissa Leach MRN: [id number] DOB/AGE: 1951-01-29 62 y.o.  Admit date: 07/04/2012 Discharge date: 07/09/2012  Admission Diagnoses: lumbar wound infection    Discharge Diagnoses: same   Discharged Condition: good  Hospital Course: The patient was admitted on 07/04/2012 after MRI showed SQ fluid collection concerning for infection. S/P 2 level PLIF 8 weeks ago. Seen in office with pristine wound just 4 days prior to presentation.  Began having low grade fevers and swelling at incision site. Admitted and taken to the operating room where the patient underwent I & D lumbar wound. ID consulted and pt started on empiric Abx. The patient tolerated the procedure well and was taken to the recovery room and then to the floor in stable condition. The hospital course was routine. There were no complications. The wound remained clean dry and intact. Pt had appropriate back soreness. No complaints of leg pain or new N/T/W. The patient remained afebrile with stable vital signs, and tolerated a regular diet. The patient continued to increase activities, and pain was well controlled with oral pain medications. Cxs grew Group B strep and ID put her on Rifampin and Rocephin. HH arranged.  Consults: ID  Significant Diagnostic Studies:  Results for orders placed during the hospital encounter of 07/04/12  URINE CULTURE      Result Value Range   Specimen Description URINE, RANDOM     Special Requests ADDED 2126     Culture  Setup Time 07/05/2012 02:26     Colony Count 65,000 COLONIES/ML     Culture       Value: GROUP B STREP(S.AGALACTIAE)ISOLATED     Note: TESTING AGAINST S. AGALACTIAE NOT ROUTINELY PERFORMED DUE TO PREDICTABILITY OF AMP/PEN/VAN SUSCEPTIBILITY.   Report Status 07/06/2012 FINAL    CULTURE, BLOOD (ROUTINE X 2)      Result Value Range   Specimen Description BLOOD LEFT HAND     Special Requests BOTTLES DRAWN AEROBIC ONLY 10CC     Culture  Setup Time  07/05/2012 13:44     Culture       Value:        BLOOD CULTURE RECEIVED NO GROWTH TO DATE CULTURE WILL BE HELD FOR 5 DAYS BEFORE ISSUING A FINAL NEGATIVE REPORT   Report Status PENDING    CULTURE, BLOOD (ROUTINE X 2)      Result Value Range   Specimen Description BLOOD RIGHT HAND     Special Requests BOTTLES DRAWN AEROBIC ONLY 5CC     Culture  Setup Time 07/05/2012 13:44     Culture       Value:        BLOOD CULTURE RECEIVED NO GROWTH TO DATE CULTURE WILL BE HELD FOR 5 DAYS BEFORE ISSUING A FINAL NEGATIVE REPORT   Report Status PENDING    AFB CULTURE WITH SMEAR      Result Value Range   Specimen Description WOUND BACK     Special Requests LUMBAR WOUND     ACID FAST SMEAR NO ACID FAST BACILLI SEEN     Culture       Value: CULTURE WILL BE EXAMINED FOR 6 WEEKS BEFORE ISSUING A FINAL REPORT   Report Status PENDING    WOUND CULTURE      Result Value Range   Specimen Description WOUND BACK     Special Requests LUMBAR WOUND     Gram Stain       Value: RARE WBC PRESENT, PREDOMINANTLY PMN  NO SQUAMOUS EPITHELIAL CELLS SEEN     RARE GRAM POSITIVE COCCI     IN PAIRS   Culture       Value: MODERATE GROUP B STREP(S.AGALACTIAE)ISOLATED     Note: TESTING AGAINST S. AGALACTIAE NOT ROUTINELY PERFORMED DUE TO PREDICTABILITY OF AMP/PEN/VAN SUSCEPTIBILITY.   Report Status 07/09/2012 FINAL    URINALYSIS, ROUTINE W REFLEX MICROSCOPIC      Result Value Range   Color, Urine YELLOW  YELLOW   APPearance CLOUDY (*) CLEAR   Specific Gravity, Urine 1.018  1.005 - 1.030   pH 5.5  5.0 - 8.0   Glucose, UA NEGATIVE  NEGATIVE mg/dL   Hgb urine dipstick MODERATE (*) NEGATIVE   Bilirubin Urine NEGATIVE  NEGATIVE   Ketones, ur 40 (*) NEGATIVE mg/dL   Protein, ur NEGATIVE  NEGATIVE mg/dL   Urobilinogen, UA 0.2  0.0 - 1.0 mg/dL   Nitrite NEGATIVE  NEGATIVE   Leukocytes, UA SMALL (*) NEGATIVE  CBC WITH DIFFERENTIAL      Result Value Range   WBC 11.7 (*) 4.0 - 10.5 K/uL   RBC 3.85 (*) 3.87 - 5.11 MIL/uL    Hemoglobin 11.7 (*) 12.0 - 15.0 g/dL   HCT 16.1 (*) 09.6 - 04.5 %   MCV 88.8  78.0 - 100.0 fL   MCH 30.4  26.0 - 34.0 pg   MCHC 34.2  30.0 - 36.0 g/dL   RDW 40.9  81.1 - 91.4 %   Platelets 244  150 - 400 K/uL   Neutrophils Relative 72  43 - 77 %   Neutro Abs 8.4 (*) 1.7 - 7.7 K/uL   Lymphocytes Relative 15  12 - 46 %   Lymphs Abs 1.7  0.7 - 4.0 K/uL   Monocytes Relative 13 (*) 3 - 12 %   Monocytes Absolute 1.5 (*) 0.1 - 1.0 K/uL   Eosinophils Relative 0  0 - 5 %   Eosinophils Absolute 0.0  0.0 - 0.7 K/uL   Basophils Relative 0  0 - 1 %   Basophils Absolute 0.0  0.0 - 0.1 K/uL  BASIC METABOLIC PANEL      Result Value Range   Sodium 135  135 - 145 mEq/L   Potassium 3.3 (*) 3.5 - 5.1 mEq/L   Chloride 102  96 - 112 mEq/L   CO2 20  19 - 32 mEq/L   Glucose, Bld 99  70 - 99 mg/dL   BUN 14  6 - 23 mg/dL   Creatinine, Ser 7.82  0.50 - 1.10 mg/dL   Calcium 9.9  8.4 - 95.6 mg/dL   GFR calc non Af Amer 79 (*) >90 mL/min   GFR calc Af Amer >90  >90 mL/min  URINE MICROSCOPIC-ADD ON      Result Value Range   Squamous Epithelial / LPF FEW (*) RARE   WBC, UA 3-6  <3 WBC/hpf   RBC / HPF 3-6  <3 RBC/hpf   Bacteria, UA FEW (*) RARE  SEDIMENTATION RATE      Result Value Range   Sed Rate 50 (*) 0 - 22 mm/hr  C-REACTIVE PROTEIN      Result Value Range   CRP 19.0 (*) <0.60 mg/dL  CBC WITH DIFFERENTIAL      Result Value Range   WBC 11.2 (*) 4.0 - 10.5 K/uL   RBC 3.40 (*) 3.87 - 5.11 MIL/uL   Hemoglobin 10.0 (*) 12.0 - 15.0 g/dL   HCT 21.3 (*) 08.6 - 57.8 %  MCV 88.2  78.0 - 100.0 fL   MCH 29.4  26.0 - 34.0 pg   MCHC 33.3  30.0 - 36.0 g/dL   RDW 30.8  65.7 - 84.6 %   Platelets 269  150 - 400 K/uL   Neutrophils Relative 68  43 - 77 %   Neutro Abs 7.6  1.7 - 7.7 K/uL   Lymphocytes Relative 19  12 - 46 %   Lymphs Abs 2.1  0.7 - 4.0 K/uL   Monocytes Relative 11  3 - 12 %   Monocytes Absolute 1.2 (*) 0.1 - 1.0 K/uL   Eosinophils Relative 2  0 - 5 %   Eosinophils Absolute 0.3  0.0 -  0.7 K/uL   Basophils Relative 0  0 - 1 %   Basophils Absolute 0.0  0.0 - 0.1 K/uL  BASIC METABOLIC PANEL      Result Value Range   Sodium 135  135 - 145 mEq/L   Potassium 3.4 (*) 3.5 - 5.1 mEq/L   Chloride 101  96 - 112 mEq/L   CO2 25  19 - 32 mEq/L   Glucose, Bld 104 (*) 70 - 99 mg/dL   BUN 12  6 - 23 mg/dL   Creatinine, Ser 9.62  0.50 - 1.10 mg/dL   Calcium 9.3  8.4 - 95.2 mg/dL   GFR calc non Af Amer 89 (*) >90 mL/min   GFR calc Af Amer >90  >90 mL/min  HIV ANTIBODY (ROUTINE TESTING)      Result Value Range   HIV NON REACTIVE  NON REACTIVE  COMPREHENSIVE METABOLIC PANEL      Result Value Range   Sodium 141  135 - 145 mEq/L   Potassium 4.2  3.5 - 5.1 mEq/L   Chloride 107  96 - 112 mEq/L   CO2 27  19 - 32 mEq/L   Glucose, Bld 97  70 - 99 mg/dL   BUN 14  6 - 23 mg/dL   Creatinine, Ser 8.41  0.50 - 1.10 mg/dL   Calcium 9.4  8.4 - 32.4 mg/dL   Total Protein 6.0  6.0 - 8.3 g/dL   Albumin 2.6 (*) 3.5 - 5.2 g/dL   AST 19  0 - 37 U/L   ALT 20  0 - 35 U/L   Alkaline Phosphatase 121 (*) 39 - 117 U/L   Total Bilirubin 0.1 (*) 0.3 - 1.2 mg/dL   GFR calc non Af Amer 79 (*) >90 mL/min   GFR calc Af Amer >90  >90 mL/min    Mr Lumbar Spine W Wo Contrast  07/04/2012  *RADIOLOGY REPORT*  Clinical Data: Postop fever. Fusion 05/15/2012.  MRI LUMBAR SPINE WITHOUT AND WITH CONTRAST  Technique:  Multiplanar and multiecho pulse sequences of the lumbar spine were obtained without and with intravenous contrast.  Contrast: 17mL MULTIHANCE GADOBENATE DIMEGLUMINE 529 MG/ML IV SOLN  Comparison: 05/05/2012.  Findings: Last fully open disc space is labeled L5-S1.  Present examination incorporates from T12 through the lower sacrum.  Conus L2 level.  Interval L4-5 and L5-S1 fusion utilizing pedicle screws and posterior connecting bar with interbody spacer.  Decrease in degree of anterior slip of L4 and L5.  11.2 x 5.2 x 5.5 cm postoperative complex posterior subcutaneous fluid collection extends from the upper  L3 to the upper S3 level containing a fluid-fluid level (which may represent blood given the patient's decreased hematocrit) and demonstrate peripheral enhancement.  Findings suggestive of postoperative leak/infection (given the low grade fever  and slightly elevated white count).  Peripheral enhancement of the thecal sac and nerve roots at the surgical site. Mild enhancement of portions of the interbody spacer.  Although this may represent expected postoperative changes, infection not excluded.  T12-L1 minimal bulge.  L2 hemangioma.  L3-4:  Mild bulge greater to the right.  Mild to moderate facet joint degenerative changes.  IMPRESSION: Post recent L4-5 and L5-S1 fusion as detailed above with large postoperative fluid collection within the subcutaneous region extending from the upper L3 to the upper S1 level with peripheral enhancement and fluid fluid level suspicious for postoperative leak/infection possibly containing blood.  Please see above for further detail.  Critical Value/emergent results were called by telephone at the time of interpretation on 07/04/2012 at 6:10 p.m. to [facility] patient's nurse, who verbally acknowledged these results. Dr. Jeral Fruit to be contacted by [facility] patient's nurse .   Original Report Authenticated By: Lacy Duverney, M.D.     Antibiotics:  Anti-infectives   Start     Dose/Rate Route Frequency Ordered Stop   07/08/12 1530  rifampin (RIFADIN) capsule 300 mg     300 mg Oral Daily 07/08/12 1421     07/07/12 1800  cefTRIAXone (ROCEPHIN) 2 g in dextrose 5 % 50 mL IVPB     2 g 100 mL/hr over 30 Minutes Intravenous Every 24 hours 07/07/12 1522     07/06/12 2200  cefTRIAXone (ROCEPHIN) 2 g in dextrose 5 % 50 mL IVPB  Status:  Discontinued     2 g 100 mL/hr over 30 Minutes Intravenous Every 12 hours 07/06/12 1744 07/06/12 1906   07/06/12 2200  vancomycin (VANCOCIN) IVPB 1000 mg/200 mL premix  Status:  Discontinued     1,000 mg 200 mL/hr over 60 Minutes Intravenous Every 12 hours  07/06/12 1558 07/08/12 1421   07/06/12 2200  ceFEPIme (MAXIPIME) 1 g in dextrose 5 % 50 mL IVPB  Status:  Discontinued     1 g 100 mL/hr over 30 Minutes Intravenous 3 times per day 07/06/12 1931 07/07/12 1319   07/06/12 1600  cefTRIAXone (ROCEPHIN) 1 g in dextrose 5 % 50 mL IVPB     1 g 100 mL/hr over 30 Minutes Intravenous To Surgery 07/06/12 1454 07/06/12 1636   07/06/12 1503  bacitracin 50,000 Units in sodium chloride irrigation 0.9 % 500 mL irrigation  Status:  Discontinued       As needed 07/06/12 1504 07/06/12 1548   07/06/12 1500  vancomycin (VANCOCIN) 1,000 mg in sodium chloride 0.9 % 250 mL IVPB  Status:  Discontinued     1,000 mg 250 mL/hr over 60 Minutes Intravenous Every 12 hours 07/06/12 1456 07/06/12 1557   07/06/12 1300  vancomycin (VANCOCIN) 1,500 mg in sodium chloride 0.9 % 500 mL IVPB  Status:  Discontinued     1,500 mg 250 mL/hr over 120 Minutes Intravenous 120 min pre-op 07/06/12 1153 07/06/12 1333      Discharge Exam: Blood pressure 97/38, pulse 79, temperature 97.9 F (36.6 C), temperature source Oral, resp. rate 18, height 5\' 1"  (1.549 m), weight 80.74 kg (178 lb), SpO2 100.00%. Neurologic: Grossly normal Dressing dry  Discharge Medications:     Medication List    TAKE these medications       ALPRAZolam 0.5 MG tablet  Commonly known as:  XANAX  Take 0.25 mg by mouth at bedtime as needed for sleep.     atorvastatin 40 MG tablet  Commonly known as:  LIPITOR  Take 20 mg by mouth  every evening.     buPROPion 300 MG 24 hr tablet  Commonly known as:  WELLBUTRIN XL  Take 300 mg by mouth every evening.     calcium-vitamin D 500-200 MG-UNIT per tablet  Commonly known as:  OSCAL WITH D  Take 2 tablets by mouth daily.     levothyroxine 25 MCG tablet  Commonly known as:  SYNTHROID, LEVOTHROID  Take 25 mcg by mouth daily.     LORazepam 0.5 MG tablet  Commonly known as:  ATIVAN  Take 0.5 mg by mouth every 8 (eight) hours as needed for anxiety.      multivitamin with minerals Tabs  Take 1 tablet by mouth daily.     omeprazole 40 MG capsule  Commonly known as:  PRILOSEC  Take 40 mg by mouth every evening.     SUMAtriptan 100 MG tablet  Commonly known as:  IMITREX  Take 100 mg by mouth every 2 (two) hours as needed for migraine.     topiramate 50 MG tablet  Commonly known as:  TOPAMAX  Take 100 mg by mouth every evening.        Disposition: home with HH abx for 6 weeks (RX provided)   Final Dx: lumbar wound infection      Discharge Orders   Future Orders Complete By Expires     Call MD for:  difficulty breathing, headache or visual disturbances  As directed     Call MD for:  hives  As directed     Call MD for:  persistant nausea and vomiting  As directed     Call MD for:  redness, tenderness, or signs of infection (pain, swelling, redness, odor or green/yellow discharge around incision site)  As directed     Call MD for:  severe uncontrolled pain  As directed     Call MD for:  temperature >100.4  As directed     Diet - low sodium heart healthy  As directed     Discharge instructions  As directed     Comments:      empty Hemovac once a day, keep dressing dry    Increase activity slowly  As directed        Follow-up Information   Follow up with Carlee Vonderhaar S, MD. Schedule an appointment as soon as possible for a visit in 2 weeks.   Contact information:   [address] [place] [address] [phone]        Signed: Tia Alert 07/09/2012, 4:06 PM

## 2012-07-09 NOTE — Progress Notes (Signed)
INFECTIOUS DISEASE PROGRESS NOTE  ID: Melissa Leach is a 62 y.o. female with  Active Problems:   * No active hospital problems. *  Subjective: No complaints, no rash, no diarrhea, no yeast infections.   Abtx:  Anti-infectives   Start     Dose/Rate Route Frequency Ordered Stop   07/08/12 1530  rifampin (RIFADIN) capsule 300 mg     300 mg Oral Daily 07/08/12 1421     07/07/12 1800  cefTRIAXone (ROCEPHIN) 2 g in dextrose 5 % 50 mL IVPB     2 g 100 mL/hr over 30 Minutes Intravenous Every 24 hours 07/07/12 1522     07/06/12 2200  cefTRIAXone (ROCEPHIN) 2 g in dextrose 5 % 50 mL IVPB  Status:  Discontinued     2 g 100 mL/hr over 30 Minutes Intravenous Every 12 hours 07/06/12 1744 07/06/12 1906   07/06/12 2200  vancomycin (VANCOCIN) IVPB 1000 mg/200 mL premix  Status:  Discontinued     1,000 mg 200 mL/hr over 60 Minutes Intravenous Every 12 hours 07/06/12 1558 07/08/12 1421   07/06/12 2200  ceFEPIme (MAXIPIME) 1 g in dextrose 5 % 50 mL IVPB  Status:  Discontinued     1 g 100 mL/hr over 30 Minutes Intravenous 3 times per day 07/06/12 1931 07/07/12 1319   07/06/12 1600  cefTRIAXone (ROCEPHIN) 1 g in dextrose 5 % 50 mL IVPB     1 g 100 mL/hr over 30 Minutes Intravenous To Surgery 07/06/12 1454 07/06/12 1636   07/06/12 1503  bacitracin 50,000 Units in sodium chloride irrigation 0.9 % 500 mL irrigation  Status:  Discontinued       As needed 07/06/12 1504 07/06/12 1548   07/06/12 1500  vancomycin (VANCOCIN) 1,000 mg in sodium chloride 0.9 % 250 mL IVPB  Status:  Discontinued     1,000 mg 250 mL/hr over 60 Minutes Intravenous Every 12 hours 07/06/12 1456 07/06/12 1557   07/06/12 1300  vancomycin (VANCOCIN) 1,500 mg in sodium chloride 0.9 % 500 mL IVPB  Status:  Discontinued     1,500 mg 250 mL/hr over 120 Minutes Intravenous 120 min pre-op 07/06/12 1153 07/06/12 1333      Medications:  Scheduled: . atorvastatin  20 mg Oral QPM  . buPROPion  300 mg Oral QPM  . cefTRIAXone  (ROCEPHIN)  IV  2 g Intravenous Q24H  . ketorolac  30 mg Intravenous Q6H  . levothyroxine  25 mcg Oral QAC breakfast  . multivitamin with minerals  1 tablet Oral Daily  . pantoprazole  40 mg Oral Daily  . rifampin  300 mg Oral Daily  . senna  1 tablet Oral BID  . sodium chloride  3 mL Intravenous Q12H    Objective: Vital signs in last 24 hours: Temp:  [97.7 F (36.5 C)-98.4 F (36.9 C)] 98.1 F (36.7 C) (04/10 0635) Pulse Rate:  [73-83] 83 (04/10 0635) Resp:  [17-20] 20 (04/10 0635) BP: (90-99)/(46-54) 93/51 mmHg (04/10 0635) SpO2:  [95 %-100 %] 98 % (04/10 0635)   General appearance: alert and no distress Resp: clear to auscultation bilaterally Cardio: regular rate and rhythm GI: normal findings: bowel sounds normal and soft, non-tender  Lab Results  Recent Labs  07/06/12 2043 07/07/12 0440 07/09/12 0610  WBC 11.2*  --   --   HGB 10.0*  --   --   HCT 30.0*  --   --   NA  --  135 141  K  --  3.4* 4.2  CL  --  101 107  CO2  --  25 27  BUN  --  12 14  CREATININE  --  0.77 0.80   Liver Panel  Recent Labs  07/09/12 0610  PROT 6.0  ALBUMIN 2.6*  AST 19  ALT 20  ALKPHOS 121*  BILITOT 0.1*   Sedimentation Rate  Recent Labs  07/07/12 0440  ESRSEDRATE 50*   C-Reactive Protein  Recent Labs  07/07/12 0440  CRP 19.0*    Microbiology: Recent Results (from the past 240 hour(s))  URINE CULTURE     Status: None   Collection Time    07/04/12  8:27 PM      Result Value Range Status   Specimen Description URINE, RANDOM   Final   Special Requests ADDED 2126   Final   Culture  Setup Time 07/05/2012 02:26   Final   Colony Count 65,000 COLONIES/ML   Final   Culture     Final   Value: GROUP B STREP(S.AGALACTIAE)ISOLATED     Note: TESTING AGAINST S. AGALACTIAE NOT ROUTINELY PERFORMED DUE TO PREDICTABILITY OF AMP/PEN/VAN SUSCEPTIBILITY.   Report Status 07/06/2012 FINAL   Final  CULTURE, BLOOD (ROUTINE X 2)     Status: None   Collection Time    07/05/12   7:50 AM      Result Value Range Status   Specimen Description BLOOD LEFT HAND   Final   Special Requests BOTTLES DRAWN AEROBIC ONLY 10CC   Final   Culture  Setup Time 07/05/2012 13:44   Final   Culture     Final   Value:        BLOOD CULTURE RECEIVED NO GROWTH TO DATE CULTURE WILL BE HELD FOR 5 DAYS BEFORE ISSUING A FINAL NEGATIVE REPORT   Report Status PENDING   Incomplete  CULTURE, BLOOD (ROUTINE X 2)     Status: None   Collection Time    07/05/12  8:00 AM      Result Value Range Status   Specimen Description BLOOD RIGHT HAND   Final   Special Requests BOTTLES DRAWN AEROBIC ONLY 5CC   Final   Culture  Setup Time 07/05/2012 13:44   Final   Culture     Final   Value:        BLOOD CULTURE RECEIVED NO GROWTH TO DATE CULTURE WILL BE HELD FOR 5 DAYS BEFORE ISSUING A FINAL NEGATIVE REPORT   Report Status PENDING   Incomplete  AFB CULTURE WITH SMEAR     Status: None   Collection Time    07/06/12  3:09 PM      Result Value Range Status   Specimen Description WOUND BACK   Final   Special Requests LUMBAR WOUND   Final   ACID FAST SMEAR NO ACID FAST BACILLI SEEN   Final   Culture     Final   Value: CULTURE WILL BE EXAMINED FOR 6 WEEKS BEFORE ISSUING A FINAL REPORT   Report Status PENDING   Incomplete  WOUND CULTURE     Status: None   Collection Time    07/06/12  3:09 PM      Result Value Range Status   Specimen Description WOUND BACK   Final   Special Requests LUMBAR WOUND   Final   Gram Stain     Final   Value: RARE WBC PRESENT, PREDOMINANTLY PMN     NO SQUAMOUS EPITHELIAL CELLS SEEN     RARE GRAM POSITIVE COCCI  IN PAIRS   Culture     Final   Value: MODERATE GROUP B STREP(S.AGALACTIAE)ISOLATED     Note: TESTING AGAINST S. AGALACTIAE NOT ROUTINELY PERFORMED DUE TO PREDICTABILITY OF AMP/PEN/VAN SUSCEPTIBILITY.   Report Status PENDING   Incomplete    Studies/Results: No results found.   Assessment/Plan: Wound infection/abscess  Group B strep  Lumbar fusion (L4-5, L5-S1)  05-15-12 with hardware  ? CSF leak   Total days of antibiotics: 4 (ceftriaxone, rifampin)  Continue current anbx Will see her back in ID clinic in 2-3 weeks Plan for long term (6 weeks)    Johny Sax Infectious Diseases [phone] 07/09/2012, 10:23 AM   LOS: 5 days

## 2012-07-11 LAB — CULTURE, BLOOD (ROUTINE X 2): Culture: NO GROWTH

## 2012-07-21 ENCOUNTER — Encounter: Payer: Self-pay | Admitting: Internal Medicine

## 2012-07-21 ENCOUNTER — Ambulatory Visit (INDEPENDENT_AMBULATORY_CARE_PROVIDER_SITE_OTHER): Payer: 59 | Admitting: Internal Medicine

## 2012-07-21 ENCOUNTER — Telehealth: Payer: Self-pay

## 2012-07-21 VITALS — Wt 181.0 lb

## 2012-07-21 DIAGNOSIS — IMO0001 Reserved for inherently not codable concepts without codable children: Secondary | ICD-10-CM

## 2012-07-21 DIAGNOSIS — T8149XA Infection following a procedure, other surgical site, initial encounter: Secondary | ICD-10-CM | POA: Insufficient documentation

## 2012-07-21 DIAGNOSIS — Z5189 Encounter for other specified aftercare: Secondary | ICD-10-CM

## 2012-07-21 NOTE — Assessment & Plan Note (Addendum)
She is healing well and I will have her continue with the Rocephin and rifampin through May 18 2 complete 6 weeks. She has had some difficulty with her PICC line does work in well. I did offer to have a change to her other arm though this time she is going to continue his PICC in her current arm but will call if she is having difficulty and would prefer to have a change to her left arm.  She will return to see her primary provider towards the end of treatment  She was concerned with the knot in her back though feels consistent with scar tissue underneath. It is not fluctuant there is no erythema or warmth. The incision is healing well and only has a scab on top but otherwise is nontender.

## 2012-07-21 NOTE — Progress Notes (Signed)
  Subjective:    Patient ID: Melissa Leach, female    DOB: 1950/12/25, 62 y.o.   MRN: [id number]  HPI She comes in here for followup of her hospitalization. She has a history of knee and hip replacements and spinal stenosis and she underwent lumbar fusion in February of 2014. After surgery, in April, she noted more back pain and fever. She was evaluated and admitted for observation and noted an abscess that started to drain.  On April 7, she underwent debridement for an abscess and wound infection and her culture grew out group B streptococcus. She was then discharged on April 10 with a projected regimen of Rocephin and rifampin. She comes in here for followup which was scheduled a little early because of her concern of a "knot" on her back. She does has had no fever or chills. She does have some occasional pain with her PICC line but no drainage from her PICC line. She did see Dr. Yetta Barre of neurosurgery and did not have any concerns with her wound.   Review of Systems  Constitutional: Negative for fever and chills.  Gastrointestinal: Negative for nausea, vomiting and diarrhea.  Musculoskeletal: Negative for back pain.       Some pain in her arm at the site of her PICC line with movement  Neurological: Negative for dizziness, light-headedness and headaches.       Objective:   Physical Exam        Assessment & Plan:

## 2012-07-21 NOTE — Telephone Encounter (Signed)
Pt called stating she is having increased pain with a "knott" in her back.  She is requesting a sooner appointment.  Pt was moved to a different physician so she could be seen today.    Laurell Josephs, RN

## 2012-07-27 ENCOUNTER — Inpatient Hospital Stay: Payer: 59 | Admitting: Infectious Diseases

## 2012-08-05 ENCOUNTER — Telehealth: Payer: Self-pay | Admitting: *Deleted

## 2012-08-05 NOTE — Telephone Encounter (Signed)
Patient called, reporting fever over night of 100.4 and 99 during the day. Endorses achy joints, states that she is feeling "just like she did before she went to the ED."  Pt would like to be seen as soon as possible.  Was seen in [facility] by N[facility] and [facility]; last seen in office by Dr. Luciana Axe on 07/21/12.  First available appointment 08/06/12 with Dr. Luciana Axe given. Andree Coss, RN

## 2012-08-06 ENCOUNTER — Encounter: Payer: Self-pay | Admitting: Internal Medicine

## 2012-08-06 ENCOUNTER — Ambulatory Visit (INDEPENDENT_AMBULATORY_CARE_PROVIDER_SITE_OTHER): Payer: 59 | Admitting: Internal Medicine

## 2012-08-06 VITALS — BP 121/76 | HR 97 | Temp 98.3°F | Ht 61.0 in | Wt 179.0 lb

## 2012-08-06 DIAGNOSIS — IMO0001 Reserved for inherently not codable concepts without codable children: Secondary | ICD-10-CM

## 2012-08-06 DIAGNOSIS — Z5189 Encounter for other specified aftercare: Secondary | ICD-10-CM

## 2012-08-06 LAB — COMPLETE METABOLIC PANEL WITH GFR
ALT: 138 U/L — ABNORMAL HIGH (ref 0–35)
AST: 94 U/L — ABNORMAL HIGH (ref 0–37)
Albumin: 4.3 g/dL (ref 3.5–5.2)
Alkaline Phosphatase: 230 U/L — ABNORMAL HIGH (ref 39–117)
Potassium: 3.8 mEq/L (ref 3.5–5.3)
Sodium: 137 mEq/L (ref 135–145)
Total Bilirubin: 0.4 mg/dL (ref 0.3–1.2)
Total Protein: 6.7 g/dL (ref 6.0–8.3)

## 2012-08-06 LAB — CBC WITH DIFFERENTIAL/PLATELET
Basophils Relative: 2 % — ABNORMAL HIGH (ref 0–1)
Eosinophils Absolute: 0.4 10*3/uL (ref 0.0–0.7)
Eosinophils Relative: 9 % — ABNORMAL HIGH (ref 0–5)
HCT: 35 % — ABNORMAL LOW (ref 36.0–46.0)
Hemoglobin: 11.6 g/dL — ABNORMAL LOW (ref 12.0–15.0)
MCH: 29.3 pg (ref 26.0–34.0)
MCHC: 33.1 g/dL (ref 30.0–36.0)
MCV: 88.4 fL (ref 78.0–100.0)
Monocytes Absolute: 0.3 10*3/uL (ref 0.1–1.0)
Monocytes Relative: 6 % (ref 3–12)
Neutro Abs: 3.3 10*3/uL (ref 1.7–7.7)
RDW: 14.9 % (ref 11.5–15.5)

## 2012-08-06 LAB — SEDIMENTATION RATE: Sed Rate: 28 mm/hr — ABNORMAL HIGH (ref 0–22)

## 2012-08-06 NOTE — Assessment & Plan Note (Signed)
Doing well with her current antibiotics.  She does have multiple subjective complaints along with fever to 100.4.  PICC line looks good, but she continues to complain of pain.  I will check CBC, CMP, CRP, ESR, blood culture.  If all looks ok, she can continue her projected 10 more days of antibiotics.

## 2012-08-06 NOTE — Progress Notes (Signed)
  Subjective:    Patient ID: Melissa Leach, female    DOB: Mar 24, 1951, 62 y.o.   MRN: [id number]  HPI She comes in here for a work in visit. She has a history of knee and hip replacements and spinal stenosis and she underwent lumbar fusion in February of 2014. After surgery, in April, she noted more back pain and fever. She was evaluated and admitted for observation and noted an abscess that started to drain. On April 7, she underwent debridement for an abscess and wound infection and her culture grew out group B streptococcus. She was then discharged on April 10 with a projected regimen of Rocephin and rifampin. She comes in here for followup which was scheduled a little early because of her concern of a "knot" on her back. She does has had no fever or chills. She does have some occasional pain with her PICC line but no drainage from her PICC line. She did see Dr. Yetta Barre of neurosurgery and did not have any concerns with her wound.    Review of Systems  Constitutional: Positive for fever, chills, activity change, appetite change and fatigue.  Respiratory: Negative for cough and shortness of breath.   Cardiovascular: Negative for chest pain and leg swelling.  Gastrointestinal: Negative for nausea, abdominal pain and diarrhea.  Musculoskeletal: Positive for arthralgias. Negative for myalgias, joint swelling and gait problem.  Skin: Negative for rash.  Neurological: Negative for dizziness, light-headedness and headaches.  Hematological: Negative for adenopathy.  Psychiatric/Behavioral: The patient is not nervous/anxious.        Objective:   Physical Exam  Constitutional: She appears well-developed and well-nourished.  Cardiovascular: Normal rate, regular rhythm and normal heart sounds.  Exam reveals no gallop and no friction rub.   No murmur heard. Pulmonary/Chest: Effort normal and breath sounds normal. No respiratory distress. She has no wheezes. She has no rales.  Musculoskeletal: She  exhibits no edema.  Skin: No rash noted.          Assessment & Plan:

## 2012-08-07 ENCOUNTER — Telehealth: Payer: Self-pay | Admitting: *Deleted

## 2012-08-07 ENCOUNTER — Other Ambulatory Visit: Payer: Self-pay | Admitting: Internal Medicine

## 2012-08-07 DIAGNOSIS — K810 Acute cholecystitis: Secondary | ICD-10-CM

## 2012-08-07 NOTE — Telephone Encounter (Signed)
Message copied by Macy Mis on Fri Aug 07, 2012 11:57 AM ------      Message from: Gardiner Barefoot      Created: Fri Aug 07, 2012 10:54 AM       It looks like she is getting cholecystitis from the Rocephin which could cause her symptoms, which is a rare side effect.  I would like to stop the Rocephin, stop the rifampin and start ampicillin 2 grams every 4 hours per home health protocol.  Her CRP and ESR are much better but still up so I would like to continue treatment.          Also, I will order an ultrasound of abdomen, see if she can get it today.  Thanks ------

## 2012-08-07 NOTE — Telephone Encounter (Signed)
Patient and Home Health notified, Ultrasound will have to be next week as patient needs to be NPO for 8 hours and test needs be be prior authorized. Melissa Leach

## 2012-08-10 ENCOUNTER — Telehealth: Payer: Self-pay | Admitting: *Deleted

## 2012-08-10 NOTE — Telephone Encounter (Signed)
Called and left patient a voice mail her ultrasound is scheduled for 08/12/12 at 8:45 AM, NPO after midnight. [facility]. Wendall Mola CMA

## 2012-08-11 ENCOUNTER — Encounter: Payer: Self-pay | Admitting: Internal Medicine

## 2012-08-11 ENCOUNTER — Telehealth: Payer: Self-pay | Admitting: *Deleted

## 2012-08-11 ENCOUNTER — Ambulatory Visit (INDEPENDENT_AMBULATORY_CARE_PROVIDER_SITE_OTHER): Payer: 59 | Admitting: Internal Medicine

## 2012-08-11 VITALS — BP 102/68 | HR 91 | Temp 98.0°F | Ht 61.0 in | Wt 183.0 lb

## 2012-08-11 DIAGNOSIS — IMO0001 Reserved for inherently not codable concepts without codable children: Secondary | ICD-10-CM

## 2012-08-11 DIAGNOSIS — Z5189 Encounter for other specified aftercare: Secondary | ICD-10-CM

## 2012-08-11 DIAGNOSIS — K8020 Calculus of gallbladder without cholecystitis without obstruction: Secondary | ICD-10-CM | POA: Insufficient documentation

## 2012-08-11 DIAGNOSIS — R7401 Elevation of levels of liver transaminase levels: Secondary | ICD-10-CM

## 2012-08-11 NOTE — Progress Notes (Signed)
  Subjective:    Patient ID: Melissa Leach, female    DOB: Jun 09, 1950, 62 y.o.   MRN: [id number]  HPI She comes in for followup of her postoperative wound infection. She has a history of knee and hip replacements and spinal stenosis and she underwent lumbar fusion in February of 2014. After surgery, in April, she noted more back pain and fever. She was evaluated and admitted for observation and noted an abscess that started to drain. On April 7, she underwent debridement for an abscess and wound infection and her culture grew out group B streptococcus. She was then discharged on April 10 with a projected regimen of Rocephin and rifampin.  She came in following her admission and was doing well however I saw her last week for a work in visit with a fever up to 100.4 and some vague complaints. Evaluation including labs revealed an increased AST, ALT and alkaline phosphatase. I therefore changed her Rocephin 2 ampicillin with concern for biliary problems with her Rocephin. She is scheduled for an ultrasound tomorrow to look for biliary stones. She is scheduled to complete her antibiotics in one week. Her CRP and sedimentation rate have both decreased compared to previous and this is in light of having acute illness. Today she feels much better than before and not having any fever or significant complaints. She is on ampicillin via pump.    Review of Systems  Constitutional: Negative for fever, appetite change and fatigue.  Gastrointestinal: Negative for nausea, abdominal pain and diarrhea.  Skin: Negative for rash.  Neurological: Negative for dizziness and headaches.       Objective:   Physical Exam  Constitutional: She appears well-developed and well-nourished. No distress.  Cardiovascular: Normal rate, regular rhythm and normal heart sounds.  Exam reveals no gallop and no friction rub.   No murmur heard.         Assessment & Plan:

## 2012-08-11 NOTE — Assessment & Plan Note (Addendum)
I suspect she has biliary stones or sludge from the Rocephin. I will check the ultrasound and if there are stones, this will be monitored as there is a possibility that the stones will be reabsorbed since this is due to Rocephin. However if this does persist she may need evaluation by surgery. She knows that if she gets acute abdominal pain that is not resolved that she will need to be evaluated in the emergency room or by a surgeon.  Addendum: her u/s is positive for gallstones.  This is likely due to ceftriaxone and therefore is reversible.  I will repeat the ultrasound in about 1 month to check for resolution.  If no change, I will send her to surgery.  She was told to call or go to the ED if she develops significant abdominal pain.

## 2012-08-11 NOTE — Telephone Encounter (Signed)
Verbal order given to [facility] pharmacist, [name] to continue IV antibiotics for another 2 weeks per Dr. Luciana Axe. Melissa Leach

## 2012-08-11 NOTE — Assessment & Plan Note (Addendum)
He is doing well and I will have her continue to complete her projected 6 week course with ampicillin which will have good coverage for group B streptococcus. She will get the ultrasound tomorrow.  Addendum 5/14: Her picc line dislodged.  She is close to completing treatment and will d/c antibiotics.

## 2012-08-12 ENCOUNTER — Ambulatory Visit (HOSPITAL_COMMUNITY)
Admission: RE | Admit: 2012-08-12 | Discharge: 2012-08-12 | Disposition: A | Discharge status: Home / Self Care | Payer: 59 | Source: Ambulatory Visit | Attending: Internal Medicine | Admitting: Internal Medicine

## 2012-08-12 ENCOUNTER — Encounter: Payer: Self-pay | Admitting: Infectious Diseases

## 2012-08-12 ENCOUNTER — Telehealth: Payer: Self-pay | Admitting: *Deleted

## 2012-08-12 DIAGNOSIS — K810 Acute cholecystitis: Secondary | ICD-10-CM

## 2012-08-12 DIAGNOSIS — K8020 Calculus of gallbladder without cholecystitis without obstruction: Secondary | ICD-10-CM | POA: Insufficient documentation

## 2012-08-12 NOTE — Telephone Encounter (Signed)
Pt left message.  Accidentally dislodged the PICC line this AM.  Pt did not say whether it was totally out.  She is on her way to [facility] for scheduled abdominal U/S this morning.  RN left message on pt's cell phone to call her [facility] RN to come to the home to evaluate the PICC line.  If there is something that [facility] needs to do for the PICC problem the [facility] RN can call [facility].

## 2012-08-12 NOTE — Telephone Encounter (Signed)
She was close enough to stop.  Let [facility] now that she can stop her antibiotics.

## 2012-08-12 NOTE — Telephone Encounter (Signed)
Dr. Luciana Axe d/ced the pt's IV antibiotics due to near d/c date and the pt accidentally pulled her PICC line out this morning.  Melissa, pharmacist, repeated back.

## 2012-08-18 ENCOUNTER — Ambulatory Visit (INDEPENDENT_AMBULATORY_CARE_PROVIDER_SITE_OTHER): Payer: 59 | Admitting: Internal Medicine

## 2012-08-18 ENCOUNTER — Encounter: Payer: Self-pay | Admitting: Internal Medicine

## 2012-08-18 VITALS — BP 117/78 | HR 74 | Temp 98.1°F | Ht 61.0 in | Wt 181.0 lb

## 2012-08-18 DIAGNOSIS — K8020 Calculus of gallbladder without cholecystitis without obstruction: Secondary | ICD-10-CM

## 2012-08-18 DIAGNOSIS — IMO0001 Reserved for inherently not codable concepts without codable children: Secondary | ICD-10-CM

## 2012-08-18 DIAGNOSIS — Z5189 Encounter for other specified aftercare: Secondary | ICD-10-CM

## 2012-08-18 LAB — CBC WITH DIFFERENTIAL/PLATELET
Eosinophils Absolute: 0.2 10*3/uL (ref 0.0–0.7)
HCT: 34.5 % — ABNORMAL LOW (ref 36.0–46.0)
Hemoglobin: 11.6 g/dL — ABNORMAL LOW (ref 12.0–15.0)
Lymphs Abs: 1.3 10*3/uL (ref 0.7–4.0)
MCH: 29.5 pg (ref 26.0–34.0)
Monocytes Absolute: 0.4 10*3/uL (ref 0.1–1.0)
Monocytes Relative: 10 % (ref 3–12)
Neutrophils Relative %: 55 % (ref 43–77)
RBC: 3.93 MIL/uL (ref 3.87–5.11)

## 2012-08-18 LAB — COMPLETE METABOLIC PANEL WITH GFR
Albumin: 4.3 g/dL (ref 3.5–5.2)
CO2: 26 mEq/L (ref 19–32)
GFR, Est African American: >89 mL/min
GFR, Est Non African American: 78 mL/min
Glucose, Bld: 91 mg/dL (ref 70–99)
Potassium: 4.9 mEq/L (ref 3.5–5.3)
Sodium: 138 mEq/L (ref 135–145)
Total Bilirubin: 0.4 mg/dL (ref 0.3–1.2)
Total Protein: 6.7 g/dL (ref 6.0–8.3)

## 2012-08-18 NOTE — Assessment & Plan Note (Signed)
She has had an adequate course of antibiotics for her postoperative wound infection and this is now stable.

## 2012-08-18 NOTE — Progress Notes (Signed)
  Subjective:    Patient ID: Melissa Leach, female    DOB: 04-22-1950, 62 y.o.   MRN: [id number]  HPI He comes in here for followup of her postoperative wound infection as well as gallstones. She developed a low-grade fever at home previously and she had abdominal pain and elevated transaminases including alkaline phosphatase, and she was sent for ultrasound which shows gallstones. She does not have any history of gallstones that she knows of. She does have chronic abdominal pain that is midepigastric. She otherwise has not had any problems since her last episode of fever. She was on ceftriaxone which I suspect is the cause of her gallstones. No recent fever. Her antibiotics were changed to ampicillin and she was going to complete another 2 weeks though one week ago, her PICC line came out accidentally and had stopped her antibiotics. Her inflammatory markers had greatly improved and she was feeling better and her back is doing well with no further fever or chills.   Review of Systems  Constitutional: Negative for fever, chills and fatigue.  Gastrointestinal: Negative for nausea, abdominal pain and diarrhea.  Musculoskeletal: Positive for arthralgias.  Skin: Negative for rash.  Neurological: Negative for dizziness.       Objective:   Physical Exam  Constitutional: She appears well-developed and well-nourished. No distress.  Cardiovascular: Normal rate, regular rhythm and normal heart sounds.   No murmur heard. Pulmonary/Chest: Effort normal and breath sounds normal. No respiratory distress. She has no wheezes.  Abdominal: Soft. Bowel sounds are normal. She exhibits no distension. There is no tenderness. There is no rebound and no guarding.  Skin: Skin is warm and dry. No rash noted.  Psychiatric: She has a normal mood and affect. Her behavior is normal.          Assessment & Plan:

## 2012-08-18 NOTE — Assessment & Plan Note (Signed)
I suspect that gallstones are due to her ceftriaxone. I therefore hope that they will dissolve off the medications. I am going to have her repeat the ultrasound in about 3 or 4 weeks to see if they have dissolved. If they are persistent or minimally changed, I will have her sent to surgery for evaluation. She knows that if she develops significant abdominal pain that is not resolved, she will need to go to the emergency room or call her primary physician or myself to evaluate for gallstones and need for more urgent surgical evaluation.

## 2012-08-19 ENCOUNTER — Encounter: Payer: Self-pay | Admitting: Infectious Diseases

## 2012-08-19 ENCOUNTER — Telehealth: Payer: Self-pay | Admitting: *Deleted

## 2012-08-19 LAB — AFB CULTURE WITH SMEAR (NOT AT ARMC): Acid Fast Smear: NONE SEEN

## 2012-08-19 NOTE — Telephone Encounter (Signed)
Appointment made at [facility] for f/u abdominal ultrasound Monday 09/14/12 at 9:30 (pt to arrive at 9:15), NPO after midnight. Pt notified. Andree Coss, RN

## 2012-08-31 ENCOUNTER — Encounter: Payer: Self-pay | Admitting: Infectious Diseases

## 2012-09-03 ENCOUNTER — Encounter: Payer: Self-pay | Admitting: Infectious Diseases

## 2012-09-14 ENCOUNTER — Ambulatory Visit (HOSPITAL_COMMUNITY)
Admission: RE | Admit: 2012-09-14 | Discharge: 2012-09-14 | Disposition: A | Discharge status: Home / Self Care | Payer: 59 | Source: Ambulatory Visit | Attending: Internal Medicine | Admitting: Internal Medicine

## 2012-09-14 DIAGNOSIS — K8020 Calculus of gallbladder without cholecystitis without obstruction: Secondary | ICD-10-CM

## 2012-09-17 ENCOUNTER — Ambulatory Visit (INDEPENDENT_AMBULATORY_CARE_PROVIDER_SITE_OTHER): Payer: 59 | Admitting: Internal Medicine

## 2012-09-17 ENCOUNTER — Encounter: Payer: Self-pay | Admitting: Internal Medicine

## 2012-09-17 VITALS — BP 91/60 | HR 91 | Temp 98.1°F | Ht 61.0 in | Wt 185.0 lb

## 2012-09-17 DIAGNOSIS — K8020 Calculus of gallbladder without cholecystitis without obstruction: Secondary | ICD-10-CM

## 2012-09-17 NOTE — Assessment & Plan Note (Addendum)
These have essentially disappeared and therefore I think this was due to ceftriaxone and off the medications have resolved. No indication for evaluation by surgery at this time. She does know to be evaluated she does have significant abdominal pain. She will return on a p.r.n. Basis.

## 2012-09-17 NOTE — Progress Notes (Signed)
  Subjective:    Patient ID: Melissa Leach, female    DOB: 1950-10-09, 62 y.o.   MRN: [id number]  HPI He comes in here for followup of her postoperative wound infection as well as gallstones. She developed a low-grade fever at home previously and she had abdominal pain and elevated transaminases including alkaline phosphatase, and she was sent for ultrasound which shows gallstones. She does not have any history of gallstones that she knows of. She does have chronic abdominal pain that is midepigastric. She otherwise has not had any problems since her last episode of fever. She was on ceftriaxone which I suspect is the cause of her gallstones. No recent fever. Her antibiotics were changed to ampicillin and she was going to complete another 2 weeks though one week ago, her PICC line came out accidentally and had stopped her antibiotics. Her inflammatory markers had greatly improved and she was feeling better and her back is doing well with no further fever or chills. She is now here for followup of her gallstones. I did repeat her ultrasound about one month after her previous ultrasound showing gallstones. Her 1.4 cm stone has essentially disappeared, she does have some sludge in minimal stone but no significant stones and no ductal dilatation. She has had no problems with no abdominal pain or difficulty with eating. No weight loss, diarrhea or other issues.   Review of Systems  Constitutional: Negative for fatigue.  Gastrointestinal: Negative for nausea, vomiting, abdominal pain, diarrhea and abdominal distention.  Neurological: Negative for dizziness and headaches.       Objective:   Physical Exam  Constitutional: She appears well-developed and well-nourished. No distress.  Cardiovascular: Normal rate, regular rhythm and normal heart sounds.   No murmur heard. Pulmonary/Chest: Effort normal and breath sounds normal. No respiratory distress. She has no wheezes.          Assessment & Plan:

## 2012-11-03 ENCOUNTER — Encounter: Payer: Self-pay | Admitting: Infectious Diseases

## 2013-01-18 ENCOUNTER — Encounter: Payer: Self-pay | Admitting: Nurse Practitioner

## 2013-01-19 ENCOUNTER — Encounter: Payer: Self-pay | Admitting: Nurse Practitioner

## 2013-01-19 ENCOUNTER — Ambulatory Visit (INDEPENDENT_AMBULATORY_CARE_PROVIDER_SITE_OTHER): Payer: 59 | Admitting: Nurse Practitioner

## 2013-01-19 VITALS — BP 110/62 | HR 80 | Resp 16 | Ht 61.0 in | Wt 191.0 lb

## 2013-01-19 DIAGNOSIS — L259 Unspecified contact dermatitis, unspecified cause: Secondary | ICD-10-CM

## 2013-01-19 DIAGNOSIS — L280 Lichen simplex chronicus: Secondary | ICD-10-CM

## 2013-01-19 DIAGNOSIS — N952 Postmenopausal atrophic vaginitis: Secondary | ICD-10-CM

## 2013-01-19 DIAGNOSIS — Z01419 Encounter for gynecological examination (general) (routine) without abnormal findings: Secondary | ICD-10-CM

## 2013-01-19 MED ORDER — BETAMETHASONE VALERATE 0.1 % EX OINT: TOPICAL_OINTMENT | Freq: Two times a day (BID) | CUTANEOUS | Status: DC

## 2013-01-19 NOTE — Patient Instructions (Signed)

## 2013-01-19 NOTE — Progress Notes (Signed)
Patient ID: Melissa Leach, female   DOB: [birth year], 62 y.o.   MRN: [id number] 62 y.o. [id number] Widowed Caucasian Fe here for annual exam.  Patient has had a multiple of health issues since last here.  She had 6 surgeries from 3/13- 4/14.  Right Total knee, right hip, and lumbar spine with 3 disc fusion. Then had re-exploration of spine secondary to infection. Debridement and irrigation of right knee after surgery with closed manipulation.  Husband also who had stomach cancer became worse and no response to chemo and radiation and died 02/16/2012.  She also moved from the condo to a new home during one of the hospital stays with her husband.  No LMP recorded. Patient is postmenopausal.          Sexually active: no  The current method of family planning is abstinence.    Exercising: no  The patient does not participate in regular exercise at present. Smoker:  Former, quit over 25 years ago  Health Maintenance: Pap: 12/30/11, WNL, neg HR HPV MMG: 03/26/12, BI-RADS 1: negative Colonoscopy: 2009, repeat in 10 years BMD: 03/26/12, low bone mass TDaP: 07/2009 Labs: HB: PCP  Urine: PCP   reports that she quit smoking about 36 years ago. She does not have any smokeless tobacco history on file. She reports that she does not drink alcohol or use illicit drugs.  Past Medical History  Diagnosis Date  . Gastritis     HISTORY OF GASTRITIS--OCCAS ABDOMINAL PAIN   . H/O hiatal hernia   . Anxiety   . Complication of anesthesia     "SCRATCHED CORNEA"  WAKING UP FROM  KNEE SURGERY--'82  . Headache(784.0)     MIGRAINE- INFREQUENT  . Blood in urine     "ALWAYS"  --AND STATES ALWAYS TREATED FOR UTI--BUT HAS NEVER HAD UTI.  Marland Kitchen Arthritis     SEVERE PAIN RIGHT HIP  . Hyperlipidemia     takes Lipitor daily  . History of migraine     takes Topamax daily and Imitrex prn  . Joint pain   . Joint swelling   . Chronic back pain   . GERD (gastroesophageal reflux disease)     takes Omeprazole daily and states only  bc she takes Mobic  . Nocturia   . Depression     PT'S HUSBAND DIED OF CANCER AT [facility] 02-16-12  . Insomnia     takes Xanax and Ativan prn  . Hypothyroidism 05/13/12  . Anemia     Past Surgical History  Procedure Laterality Date  . Joint replacement      RIGHT TOTAL KNEE REPLACEMENT AND RT TOTAL KNEE REVISION    AND  LEFT TOTAL KNEE REPLACEMENT  . Tubal ligation    . Tonsillectomy    . Total hip arthroplasty  06/25/2011    Procedure: TOTAL HIP ARTHROPLASTY ANTERIOR APPROACH;  Surgeon: [name], MD;  Location: [facility];  Service: Orthopedics;  Laterality: Left;  Marland Kitchen Eye surgery  1994    RA surgery-  . Total knee revision  10/28/2011    Procedure: TOTAL KNEE REVISION;  Surgeon: [name], MD;  Location: [facility];  Service: Orthopedics;  Laterality: Right;  . Hematoma evacuation  12/09/2011    Procedure: EVACUATION HEMATOMA;  Surgeon: [name], MD;  Location: [facility];  Service: Orthopedics;  Laterality: Right;  Evacuation of Hematoma Right Knee, I & D and Closed Manipulation of Right Knee  . Irrigation and debridement knee  12/09/2011  Procedure: IRRIGATION AND DEBRIDEMENT KNEE;  Surgeon: [name], MD;  Location: [facility];  Service: Orthopedics;  Laterality: Right;  . Knee closed reduction  12/09/2011    Procedure: CLOSED MANIPULATION KNEE;  Surgeon: [name], MD;  Location: [facility];  Service: Orthopedics;  Laterality: Right;  . Total hip arthroplasty  03/02/2012    Procedure: TOTAL HIP ARTHROPLASTY ANTERIOR APPROACH;  Surgeon: [name], MD;  Location: [facility];  Service: Orthopedics;  Laterality: Right;  . Colonoscopy    . Esophagogastroduodenoscopy    . Epidural injections      x 2 in the past 4weeks  . Wound exploration Bilateral 07/06/2012    Procedure: WOUND EXPLORATION lumbar;  Surgeon: Temple Pacini, MD;  Location: [facility];  Service: Neurosurgery;  Laterality: Bilateral;  Incision and Drainage    Current Outpatient Prescriptions  Medication Sig Dispense  Refill  . ALPRAZolam (XANAX) 0.5 MG tablet Take 0.25 mg by mouth at bedtime as needed for sleep.       Marland Kitchen atorvastatin (LIPITOR) 40 MG tablet Take 20 mg by mouth every evening.       Marland Kitchen buPROPion (WELLBUTRIN XL) 300 MG 24 hr tablet Take 300 mg by mouth every evening.      . calcium-vitamin D (OSCAL WITH D) 500-200 MG-UNIT per tablet Take 2 tablets by mouth daily.       Marland Kitchen levothyroxine (SYNTHROID, LEVOTHROID) 25 MCG tablet Take 25 mcg by mouth daily.      Marland Kitchen LORazepam (ATIVAN) 0.5 MG tablet Take 0.5 mg by mouth every 8 (eight) hours as needed for anxiety.       . meloxicam (MOBIC) 15 MG tablet Take 15 mg by mouth daily.      . Multiple Vitamin (MULTIVITAMIN WITH MINERALS) TABS Take 1 tablet by mouth daily.      Marland Kitchen omeprazole (PRILOSEC) 40 MG capsule Take 40 mg by mouth every evening.      . rifampin (RIFADIN) 300 MG capsule Take 300 mg by mouth daily.      . SUMAtriptan (IMITREX) 100 MG tablet Take 100 mg by mouth every 2 (two) hours as needed for migraine.       . topiramate (TOPAMAX) 50 MG tablet Take 100 mg by mouth every evening.       No current facility-administered medications for this visit.    Family History  Problem Relation Age of Onset  . Diabetes Mother     ROS:  Pertinent items are noted in HPI.  Otherwise, a comprehensive ROS was negative.  Exam:   BP 110/62  Pulse 80  Resp 16  Ht 5\' 1"  (1.549 m)  Wt 191 lb (86.637 kg)  BMI 36.11 kg/m2 Height: 5\' 1"  (154.9 cm)  Ht Readings from Last 3 Encounters:  01/19/13 5\' 1"  (1.549 m)  09/17/12 5\' 1"  (1.549 m)  08/18/12 5\' 1"  (1.549 m)    General appearance: alert, cooperative and appears stated age Head: Normocephalic, without obvious abnormality, atraumatic Neck: no adenopathy, supple, symmetrical, trachea midline and thyroid normal to inspection and palpation Lungs: clear to auscultation bilaterally Breasts: normal appearance, no masses or tenderness Heart: regular rate and rhythm Abdomen: soft, non-tender; no masses,  no  organomegaly Extremities: extremities normal, atraumatic, no cyanosis or edema Skin: Skin color, texture, turgor normal. No rashes or lesions Lymph nodes: Cervical, supraclavicular, and axillary nodes normal. No abnormal inguinal nodes palpated Neurologic: Grossly normal   Pelvic: External genitalia:  no lesions  Urethra:  normal appearing urethra with no masses, tenderness or lesions              Bartholin's and Skene's: normal                 Vagina: normal appearing vagina with normal color and discharge, no lesions              Cervix: anteverted              Pap taken: no Bimanual Exam:  Uterus:  normal size, contour, position, consistency, mobility, non-tender              Adnexa: no mass, fullness, tenderness               Rectovaginal: Confirms               Anus:  normal sphincter tone, no lesions  A:  Well Woman with normal exam  S/P multiple surgeries this past year  Grief reaction to husbands death February 09, 2012  History of atrophic vulvitis with lichenoid vulvitis proven biopsy 12/05  Postmenopausal off HRT since 05/2005  Osteopenia followed by PCP  P:   Pap smear as per guidelines Not done today  Mammogram due 12/14  Does not need a refill on Diprolene ointment at this time - got recent refill from PCP.  Will call us back when refill is needed.  Counseled on breast self exam, adequate intake of calcium and vitamin D, diet and exercise return annually or prn  An After Visit Summary was printed and given to the patient.

## 2013-01-21 NOTE — Progress Notes (Signed)
Encounter reviewed by Dr. Brook Silva.  

## 2013-02-04 ENCOUNTER — Other Ambulatory Visit: Payer: Self-pay

## 2014-01-31 ENCOUNTER — Encounter: Payer: Self-pay | Admitting: Nurse Practitioner

## 2014-02-07 ENCOUNTER — Ambulatory Visit
Admission: RE | Admit: 2014-02-07 | Discharge: 2014-02-07 | Disposition: A | Discharge status: Home / Self Care | Payer: 59 | Source: Ambulatory Visit | Attending: Family Medicine | Admitting: Family Medicine

## 2014-02-07 ENCOUNTER — Other Ambulatory Visit: Payer: Self-pay | Admitting: Family Medicine

## 2014-02-07 DIAGNOSIS — M25511 Pain in right shoulder: Secondary | ICD-10-CM

## 2014-03-29 ENCOUNTER — Telehealth: Payer: Self-pay | Admitting: Nurse Practitioner

## 2014-03-29 NOTE — Telephone Encounter (Signed)
Please let patient know BMD results from 03/21/14.  She may have already been contacted by Dr. Mila PalmerSharon Wolters - PCP.   She has had bilateral hip and knee replacements. The T Score: spine -1.3; the left radius is -0.3.  The lowest at the spine falls into the low normal range.  Comparison studies from 03/26/12 reveal a decrease at the left forearm- not considered significant. The spine shows a 5.5 % increase which is not significant and most likely is degenerative changes.  FRAX score is not done.  Have patient to get in more upper body exercises with weight training at least 3 times a week.  Continue with calcium and Vit D.

## 2014-03-31 NOTE — Telephone Encounter (Signed)
I have attempted to contact this patient by phone with the following results: left message to return my call on answering machine (home).  [phone]

## 2014-04-20 ENCOUNTER — Ambulatory Visit
Admission: RE | Admit: 2014-04-20 | Discharge: 2014-04-20 | Disposition: A | Discharge status: Home / Self Care | Payer: PRIVATE HEALTH INSURANCE | Source: Ambulatory Visit | Attending: Family Medicine | Admitting: Family Medicine

## 2014-04-20 ENCOUNTER — Other Ambulatory Visit: Payer: Self-pay | Admitting: Family Medicine

## 2014-04-20 DIAGNOSIS — R05 Cough: Secondary | ICD-10-CM

## 2014-04-20 DIAGNOSIS — R059 Cough, unspecified: Secondary | ICD-10-CM

## 2014-04-25 NOTE — Telephone Encounter (Signed)
I have attempted to contact this patient by phone with the following results: left message to return my call on answering machine [facility]  No personal information given.  [phone] (Home)

## 2014-05-09 ENCOUNTER — Ambulatory Visit (INDEPENDENT_AMBULATORY_CARE_PROVIDER_SITE_OTHER): Payer: PRIVATE HEALTH INSURANCE | Admitting: Nurse Practitioner

## 2014-05-09 ENCOUNTER — Encounter: Payer: Self-pay | Admitting: Nurse Practitioner

## 2014-05-09 VITALS — BP 116/86 | HR 82 | Ht 61.0 in | Wt 194.0 lb

## 2014-05-09 DIAGNOSIS — Z0000 Encounter for general adult medical examination without abnormal findings: Secondary | ICD-10-CM

## 2014-05-09 DIAGNOSIS — Z01419 Encounter for gynecological examination (general) (routine) without abnormal findings: Secondary | ICD-10-CM

## 2014-05-09 LAB — POCT URINALYSIS DIPSTICK
Bilirubin, UA: NEGATIVE
Blood, UA: NEGATIVE
Glucose, UA: NEGATIVE
KETONES UA: NEGATIVE
Leukocytes, UA: NEGATIVE
Protein, UA: NEGATIVE
Urobilinogen, UA: NEGATIVE

## 2014-05-09 NOTE — Patient Instructions (Signed)

## 2014-05-09 NOTE — Progress Notes (Signed)
64 y.o. [id number] Widowed white Fe here for annual exam.  recent problems with URI and bronchitis. She was treated with prednisone, inhaler, and Z pack early December.  Frequent coughing and still has rib pain.  She is also having problems with GERD. Still drives to [place] to care for father and step mother.  Patient's last menstrual period was 12/27/2002.          Sexually active: No.  The current method of family planning is post menopausal status.    Exercising: Yes.    Home exercise routine includes walking daily hrs per days. Smoker:  no  Health Maintenance: Pap:  12/30/11 , Normal, negative HR HPV MMG: 05/03/14 normal Colonoscopy: 2009 repeat in 10 years, IFOB 11/15 negative BMD:  03/21/14 Spine -1.3; forearm left was normal TDaP:  07/2009 Shingles: 2014 Labs: done at PCP, urine negative   reports that she quit smoking about 37 years ago. She does not have any smokeless tobacco history on file. She reports that she does not drink alcohol or use illicit drugs.  Past Medical History  Diagnosis Date  . Gastritis     HISTORY OF GASTRITIS--OCCAS ABDOMINAL PAIN   . H/O hiatal hernia   . Anxiety   . Complication of anesthesia     "SCRATCHED CORNEA"  WAKING UP FROM  KNEE SURGERY--'82  . Headache(784.0)     MIGRAINE- INFREQUENT  . Blood in urine     "ALWAYS"  --AND STATES ALWAYS TREATED FOR UTI--BUT HAS NEVER HAD UTI.  Marland Kitchen Arthritis     SEVERE PAIN RIGHT HIP  . Hyperlipidemia     takes Lipitor daily  . History of migraine     takes Topamax daily and Imitrex prn  . Joint pain   . Joint swelling   . Chronic back pain   . GERD (gastroesophageal reflux disease)     takes Omeprazole daily and states only bc she takes Mobic  . Nocturia   . Depression     PT'S HUSBAND DIED OF CANCER AT [facility] 02/11/12  . Insomnia     takes Xanax and Ativan prn  . Hypothyroidism 05/13/12  . Anemia     Past Surgical History  Procedure Laterality Date  . Joint replacement      RIGHT TOTAL KNEE  REPLACEMENT AND RT TOTAL KNEE REVISION    AND  LEFT TOTAL KNEE REPLACEMENT  . Tubal ligation    . Tonsillectomy    . Total hip arthroplasty  06/25/2011    Procedure: TOTAL HIP ARTHROPLASTY ANTERIOR APPROACH;  Surgeon: [name], MD;  Location: [facility];  Service: Orthopedics;  Laterality: Left;  Marland Kitchen Eye surgery  1994    RA surgery-  . Total knee revision  10/28/2011    Procedure: TOTAL KNEE REVISION;  Surgeon: [name], MD;  Location: [facility];  Service: Orthopedics;  Laterality: Right;  . Hematoma evacuation  12/09/2011    Procedure: EVACUATION HEMATOMA;  Surgeon: [name], MD;  Location: [facility];  Service: Orthopedics;  Laterality: Right;  Evacuation of Hematoma Right Knee, I & D and Closed Manipulation of Right Knee  . Irrigation and debridement knee  12/09/2011    Procedure: IRRIGATION AND DEBRIDEMENT KNEE;  Surgeon: [name], MD;  Location: [facility];  Service: Orthopedics;  Laterality: Right;  . Knee closed reduction  12/09/2011    Procedure: CLOSED MANIPULATION KNEE;  Surgeon: [name], MD;  Location: [facility];  Service: Orthopedics;  Laterality: Right;  .  Total hip arthroplasty  03/02/2012    Procedure: TOTAL HIP ARTHROPLASTY ANTERIOR APPROACH;  Surgeon: [name], MD;  Location: [facility];  Service: Orthopedics;  Laterality: Right;  . Colonoscopy    . Esophagogastroduodenoscopy    . Epidural injections      x 2 in the past 4weeks  . Wound exploration Bilateral 07/06/2012    Procedure: WOUND EXPLORATION lumbar;  Surgeon: Temple Pacini, MD;  Location: [facility];  Service: Neurosurgery;  Laterality: Bilateral;  Incision and Drainage  . Lumbar spine surgery  05/15/12    3 disc fusion    Current Outpatient Prescriptions  Medication Sig Dispense Refill  . metroNIDAZOLE (METROGEL) 1 % gel     . atorvastatin (LIPITOR) 40 MG tablet Take 20 mg by mouth every evening.     . betamethasone valerate ointment (VALISONE) 0.1 % Apply topically 2 (two) times daily. 45 g 1  .  calcium-vitamin D (OSCAL WITH D) 500-200 MG-UNIT per tablet Take 2 tablets by mouth daily.     Marland Kitchen levothyroxine (SYNTHROID, LEVOTHROID) 25 MCG tablet Take 25 mcg by mouth daily.    . meloxicam (MOBIC) 15 MG tablet Take 15 mg by mouth daily.    . Multiple Vitamin (MULTIVITAMIN WITH MINERALS) TABS Take 1 tablet by mouth daily.    Marland Kitchen omeprazole (PRILOSEC) 40 MG capsule Take 40 mg by mouth every evening.    . sertraline (ZOLOFT) 25 MG tablet Take 25 mg by mouth daily. 1 tablet daily    . SUMAtriptan (IMITREX) 100 MG tablet Take 100 mg by mouth every 2 (two) hours as needed for migraine.     . topiramate (TOPAMAX) 50 MG tablet Take 100 mg by mouth every evening.     No current facility-administered medications for this visit.    Family History  Problem Relation Age of Onset  . Diabetes Mother   . Hypertension Mother   . Thyroid disease Mother   . Thyroid disease Father   . Heart failure Father   . Hypertension Father     ROS:  Pertinent items are noted in HPI.  Otherwise, a comprehensive ROS was negative.  Exam:   BP 116/86 mmHg  Pulse 82  Ht  (1.549 m)  Wt 194 lb (87.998 kg)  BMI 36.67 kg/m2  LMP 12/27/2002 Height:  (154.9 cm) Ht Readings from Last 3 Encounters:  05/09/14  (1.549 m)  01/19/13  (1.549 m)  09/17/12  (1.549 m)    General appearance: alert, cooperative and appears stated age Head: Normocephalic, without obvious abnormality, atraumatic Neck: no adenopathy, supple, symmetrical, trachea midline and thyroid normal to inspection and palpation Lungs: clear to auscultation bilaterally Breasts: normal appearance, no masses or tenderness Heart: regular rate and rhythm Abdomen: soft, non-tender; no masses,  no organomegaly Extremities: extremities normal, atraumatic, no cyanosis or edema Skin: Skin color, texture, turgor normal. No rashes or lesions Lymph nodes: Cervical, supraclavicular, and axillary nodes normal. No abnormal inguinal nodes  palpated Neurologic: Grossly normal   Pelvic: External genitalia:  no lesions              Urethra:  normal appearing urethra with no masses, tenderness or lesions              Bartholin's and Skene's: normal                 Vagina: normal appearing vagina with normal color and discharge, no lesions  Cervix: anteverted              Pap taken: No. Bimanual Exam:  Uterus:  normal size, contour, position, consistency, mobility, non-tender              Adnexa: no mass, fullness, tenderness               Rectovaginal: Confirms               Anus:  normal sphincter tone, no lesions  Chaperone present: No  A:  Well Woman with normal exam  Postmenopausal no HRT  P:   Reviewed health and wellness pertinent to exam  Pap smear not taken today  Mammogram is due 04/2015  Counseled on breast self exam, mammography screening, adequate intake of calcium and vitamin D, diet and exercise, Kegel's exercises return annually or prn  An After Visit Summary was printed and given to the patient.

## 2014-05-10 NOTE — Progress Notes (Signed)
Encounter reviewed by Dr. Brook Silva.  

## 2014-05-17 ENCOUNTER — Encounter: Payer: Self-pay | Admitting: *Deleted

## 2014-05-17 NOTE — Telephone Encounter (Signed)
Result letter sent to patient who received via MyChart.  Pt states she was just here and discussed results with Ms. Patty in detail.  Pt states she did receive phone calls, but thought we were calling her to schedule appt and did not ask to speak with me.   Pt has received results.  Routing to provider for final review.  Closing encounter.

## 2015-05-11 ENCOUNTER — Ambulatory Visit: Payer: PRIVATE HEALTH INSURANCE | Admitting: Nurse Practitioner

## 2015-05-16 ENCOUNTER — Ambulatory Visit (INDEPENDENT_AMBULATORY_CARE_PROVIDER_SITE_OTHER): Payer: Managed Care, Other (non HMO) | Admitting: Nurse Practitioner

## 2015-05-16 ENCOUNTER — Encounter: Payer: Self-pay | Admitting: *Deleted

## 2015-05-16 ENCOUNTER — Encounter: Payer: Self-pay | Admitting: Nurse Practitioner

## 2015-05-16 VITALS — BP 100/60 | HR 64 | Resp 16 | Ht 61.0 in | Wt 201.0 lb

## 2015-05-16 DIAGNOSIS — Z0000 Encounter for general adult medical examination without abnormal findings: Secondary | ICD-10-CM | POA: Diagnosis not present

## 2015-05-16 DIAGNOSIS — Z01419 Encounter for gynecological examination (general) (routine) without abnormal findings: Secondary | ICD-10-CM | POA: Diagnosis not present

## 2015-05-16 LAB — POCT URINALYSIS DIPSTICK
Bilirubin, UA: NEGATIVE
Blood, UA: NEGATIVE
GLUCOSE UA: NEGATIVE
KETONES UA: NEGATIVE
LEUKOCYTES UA: NEGATIVE
Nitrite, UA: NEGATIVE
PROTEIN UA: NEGATIVE
Urobilinogen, UA: NEGATIVE
pH, UA: 5

## 2015-05-16 MED ORDER — CLOBETASOL PROPIONATE 0.05 % EX OINT: 1.0000 "application " | TOPICAL_OINTMENT | Freq: Two times a day (BID) | CUTANEOUS | Status: DC

## 2015-05-16 NOTE — Patient Instructions (Signed)

## 2015-05-16 NOTE — Progress Notes (Signed)
65 y.o. [id number] Widowed  white Fe here for annual exam.  No new diagnosis.  She is having a flare of the peri anal area.  Patient's last menstrual period was 12/27/2002.          Sexually active: No.  The current method of family planning is post menopausal status.    Exercising: No. little walking the dog Smoker:  no  Health Maintenance: Pap:  12/30/11 Wnl HR HPV neg MMG:  05/08/15 BIRADS Category 1 Negative 3D MMG [facility] Colonoscopy:  2009 repeat in 10 years, IFOB 11/16 negative BMD:   03/21/14 Dexa Radius -0.3, -1.3 Spine Bilateral Hip replacement TDaP:  07/2009 Shingles: 2014 Pneumonia: Never HIV: completed 07/07/2012, Hep C today Labs: PCP  UA: Neg   reports that she quit smoking about 38 years ago. She has never used smokeless tobacco. She reports that she does not drink alcohol or use illicit drugs.  Past Medical History  Diagnosis Date  . Gastritis     HISTORY OF GASTRITIS--OCCAS ABDOMINAL PAIN   . H/O hiatal hernia   . Anxiety   . Complication of anesthesia     "SCRATCHED CORNEA"  WAKING UP FROM  KNEE SURGERY--'82  . Headache(784.0)     MIGRAINE- INFREQUENT  . Blood in urine     "ALWAYS"  --AND STATES ALWAYS TREATED FOR UTI--BUT HAS NEVER HAD UTI.  Marland Kitchen Arthritis     SEVERE PAIN RIGHT HIP  . Hyperlipidemia     takes Lipitor daily  . History of migraine     takes Topamax daily and Imitrex prn  . Joint pain   . Joint swelling   . Chronic back pain   . GERD (gastroesophageal reflux disease)     takes Omeprazole daily and states only bc she takes Mobic  . Nocturia   . Depression     PT'S HUSBAND DIED OF CANCER AT [facility] 02/27/12  . Insomnia     takes Xanax and Ativan prn  . Hypothyroidism 05/13/12  . Anemia     Past Surgical History  Procedure Laterality Date  . Joint replacement      RIGHT TOTAL KNEE REPLACEMENT AND RT TOTAL KNEE REVISION    AND  LEFT TOTAL KNEE REPLACEMENT  . Tubal ligation    . Tonsillectomy    . Total hip arthroplasty  06/25/2011   Procedure: TOTAL HIP ARTHROPLASTY ANTERIOR APPROACH;  Surgeon: [name], MD;  Location: [facility];  Service: Orthopedics;  Laterality: Left;  Marland Kitchen Eye surgery  1994    RA surgery-  . Total knee revision  10/28/2011    Procedure: TOTAL KNEE REVISION;  Surgeon: [name], MD;  Location: [facility];  Service: Orthopedics;  Laterality: Right;  . Hematoma evacuation  12/09/2011    Procedure: EVACUATION HEMATOMA;  Surgeon: [name], MD;  Location: [facility];  Service: Orthopedics;  Laterality: Right;  Evacuation of Hematoma Right Knee, I & D and Closed Manipulation of Right Knee  . Irrigation and debridement knee  12/09/2011    Procedure: IRRIGATION AND DEBRIDEMENT KNEE;  Surgeon: [name], MD;  Location: [facility];  Service: Orthopedics;  Laterality: Right;  . Knee closed reduction  12/09/2011    Procedure: CLOSED MANIPULATION KNEE;  Surgeon: [name], MD;  Location: [facility];  Service: Orthopedics;  Laterality: Right;  . Total hip arthroplasty  03/02/2012    Procedure: TOTAL HIP ARTHROPLASTY ANTERIOR APPROACH;  Surgeon: [name], MD;  Location: [facility];  Service: Orthopedics;  Laterality: Right;  . Colonoscopy    . Esophagogastroduodenoscopy    . Epidural injections      x 2 in the past 4weeks  . Wound exploration Bilateral 07/06/2012    Procedure: WOUND EXPLORATION lumbar;  Surgeon: Temple Pacini, MD;  Location: [facility];  Service: Neurosurgery;  Laterality: Bilateral;  Incision and Drainage  . Lumbar spine surgery  05/15/12    3 disc fusion    Current Outpatient Prescriptions  Medication Sig Dispense Refill  . atorvastatin (LIPITOR) 40 MG tablet Take 20 mg by mouth every evening.     . betamethasone valerate ointment (VALISONE) 0.1 % Apply topically 2 (two) times daily. 45 g 1  . calcium-vitamin D (OSCAL WITH D) 500-200 MG-UNIT per tablet Take 2 tablets by mouth daily.     Marland Kitchen levothyroxine (SYNTHROID, LEVOTHROID) 25 MCG tablet Take 25 mcg by mouth daily.    . meloxicam (MOBIC) 15 MG  tablet Take 15 mg by mouth daily.    . metroNIDAZOLE (METROGEL) 1 % gel     . Multiple Vitamin (MULTIVITAMIN WITH MINERALS) TABS Take 1 tablet by mouth daily.    Marland Kitchen omeprazole (PRILOSEC) 40 MG capsule Take 40 mg by mouth every evening.    . sertraline (ZOLOFT) 25 MG tablet Take 25 mg by mouth daily. 1 tablet daily    . SUMAtriptan (IMITREX) 100 MG tablet Take 100 mg by mouth every 2 (two) hours as needed for migraine.     . topiramate (TOPAMAX) 50 MG tablet Take 100 mg by mouth every evening.     No current facility-administered medications for this visit.    Family History  Problem Relation Age of Onset  . Diabetes Mother   . Hypertension Mother   . Thyroid disease Mother   . Thyroid disease Father   . Heart failure Father   . Hypertension Father     ROS:  Pertinent items are noted in HPI.  Otherwise, a comprehensive ROS was negative.  Exam:   BP 100/60 mmHg  Pulse 64  Resp 16  Ht  (1.549 m)  Wt 201 lb (91.173 kg)  BMI 38.00 kg/m2  LMP 12/27/2002 Height:  (154.9 cm) Ht Readings from Last 3 Encounters:  05/16/15  (1.549 m)  05/09/14  (1.549 m)  01/19/13  (1.549 m)    General appearance: alert, cooperative and appears stated age Head: Normocephalic, without obvious abnormality, atraumatic Neck: no adenopathy, supple, symmetrical, trachea midline and thyroid normal to inspection and palpation Lungs: clear to auscultation bilaterally Breasts: normal appearance, no masses or tenderness Heart: regular rate and rhythm Abdomen: soft, non-tender; no masses,  no organomegaly Extremities: extremities normal, atraumatic, no cyanosis or edema Skin: Skin color, texture, turgor normal. No rashes or lesions Lymph nodes: Cervical, supraclavicular, and axillary nodes normal. No abnormal inguinal nodes palpated Neurologic: Grossly normal   Pelvic: External genitalia:  Lesion at peri anal area that looks like LSA              Urethra:  normal appearing urethra  with no masses, tenderness or lesions              Bartholin's and Skene's: normal                 Vagina: normal appearing vagina with normal color and discharge, no lesions              Cervix: anteverted  Pap taken: Yes.   Bimanual Exam:  Uterus:  normal size, contour, position, consistency, mobility, non-tender              Adnexa: no mass, fullness, tenderness               Rectovaginal: Confirms               Anus:  normal sphincter tone, no lesions  Chaperone present: yes  A:  Well Woman with normal exam  Postmenopausal no HRT  History of Lichenoid Vulvitis proven biopsy 03/16/2007  S/P multiple joint replacements from arthritis  Hypothyroid, Headaches, hypercholesterolemia  P:   Reviewed health and wellness pertinent to exam  Pap smear as above  Mammogram is due 05/2016  Will start her on Temovate today and use BID for a few weeks until area is resolved then DC med's and only use prn.  Counseled on breast self exam, mammography screening, osteoporosis, adequate intake of calcium and vitamin D, diet and exercise return annually or prn  An After Visit Summary was printed and given to the patient.

## 2015-05-17 LAB — HEPATITIS C ANTIBODY: HCV AB: NEGATIVE

## 2015-05-18 LAB — IPS PAP TEST WITH HPV

## 2015-05-18 NOTE — Progress Notes (Signed)
Encounter reviewed by Dr. Mareesa Gathright Amundson C. Silva.  

## 2016-05-17 ENCOUNTER — Encounter: Payer: Self-pay | Admitting: Nurse Practitioner

## 2016-05-17 ENCOUNTER — Ambulatory Visit: Payer: Managed Care, Other (non HMO) | Admitting: Nurse Practitioner

## 2016-05-17 VITALS — BP 104/70 | HR 72 | Resp 16 | Ht 61.0 in | Wt 200.0 lb

## 2016-05-17 DIAGNOSIS — Z0000 Encounter for general adult medical examination without abnormal findings: Secondary | ICD-10-CM

## 2016-05-17 DIAGNOSIS — I10 Essential (primary) hypertension: Secondary | ICD-10-CM | POA: Diagnosis not present

## 2016-05-17 DIAGNOSIS — L280 Lichen simplex chronicus: Secondary | ICD-10-CM | POA: Diagnosis not present

## 2016-05-17 DIAGNOSIS — E7800 Pure hypercholesterolemia, unspecified: Secondary | ICD-10-CM

## 2016-05-17 DIAGNOSIS — Z01411 Encounter for gynecological examination (general) (routine) with abnormal findings: Secondary | ICD-10-CM | POA: Diagnosis not present

## 2016-05-17 MED ORDER — BETAMETHASONE VALERATE 0.1 % EX OINT: TOPICAL_OINTMENT | Freq: Two times a day (BID) | CUTANEOUS | 6 refills | Status: DC

## 2016-05-17 NOTE — Progress Notes (Signed)
Patient ID: Melissa MassonCynthia Kay Leach, female   DOB: 04-26-1950, 66 y.o.   MRN: [id number]  66 y.o. [id number] Widowed Caucasian Fe here for annual exam.  There has been an increase of pain in the right shoulder.  Considering shoulder replacement.  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    Patient's last menstrual period was 12/27/2002.          Sexually active: No.  The current method of family planning is post menopausal status.    Exercising: Yes.    walking the dog Smoker:  no  Health Maintenance: Pap: 05/16/15, Negative with neg HR HPV  12/30/11, Negative with neg HR HPV MMG: 05/08/16 BIRADS 1 negative Colonoscopy: 2009 repeat in 10 years, IFOB 11/17 negative BMD: 05/08/16, Osteopenia TDaP: 07/2009 Shingles: 01/30/13 Pneumonia: Not indicated due to age HIV: completed 07/07/2012 Hep C: 05/16/15 Labs: PCP takes care of labs   reports that she quit smoking about 39 years ago. She has never used smokeless tobacco. She reports that she does not drink alcohol or use drugs.  Past Medical History:  Diagnosis Date  . Anemia   . Anxiety   . Arthritis    SEVERE PAIN RIGHT HIP  . Blood in urine    "ALWAYS"  --AND STATES ALWAYS TREATED FOR UTI--BUT HAS NEVER HAD UTI.  Marland Kitchen. Chronic back pain   . Complication of anesthesia    "SCRATCHED CORNEA"  WAKING UP FROM  KNEE SURGERY--'82  . Depression    PT'S HUSBAND [facility] 01/31/12  . Gastritis    HISTORY OF GASTRITIS--OCCAS ABDOMINAL PAIN   . GERD (gastroesophageal reflux disease)    takes Omeprazole daily and states only bc she takes Mobic  . H/O hiatal hernia   . Headache(784.0)    MIGRAINE- INFREQUENT  . History of migraine    takes Topamax daily and Imitrex prn  . Hyperlipidemia    takes Lipitor daily  . Hypothyroidism 05/13/12  . Insomnia    takes Xanax and Ativan prn  . Joint pain   . Joint swelling   . Lichenoid dermatitis 03/16/2007   perianal biopsy  . Nocturia     Past  Surgical History:  Procedure Laterality Date  . COLONOSCOPY    . epidural injections     x 2 in the past 4weeks  . ESOPHAGOGASTRODUODENOSCOPY    . EYE SURGERY  1994   RA surgery-  . HEMATOMA EVACUATION  12/09/2011   Procedure: EVACUATION HEMATOMA;  Surgeon: [name] PalMatthew D Olin, MD;  Location: [facility];  Service: Orthopedics;  Laterality: Right;  Evacuation of Hematoma Right Knee, I & D and Closed Manipulation of Right Knee  . IRRIGATION AND DEBRIDEMENT KNEE  12/09/2011   Procedure: IRRIGATION AND DEBRIDEMENT KNEE;  Surgeon: [name] PalMatthew D Olin, MD;  Location: [facility];  Service: Orthopedics;  Laterality: Right;  . JOINT REPLACEMENT     RIGHT TOTAL KNEE REPLACEMENT AND RT TOTAL KNEE REVISION    AND  LEFT TOTAL KNEE REPLACEMENT  . KNEE CLOSED REDUCTION  12/09/2011   Procedure: CLOSED MANIPULATION KNEE;  Surgeon: [name] PalMatthew D Olin, MD;  Location: [facility];  Service: Orthopedics;  Laterality: Right;  . LUMBAR SPINE SURGERY  05/15/12   3 disc fusion  . TONSILLECTOMY    . TOTAL HIP ARTHROPLASTY  06/25/2011   Procedure: TOTAL HIP ARTHROPLASTY ANTERIOR APPROACH;  Surgeon: [name] PalMatthew D Olin, MD;  [facility]: [facility];  Service: Orthopedics;  Laterality: Left;  .  TOTAL HIP ARTHROPLASTY  03/02/2012   Procedure: TOTAL HIP ARTHROPLASTY ANTERIOR APPROACH;  Surgeon: [name], MD;  Location: [facility];  Service: Orthopedics;  Laterality: Right;  . TOTAL KNEE REVISION  10/28/2011   Procedure: TOTAL KNEE REVISION;  Surgeon: [name], MD;  Location: [facility];  Service: Orthopedics;  Laterality: Right;  . TUBAL LIGATION    . WOUND EXPLORATION Bilateral 07/06/2012   Procedure: WOUND EXPLORATION lumbar;  Surgeon: Temple Pacini, MD;  Location: [facility];  Service: Neurosurgery;  Laterality: Bilateral;  Incision and Drainage    Current Outpatient Prescriptions  Medication Sig Dispense Refill  . atorvastatin (LIPITOR) 40 MG tablet Take 20 mg by mouth every evening.     . betamethasone valerate ointment (VALISONE) 0.1 % Apply topically 2  (two) times daily. 45 g 6  . calcium-vitamin D (OSCAL WITH D) 500-200 MG-UNIT per tablet Take 2 tablets by mouth daily.     . Levothyroxine Sodium 50 MCG CAPS Take 50 mcg by mouth daily.     . meloxicam (MOBIC) 15 MG tablet Take 15 mg by mouth daily.    . metroNIDAZOLE (METROGEL) 1 % gel     . Multiple Vitamin (MULTIVITAMIN WITH MINERALS) TABS Take 1 tablet by mouth daily.    Marland Kitchen omeprazole (PRILOSEC) 40 MG capsule Take 40 mg by mouth every evening.    . sertraline (ZOLOFT) 25 MG tablet Take 25 mg by mouth daily. 1 tablet daily    . SUMAtriptan (IMITREX) 100 MG tablet Take 100 mg by mouth every 2 (two) hours as needed for migraine.     . topiramate (TOPAMAX) 50 MG tablet Take 100 mg by mouth every evening.     No current facility-administered medications for this visit.     Family History  Problem Relation Age of Onset  . Diabetes Mother   . Hypertension Mother   . Thyroid disease Mother   . Thyroid disease Father   . Heart failure Father   . Hypertension Father     ROS:  Pertinent items are noted in HPI.  Otherwise, a comprehensive ROS was negative.  Exam:   BP 104/70 (BP Location: Right Arm, Patient Position: Sitting, Cuff Size: Normal)   Pulse 72   Resp 16   Ht 5\' 1"  (1.549 m)   Wt 200 lb (90.7 kg)   LMP 12/27/2002   BMI 37.79 kg/m  Height: 5\' 1"  (154.9 cm) Ht Readings from Last 3 Encounters:  05/17/16 5\' 1"  (1.549 m)  05/16/15 5\' 1"  (1.549 m)  05/09/14 5\' 1"  (1.549 m)    General appearance: alert, cooperative and appears stated age Head: Normocephalic, without obvious abnormality, atraumatic Neck: no adenopathy, supple, symmetrical, trachea midline and thyroid normal to inspection and palpation Lungs: clear to auscultation bilaterally Breasts: normal appearance, no masses or tenderness Heart: regular rate and rhythm Abdomen: soft, non-tender; no masses,  no organomegaly Extremities: extremities normal, atraumatic, no cyanosis or edema Skin: Skin color, texture,  turgor normal. No rashes or lesions Lymph nodes: Cervical, supraclavicular, and axillary nodes normal. No abnormal inguinal nodes palpated Neurologic: Grossly normal   Pelvic: External genitalia:  no lesions or flare of vulvitis at this time.              Urethra:  normal appearing urethra with no masses, tenderness or lesions              Bartholin's and Skene's: normal  Vagina: normal appearing vagina with normal color and discharge, no lesions              Cervix: anteverted              Pap taken: No. Bimanual Exam:  Uterus:  normal size, contour, position, consistency, mobility, non-tender              Adnexa: no mass, fullness, tenderness               Rectovaginal: Confirms               Anus:  normal sphincter tone, no lesions  Chaperone present: yes  A:  Well Woman with normal exam    Postmenopausal no HRT             History of Lichenoid Vulvitis proven biopsy 03/16/2007             S/P multiple joint replacements from arthritis             Hypothyroid, Headaches, hypercholesterolemia   P:   Reviewed health and wellness pertinent to exam  Pap smear not done  Mammogram is due 05/2017  Will DC Temovate and change to Valisone for treatment of vulvitis  Counseled on breast self exam, mammography screening, adequate intake of calcium and vitamin D, diet and exercise, Kegel's exercises return annually or prn  An After Visit Summary was printed and given to the patient.

## 2016-05-17 NOTE — Patient Instructions (Signed)

## 2016-05-19 NOTE — Progress Notes (Signed)
Encounter reviewed by Dr. Myan Suit Amundson C. Silva.  

## 2016-11-05 DIAGNOSIS — M19011 Primary osteoarthritis, right shoulder: Secondary | ICD-10-CM | POA: Diagnosis not present

## 2016-11-05 DIAGNOSIS — M19012 Primary osteoarthritis, left shoulder: Secondary | ICD-10-CM | POA: Diagnosis not present

## 2016-11-12 DIAGNOSIS — M19011 Primary osteoarthritis, right shoulder: Secondary | ICD-10-CM | POA: Diagnosis not present

## 2016-11-18 DIAGNOSIS — Z23 Encounter for immunization: Secondary | ICD-10-CM | POA: Diagnosis not present

## 2016-11-18 DIAGNOSIS — E039 Hypothyroidism, unspecified: Secondary | ICD-10-CM | POA: Diagnosis not present

## 2016-11-18 DIAGNOSIS — Z01818 Encounter for other preprocedural examination: Secondary | ICD-10-CM | POA: Diagnosis not present

## 2016-11-18 DIAGNOSIS — Z79899 Other long term (current) drug therapy: Secondary | ICD-10-CM | POA: Diagnosis not present

## 2016-11-20 DIAGNOSIS — R69 Illness, unspecified: Secondary | ICD-10-CM | POA: Diagnosis not present

## 2016-12-03 DIAGNOSIS — Z0100 Encounter for examination of eyes and vision without abnormal findings: Secondary | ICD-10-CM | POA: Diagnosis not present

## 2016-12-03 DIAGNOSIS — H2513 Age-related nuclear cataract, bilateral: Secondary | ICD-10-CM | POA: Diagnosis not present

## 2016-12-26 NOTE — H&P (Signed)
Melissa Leach is an 66 y.o. female.    Chief Complaint: right shoulder pain  HPI: Pt is a 66 y.o. female complaining of right shoulder pain for multiple years. Pain had continually increased since the beginning. X-rays in the clinic show end-stage arthritic changes of the right shoulder. Pt has tried various conservative treatments which have failed to alleviate their symptoms, including injections and therapy. Various options are discussed with the patient. Risks, benefits and expectations were discussed with the patient. Patient understand the risks, benefits and expectations and wishes to proceed with surgery.   PCP:  Mila Palmer, MD  D/C Plans: Home  PMH: Past Medical History:  Diagnosis Date  . Anemia   . Anxiety   . Arthritis    SEVERE PAIN RIGHT HIP  . Blood in urine    "ALWAYS"  --AND STATES ALWAYS TREATED FOR UTI--BUT HAS NEVER HAD UTI.  Marland Kitchen Chronic back pain   . Complication of anesthesia    "SCRATCHED CORNEA"  WAKING UP FROM  KNEE SURGERY--'82  . Depression    PT'S HUSBAND DIED OF CANCER AT [facility] 2012/02/15  . Gastritis    HISTORY OF GASTRITIS--OCCAS ABDOMINAL PAIN   . GERD (gastroesophageal reflux disease)    takes Omeprazole daily and states only bc she takes Mobic  . H/O hiatal hernia   . Headache(784.0)    MIGRAINE- INFREQUENT  . History of migraine    takes Topamax daily and Imitrex prn  . Hyperlipidemia    takes Lipitor daily  . Hypothyroidism 05/13/12  . Insomnia    takes Xanax and Ativan prn  . Joint pain   . Joint swelling   . Lichenoid dermatitis 03/16/2007   perianal biopsy  . Nocturia     PSH: Past Surgical History:  Procedure Laterality Date  . COLONOSCOPY    . epidural injections     x 2 in the past 4weeks  . ESOPHAGOGASTRODUODENOSCOPY    . EYE SURGERY  1994   RA surgery-  . HEMATOMA EVACUATION  12/09/2011   Procedure: EVACUATION HEMATOMA;  Surgeon: [name], MD;  Location: [facility];  Service: Orthopedics;  Laterality: Right;   Evacuation of Hematoma Right Knee, I & D and Closed Manipulation of Right Knee  . IRRIGATION AND DEBRIDEMENT KNEE  12/09/2011   Procedure: IRRIGATION AND DEBRIDEMENT KNEE;  Surgeon: [name], MD;  Location: [facility];  Service: Orthopedics;  Laterality: Right;  . JOINT REPLACEMENT     RIGHT TOTAL KNEE REPLACEMENT AND RT TOTAL KNEE REVISION    AND  LEFT TOTAL KNEE REPLACEMENT  . KNEE CLOSED REDUCTION  12/09/2011   Procedure: CLOSED MANIPULATION KNEE;  Surgeon: [name], MD;  Location: [facility];  Service: Orthopedics;  Laterality: Right;  . LUMBAR SPINE SURGERY  05/15/12   3 disc fusion  . TONSILLECTOMY    . TOTAL HIP ARTHROPLASTY  06/25/2011   Procedure: TOTAL HIP ARTHROPLASTY ANTERIOR APPROACH;  Surgeon: [name], MD;  Location: [facility];  Service: Orthopedics;  Laterality: Left;  . TOTAL HIP ARTHROPLASTY  03/02/2012   Procedure: TOTAL HIP ARTHROPLASTY ANTERIOR APPROACH;  Surgeon: [name], MD;  Location: [facility];  Service: Orthopedics;  Laterality: Right;  . TOTAL KNEE REVISION  10/28/2011   Procedure: TOTAL KNEE REVISION;  Surgeon: [name], MD;  Location: [facility];  Service: Orthopedics;  Laterality: Right;  . TUBAL LIGATION    . WOUND EXPLORATION Bilateral 07/06/2012   Procedure: WOUND EXPLORATION lumbar;  Surgeon: Sherilyn Cooter  A Pool, MD;  Location: [facility];  Service: Neurosurgery;  Laterality: Bilateral;  Incision and Drainage    Social History:  reports that she quit smoking about 40 years ago. She has never used smokeless tobacco. She reports that she does not drink alcohol or use drugs.  Allergies:  Allergies  Allergen Reactions  . Ceftriaxone     gallstones  . Sulfa Antibiotics Rash    Medications: No current facility-administered medications for this encounter.    Current Outpatient Prescriptions  Medication Sig Dispense Refill  . amoxicillin (AMOXIL) 500 MG tablet Take 2,000 mg by mouth See admin instructions. Pt takes before dental procedures    .  atorvastatin (LIPITOR) 40 MG tablet Take 20 mg by mouth every evening.     . Azelaic Acid (FINACEA) 15 % cream Apply 1 application topically 2 (two) times daily as needed. After skin is thoroughly washed and patted dry, gently but thoroughly massage a thin film of azelaic acid cream into the affected area twice daily, in the morning and evening.    . betamethasone valerate ointment (VALISONE) 0.1 % Apply topically 2 (two) times daily. (Patient taking differently: Apply 1 application topically 2 (two) times daily as needed. ) 45 g 6  . Calcium Carb-Cholecalciferol (CALCIUM 600+D3 PO) Take 1 tablet by mouth 2 (two) times daily.    Marland Kitchen levothyroxine (SYNTHROID, LEVOTHROID) 50 MCG tablet Take 50-75 mcg by mouth daily before breakfast. Pt alternates dosage every other day    . loratadine (CLARITIN) 10 MG tablet Take 10 mg by mouth daily. Allergy    . Multiple Vitamin (MULTIVITAMIN WITH MINERALS) TABS Take 1 tablet by mouth daily.    . Multiple Vitamins-Minerals (PRESERVISION AREDS 2+MULTI VIT PO) Take 1 capsule by mouth 2 (two) times daily.    . sertraline (ZOLOFT) 50 MG tablet Take 50 mg by mouth every evening. 1 tablet daily    . SUMAtriptan (IMITREX) 100 MG tablet Take 100 mg by mouth every 2 (two) hours as needed for migraine.     . topiramate (TOPAMAX) 50 MG tablet Take 100 mg by mouth every evening.    . meloxicam (MOBIC) 15 MG tablet Take 15 mg by mouth daily.      No results found for this or any previous visit (from the past 48 hour(s)). No results found.  ROS: Pain with rom of the right upper extremity  Physical Exam:  Alert and oriented 66 y.o. female in no acute distress Cranial nerves 2-12 intact Cervical spine: full rom with no tenderness, nv intact distally Chest: active breath sounds bilaterally, no wheeze rhonchi or rales Heart: regular rate and rhythm, no murmur Abd: non tender non distended with active bowel sounds Hip is stable with rom  Right shoulder painful rom with  crepitus nv intact distally No rashes or edema Weakness with ER testing  Assessment/Plan Assessment: right shoulder end stage osteoarthritis with cuff tear  Plan: Patient will undergo a right total shoulder by Dr. Ranell Patrick at [facility]. Risks benefits and expectations were discussed with the patient. Patient understand risks, benefits and expectations and wishes to proceed.

## 2016-12-26 NOTE — Pre-Procedure Instructions (Signed)
Birdia [facility]  12/26/2016      [id number] - [place], [place] - [address] AT [facility] [address] [facility] [address] [place] [address] Phone: [phone] Fax: [phone]  [id number] - [place], [place] - [address] AT CORNER OF [facility] [address] [place] [address] Phone: [phone] Fax: [phone]    Your procedure is scheduled on Fri. Oct. 5  Report to [facility] Admitting at 8:30 A.M.  Call this number if you have problems the morning of surgery:  [phone]   Remember:  Do not eat food or drink liquids after midnight on Thurs. Oct. 4   Take these medicines the morning of surgery with A SIP OF WATER : levothyroxine (synthroid),loratadine (claritin), sumatriptan (imitrex) if needed                7 days prior to surgery STOP taking any Aspirin, Aleve, Naproxen, Ibuprofen, Motrin, Advil, Goody's, BC's, all herbal medications, fish oil, and all vitamins   Do not wear jewelry, make-up or nail polish.  Do not wear lotions, powders, or perfumes, or deoderant.  Do not shave 48 hours prior to surgery.  Men may shave face and neck.  Do not bring valuables to the hospital.  [facility] is not responsible for any belongings or valuables.  Contacts, dentures or bridgework may not be worn into surgery.  Leave your suitcase in the car.  After surgery it may be brought to your room.  For patients admitted to the hospital, discharge time will be determined by your treatment team.  Patients discharged the day of surgery will not be allowed to drive home.  Special instructions:   - Preparing For Surgery  Before surgery, you can play an important role. Because skin is not sterile, your skin needs to be as free of germs as possible. You can reduce the number of germs on your skin by washing with CHG (chlorahexidine gluconate) Soap before surgery.  CHG is an antiseptic cleaner which  kills germs and bonds with the skin to continue killing germs even after washing.  Please do not use if you have an allergy to CHG or antibacterial soaps. If your skin becomes reddened/irritated stop using the CHG.  Do not shave (including legs and underarms) for at least 48 hours prior to first CHG shower. It is OK to shave your face.  Please follow these instructions carefully.   1. Shower the NIGHT BEFORE SURGERY and the MORNING OF SURGERY with CHG.   2. If you chose to wash your hair, wash your hair first as usual with your normal shampoo.  3. After you shampoo, rinse your hair and body thoroughly to remove the shampoo.  4. Use CHG as you would any other liquid soap. You can apply CHG directly to the skin and wash gently with a scrungie or a clean washcloth.   5. Apply the CHG Soap to your body ONLY FROM THE NECK DOWN.  Do not use on open wounds or open sores. Avoid contact with your eyes, ears, mouth and genitals (private parts). Wash genitals (private parts) with your normal soap.  6. Wash thoroughly, paying special attention to the area where your surgery will be performed.  7. Thoroughly rinse your body with warm water from the neck down.  8. DO NOT shower/wash with your normal soap after using and rinsing off the CHG Soap.  9. Pat yourself  dry with a CLEAN TOWEL.   10. Wear CLEAN PAJAMAS   11. Place CLEAN SHEETS on your bed the night of your first shower and DO NOT SLEEP WITH PETS.    Day of Surgery: Do not apply any deodorants/lotions. Please wear clean clothes to the hospital/surgery center.      Please read over the following fact sheets that you were given. Coughing and Deep Breathing, MRSA Information and Surgical Site Infection Prevention

## 2016-12-27 ENCOUNTER — Encounter (HOSPITAL_COMMUNITY)
Admission: RE | Admit: 2016-12-27 | Discharge: 2016-12-27 | Disposition: A | Discharge status: Home / Self Care | Payer: Medicare HMO | Source: Ambulatory Visit | Attending: Orthopedic Surgery | Admitting: Orthopedic Surgery

## 2016-12-27 ENCOUNTER — Encounter (HOSPITAL_COMMUNITY): Payer: Self-pay

## 2016-12-27 DIAGNOSIS — E669 Obesity, unspecified: Secondary | ICD-10-CM | POA: Insufficient documentation

## 2016-12-27 DIAGNOSIS — I10 Essential (primary) hypertension: Secondary | ICD-10-CM | POA: Diagnosis not present

## 2016-12-27 DIAGNOSIS — Z01812 Encounter for preprocedural laboratory examination: Secondary | ICD-10-CM | POA: Diagnosis present

## 2016-12-27 DIAGNOSIS — L280 Lichen simplex chronicus: Secondary | ICD-10-CM | POA: Diagnosis not present

## 2016-12-27 LAB — SURGICAL PCR SCREEN
MRSA, PCR: NEGATIVE
STAPHYLOCOCCUS AUREUS: NEGATIVE

## 2016-12-27 LAB — CBC
HCT: 40.2 % (ref 36.0–46.0)
Hemoglobin: 12.9 g/dL (ref 12.0–15.0)
MCH: 30.1 pg (ref 26.0–34.0)
MCHC: 32.1 g/dL (ref 30.0–36.0)
MCV: 93.7 fL (ref 78.0–100.0)
PLATELETS: 235 10*3/uL (ref 150–400)
RBC: 4.29 MIL/uL (ref 3.87–5.11)
RDW: 14.7 % (ref 11.5–15.5)
WBC: 6.8 10*3/uL (ref 4.0–10.5)

## 2016-12-27 NOTE — Progress Notes (Signed)
False positive Stress test 2000,confirmed by cardiac cath which was normal.  Denies any problems since ,no cardiology follow

## 2016-12-30 DIAGNOSIS — E039 Hypothyroidism, unspecified: Secondary | ICD-10-CM | POA: Diagnosis not present

## 2017-01-02 MED ORDER — CLINDAMYCIN PHOSPHATE 900 MG/50ML IV SOLN
900.0000 mg | INTRAVENOUS | Status: AC
  Administered 2017-01-03: 900 mg via INTRAVENOUS
  Filled 2017-01-02: qty 50

## 2017-01-03 ENCOUNTER — Encounter (HOSPITAL_COMMUNITY)
Admission: RE | Disposition: A | Discharge status: Home / Self Care | Payer: Self-pay | Source: Ambulatory Visit | Attending: Orthopedic Surgery

## 2017-01-03 ENCOUNTER — Inpatient Hospital Stay (HOSPITAL_COMMUNITY)
Admission: RE | Admit: 2017-01-03 | Discharge: 2017-01-04 | DRG: 483 | Disposition: A | Discharge status: Home / Self Care | Payer: Medicare HMO | Source: Ambulatory Visit | Attending: Orthopedic Surgery | Admitting: Orthopedic Surgery

## 2017-01-03 ENCOUNTER — Inpatient Hospital Stay (HOSPITAL_COMMUNITY): Payer: Medicare HMO | Admitting: Anesthesiology

## 2017-01-03 ENCOUNTER — Encounter (HOSPITAL_COMMUNITY): Payer: Self-pay | Admitting: Urology

## 2017-01-03 ENCOUNTER — Inpatient Hospital Stay (HOSPITAL_COMMUNITY): Payer: Medicare HMO

## 2017-01-03 DIAGNOSIS — Z79899 Other long term (current) drug therapy: Secondary | ICD-10-CM

## 2017-01-03 DIAGNOSIS — Z882 Allergy status to sulfonamides status: Secondary | ICD-10-CM

## 2017-01-03 DIAGNOSIS — Z96611 Presence of right artificial shoulder joint: Secondary | ICD-10-CM

## 2017-01-03 DIAGNOSIS — I10 Essential (primary) hypertension: Secondary | ICD-10-CM | POA: Diagnosis not present

## 2017-01-03 DIAGNOSIS — Z791 Long term (current) use of non-steroidal anti-inflammatories (NSAID): Secondary | ICD-10-CM | POA: Diagnosis not present

## 2017-01-03 DIAGNOSIS — Z96643 Presence of artificial hip joint, bilateral: Secondary | ICD-10-CM | POA: Diagnosis present

## 2017-01-03 DIAGNOSIS — E785 Hyperlipidemia, unspecified: Secondary | ICD-10-CM | POA: Diagnosis present

## 2017-01-03 DIAGNOSIS — Z881 Allergy status to other antibiotic agents status: Secondary | ICD-10-CM | POA: Diagnosis not present

## 2017-01-03 DIAGNOSIS — G8918 Other acute postprocedural pain: Secondary | ICD-10-CM | POA: Diagnosis not present

## 2017-01-03 DIAGNOSIS — Z981 Arthrodesis status: Secondary | ICD-10-CM | POA: Diagnosis not present

## 2017-01-03 DIAGNOSIS — M75111 Incomplete rotator cuff tear or rupture of right shoulder, not specified as traumatic: Secondary | ICD-10-CM | POA: Diagnosis not present

## 2017-01-03 DIAGNOSIS — E039 Hypothyroidism, unspecified: Secondary | ICD-10-CM | POA: Diagnosis present

## 2017-01-03 DIAGNOSIS — M19011 Primary osteoarthritis, right shoulder: Principal | ICD-10-CM | POA: Diagnosis present

## 2017-01-03 DIAGNOSIS — Z87891 Personal history of nicotine dependence: Secondary | ICD-10-CM

## 2017-01-03 DIAGNOSIS — Z471 Aftercare following joint replacement surgery: Secondary | ICD-10-CM | POA: Diagnosis not present

## 2017-01-03 HISTORY — PX: TOTAL SHOULDER ARTHROPLASTY: SHX126

## 2017-01-03 SURGERY — ARTHROPLASTY, SHOULDER, TOTAL
Anesthesia: General | Site: Shoulder | Laterality: Right

## 2017-01-03 MED ORDER — FENTANYL CITRATE (PF) 100 MCG/2ML IJ SOLN
100.0000 ug | Freq: Once | INTRAMUSCULAR | Status: AC
  Administered 2017-01-03: 100 ug via INTRAVENOUS

## 2017-01-03 MED ORDER — ONDANSETRON HCL 4 MG/2ML IJ SOLN
INTRAMUSCULAR | Status: AC
  Filled 2017-01-03: qty 2

## 2017-01-03 MED ORDER — OXYCODONE-ACETAMINOPHEN 5-325 MG PO TABS: 1.0000 | ORAL_TABLET | ORAL | 0 refills | Status: DC | PRN

## 2017-01-03 MED ORDER — GELATIN ABSORBABLE MT POWD
OROMUCOSAL | Status: DC | PRN
  Administered 2017-01-03: 5 mL via TOPICAL

## 2017-01-03 MED ORDER — DEXAMETHASONE SODIUM PHOSPHATE 10 MG/ML IJ SOLN
INTRAMUSCULAR | Status: AC
  Filled 2017-01-03: qty 1

## 2017-01-03 MED ORDER — LACTATED RINGERS IV SOLN
INTRAVENOUS | Status: DC
  Administered 2017-01-03: 09:00:00 via INTRAVENOUS

## 2017-01-03 MED ORDER — MELOXICAM 7.5 MG PO TABS
15.0000 mg | ORAL_TABLET | Freq: Every day | ORAL | Status: DC
  Administered 2017-01-03 – 2017-01-04 (×2): 15 mg via ORAL
  Filled 2017-01-03 (×2): qty 2

## 2017-01-03 MED ORDER — LEVOTHYROXINE SODIUM 100 MCG PO TABS
50.0000 ug | ORAL_TABLET | ORAL | Status: DC
  Administered 2017-01-04: 50 ug via ORAL
  Filled 2017-01-03: qty 1

## 2017-01-03 MED ORDER — PROPOFOL 10 MG/ML IV BOLUS
INTRAVENOUS | Status: DC | PRN
  Administered 2017-01-03: 30 mg via INTRAVENOUS
  Administered 2017-01-03: 140 mg via INTRAVENOUS

## 2017-01-03 MED ORDER — OXYCODONE HCL 5 MG PO TABS
5.0000 mg | ORAL_TABLET | ORAL | Status: DC | PRN
  Administered 2017-01-04 (×3): 10 mg via ORAL
  Filled 2017-01-03 (×3): qty 2

## 2017-01-03 MED ORDER — DEXTROSE 5 % IV SOLN
500.0000 mg | Freq: Four times a day (QID) | INTRAVENOUS | Status: DC | PRN
  Administered 2017-01-03: 500 mg via INTRAVENOUS
  Filled 2017-01-03 (×2): qty 5

## 2017-01-03 MED ORDER — ATORVASTATIN CALCIUM 20 MG PO TABS
20.0000 mg | ORAL_TABLET | Freq: Every evening | ORAL | Status: DC
  Administered 2017-01-03: 20 mg via ORAL
  Filled 2017-01-03: qty 1

## 2017-01-03 MED ORDER — METOCLOPRAMIDE HCL 5 MG/ML IJ SOLN: 10.0000 mg | Freq: Once | INTRAMUSCULAR | Status: DC | PRN

## 2017-01-03 MED ORDER — MEPERIDINE HCL 25 MG/ML IJ SOLN: 6.2500 mg | INTRAMUSCULAR | Status: DC | PRN

## 2017-01-03 MED ORDER — FENTANYL CITRATE (PF) 100 MCG/2ML IJ SOLN
25.0000 ug | INTRAMUSCULAR | Status: DC | PRN
  Administered 2017-01-03: 50 ug via INTRAVENOUS

## 2017-01-03 MED ORDER — ONDANSETRON HCL 4 MG/2ML IJ SOLN
INTRAMUSCULAR | Status: DC | PRN
Start: 2017-01-03 — End: 2017-01-03
  Administered 2017-01-03: 4 mg via INTRAVENOUS

## 2017-01-03 MED ORDER — BISACODYL 10 MG RE SUPP: 10.0000 mg | Freq: Every day | RECTAL | Status: DC | PRN

## 2017-01-03 MED ORDER — LEVOTHYROXINE SODIUM 100 MCG PO TABS: 50.0000 ug | ORAL_TABLET | Freq: Every day | ORAL | Status: DC

## 2017-01-03 MED ORDER — METHOCARBAMOL 500 MG PO TABS: 500.0000 mg | ORAL_TABLET | Freq: Three times a day (TID) | ORAL | 1 refills | Status: DC | PRN

## 2017-01-03 MED ORDER — CHLORHEXIDINE GLUCONATE 4 % EX LIQD: 60.0000 mL | Freq: Once | CUTANEOUS | Status: DC

## 2017-01-03 MED ORDER — LACTATED RINGERS IV SOLN: INTRAVENOUS | Status: DC

## 2017-01-03 MED ORDER — DOCUSATE SODIUM 100 MG PO CAPS
100.0000 mg | ORAL_CAPSULE | Freq: Two times a day (BID) | ORAL | Status: DC
  Administered 2017-01-03 – 2017-01-04 (×3): 100 mg via ORAL
  Filled 2017-01-03 (×3): qty 1

## 2017-01-03 MED ORDER — ONDANSETRON HCL 4 MG/2ML IJ SOLN: 4.0000 mg | Freq: Four times a day (QID) | INTRAMUSCULAR | Status: DC | PRN

## 2017-01-03 MED ORDER — ROPIVACAINE HCL 7.5 MG/ML IJ SOLN
INTRAMUSCULAR | Status: DC | PRN
  Administered 2017-01-03: 20 mL via PERINEURAL

## 2017-01-03 MED ORDER — MORPHINE SULFATE (PF) 4 MG/ML IV SOLN
2.0000 mg | INTRAVENOUS | Status: DC | PRN
  Administered 2017-01-03: 4 mg via INTRAVENOUS
  Filled 2017-01-03: qty 1

## 2017-01-03 MED ORDER — ROCURONIUM BROMIDE 100 MG/10ML IV SOLN
INTRAVENOUS | Status: DC | PRN
  Administered 2017-01-03: 30 mg via INTRAVENOUS

## 2017-01-03 MED ORDER — POLYETHYLENE GLYCOL 3350 17 G PO PACK: 17.0000 g | PACK | Freq: Every day | ORAL | Status: DC | PRN

## 2017-01-03 MED ORDER — LIDOCAINE 2% (20 MG/ML) 5 ML SYRINGE
INTRAMUSCULAR | Status: AC
  Filled 2017-01-03: qty 5

## 2017-01-03 MED ORDER — SUMATRIPTAN SUCCINATE 50 MG PO TABS: 100.0000 mg | ORAL_TABLET | ORAL | Status: DC | PRN

## 2017-01-03 MED ORDER — CLINDAMYCIN PHOSPHATE 600 MG/50ML IV SOLN
600.0000 mg | Freq: Four times a day (QID) | INTRAVENOUS | Status: AC
  Administered 2017-01-03 – 2017-01-04 (×3): 600 mg via INTRAVENOUS
  Filled 2017-01-03 (×3): qty 50

## 2017-01-03 MED ORDER — ONDANSETRON HCL 4 MG PO TABS: 4.0000 mg | ORAL_TABLET | Freq: Four times a day (QID) | ORAL | Status: DC | PRN

## 2017-01-03 MED ORDER — MIDAZOLAM HCL 2 MG/2ML IJ SOLN
2.0000 mg | Freq: Once | INTRAMUSCULAR | Status: AC
  Administered 2017-01-03: 2 mg via INTRAVENOUS

## 2017-01-03 MED ORDER — THROMBIN 5000 UNITS EX SOLR
CUTANEOUS | Status: AC
  Filled 2017-01-03: qty 5000

## 2017-01-03 MED ORDER — 0.9 % SODIUM CHLORIDE (POUR BTL) OPTIME
TOPICAL | Status: DC | PRN
  Administered 2017-01-03 (×2): 1000 mL

## 2017-01-03 MED ORDER — MIDAZOLAM HCL 2 MG/2ML IJ SOLN
INTRAMUSCULAR | Status: AC
  Administered 2017-01-03: 2 mg via INTRAVENOUS
  Filled 2017-01-03: qty 2

## 2017-01-03 MED ORDER — BUPIVACAINE-EPINEPHRINE 0.25% -1:200000 IJ SOLN
INTRAMUSCULAR | Status: DC | PRN
  Administered 2017-01-03: 9 mL

## 2017-01-03 MED ORDER — DEXAMETHASONE SODIUM PHOSPHATE 10 MG/ML IJ SOLN
INTRAMUSCULAR | Status: DC | PRN
  Administered 2017-01-03: 10 mg via INTRAVENOUS

## 2017-01-03 MED ORDER — BUPIVACAINE-EPINEPHRINE (PF) 0.25% -1:200000 IJ SOLN
INTRAMUSCULAR | Status: AC
  Filled 2017-01-03: qty 30

## 2017-01-03 MED ORDER — TOPIRAMATE 100 MG PO TABS
100.0000 mg | ORAL_TABLET | Freq: Every evening | ORAL | Status: DC
  Administered 2017-01-03: 100 mg via ORAL
  Filled 2017-01-03: qty 1

## 2017-01-03 MED ORDER — FENTANYL CITRATE (PF) 100 MCG/2ML IJ SOLN
INTRAMUSCULAR | Status: AC
  Administered 2017-01-03: 100 ug via INTRAVENOUS
  Filled 2017-01-03: qty 2

## 2017-01-03 MED ORDER — MENTHOL 3 MG MT LOZG: 1.0000 | LOZENGE | OROMUCOSAL | Status: DC | PRN

## 2017-01-03 MED ORDER — LORATADINE 10 MG PO TABS
10.0000 mg | ORAL_TABLET | Freq: Every day | ORAL | Status: DC
  Administered 2017-01-04: 10 mg via ORAL
  Filled 2017-01-03: qty 1

## 2017-01-03 MED ORDER — METOCLOPRAMIDE HCL 5 MG/ML IJ SOLN: 5.0000 mg | Freq: Three times a day (TID) | INTRAMUSCULAR | Status: DC | PRN

## 2017-01-03 MED ORDER — PROPOFOL 10 MG/ML IV BOLUS
INTRAVENOUS | Status: AC
  Filled 2017-01-03: qty 20

## 2017-01-03 MED ORDER — SODIUM CHLORIDE 0.9 % IV SOLN: INTRAVENOUS | Status: DC

## 2017-01-03 MED ORDER — ACETAMINOPHEN 325 MG PO TABS: 650.0000 mg | ORAL_TABLET | Freq: Four times a day (QID) | ORAL | Status: DC | PRN

## 2017-01-03 MED ORDER — SERTRALINE HCL 50 MG PO TABS
50.0000 mg | ORAL_TABLET | Freq: Every evening | ORAL | Status: DC
  Administered 2017-01-03: 50 mg via ORAL
  Filled 2017-01-03: qty 1

## 2017-01-03 MED ORDER — FENTANYL CITRATE (PF) 100 MCG/2ML IJ SOLN
INTRAMUSCULAR | Status: DC | PRN
  Administered 2017-01-03: 50 ug via INTRAVENOUS

## 2017-01-03 MED ORDER — LEVOTHYROXINE SODIUM 75 MCG PO TABS: 75.0000 ug | ORAL_TABLET | ORAL | Status: DC

## 2017-01-03 MED ORDER — SUGAMMADEX SODIUM 200 MG/2ML IV SOLN
INTRAVENOUS | Status: DC | PRN
  Administered 2017-01-03: 200 mg via INTRAVENOUS

## 2017-01-03 MED ORDER — DEXTROSE 5 % IV SOLN
INTRAVENOUS | Status: DC | PRN
  Administered 2017-01-03: 50 ug/min via INTRAVENOUS

## 2017-01-03 MED ORDER — LIDOCAINE HCL (CARDIAC) 20 MG/ML IV SOLN
INTRAVENOUS | Status: DC | PRN
  Administered 2017-01-03: 40 mg via INTRAVENOUS

## 2017-01-03 MED ORDER — SUGAMMADEX SODIUM 200 MG/2ML IV SOLN
INTRAVENOUS | Status: AC
  Filled 2017-01-03: qty 2

## 2017-01-03 MED ORDER — ACETAMINOPHEN 650 MG RE SUPP: 650.0000 mg | Freq: Four times a day (QID) | RECTAL | Status: DC | PRN

## 2017-01-03 MED ORDER — PHENOL 1.4 % MT LIQD: 1.0000 | OROMUCOSAL | Status: DC | PRN

## 2017-01-03 MED ORDER — FENTANYL CITRATE (PF) 250 MCG/5ML IJ SOLN
INTRAMUSCULAR | Status: AC
  Filled 2017-01-03: qty 5

## 2017-01-03 MED ORDER — ADULT MULTIVITAMIN W/MINERALS CH
1.0000 | ORAL_TABLET | Freq: Every day | ORAL | Status: DC
  Administered 2017-01-04: 1 via ORAL
  Filled 2017-01-03: qty 1

## 2017-01-03 MED ORDER — METOCLOPRAMIDE HCL 5 MG PO TABS: 5.0000 mg | ORAL_TABLET | Freq: Three times a day (TID) | ORAL | Status: DC | PRN

## 2017-01-03 MED ORDER — FENTANYL CITRATE (PF) 100 MCG/2ML IJ SOLN
INTRAMUSCULAR | Status: AC
  Filled 2017-01-03: qty 2

## 2017-01-03 MED ORDER — METHOCARBAMOL 500 MG PO TABS
500.0000 mg | ORAL_TABLET | Freq: Four times a day (QID) | ORAL | Status: DC | PRN
  Administered 2017-01-04 (×2): 500 mg via ORAL
  Filled 2017-01-03 (×2): qty 1

## 2017-01-03 SURGICAL SUPPLY — 73 items
BIT DRILL 5/64X5 DISP (BIT) ×2 IMPLANT
BLADE SAW SAG 73X25 THK (BLADE) ×1
BLADE SAW SGTL 73X25 THK (BLADE) ×1 IMPLANT
BLADE SURG 15 STRL LF DISP TIS (BLADE) IMPLANT
BLADE SURG 15 STRL SS (BLADE) ×2
BUR SURG 4X8 MED (BURR) IMPLANT
BURR SURG 4X8 MED (BURR)
CAPT SHLDR TOTAL 2 ×1 IMPLANT
CEMENT HV SMART SET (Cement) ×1 IMPLANT
COVER SURGICAL LIGHT HANDLE (MISCELLANEOUS) ×2 IMPLANT
DRAPE IMP U-DRAPE 54X76 (DRAPES) ×2 IMPLANT
DRAPE INCISE IOBAN 66X45 STRL (DRAPES) ×3 IMPLANT
DRAPE ORTHO SPLIT 77X108 STRL (DRAPES) ×4
DRAPE SURG ORHT 6 SPLT 77X108 (DRAPES) ×2 IMPLANT
DRAPE U-SHAPE 47X51 STRL (DRAPES) ×2 IMPLANT
DRSG ADAPTIC 3X8 NADH LF (GAUZE/BANDAGES/DRESSINGS) ×2 IMPLANT
DRSG PAD ABDOMINAL 8X10 ST (GAUZE/BANDAGES/DRESSINGS) ×3 IMPLANT
DURAPREP 26ML APPLICATOR (WOUND CARE) ×2 IMPLANT
ELECT BLADE 4.0 EZ CLEAN MEGAD (MISCELLANEOUS) ×2
ELECT NDL TIP 2.8 STRL (NEEDLE) ×1 IMPLANT
ELECT NEEDLE TIP 2.8 STRL (NEEDLE) ×2 IMPLANT
ELECT REM PT RETURN 9FT ADLT (ELECTROSURGICAL) ×2
ELECTRODE BLDE 4.0 EZ CLN MEGD (MISCELLANEOUS) ×1 IMPLANT
ELECTRODE REM PT RTRN 9FT ADLT (ELECTROSURGICAL) ×1 IMPLANT
GAUZE SPONGE 4X4 12PLY STRL (GAUZE/BANDAGES/DRESSINGS) ×2 IMPLANT
GLOVE BIOGEL PI IND STRL 7.0 (GLOVE) IMPLANT
GLOVE BIOGEL PI INDICATOR 7.0 (GLOVE) ×1
GLOVE BIOGEL PI ORTHO PRO 7.5 (GLOVE) ×1
GLOVE BIOGEL PI ORTHO PRO SZ8 (GLOVE) ×1
GLOVE ORTHO TXT STRL SZ7.5 (GLOVE) ×2 IMPLANT
GLOVE PI ORTHO PRO STRL 7.5 (GLOVE) ×1 IMPLANT
GLOVE PI ORTHO PRO STRL SZ8 (GLOVE) ×1 IMPLANT
GLOVE SKINSENSE NS SZ7.0 (GLOVE) ×1
GLOVE SKINSENSE STRL SZ7.0 (GLOVE) IMPLANT
GLOVE SURG ORTHO 8.5 STRL (GLOVE) ×4 IMPLANT
GOWN STRL REUS W/ TWL XL LVL3 (GOWN DISPOSABLE) ×3 IMPLANT
GOWN STRL REUS W/TWL XL LVL3 (GOWN DISPOSABLE) ×10
KIT BASIN OR (CUSTOM PROCEDURE TRAY) ×2 IMPLANT
KIT ROOM TURNOVER OR (KITS) ×2 IMPLANT
MANIFOLD NEPTUNE II (INSTRUMENTS) ×2 IMPLANT
NDL 1/2 CIR MAYO (NEEDLE) ×1 IMPLANT
NDL HYPO 25GX1X1/2 BEV (NEEDLE) ×1 IMPLANT
NDL SUT 6 .5 CRC .975X.05 MAYO (NEEDLE) ×1 IMPLANT
NEEDLE 1/2 CIR MAYO (NEEDLE) ×2 IMPLANT
NEEDLE HYPO 25GX1X1/2 BEV (NEEDLE) ×2 IMPLANT
NEEDLE MAYO TAPER (NEEDLE)
NS IRRIG 1000ML POUR BTL (IV SOLUTION) ×2 IMPLANT
PACK SHOULDER (CUSTOM PROCEDURE TRAY) ×2 IMPLANT
PAD ARMBOARD 7.5X6 YLW CONV (MISCELLANEOUS) ×4 IMPLANT
PIN METAGLENE 2.5 (PIN) IMPLANT
SLING ARM FOAM STRAP LRG (SOFTGOODS) ×1 IMPLANT
SMARTMIX MINI TOWER (MISCELLANEOUS) ×2
SPONGE LAP 18X18 X RAY DECT (DISPOSABLE) ×2 IMPLANT
SPONGE LAP 4X18 X RAY DECT (DISPOSABLE) ×1 IMPLANT
SPONGE SURGIFOAM ABS GEL SZ50 (HEMOSTASIS) ×1 IMPLANT
STRIP CLOSURE SKIN 1/2X4 (GAUZE/BANDAGES/DRESSINGS) ×2 IMPLANT
SUCTION FRAZIER HANDLE 10FR (MISCELLANEOUS) ×1
SUCTION TUBE FRAZIER 10FR DISP (MISCELLANEOUS) ×1 IMPLANT
SUT FIBERWIRE #2 38 T-5 BLUE (SUTURE) ×10
SUT MNCRL AB 4-0 PS2 18 (SUTURE) ×2 IMPLANT
SUT VIC AB 0 CT1 27 (SUTURE) ×2
SUT VIC AB 0 CT1 27XBRD ANBCTR (SUTURE) ×1 IMPLANT
SUT VIC AB 0 CT2 27 (SUTURE) ×2 IMPLANT
SUT VIC AB 2-0 CT1 27 (SUTURE) ×2
SUT VIC AB 2-0 CT1 TAPERPNT 27 (SUTURE) ×1 IMPLANT
SUT VICRYL AB 2 0 TIES (SUTURE) ×2 IMPLANT
SUTURE FIBERWR #2 38 T-5 BLUE (SUTURE) ×2 IMPLANT
SYR CONTROL 10ML LL (SYRINGE) ×2 IMPLANT
TOWEL OR 17X24 6PK STRL BLUE (TOWEL DISPOSABLE) ×1 IMPLANT
TOWEL OR 17X26 10 PK STRL BLUE (TOWEL DISPOSABLE) ×1 IMPLANT
TOWER SMARTMIX MINI (MISCELLANEOUS) ×1 IMPLANT
TRAY FOLEY W/METER SILVER 16FR (SET/KITS/TRAYS/PACK) ×1 IMPLANT
YANKAUER SUCT BULB TIP NO VENT (SUCTIONS) ×1 IMPLANT

## 2017-01-03 NOTE — Discharge Instructions (Signed)
Ice to the shoulder as much as possible.  Keep the incision clean and dry and covered for one week, then ok to get it wet in the shower.  Ok to remove the sling while seated in the house and rest on a pillow.  Keep a pillow or blanket behind the elbow so the arm is across your waist.  Do exercises as instructed three times per day.   Follow up with Dr Ranell Patrick in the office in two weeks, call [phone] for appt.

## 2017-01-03 NOTE — Transfer of Care (Signed)
Immediate Anesthesia Transfer of Care Note  Patient: Melissa Leach  Procedure(s) Performed: RIGHT SHOULDER ANATOMIC SHOULDER REPLACEMENT AND ROTATOR CUFF REPAIR (Right Shoulder)  Patient Location: PACU  Anesthesia Type:GA combined with regional for post-op pain  Level of Consciousness: awake, alert  and oriented  Airway & Oxygen Therapy: Patient Spontanous Breathing and Patient connected to nasal cannula oxygen  Post-op Assessment: Report given to RN, Post -op Vital signs reviewed and stable and Patient moving all extremities  Post vital signs: Reviewed and stable  Last Vitals:  Vitals:   01/03/17 0945 01/03/17 0950  BP: 98/60 (!) 94/54  Pulse: 81 79  Resp: 15 17  Temp:    SpO2: 100% 100%    Last Pain:  Vitals:   01/03/17 0837  TempSrc: Oral         Complications: No apparent anesthesia complications

## 2017-01-03 NOTE — Anesthesia Preprocedure Evaluation (Addendum)
Anesthesia Evaluation  Patient identified by MRN, date of birth, ID band Patient awake    Reviewed: Allergy & Precautions, NPO status , Patient's Chart, lab work & pertinent test results  Airway Mallampati: II  TM Distance: >3 FB Neck ROM: Full    Dental no notable dental hx. (+) Teeth Intact   Pulmonary neg pulmonary ROS, former smoker,    Pulmonary exam normal breath sounds clear to auscultation       Cardiovascular hypertension, Normal cardiovascular exam Rhythm:Regular Rate:Normal     Neuro/Psych negative neurological ROS  negative psych ROS   GI/Hepatic negative GI ROS, Neg liver ROS, hiatal hernia, GERD  Medicated,  Endo/Other  negative endocrine ROS  Renal/GU negative Renal ROS  negative genitourinary   Musculoskeletal negative musculoskeletal ROS (+)   Abdominal   Peds negative pediatric ROS (+)  Hematology negative hematology ROS (+)   Anesthesia Other Findings   Reproductive/Obstetrics negative OB ROS                           Anesthesia Physical Anesthesia Plan  ASA: II  Anesthesia Plan: General   Post-op Pain Management:  Regional for Post-op pain   Induction: Intravenous  PONV Risk Score and Plan: 3 and Ondansetron, Dexamethasone, Midazolam and Treatment may vary due to age or medical condition  Airway Management Planned: Oral ETT  Additional Equipment:   Intra-op Plan:   Post-operative Plan: Extubation in OR  Informed Consent: I have reviewed the patients History and Physical, chart, labs and discussed the procedure including the risks, benefits and alternatives for the proposed anesthesia with the patient or authorized representative who has indicated his/her understanding and acceptance.   Dental advisory given  Plan Discussed with: CRNA  Anesthesia Plan Comments:        Anesthesia Quick Evaluation

## 2017-01-03 NOTE — Anesthesia Procedure Notes (Addendum)
Anesthesia Regional Block: Supraclavicular block   Pre-Anesthetic Checklist: ,, timeout performed, Correct Patient, Correct Site, Correct Laterality, Correct Procedure, Correct Position, site marked, Risks and benefits discussed,  Surgical consent,  Pre-op evaluation,  At surgeon's request and post-op pain management  Laterality: Right and Upper  Prep: Maximum Sterile Barrier Precautions used, chloraprep       Needles:  Injection technique: Single-shot  Needle Type: Echogenic Stimulator Needle     Needle Length: 10cm      Additional Needles:   Procedures:,,,, ultrasound used (permanent image in chart),,,,  Narrative:  Start time: 01/03/2017 9:14 AM End time: 01/03/2017 9:24 AM Injection made incrementally with aspirations every 5 mL.  Performed by: Personally  Anesthesiologist: Phillips Grout  Additional Notes: Risks, benefits and alternative to block explained extensively.  Patient tolerated procedure well, without complications.

## 2017-01-03 NOTE — Interval H&P Note (Signed)
History and Physical Interval Note:  01/03/2017 10:09 AM  Melissa Leach  has presented today for surgery, with the diagnosis of Right shoulder osteoarthritis, partial thickness rotator cuff tear  The various methods of treatment have been discussed with the patient and family. After consideration of risks, benefits and other options for treatment, the patient has consented to  Procedure(s): RIGHT SHOULDER ANATOMIC SHOULDER REPLACEMENT AND ROTATOR CUFF REPAIR (Right) as a surgical intervention .  The patient's history has been reviewed, patient examined, no change in status, stable for surgery.  I have reviewed the patient's chart and labs.  Questions were answered to the patient's satisfaction.     Garek Schuneman,STEVEN R

## 2017-01-03 NOTE — Anesthesia Procedure Notes (Signed)
Procedure Name: Intubation Date/Time: 01/03/2017 10:29 AM Performed by: Rush Farmer E Pre-anesthesia Checklist: Patient identified, Emergency Drugs available, Suction available and Patient being monitored Patient Re-evaluated:Patient Re-evaluated prior to induction Oxygen Delivery Method: Circle system utilized Preoxygenation: Pre-oxygenation with 100% oxygen Induction Type: IV induction Ventilation: Mask ventilation without difficulty Laryngoscope Size: Mac and 3 Grade View: Grade II Tube type: Oral Tube size: 7.0 mm Number of attempts: 1 Airway Equipment and Method: Stylet Placement Confirmation: ETT inserted through vocal cords under direct vision,  positive ETCO2 and breath sounds checked- equal and bilateral Secured at: 21 cm Tube secured with: Tape Dental Injury: Teeth and Oropharynx as per pre-operative assessment

## 2017-01-03 NOTE — Anesthesia Postprocedure Evaluation (Signed)
Anesthesia Post Note  Patient: Bristol Osentoski  Procedure(s) Performed: RIGHT SHOULDER ANATOMIC SHOULDER REPLACEMENT AND ROTATOR CUFF REPAIR (Right Shoulder)     Patient location during evaluation: PACU Anesthesia Type: General Level of consciousness: awake and alert Pain management: pain level controlled Vital Signs Assessment: post-procedure vital signs reviewed and stable Respiratory status: spontaneous breathing, nonlabored ventilation, respiratory function stable and patient connected to nasal cannula oxygen Cardiovascular status: blood pressure returned to baseline and stable Postop Assessment: no apparent nausea or vomiting Anesthetic complications: no    Last Vitals:  Vitals:   01/03/17 1315 01/03/17 1330  BP: 110/74 116/67  Pulse: 95 91  Resp: (!) 23 19  Temp:    SpO2: 100% 98%    Last Pain:  Vitals:   01/03/17 1330  TempSrc:   PainSc: Asleep                 Phillips Grout

## 2017-01-03 NOTE — Brief Op Note (Signed)
01/03/2017  12:59 PM  PATIENT:  Briane Birden  66 y.o. female  PRE-OPERATIVE DIAGNOSIS:  Right shoulder osteoarthritis, end stage  POST-OPERATIVE DIAGNOSIS:  Right shoulder osteoarthritis, end stage  PROCEDURE:  Right shoulder reverse total shoulder replacement, [employer]  SURGEON:  Surgeon(s) and Role:    Beverely Low, MD - Primary  PHYSICIAN ASSISTANT:   ASSISTANTS: Thea Gist, PA-C   ANESTHESIA:   regional and general  EBL:  Total I/O In: 900 [I.V.:900] Out: 200 [Blood:200]  BLOOD ADMINISTERED:none  DRAINS: none   LOCAL MEDICATIONS USED:  MARCAINE     SPECIMEN:  No Specimen  DISPOSITION OF SPECIMEN:  N/A  COUNTS:  YES  TOURNIQUET:  * No tourniquets in log *  DICTATION: .Other Dictation: Dictation Number [id number]  PLAN OF CARE: Admit to inpatient   PATIENT DISPOSITION:  PACU - hemodynamically stable.   Delay start of Pharmacological VTE agent (>24hrs) due to surgical blood loss or risk of bleeding: yes

## 2017-01-04 LAB — HEMOGLOBIN AND HEMATOCRIT, BLOOD
HEMATOCRIT: 34 % — AB (ref 36.0–46.0)
HEMOGLOBIN: 10.6 g/dL — AB (ref 12.0–15.0)

## 2017-01-04 LAB — BASIC METABOLIC PANEL
ANION GAP: 13 (ref 5–15)
BUN: 16 mg/dL (ref 6–20)
CALCIUM: 8.3 mg/dL — AB (ref 8.9–10.3)
CO2: 20 mmol/L — ABNORMAL LOW (ref 22–32)
CREATININE: 0.84 mg/dL (ref 0.44–1.00)
Chloride: 96 mmol/L — ABNORMAL LOW (ref 101–111)
GFR calc Af Amer: >60 mL/min (ref >60)
GLUCOSE: 403 mg/dL — AB (ref 65–99)
Potassium: 3.6 mmol/L (ref 3.5–5.1)
Sodium: 129 mmol/L — ABNORMAL LOW (ref 135–145)

## 2017-01-04 NOTE — Discharge Summary (Signed)
Physician Discharge Summary    Patient ID: Melissa Herandez MRN: [id number] DOB/AGE: 1951-01-17 66 y.o.  Admit date: 01/03/2017 Discharge date:  01/04/2017  Procedures:  Procedure(s) (LRB): RIGHT SHOULDER ANATOMIC SHOULDER REPLACEMENT AND ROTATOR CUFF REPAIR (Right)  Attending Physician:  Dr. Malon Kindle  Admission Diagnoses:   Right shoulder OA, primary  Consultants:  OT  Discharge Diagnoses:  Active Problems:   S/P shoulder replacement, right  Past Medical History:  Diagnosis Date  . Anemia   . Anxiety   . Arthritis    SEVERE PAIN RIGHT HIP  . Blood in urine    "ALWAYS"  --AND STATES ALWAYS TREATED FOR UTI--BUT HAS NEVER HAD UTI.  Marland Kitchen Chronic back pain   . Complication of anesthesia    "SCRATCHED CORNEA"  WAKING UP FROM  KNEE SURGERY--'82  . Depression    PT'S HUSBAND DIED OF CANCER AT [facility] 03-01-2012  . Gastritis    HISTORY OF GASTRITIS--OCCAS ABDOMINAL PAIN   . GERD (gastroesophageal reflux disease)    takes Omeprazole daily and states only bc she takes Mobic  . H/O hiatal hernia   . Headache(784.0)    MIGRAINE- INFREQUENT  . History of migraine    takes Topamax daily and Imitrex prn  . Hyperlipidemia    takes Lipitor daily  . Hypothyroidism 05/13/12  . Insomnia    takes Xanax and Ativan prn  . Joint pain   . Joint swelling   . Lichenoid dermatitis 03/16/2007   perianal biopsy  . Nocturia      PCP: Mila Palmer, MD   Discharged Condition: good  Hospital Course:  Patient underwent the above stated procedure on 01/03/2017. Patient tolerated the procedure well and brought to the recovery room in good condition and subsequently to the floor. No complications during their hospital stay. No complicating wound issues during the hospital stay. Incision healing well and good early range of motion  Disposition: 01-Home or Self Care with follow up in 2 weeks  Medications: Current Facility-Administered Medications  Medication Dose Route Frequency Provider Last  Rate Last Dose  . 0.9 %  sodium chloride infusion   Intravenous Continuous Beverely Low, MD      . acetaminophen (TYLENOL) tablet 650 mg  650 mg Oral Q6H PRN Beverely Low, MD       Or  . acetaminophen (TYLENOL) suppository 650 mg  650 mg Rectal Q6H PRN Beverely Low, MD      . atorvastatin (LIPITOR) tablet 20 mg  20 mg Oral QPM Beverely Low, MD   20 mg at 01/03/17 1603  . bisacodyl (DULCOLAX) suppository 10 mg  10 mg Rectal Daily PRN Beverely Low, MD      . docusate sodium (COLACE) capsule 100 mg  100 mg Oral BID Beverely Low, MD   100 mg at 01/03/17 2200  . levothyroxine (SYNTHROID, LEVOTHROID) tablet 50 mcg  50 mcg Oral Q48H Beverely Low, MD   50 mcg at 01/04/17 0631  . [START ON 01/05/2017] levothyroxine (SYNTHROID, LEVOTHROID) tablet 75 mcg  75 mcg Oral Q48H Beverely Low, MD      . loratadine (CLARITIN) tablet 10 mg  10 mg Oral Daily Beverely Low, MD      . meloxicam [facility]) tablet 15 mg  15 mg Oral Daily Beverely Low, MD   15 mg at 01/03/17 1603  . menthol-cetylpyridinium (CEPACOL) lozenge 3 mg  1 lozenge Oral PRN Beverely Low, MD       Or  . phenol [facility]) mouth spray 1 spray  1 spray Mouth/Throat PRN Beverely Low, MD      . methocarbamol (ROBAXIN) tablet 500 mg  500 mg Oral Q6H PRN Beverely Low, MD   500 mg at 01/04/17 0148   Or  . methocarbamol (ROBAXIN) 500 mg in dextrose 5 % 50 mL IVPB  500 mg Intravenous Q6H PRN Beverely Low, MD 110 mL/hr at 01/03/17 1958 500 mg at 01/03/17 1958  . metoCLOPramide (REGLAN) tablet 5-10 mg  5-10 mg Oral Q8H PRN Beverely Low, MD       Or  . metoCLOPramide [facility]) injection 5-10 mg  5-10 mg Intravenous Q8H PRN Beverely Low, MD      . morphine 4 MG/ML injection 2-4 mg  2-4 mg Intravenous Q2H PRN Beverely Low, MD   4 mg at 01/03/17 2201  . multivitamin with minerals tablet 1 tablet  1 tablet Oral Daily Beverely Low, MD      . ondansetron [facility]) tablet 4 mg  4 mg Oral Q6H PRN Beverely Low, MD       Or  . ondansetron [facility])  injection 4 mg  4 mg Intravenous Q6H PRN Beverely Low, MD      . oxyCODONE (Oxy IR/ROXICODONE) immediate release tablet 5-10 mg  5-10 mg Oral Q3H PRN Beverely Low, MD   10 mg at 01/04/17 0442  . polyethylene glycol (MIRALAX / GLYCOLAX) packet 17 g  17 g Oral Daily PRN Beverely Low, MD      . sertraline (ZOLOFT) tablet 50 mg  50 mg Oral QPM Beverely Low, MD   50 mg at 01/03/17 1603  . SUMAtriptan (IMITREX) tablet 100 mg  100 mg Oral Q2H PRN Beverely Low, MD      . topiramate (TOPAMAX) tablet 100 mg  100 mg Oral QPM Beverely Low, MD   100 mg at 01/03/17 1603    Follow-up Information    Beverely Low, MD. Call in 2 weeks.   Specialty:  Orthopedic Surgery Why:  [phone] Contact information: [address] [place] [address] [phone]           Discharge Instructions    Call MD / Call 911    Complete by:  As directed    If you experience chest pain or shortness of breath, CALL 911 and be transported to the hospital emergency room.  If you develope a fever above 101 F, pus (white drainage) or increased drainage or redness at the wound, or calf pain, call your surgeon's office.   Constipation Prevention    Complete by:  As directed    Drink plenty of fluids.  Prune juice may be helpful.  You may use a stool softener, such as Colace (over the counter) 100 mg twice a day.  Use MiraLax (over the counter) for constipation as needed.   Diet - low sodium heart healthy    Complete by:  As directed    Driving restrictions    Complete by:  As directed    No driving for 2 weeks   Increase activity slowly as tolerated    Complete by:  As directed       Allergies as of 01/04/2017      Reactions   Ceftriaxone Other (See Comments)   gallstones   Sulfa Antibiotics Rash      Medication List    TAKE these medications   amoxicillin 500 MG tablet Commonly known as:  AMOXIL Take 2,000 mg by mouth See admin instructions. Pt takes before dental procedures  atorvastatin 40 MG tablet Commonly known as:  LIPITOR Take 20 mg by mouth every evening.   betamethasone valerate ointment 0.1 % Commonly known as:  VALISONE Apply topically 2 (two) times daily. What changed:  how much to take  when to take this  reasons to take this   CALCIUM 600+D3 PO Take 1 tablet by mouth 2 (two) times daily.   FINACEA 15 % cream Generic drug:  Azelaic Acid Apply 1 application topically 2 (two) times daily as needed. After skin is thoroughly washed and patted dry, gently but thoroughly massage a thin film of azelaic acid cream into the affected area twice daily, in the morning and evening.   levothyroxine 50 MCG tablet Commonly known as:  SYNTHROID, LEVOTHROID Take 50-75 mcg by mouth daily before breakfast. Pt alternates dosage every other day   loratadine 10 MG tablet Commonly known as:  CLARITIN Take 10 mg by mouth daily. Allergy   meloxicam 15 MG tablet Commonly known as:  MOBIC Take 15 mg by mouth daily.   methocarbamol 500 MG tablet Commonly known as:  ROBAXIN Take 1 tablet (500 mg total) by mouth 3 (three) times daily as needed.   multivitamin with minerals Tabs tablet Take 1 tablet by mouth daily.   oxyCODONE-acetaminophen 5-325 MG tablet Commonly known as:  ROXICET Take 1-2 tablets by mouth every 4 (four) hours as needed for severe pain.   PRESERVISION AREDS 2+MULTI VIT PO Take 1 capsule by mouth 2 (two) times daily.   sertraline 50 MG tablet Commonly known as:  ZOLOFT Take 50 mg by mouth every evening. 1 tablet daily   SUMAtriptan 100 MG tablet Commonly known as:  IMITREX Take 100 mg by mouth every 2 (two) hours as needed for migraine.   topiramate 50 MG tablet Commonly known as:  TOPAMAX Take 100 mg by mouth every evening.       Signed: Tyjuan Demetro,STEVEN R 01/04/2017, 7:01 AM

## 2017-01-04 NOTE — Progress Notes (Signed)
Orthopedics Progress Note  Subjective: Patient doing well this morning.  Pain under control  Objective:  Vitals:   01/03/17 2356 01/04/17 0400  BP: (!) 92/58 103/64  Pulse: 81 72  Resp: 18 18  Temp: 98.5 F (36.9 C) 98 F (36.7 C)  SpO2: 97% 97%    General: Awake and alert  Musculoskeletal: right shoulder wound looks great.  Dressing changed to Aquacel Neurovascularly intact  Lab Results  Component Value Date   WBC 6.8 12/27/2016   HGB 10.6 (L) 01/04/2017   HCT 34.0 (L) 01/04/2017   MCV 93.7 12/27/2016   PLT 235 12/27/2016       Component Value Date/Time   NA 129 (L) 01/04/2017 0444   K 3.6 01/04/2017 0444   CL 96 (L) 01/04/2017 0444   CO2 20 (L) 01/04/2017 0444   GLUCOSE 403 (H) 01/04/2017 0444   BUN 16 01/04/2017 0444   CREATININE 0.84 01/04/2017 0444   CREATININE 0.82 08/18/2012 0933   CALCIUM 8.3 (L) 01/04/2017 0444   GFRNONAA >60 01/04/2017 0444   GFRNONAA 78 08/18/2012 0933   GFRAA >60 01/04/2017 0444   GFRAA >89 08/18/2012 0933    Lab Results  Component Value Date   INR 0.99 05/08/2012   INR 0.99 03/02/2012   INR 1.07 12/05/2011    Assessment/Plan: POD #1 s/p Procedure(s): RIGHT SHOULDER ANATOMIC SHOULDER REPLACEMENT  Stable overnight.  Plan discharge to home after OT Follow up in two weeks. Ok for gentle ADLs, minimal WB  Almedia Balls. Ranell Patrick, MD 01/04/2017 6:58 AM

## 2017-01-04 NOTE — Progress Notes (Signed)
Patient is discharged from room 3C04 at this time. Alert and in stable condition. IV site d/c'd and instructions read to patient and sister with understanding verbalized. Left unit via wheelchair with all belongings at side.

## 2017-01-04 NOTE — Evaluation (Signed)
Occupational Therapy Evaluation Patient Details Name: Melissa Leach MRN: [id number] DOB: 17-Mar-1951 Today's Date: 01/04/2017    History of Present Illness Pt is a 66 y.o. female s/p R shoulder anatomic shoulder replacement and rotator cuff repair. PMH significant for anemia, anxiety, arthritis, chronic back pain, depression, gastritis, GERD, headaches, hyperlipidemia, hypothyroidism, insomnia, lichenoid dermatitis, nocturia, Past surgical history includes: R THA, L THA, R TKA, L TKA, and lumbar fusion.    Clinical Impression   PTA, pt was independent with ADL and functional mobility and living alone. She currently requires min assist for UB and LB dressing tasks while adhering to conservative shoulder precautions, supervision for UB bathing tasks, and supervision for toilet transfers and hygiene. Pt educated concerning conservative shoulder protocol elbow/wrist/hand AROM as well as compensatory strategies for dressing/bathing in order to maximize adherence to precautions. Pt verbalizes understanding but requires VC's to maintain adherence to precautions throughout tasks. Recommend 24 hour assistance initially post-acute D/C to maximize safety and pt reports her sister will be able to provide this. OT will continue to follow while admitted.    Follow Up Recommendations  DC plan and follow up therapy as arranged by surgeon;Supervision/Assistance - 24 hour    Equipment Recommendations  None recommended by OT    Recommendations for Other Services       Precautions / Restrictions Precautions Precautions: Shoulder Type of Shoulder Precautions: Conservative protocol: sling at all times except for dressing/bathing/exercises, NWB R UE ("limit WB" stated in order comments), R elbow/wrist/hand AROM, no AROM/PROM R shoulder, "OK for gentle ADL" in order comments.  Shoulder Interventions: Shoulder sling/immobilizer;Off for dressing/bathing/exercises Precaution Booklet Issued: Yes  (comment) Precaution Comments: Educated pt on conservative shoulder protocol and ADL compensatory strategies.  Required Braces or Orthoses: Sling (R shoulder) Restrictions Weight Bearing Restrictions: No      Mobility Bed Mobility Overal bed mobility: Needs Assistance Bed Mobility: Rolling;Sidelying to Sit Rolling: Supervision Sidelying to sit: Supervision       General bed mobility comments: Supervision for safety. VC's for technique and to enter/exit bed from the L to protect shoulder.   Transfers Overall transfer level: Needs assistance Equipment used: None Transfers: Sit to/from Stand Sit to Stand: Supervision         General transfer comment: Sueprvision for safety wtih VC's to avoid pushing up with R UE    Balance Overall balance assessment: No apparent balance deficits (not formally assessed)                                         ADL either performed or assessed with clinical judgement   ADL Overall ADL's : Needs assistance/impaired Eating/Feeding: Set up;Sitting   Grooming: Supervision/safety;Standing   Upper Body Bathing: Supervision/ safety;Standing;Cueing for UE precautions   Lower Body Bathing: Supervison/ safety;Sit to/from stand Lower Body Bathing Details (indicate cue type and reason): Cues for R UE precautions.  Upper Body Dressing : Minimal assistance;Sitting Upper Body Dressing Details (indicate cue type and reason): Min assist to maneuver sleeve over R shoulder Lower Body Dressing: Minimal assistance;Sit to/from stand   Toilet Transfer: Supervision/safety;Ambulation   Toileting- Clothing Manipulation and Hygiene: Supervision/safety;Sit to/from stand       Functional mobility during ADLs: Supervision/safety General ADL Comments: Pt requiring VC's throughout activity to avoid pulling with R UE. Educated on compensatory dressing, bathing, and grooming strategies to maximize adherence to precautions.      Vision Patient  Visual Report: No change from baseline Vision Assessment?: No apparent visual deficits     Perception     Praxis      Pertinent Vitals/Pain Pain Assessment: Faces Faces Pain Scale: Hurts little more Pain Location: R shoulder Pain Descriptors / Indicators: Aching;Operative site guarding;Sore Pain Intervention(s): Limited activity within patient's tolerance;Monitored during session;Premedicated before session;Repositioned     Hand Dominance     Extremity/Trunk Assessment Upper Extremity Assessment Upper Extremity Assessment: RUE deficits/detail RUE Deficits / Details: Decreased strength and ROM per protocol as expected post-operatively. Reports R hand sensation is returning but tingling remains.  RUE: Unable to fully assess due to pain;Unable to fully assess due to immobilization RUE Sensation: decreased light touch   Lower Extremity Assessment Lower Extremity Assessment: Overall WFL for tasks assessed       Communication Communication Communication: No difficulties   Cognition Arousal/Alertness: Awake/alert Behavior During Therapy: WFL for tasks assessed/performed Overall Cognitive Status: Within Functional Limits for tasks assessed                                     General Comments       Exercises Exercises: Shoulder Shoulder Exercises Elbow Flexion: AROM;Right;10 reps;Standing Wrist Flexion: AROM;Right;10 reps;Seated Digit Composite Flexion: AROM;Right;10 reps;Seated   Shoulder Instructions Shoulder Instructions Donning/doffing shirt without moving shoulder: Minimal assistance Method for sponge bathing under operated UE: Supervision/safety Donning/doffing sling/immobilizer: Minimal assistance Correct positioning of sling/immobilizer: Supervision/safety ROM for elbow, wrist and digits of operated UE: Supervision/safety Sling wearing schedule (on at all times/off for ADL's): Supervision/safety Proper positioning of operated UE when showering:  Supervision/safety Positioning of UE while sleeping: Supervision/safety    Home Living Family/patient expects to be discharged to:: Private residence Living Arrangements: Alone Available Help at Discharge: Family;Available 24 hours/day (sister for a few days) Type of Home: House Home Access: Stairs to enter [employer] of Steps: 1   Home Layout: Multi-level;Able to live on main level with bedroom/bathroom     Bathroom Shower/Tub: Producer, television/film/video: Handicapped height Bathroom Accessibility: Yes   Home Equipment: Bedside commode;Shower seat;Grab bars - toilet;Grab bars - tub/shower;Hand held shower head          Prior Functioning/Environment Level of Independence: Independent                 OT Problem List: Decreased strength;Decreased range of motion;Decreased activity tolerance;Impaired balance (sitting and/or standing);Impaired sensation;Decreased knowledge of use of DME or AE;Decreased knowledge of precautions;Cardiopulmonary status limiting activity;Pain;Impaired UE functional use      OT Treatment/Interventions: Self-care/ADL training;Therapeutic exercise;Energy conservation;DME and/or AE instruction;Therapeutic activities;Patient/family education;Balance training    OT Goals(Current goals can be found in the care plan section) Acute Rehab OT Goals Patient Stated Goal: to go home soon OT Goal Formulation: With patient Time For Goal Achievement: 01/18/17 Potential to Achieve Goals: Good ADL Goals Pt Will Perform Upper Body Dressing: sitting;with modified independence Pt Will Perform Lower Body Dressing: with modified independence;sit to/from stand Pt/caregiver will Perform Home Exercise Program: Right Upper extremity;With written HEP provided;With Supervision (R elbow/wrist/hand AROM per protocol) Additional ADL Goal #1: Pt will adhere to NWB R UE during toilet transfer with no verbal reminders.   OT Frequency: Min 2X/week   Barriers  to D/C:            Co-evaluation              AM-PAC PT "6 Clicks" Daily  Activity     Outcome Measure Help from another person eating meals?: A Little Help from another person taking care of personal grooming?: A Little Help from another person toileting, which includes using toliet, bedpan, or urinal?: A Little Help from another person bathing (including washing, rinsing, drying)?: A Little Help from another person to put on and taking off regular upper body clothing?: A Little Help from another person to put on and taking off regular lower body clothing?: A Little 6 Click Score: 18   End of Session Equipment Utilized During Treatment:  (R shoulder sling) Nurse Communication: Mobility status (pt up in room)  Activity Tolerance: Patient tolerated treatment well Patient left:  (in bathroom preparing to do her makeup)  OT Visit Diagnosis: Pain Pain - Right/Left: Right Pain - part of body: Shoulder                Time: 0826-0900 OT Time Calculation (min): 34 min Charges:  OT General Charges $OT Visit: 1 Visit OT Evaluation $OT Eval Moderate Complexity: 1 Mod OT Treatments $Self Care/Home Management : 8-22 mins G-Codes:     Doristine Section, MS OTR/L  Pager: [phone]   Melissa Leach A Melissa Leach 01/04/2017, 10:12 AM

## 2017-01-05 ENCOUNTER — Encounter (HOSPITAL_COMMUNITY): Payer: Self-pay | Admitting: Orthopedic Surgery

## 2017-01-06 NOTE — Op Note (Signed)
NAME:  Melissa Leach, Melissa Leach                     ACCOUNT NO.:  MEDICAL RECORD NO.:  [id number]  LOCATION:                                 FACILITY:  PHYSICIAN:  Almedia Balls. Ranell Patrick, M.D.      DATE OF BIRTH:  DATE OF PROCEDURE:  01/03/2017 DATE OF DISCHARGE:                              OPERATIVE REPORT   PREOPERATIVE DIAGNOSIS:  Right shoulder end-stage osteoarthritis.  POSTOPERATIVE DIAGNOSIS:  Right shoulder end-stage osteoarthritis.  PROCEDURE PERFORMED:  Right shoulder total shoulder arthroplasty using DePuy Global Unite System.  ATTENDING SURGEON:  Almedia Balls. Ranell Patrick, MD.  ASSISTANT:  Konrad Felix Dixon, [facility], who scrubbed the entire procedure and necessary for satisfactory completion of surgery.  ANESTHESIA:  General anesthesia was used plus interscalene block.  ESTIMATED BLOOD LOSS:  200 mL.  FLUID REPLACEMENT:  1500 mL of crystalloid.  INSTRUMENT COUNTS:  Correct.  COMPLICATIONS:  There were no complications.  Perioperative antibiotics were given.  INDICATIONS:  The patient is a 66 year old female with progressively worsening right shoulder pain secondary to end-stage osteoarthritis. The patient has large osteophytes present within the shoulder.  She has bone-on-bone on x-ray and MRI scan.  Edema present within the humeral head and the glenoid.  Having failed extensive period of conservative management, she desires operative treatment to restore function and eliminate pain to her shoulder.  Informed consent obtained.  DESCRIPTION OF PROCEDURE:  After an adequate level of anesthesia was achieved, the patient was positioned in the modified beach-chair position.  Right shoulder correctly was identified and sterilely prepped and draped in the usual manner.  Time-out called.  After a sterile prep and drape of the shoulder and arm and verification of correct patient and correct site, we entered the shoulder using standard anterior deltopectoral incision, starting at the coracoid  process and extending down to the anterior humerus with a #10 blade scalpel, dissection down through subcutaneous tissues using Bovie.  Identified cephalic vein, took it laterally with the deltoid, pectoralis taken medially. Conjoined tendon identified and retracted medially.  We performed an in- situ biceps tenodesis with 0 Vicryl figure-of-eight suture x2 incorporating into the pectoralis tendon more distally.  We then went ahead and released the subscapularis off the lesser tuberosity and tagged for repair at the end with #2 FiberWire suture with a modified W- stitch.  Next, we released the inferior capsule of the inferior humeral neck area, progressively externally rotating the humerus.  We then placed a T-handle Crego elevator over the humeral head and underneath rotator cuff to protect that, and then, we placed a Crego medially and externally rotated approximately 30 degrees.  We performed our anterior to posterior neck cut using a neck resection guide and that was done with an oscillating saw.  We irrigated thoroughly and then removed the head and also the remaining osteophytes off the anteromedial and posterior aspect of the humeral neck.  Once we had that portion of the humeral preparation performed, we subluxed the humerus posteriorly.  We then went ahead and retracted such we could get a 360-degree view of the glenoid.  We did a glenoid capsule labral removal and removed the biceps stump and  the superior labrum as well as anterior, inferior, posterior labrum and capsule.  We freed up the subscapularis so it could move freely.  Once we had good exposure of the glenoid, we placed our central guide pin for the size 40 glenoid and used the APG or anchor peg glenoid system with DePuy.  Once we had that central guide pin placed and verified its position, we reamed for the 40 glenoid and then did our peripheral hand reaming.  We drilled our central peg hole as well as our 3 peripheral  holes as well, centering on the inferior scapular neck. Once we had those holes drilled, we used Gelfoam soaked in thrombin for the 3 peripheral holes to dry those in vacuum mixed DePuy high-viscosity cement on the back table.  We pressurized that in the 3 peripheral holes and impacted the real APG glenoid implant into position.  Once that was stable, we held that until cement hardened.  Next, we went ahead and finished our preparation on the humeral side, reaming only up to a size 8 as she had some pretty significant distal thinning of her humerus, but we were able to get a 6 size 8 reamer down to the appropriate depth and then we broached for the size 8 humeral component.  Once we had that broaching done, we put the trial size 8 body in 8 metaphysis, impacted that into position, 30 degrees retroversion, placed our initially 44 x 18 eccentric head in place and then went with a 44 x 21 to get a little better stability.  We had some slight increased laxity more than I like with the 18 and we could translate more than 50% width of the glenoid and with the 21, it was perfect with 50% inferior pole and 50% posterior translation.  We removed all trial components from the humeral side, irrigated thoroughly, drilled holes in the lesser tuberosity and placed #2 FiberWire suture in that.  Next, we used available bone graft from the humeral head and used impaction grafting technique to impact the press-fit size 8 stem and 8 metaphysis in 30 degrees of retroversion. We impacted that in place and then we selected 44 x 21 eccentric and impacted that with the Centricity dialed superiorly and posteriorly.  We had excellent coverage with that.  We reduced the shoulder, perfect stability, irrigated thoroughly and then repaired the subscapularis anatomically back to bone with drill holes through the bone, the sutures we placed previously and also repairing the rotator interval.  We were very pleased with  our repair, ranged the shoulder, very stable, irrigated thoroughly and then closed deltopectoral interval with 0 Vicryl suture followed by 2-0 Vicryl for subcutaneous closure and 4-0 Monocryl for skin.  Steri-Strips applied followed by sterile dressing. The patient tolerated the surgery well.     Almedia Balls. Ranell Patrick, M.D.     SRN/MEDQ  D:  01/03/2017  T:  01/03/2017  Job:  [id number]

## 2017-01-16 DIAGNOSIS — Z471 Aftercare following joint replacement surgery: Secondary | ICD-10-CM | POA: Diagnosis not present

## 2017-01-16 DIAGNOSIS — Z96611 Presence of right artificial shoulder joint: Secondary | ICD-10-CM | POA: Diagnosis not present

## 2017-01-27 DIAGNOSIS — M25611 Stiffness of right shoulder, not elsewhere classified: Secondary | ICD-10-CM | POA: Diagnosis not present

## 2017-01-31 DIAGNOSIS — M25611 Stiffness of right shoulder, not elsewhere classified: Secondary | ICD-10-CM | POA: Diagnosis not present

## 2017-02-03 DIAGNOSIS — H1033 Unspecified acute conjunctivitis, bilateral: Secondary | ICD-10-CM | POA: Diagnosis not present

## 2017-02-05 DIAGNOSIS — M25611 Stiffness of right shoulder, not elsewhere classified: Secondary | ICD-10-CM | POA: Diagnosis not present

## 2017-02-06 DIAGNOSIS — H43812 Vitreous degeneration, left eye: Secondary | ICD-10-CM | POA: Diagnosis not present

## 2017-02-10 DIAGNOSIS — M25611 Stiffness of right shoulder, not elsewhere classified: Secondary | ICD-10-CM | POA: Diagnosis not present

## 2017-02-12 DIAGNOSIS — M25611 Stiffness of right shoulder, not elsewhere classified: Secondary | ICD-10-CM | POA: Diagnosis not present

## 2017-02-13 DIAGNOSIS — Z96611 Presence of right artificial shoulder joint: Secondary | ICD-10-CM | POA: Diagnosis not present

## 2017-02-13 DIAGNOSIS — Z471 Aftercare following joint replacement surgery: Secondary | ICD-10-CM | POA: Diagnosis not present

## 2017-02-17 DIAGNOSIS — M25611 Stiffness of right shoulder, not elsewhere classified: Secondary | ICD-10-CM | POA: Diagnosis not present

## 2017-02-24 DIAGNOSIS — M25611 Stiffness of right shoulder, not elsewhere classified: Secondary | ICD-10-CM | POA: Diagnosis not present

## 2017-02-26 DIAGNOSIS — M25611 Stiffness of right shoulder, not elsewhere classified: Secondary | ICD-10-CM | POA: Diagnosis not present

## 2017-03-03 DIAGNOSIS — M25611 Stiffness of right shoulder, not elsewhere classified: Secondary | ICD-10-CM | POA: Diagnosis not present

## 2017-03-06 DIAGNOSIS — M25611 Stiffness of right shoulder, not elsewhere classified: Secondary | ICD-10-CM | POA: Diagnosis not present

## 2017-03-12 DIAGNOSIS — M25611 Stiffness of right shoulder, not elsewhere classified: Secondary | ICD-10-CM | POA: Diagnosis not present

## 2017-03-17 DIAGNOSIS — Z471 Aftercare following joint replacement surgery: Secondary | ICD-10-CM | POA: Diagnosis not present

## 2017-03-17 DIAGNOSIS — Z96611 Presence of right artificial shoulder joint: Secondary | ICD-10-CM | POA: Diagnosis not present

## 2017-03-18 DIAGNOSIS — M25611 Stiffness of right shoulder, not elsewhere classified: Secondary | ICD-10-CM | POA: Diagnosis not present

## 2017-03-27 DIAGNOSIS — Z96611 Presence of right artificial shoulder joint: Secondary | ICD-10-CM | POA: Diagnosis not present

## 2017-03-27 DIAGNOSIS — Z471 Aftercare following joint replacement surgery: Secondary | ICD-10-CM | POA: Diagnosis not present

## 2017-03-28 DIAGNOSIS — H6691 Otitis media, unspecified, right ear: Secondary | ICD-10-CM | POA: Diagnosis not present

## 2017-03-28 DIAGNOSIS — J0100 Acute maxillary sinusitis, unspecified: Secondary | ICD-10-CM | POA: Diagnosis not present

## 2017-04-07 DIAGNOSIS — M25611 Stiffness of right shoulder, not elsewhere classified: Secondary | ICD-10-CM | POA: Diagnosis not present

## 2017-04-08 DIAGNOSIS — Z0101 Encounter for examination of eyes and vision with abnormal findings: Secondary | ICD-10-CM | POA: Diagnosis not present

## 2017-04-10 DIAGNOSIS — M25611 Stiffness of right shoulder, not elsewhere classified: Secondary | ICD-10-CM | POA: Diagnosis not present

## 2017-04-14 DIAGNOSIS — M25611 Stiffness of right shoulder, not elsewhere classified: Secondary | ICD-10-CM | POA: Diagnosis not present

## 2017-04-22 DIAGNOSIS — M25611 Stiffness of right shoulder, not elsewhere classified: Secondary | ICD-10-CM | POA: Diagnosis not present

## 2017-04-24 DIAGNOSIS — M25611 Stiffness of right shoulder, not elsewhere classified: Secondary | ICD-10-CM | POA: Diagnosis not present

## 2017-04-29 DIAGNOSIS — M25511 Pain in right shoulder: Secondary | ICD-10-CM | POA: Diagnosis not present

## 2017-04-29 DIAGNOSIS — Z96611 Presence of right artificial shoulder joint: Secondary | ICD-10-CM | POA: Diagnosis not present

## 2017-04-29 DIAGNOSIS — Z471 Aftercare following joint replacement surgery: Secondary | ICD-10-CM | POA: Diagnosis not present

## 2017-05-05 DIAGNOSIS — E039 Hypothyroidism, unspecified: Secondary | ICD-10-CM | POA: Diagnosis not present

## 2017-05-05 DIAGNOSIS — R69 Illness, unspecified: Secondary | ICD-10-CM | POA: Diagnosis not present

## 2017-05-05 DIAGNOSIS — M25511 Pain in right shoulder: Secondary | ICD-10-CM | POA: Diagnosis not present

## 2017-05-05 DIAGNOSIS — Z23 Encounter for immunization: Secondary | ICD-10-CM | POA: Diagnosis not present

## 2017-05-05 DIAGNOSIS — Z79899 Other long term (current) drug therapy: Secondary | ICD-10-CM | POA: Diagnosis not present

## 2017-05-05 DIAGNOSIS — K219 Gastro-esophageal reflux disease without esophagitis: Secondary | ICD-10-CM | POA: Diagnosis not present

## 2017-05-06 ENCOUNTER — Other Ambulatory Visit: Payer: Self-pay | Admitting: Orthopedic Surgery

## 2017-05-06 DIAGNOSIS — M25511 Pain in right shoulder: Secondary | ICD-10-CM | POA: Insufficient documentation

## 2017-05-06 DIAGNOSIS — M25611 Stiffness of right shoulder, not elsewhere classified: Secondary | ICD-10-CM | POA: Diagnosis not present

## 2017-05-06 DIAGNOSIS — Z96611 Presence of right artificial shoulder joint: Secondary | ICD-10-CM

## 2017-05-08 ENCOUNTER — Ambulatory Visit
Admission: RE | Admit: 2017-05-08 | Discharge: 2017-05-08 | Disposition: A | Discharge status: Home / Self Care | Payer: Medicare HMO | Source: Ambulatory Visit | Attending: Orthopedic Surgery | Admitting: Orthopedic Surgery

## 2017-05-08 DIAGNOSIS — Z471 Aftercare following joint replacement surgery: Secondary | ICD-10-CM | POA: Diagnosis not present

## 2017-05-08 DIAGNOSIS — Z96611 Presence of right artificial shoulder joint: Secondary | ICD-10-CM

## 2017-05-12 DIAGNOSIS — Z1231 Encounter for screening mammogram for malignant neoplasm of breast: Secondary | ICD-10-CM | POA: Diagnosis not present

## 2017-05-21 ENCOUNTER — Ambulatory Visit: Payer: Managed Care, Other (non HMO) | Admitting: Nurse Practitioner

## 2017-05-21 ENCOUNTER — Ambulatory Visit: Payer: Managed Care, Other (non HMO) | Admitting: Certified Nurse Midwife

## 2017-05-29 DIAGNOSIS — R69 Illness, unspecified: Secondary | ICD-10-CM | POA: Diagnosis not present

## 2017-05-30 ENCOUNTER — Encounter: Payer: Self-pay | Admitting: Certified Nurse Midwife

## 2017-05-30 ENCOUNTER — Ambulatory Visit (INDEPENDENT_AMBULATORY_CARE_PROVIDER_SITE_OTHER): Payer: Medicare HMO | Admitting: Certified Nurse Midwife

## 2017-05-30 VITALS — BP 110/70 | Ht 60.75 in | Wt 188.0 lb

## 2017-05-30 DIAGNOSIS — N951 Menopausal and female climacteric states: Secondary | ICD-10-CM | POA: Diagnosis not present

## 2017-05-30 DIAGNOSIS — I10 Essential (primary) hypertension: Secondary | ICD-10-CM

## 2017-05-30 DIAGNOSIS — L989 Disorder of the skin and subcutaneous tissue, unspecified: Secondary | ICD-10-CM

## 2017-05-30 DIAGNOSIS — Z01419 Encounter for gynecological examination (general) (routine) without abnormal findings: Secondary | ICD-10-CM | POA: Diagnosis not present

## 2017-05-30 NOTE — Patient Instructions (Signed)

## 2017-05-30 NOTE — Progress Notes (Signed)
67 y.o. 332P2002 Widowed  Caucasian Fe here for annual exam. Post menopausal denies vaginal bleeding or vaginal dryness. Had shoulder replacement on right due to fall, still under follow up. Sees PCP for aex/labs/hypothyroid,depression, migraine headache,cholesterol management. All medications stable per patient. Complaining of perineal irritation from wiping with bowel movement or perspiration. Please check. No other health issues today. Will be traveling out of state soon.  Patient's last menstrual period was 12/27/2002.          Sexually active: No.  The current method of family planning is post menopausal status.    Exercising: Yes.    walking the dog Smoker:  no  Health Maintenance: Pap:  05-16-15 neg HPV HR neg History of Abnormal Pap: no MMG:  2019 neg Self Breast exams: no Colonoscopy:  2009  BMD:   2018 TDaP:  2011 Shingles: 2014, 2019 Pneumonia: had done Hep C and HIV: HIV neg 2014, Hep c neg 2017 Labs: no PCP   reports that she quit smoking about 40 years ago. Her smoking use included cigarettes. She smoked 0.50 packs per day. she has never used smokeless tobacco. She reports that she drinks about 1.2 oz of alcohol per week. She reports that she does not use drugs.  Past Medical History:  Diagnosis Date  . Anemia   . Anxiety   . Arthritis    SEVERE PAIN RIGHT HIP  . Blood in urine    "ALWAYS"  --AND STATES ALWAYS TREATED FOR UTI--BUT HAS NEVER HAD UTI.  Marland Kitchen. Chronic back pain   . Complication of anesthesia    "SCRATCHED CORNEA"  WAKING UP FROM  KNEE SURGERY--'82  . Depression    PT'S [facility]  . Gastritis    HISTORY OF GASTRITIS--OCCAS ABDOMINAL PAIN   . GERD (gastroesophageal reflux disease)    takes Omeprazole daily and states only bc she takes Mobic  . H/O hiatal hernia   . Headache(784.0)    MIGRAINE- INFREQUENT  . History of migraine    takes Topamax daily and Imitrex prn  . Hyperlipidemia    takes Lipitor daily  . Hypothyroidism  05/13/12  . Insomnia    takes Xanax and Ativan prn  . Joint pain   . Joint swelling   . Lichenoid dermatitis 03/16/2007   perianal biopsy  . Nocturia     Past Surgical History:  Procedure Laterality Date  . COLONOSCOPY    . epidural injections     x 2 in the past 4weeks  . ESOPHAGOGASTRODUODENOSCOPY    . EYE SURGERY  1994   RA surgery-  . HEMATOMA EVACUATION  12/09/2011   Procedure: EVACUATION HEMATOMA;  Surgeon: [name] PalMatthew D Olin, MD;  Location: [facility];  Service: Orthopedics;  Laterality: Right;  Evacuation of Hematoma Right Knee, I & D and Closed Manipulation of Right Knee  . IRRIGATION AND DEBRIDEMENT KNEE  12/09/2011   Procedure: IRRIGATION AND DEBRIDEMENT KNEE;  Surgeon: [name] PalMatthew D Olin, MD;  Location: [facility];  Service: Orthopedics;  Laterality: Right;  . JOINT REPLACEMENT     RIGHT TOTAL KNEE REPLACEMENT AND RT TOTAL KNEE REVISION    AND  LEFT TOTAL KNEE REPLACEMENT  . KNEE CLOSED REDUCTION  12/09/2011   Procedure: CLOSED MANIPULATION KNEE;  Surgeon: [name] PalMatthew D Olin, MD;  Location: [facility];  Service: Orthopedics;  Laterality: Right;  . LUMBAR SPINE SURGERY  05/15/12   3 disc fusion  . TONSILLECTOMY    . TOTAL HIP ARTHROPLASTY  06/25/2011  Procedure: TOTAL HIP ARTHROPLASTY ANTERIOR APPROACH;  Surgeon: [name], MD;  Location: [facility];  Service: Orthopedics;  Laterality: Left;  . TOTAL HIP ARTHROPLASTY  03/02/2012   Procedure: TOTAL HIP ARTHROPLASTY ANTERIOR APPROACH;  Surgeon: [name], MD;  Location: [facility];  Service: Orthopedics;  Laterality: Right;  . TOTAL KNEE REVISION  10/28/2011   Procedure: TOTAL KNEE REVISION;  Surgeon: [name], MD;  Location: [facility];  Service: Orthopedics;  Laterality: Right;  . TOTAL SHOULDER ARTHROPLASTY Right 01/03/2017   Procedure: RIGHT SHOULDER ANATOMIC SHOULDER REPLACEMENT AND ROTATOR CUFF REPAIR;  Surgeon: Beverely Low, MD;  Location: [facility];  Service: Orthopedics;  Laterality: Right;  . TUBAL LIGATION    . WOUND EXPLORATION Bilateral  07/06/2012   Procedure: WOUND EXPLORATION lumbar;  Surgeon: Temple Pacini, MD;  Location: [facility];  Service: Neurosurgery;  Laterality: Bilateral;  Incision and Drainage    Current Outpatient Medications  Medication Sig Dispense Refill  . atorvastatin (LIPITOR) 40 MG tablet Take 20 mg by mouth every evening.     . Azelaic Acid (FINACEA) 15 % cream Apply 1 application topically 2 (two) times daily as needed. After skin is thoroughly washed and patted dry, gently but thoroughly massage a thin film of azelaic acid cream into the affected area twice daily, in the morning and evening.    . betamethasone valerate ointment (VALISONE) 0.1 % Apply topically 2 (two) times daily. (Patient taking differently: Apply 1 application topically 2 (two) times daily as needed. ) 45 g 6  . Calcium Carb-Cholecalciferol (CALCIUM 600+D3 PO) Take 1 tablet by mouth 2 (two) times daily.    Marland Kitchen levothyroxine (SYNTHROID, LEVOTHROID) 50 MCG tablet Take 50 mcg by mouth every other day.     . levothyroxine (SYNTHROID, LEVOTHROID) 75 MCG tablet qod  9  . loratadine (CLARITIN) 10 MG tablet Take 10 mg by mouth daily. Allergy    . meloxicam (MOBIC) 15 MG tablet Take 15 mg by mouth daily.    . Multiple Vitamin (MULTIVITAMIN WITH MINERALS) TABS Take 1 tablet by mouth daily.    . Multiple Vitamins-Minerals (PRESERVISION AREDS 2+MULTI VIT PO) Take 1 capsule by mouth 2 (two) times daily.    . sertraline (ZOLOFT) 50 MG tablet Take 75 mg by mouth every evening. 1 tablet daily    . SUMAtriptan (IMITREX) 100 MG tablet Take 100 mg by mouth every 2 (two) hours as needed for migraine.     . topiramate (TOPAMAX) 50 MG tablet Take 100 mg by mouth every evening.    Marland Kitchen amoxicillin (AMOXIL) 500 MG tablet Take 2,000 mg by mouth See admin instructions. Pt takes before dental procedures     No current facility-administered medications for this visit.     Family History  Problem Relation Age of Onset  . Diabetes Mother   . Hypertension Mother    . Thyroid disease Mother   . Thyroid disease Father   . Heart failure Father   . Hypertension Father     ROS:  Pertinent items are noted in HPI.  Otherwise, a comprehensive ROS was negative.  Exam:   BP 110/70   Ht 5' 0.75" (1.543 m)   Wt 188 lb (85.3 kg)   LMP 12/27/2002   BMI 35.82 kg/m  Height: 5' 0.75" (154.3 cm) Ht Readings from Last 3 Encounters:  05/30/17 5' 0.75" (1.543 m)  01/03/17 5\' 1"  (1.549 m)  12/27/16 5\' 1"  (1.549 m)    General appearance: alert, cooperative and  appears stated age Head: Normocephalic, without obvious abnormality, atraumatic Neck: no adenopathy, supple, symmetrical, trachea midline and thyroid normal to inspection and palpation Lungs: clear to auscultation bilaterally Breasts: normal appearance, no masses or tenderness, No nipple retraction or dimpling, No nipple discharge or bleeding, No axillary or supraclavicular adenopathy Heart: regular rate and rhythm Abdomen: soft, non-tender; no masses,  no organomegaly Extremities: extremities normal, atraumatic, no cyanosis or edema Skin: Skin color, texture, turgor normal. No rashes or lesions Lymph nodes: Cervical, supraclavicular, and axillary nodes normal. No abnormal inguinal nodes palpated Neurologic: Grossly normal   Pelvic: External genitalia:  no lesions, normal female, thinning skin noted from area below fourchette to anal opening with no broken skin. Non tender to touch              Urethra:  normal appearing urethra with no masses, tenderness or lesions              Bartholin's and Skene's: normal                 Vagina: normal appearing vagina with normal color and discharge, no lesions              Cervix: multiparous appearance, no cervical motion tenderness and no lesions              Pap taken: No. Bimanual Exam:  Uterus:  normal size, contour, position, consistency, mobility, non-tender              Adnexa: normal adnexa and no mass, fullness, tenderness                Rectovaginal: Confirms               Anus:  normal sphincter tone, no lesions  Chaperone present: yes  A:  Well Woman with normal exam  Menopausal no HRT  Perineal irritation  Hypothyroid, cholesterol,migraine headache, depression management with MD    P:   Reviewed health and wellness pertinent to exam  Discussed importance of evaluation if vaginal bleeding or if having vaginal dryness issues  Discussed perineal findings of irritation and overuse of Betamethasone ointment. Discussed this has increased thin skin appearance and need just to protect area. Discuss Balmex cream to area daily until more comfortable. Please advise if no change.  Continue follow up with PCP as indicated  Pap smear: no   counseled on breast self exam, mammography screening, feminine hygiene, adequate intake of calcium and vitamin D, diet and exercise, Kegel's exercises  return annually or prn  An After Visit Summary was printed and given to the patient.

## 2017-06-17 DIAGNOSIS — M25511 Pain in right shoulder: Secondary | ICD-10-CM | POA: Diagnosis not present

## 2017-07-18 DIAGNOSIS — J019 Acute sinusitis, unspecified: Secondary | ICD-10-CM | POA: Diagnosis not present

## 2017-08-21 DIAGNOSIS — S76311A Strain of muscle, fascia and tendon of the posterior muscle group at thigh level, right thigh, initial encounter: Secondary | ICD-10-CM | POA: Diagnosis not present

## 2017-08-21 DIAGNOSIS — M1711 Unilateral primary osteoarthritis, right knee: Secondary | ICD-10-CM | POA: Diagnosis not present

## 2017-08-21 DIAGNOSIS — Z471 Aftercare following joint replacement surgery: Secondary | ICD-10-CM | POA: Diagnosis not present

## 2017-08-21 DIAGNOSIS — Z96651 Presence of right artificial knee joint: Secondary | ICD-10-CM | POA: Diagnosis not present

## 2017-09-23 DIAGNOSIS — Z96611 Presence of right artificial shoulder joint: Secondary | ICD-10-CM | POA: Diagnosis not present

## 2017-12-02 DIAGNOSIS — E8881 Metabolic syndrome: Secondary | ICD-10-CM | POA: Diagnosis not present

## 2017-12-02 DIAGNOSIS — E782 Mixed hyperlipidemia: Secondary | ICD-10-CM | POA: Diagnosis not present

## 2017-12-02 DIAGNOSIS — Z23 Encounter for immunization: Secondary | ICD-10-CM | POA: Diagnosis not present

## 2017-12-02 DIAGNOSIS — E039 Hypothyroidism, unspecified: Secondary | ICD-10-CM | POA: Diagnosis not present

## 2017-12-02 DIAGNOSIS — H6121 Impacted cerumen, right ear: Secondary | ICD-10-CM | POA: Diagnosis not present

## 2017-12-02 DIAGNOSIS — L719 Rosacea, unspecified: Secondary | ICD-10-CM | POA: Diagnosis not present

## 2017-12-02 DIAGNOSIS — Z79899 Other long term (current) drug therapy: Secondary | ICD-10-CM | POA: Diagnosis not present

## 2017-12-24 DIAGNOSIS — R69 Illness, unspecified: Secondary | ICD-10-CM | POA: Diagnosis not present

## 2017-12-25 DIAGNOSIS — L219 Seborrheic dermatitis, unspecified: Secondary | ICD-10-CM | POA: Diagnosis not present

## 2017-12-25 DIAGNOSIS — Z1211 Encounter for screening for malignant neoplasm of colon: Secondary | ICD-10-CM | POA: Diagnosis not present

## 2017-12-25 DIAGNOSIS — R7303 Prediabetes: Secondary | ICD-10-CM | POA: Diagnosis not present

## 2017-12-25 DIAGNOSIS — E039 Hypothyroidism, unspecified: Secondary | ICD-10-CM | POA: Diagnosis not present

## 2017-12-30 DIAGNOSIS — H5203 Hypermetropia, bilateral: Secondary | ICD-10-CM | POA: Diagnosis not present

## 2017-12-30 DIAGNOSIS — H2513 Age-related nuclear cataract, bilateral: Secondary | ICD-10-CM | POA: Diagnosis not present

## 2017-12-30 DIAGNOSIS — H524 Presbyopia: Secondary | ICD-10-CM | POA: Diagnosis not present

## 2017-12-30 DIAGNOSIS — H43812 Vitreous degeneration, left eye: Secondary | ICD-10-CM | POA: Diagnosis not present

## 2017-12-30 DIAGNOSIS — H52223 Regular astigmatism, bilateral: Secondary | ICD-10-CM | POA: Diagnosis not present

## 2018-01-12 DIAGNOSIS — R7303 Prediabetes: Secondary | ICD-10-CM | POA: Diagnosis not present

## 2018-01-12 DIAGNOSIS — E039 Hypothyroidism, unspecified: Secondary | ICD-10-CM | POA: Diagnosis not present

## 2018-01-12 DIAGNOSIS — Z1211 Encounter for screening for malignant neoplasm of colon: Secondary | ICD-10-CM | POA: Diagnosis not present

## 2018-01-12 DIAGNOSIS — E7800 Pure hypercholesterolemia, unspecified: Secondary | ICD-10-CM | POA: Diagnosis not present

## 2018-01-20 DIAGNOSIS — H02834 Dermatochalasis of left upper eyelid: Secondary | ICD-10-CM | POA: Diagnosis not present

## 2018-01-20 DIAGNOSIS — H02413 Mechanical ptosis of bilateral eyelids: Secondary | ICD-10-CM | POA: Diagnosis not present

## 2018-01-20 DIAGNOSIS — H53483 Generalized contraction of visual field, bilateral: Secondary | ICD-10-CM | POA: Diagnosis not present

## 2018-01-20 DIAGNOSIS — H02831 Dermatochalasis of right upper eyelid: Secondary | ICD-10-CM | POA: Diagnosis not present

## 2018-01-20 DIAGNOSIS — H0279 Other degenerative disorders of eyelid and periocular area: Secondary | ICD-10-CM | POA: Diagnosis not present

## 2018-01-29 DIAGNOSIS — L239 Allergic contact dermatitis, unspecified cause: Secondary | ICD-10-CM | POA: Diagnosis not present

## 2018-01-29 DIAGNOSIS — L245 Irritant contact dermatitis due to other chemical products: Secondary | ICD-10-CM | POA: Diagnosis not present

## 2018-03-02 DIAGNOSIS — E039 Hypothyroidism, unspecified: Secondary | ICD-10-CM | POA: Diagnosis not present

## 2018-04-08 DIAGNOSIS — J0100 Acute maxillary sinusitis, unspecified: Secondary | ICD-10-CM | POA: Diagnosis not present

## 2018-04-17 DIAGNOSIS — R7303 Prediabetes: Secondary | ICD-10-CM | POA: Diagnosis not present

## 2018-04-17 DIAGNOSIS — J019 Acute sinusitis, unspecified: Secondary | ICD-10-CM | POA: Diagnosis not present

## 2018-04-17 DIAGNOSIS — E782 Mixed hyperlipidemia: Secondary | ICD-10-CM | POA: Diagnosis not present

## 2018-05-20 DIAGNOSIS — Z1231 Encounter for screening mammogram for malignant neoplasm of breast: Secondary | ICD-10-CM | POA: Diagnosis not present

## 2018-05-20 DIAGNOSIS — Z981 Arthrodesis status: Secondary | ICD-10-CM | POA: Diagnosis not present

## 2018-06-04 DIAGNOSIS — R6889 Other general symptoms and signs: Secondary | ICD-10-CM | POA: Diagnosis not present

## 2018-06-18 ENCOUNTER — Ambulatory Visit: Payer: Medicare HMO | Admitting: Certified Nurse Midwife

## 2018-06-23 ENCOUNTER — Ambulatory Visit: Payer: Medicare HMO | Admitting: Certified Nurse Midwife

## 2018-08-25 DIAGNOSIS — R69 Illness, unspecified: Secondary | ICD-10-CM | POA: Diagnosis not present

## 2018-10-06 DIAGNOSIS — K219 Gastro-esophageal reflux disease without esophagitis: Secondary | ICD-10-CM | POA: Diagnosis not present

## 2018-10-06 DIAGNOSIS — R635 Abnormal weight gain: Secondary | ICD-10-CM | POA: Diagnosis not present

## 2018-10-06 DIAGNOSIS — E039 Hypothyroidism, unspecified: Secondary | ICD-10-CM | POA: Diagnosis not present

## 2018-10-06 DIAGNOSIS — R69 Illness, unspecified: Secondary | ICD-10-CM | POA: Diagnosis not present

## 2018-10-06 DIAGNOSIS — G43909 Migraine, unspecified, not intractable, without status migrainosus: Secondary | ICD-10-CM | POA: Diagnosis not present

## 2018-10-26 DIAGNOSIS — H02839 Dermatochalasis of unspecified eye, unspecified eyelid: Secondary | ICD-10-CM | POA: Diagnosis not present

## 2018-10-26 DIAGNOSIS — H02831 Dermatochalasis of right upper eyelid: Secondary | ICD-10-CM | POA: Diagnosis not present

## 2018-10-26 DIAGNOSIS — H02834 Dermatochalasis of left upper eyelid: Secondary | ICD-10-CM | POA: Diagnosis not present

## 2018-11-02 DIAGNOSIS — R69 Illness, unspecified: Secondary | ICD-10-CM | POA: Diagnosis not present

## 2018-11-26 DIAGNOSIS — B028 Zoster with other complications: Secondary | ICD-10-CM | POA: Diagnosis not present

## 2018-11-26 DIAGNOSIS — R6889 Other general symptoms and signs: Secondary | ICD-10-CM | POA: Diagnosis not present

## 2018-11-26 DIAGNOSIS — B0239 Other herpes zoster eye disease: Secondary | ICD-10-CM | POA: Diagnosis not present

## 2018-11-27 DIAGNOSIS — B0239 Other herpes zoster eye disease: Secondary | ICD-10-CM | POA: Diagnosis not present

## 2018-11-30 DIAGNOSIS — B0239 Other herpes zoster eye disease: Secondary | ICD-10-CM | POA: Diagnosis not present

## 2018-12-03 DIAGNOSIS — Z1211 Encounter for screening for malignant neoplasm of colon: Secondary | ICD-10-CM | POA: Diagnosis not present

## 2018-12-03 DIAGNOSIS — K219 Gastro-esophageal reflux disease without esophagitis: Secondary | ICD-10-CM | POA: Diagnosis not present

## 2018-12-03 DIAGNOSIS — E039 Hypothyroidism, unspecified: Secondary | ICD-10-CM | POA: Diagnosis not present

## 2018-12-03 DIAGNOSIS — Z1210 Encounter for screening for malignant neoplasm of intestinal tract, unspecified: Secondary | ICD-10-CM | POA: Diagnosis not present

## 2018-12-03 DIAGNOSIS — L309 Dermatitis, unspecified: Secondary | ICD-10-CM | POA: Diagnosis not present

## 2018-12-03 DIAGNOSIS — Z6839 Body mass index (BMI) 39.0-39.9, adult: Secondary | ICD-10-CM | POA: Diagnosis not present

## 2018-12-03 DIAGNOSIS — R7303 Prediabetes: Secondary | ICD-10-CM | POA: Diagnosis not present

## 2018-12-03 DIAGNOSIS — Z79899 Other long term (current) drug therapy: Secondary | ICD-10-CM | POA: Diagnosis not present

## 2018-12-03 DIAGNOSIS — E782 Mixed hyperlipidemia: Secondary | ICD-10-CM | POA: Diagnosis not present

## 2018-12-03 DIAGNOSIS — Z23 Encounter for immunization: Secondary | ICD-10-CM | POA: Diagnosis not present

## 2019-01-10 DIAGNOSIS — J340 Abscess, furuncle and carbuncle of nose: Secondary | ICD-10-CM | POA: Diagnosis not present

## 2019-01-14 DIAGNOSIS — R69 Illness, unspecified: Secondary | ICD-10-CM | POA: Diagnosis not present

## 2019-01-21 DIAGNOSIS — R69 Illness, unspecified: Secondary | ICD-10-CM | POA: Diagnosis not present

## 2019-01-26 DIAGNOSIS — Z0100 Encounter for examination of eyes and vision without abnormal findings: Secondary | ICD-10-CM | POA: Diagnosis not present

## 2019-01-26 DIAGNOSIS — Z1159 Encounter for screening for other viral diseases: Secondary | ICD-10-CM | POA: Diagnosis not present

## 2019-01-26 DIAGNOSIS — H43811 Vitreous degeneration, right eye: Secondary | ICD-10-CM | POA: Diagnosis not present

## 2019-01-29 DIAGNOSIS — K259 Gastric ulcer, unspecified as acute or chronic, without hemorrhage or perforation: Secondary | ICD-10-CM | POA: Diagnosis not present

## 2019-01-29 DIAGNOSIS — R1013 Epigastric pain: Secondary | ICD-10-CM | POA: Diagnosis not present

## 2019-01-29 DIAGNOSIS — K2930 Chronic superficial gastritis without bleeding: Secondary | ICD-10-CM | POA: Diagnosis not present

## 2019-01-29 DIAGNOSIS — K2100 Gastro-esophageal reflux disease with esophagitis, without bleeding: Secondary | ICD-10-CM | POA: Diagnosis not present

## 2019-01-29 DIAGNOSIS — Z1211 Encounter for screening for malignant neoplasm of colon: Secondary | ICD-10-CM | POA: Diagnosis not present

## 2019-01-29 DIAGNOSIS — K228 Other specified diseases of esophagus: Secondary | ICD-10-CM | POA: Diagnosis not present

## 2019-01-29 DIAGNOSIS — K5730 Diverticulosis of large intestine without perforation or abscess without bleeding: Secondary | ICD-10-CM | POA: Diagnosis not present

## 2019-01-29 DIAGNOSIS — K449 Diaphragmatic hernia without obstruction or gangrene: Secondary | ICD-10-CM | POA: Diagnosis not present

## 2019-02-03 DIAGNOSIS — K2930 Chronic superficial gastritis without bleeding: Secondary | ICD-10-CM | POA: Diagnosis not present

## 2019-02-03 DIAGNOSIS — K228 Other specified diseases of esophagus: Secondary | ICD-10-CM | POA: Diagnosis not present

## 2019-02-03 DIAGNOSIS — J340 Abscess, furuncle and carbuncle of nose: Secondary | ICD-10-CM | POA: Diagnosis not present

## 2019-03-18 ENCOUNTER — Ambulatory Visit: Payer: Medicare HMO | Attending: Internal Medicine

## 2019-03-18 ENCOUNTER — Other Ambulatory Visit: Payer: Self-pay

## 2019-03-18 DIAGNOSIS — Z20828 Contact with and (suspected) exposure to other viral communicable diseases: Secondary | ICD-10-CM | POA: Diagnosis not present

## 2019-03-18 DIAGNOSIS — Z20822 Contact with and (suspected) exposure to covid-19: Secondary | ICD-10-CM

## 2019-03-20 LAB — NOVEL CORONAVIRUS, NAA: SARS-CoV-2, NAA: NOT DETECTED

## 2019-03-24 DIAGNOSIS — J019 Acute sinusitis, unspecified: Secondary | ICD-10-CM | POA: Diagnosis not present

## 2019-03-24 DIAGNOSIS — R69 Illness, unspecified: Secondary | ICD-10-CM | POA: Diagnosis not present

## 2019-04-07 DIAGNOSIS — H02839 Dermatochalasis of unspecified eye, unspecified eyelid: Secondary | ICD-10-CM | POA: Diagnosis not present

## 2019-04-07 DIAGNOSIS — Z20828 Contact with and (suspected) exposure to other viral communicable diseases: Secondary | ICD-10-CM | POA: Diagnosis not present

## 2019-04-08 ENCOUNTER — Encounter (INDEPENDENT_AMBULATORY_CARE_PROVIDER_SITE_OTHER): Payer: Self-pay | Admitting: Family Medicine

## 2019-04-08 ENCOUNTER — Other Ambulatory Visit: Payer: Self-pay

## 2019-04-08 ENCOUNTER — Ambulatory Visit (INDEPENDENT_AMBULATORY_CARE_PROVIDER_SITE_OTHER): Payer: Medicare HMO | Admitting: Family Medicine

## 2019-04-08 VITALS — BP 115/71 | HR 81 | Temp 97.7°F | Ht 61.0 in | Wt 216.0 lb

## 2019-04-08 DIAGNOSIS — R5383 Other fatigue: Secondary | ICD-10-CM

## 2019-04-08 DIAGNOSIS — E559 Vitamin D deficiency, unspecified: Secondary | ICD-10-CM

## 2019-04-08 DIAGNOSIS — R739 Hyperglycemia, unspecified: Secondary | ICD-10-CM

## 2019-04-08 DIAGNOSIS — E038 Other specified hypothyroidism: Secondary | ICD-10-CM | POA: Diagnosis not present

## 2019-04-08 DIAGNOSIS — Z0289 Encounter for other administrative examinations: Secondary | ICD-10-CM

## 2019-04-08 DIAGNOSIS — R0602 Shortness of breath: Secondary | ICD-10-CM

## 2019-04-08 DIAGNOSIS — F418 Other specified anxiety disorders: Secondary | ICD-10-CM | POA: Diagnosis not present

## 2019-04-08 DIAGNOSIS — Z6841 Body Mass Index (BMI) 40.0 and over, adult: Secondary | ICD-10-CM

## 2019-04-08 DIAGNOSIS — E7849 Other hyperlipidemia: Secondary | ICD-10-CM

## 2019-04-08 DIAGNOSIS — R69 Illness, unspecified: Secondary | ICD-10-CM | POA: Diagnosis not present

## 2019-04-08 NOTE — Progress Notes (Signed)
Office: [phone]  /  Fax: [phone]    Date: April 22, 2019  Time Seen: 11:05am Duration: 44 minutes Provider: Glennie Isle, PsyD Type of Session: Intake for Individual Therapy  Type of Contact: Face-to-face  Informed Consent for In-Person Services During COVID-19: During today's appointment, information about the decision to initiate in-person services in light of the WFUXN-23 public health crisis was discussed. Claryssa and this provider agreed to meet in person for some or all future appointments. If there is a resurgence of the pandemic or other health concerns arise, telepsychological services may be initiated and any related concerns will be discussed and an attempt to address them will be made. Ermie verbally acknowledged understanding that if necessary, this provider may determine there is a need to initiate telepsychological services for everyone's well-being. Denell expressed understanding she may request to initiate telepsychological services, and that request will be respected as long as it is feasible and clinically appropriate.  The risks for opting for in-person services was discussed. Dulce verbally acknowledged understanding that by coming to the office, she is assuming the risk of exposure to the coronavirus or other public risk, and the risk may increase if Lawonda travels by public transportation, cab, or [employer]. To obtain in-person services, Maeryn verbally agreed to taking certain precautions (e.g., screening prior to appointment; universal masking; social distancing of 6 feet; proper hand hygiene) set forth by [facility] to keep everyone safe from exposure and subsequent consequences. This information was shared by front desk staff either at the time of scheduling and/or during the check-in process. Lynnell expressed understanding that should she not adhere to these safeguards, it may result in starting/returning to a telepsychological service  arrangement and/or the exploration of other options for treatment. Maxwell acknowledged understanding that [facility] will follow the protocol set forth by [facility] should a patient present with a fever or other symptoms or disclose recent exposure, which will include rescheduling the appointment. Furthermore, Khamani acknowledged understanding that precautions may change if additional local, state or federal orders or guidelines are published.This provider also shared that if [name] tests positive for the coronavirus and was in the [facility] clinic, this provider will follow [facility]'s disclosure policy. Similarly, this provider will follow [facility]'s disclosure policy should this provider or staff test positive for the coronavirus. To avoid handling of paper/writing instruments and increasing likelihood of touching, verbal consent was obtained by Caren Griffins during today's appointment prior to proceeding. Billy provided verbal consent to proceed, and acknowledged understanding that by verbally consenting to proceed, she is agreeable to all information noted above.   Informed Consent: The provider's role was explained to [employer]. The provider reviewed and discussed issues of confidentiality, privacy, and limits therein (e.g., reporting obligations). In addition to verbal informed consent, written informed consent for psychological services was obtained prior to the initial appointment. Since the clinic is not a 24/7 crisis center, mental health emergency resources were shared and this  provider explained MyChart, e-mail, voicemail, and/or other messaging systems should be utilized only for non-emergency reasons. This provider also explained that information obtained during appointments will be placed in Mychele's medical record and relevant information will be shared with other providers at [facility] for coordination of care. Moreover, [name] agreed  information may be shared with other [facility] providers as needed for coordination of care. By signing the service agreement document, [name] provided written consent for coordination of care. Caren Griffins  also verbally acknowledged understanding she is ultimately responsible for understanding her insurance benefits for services. [name]  acknowledged understanding that appointments cannot be recorded without both party consent. [name] verbally consented to proceed.  Chief Complaint/HPI: Melissa Leach was referred by Dr. Dennard Nip due to depression with anxiety. Per the note for the initial visit with Dr. Dennard Nip on April 08, 2019, "Melissa Leach notes some emotional eating and struggles with feelings of guilt about her weight. She is on Zoloft and buspirone." During the initial appointment, Melissa Leach reported experiencing the following: significant food cravings issues , snacking frequently in the evenings, frequently drinking liquids with calories, frequently making poor food choices, frequently eating larger portions than normal , struggling with emotional eating and skipping meals frequently. Melissa Leach's Food and Mood (modified PHQ-9) score on April 08, 2019 was 22.  During today's appointment, Melissa Leach was verbally administered a questionnaire assessing various behaviors related to emotional eating. Melissa Leach endorsed the following: overeat when you are celebrating, experience food cravings on a regular basis, eat certain foods when you are anxious, stressed, depressed, or your feelings are hurt, use food to help you cope with emotional situations, find food is comforting to you, overeat when you are worried about something, overeat frequently when you are bored or lonely, not worry about what you eat when you are in a good mood, overeat when you are alone, but eat much less when you are with other people, eat to help you stay awake and eat as a reward. She shared she craves popcorn or "anything  salty." Melissa Leach believes the onset of emotional eating was likely in adulthood. Prior to stating with the clinic, she described the frequency of emotional eating as "every day." She acknowledged she is "overeating" her snack calories currently by approximately 100 calories. In addition, Terril denied a history of binge eating; however, indicated eating larger portions at times. Versie denied a history of restricting food intake, purging and engagement in other compensatory strategies, and has never been diagnosed with an eating disorder. She also denied a history of treatment for emotional eating.  Moreover, Lateya indicated boredom triggers emotional eating, whereas engaging in self-talk makes emotional eating better. Furthermore, Nivea denied other problems of concern.    Mental Status Examination:  Appearance: well groomed and appropriate hygiene  Behavior: appropriate to circumstances Mood: euthymic Affect: mood congruent Speech: normal in rate, volume, and tone Eye Contact: appropriate Psychomotor Activity: appropriate Gait: normal Thought Process: linear, logical, and goal directed  Thought Content/Perception: denies suicidal and homicidal ideation, plan, and intent and no hallucinations, delusions, bizarre thinking or behavior reported or observed Orientation: time, person, place and purpose of appointment Memory/Concentration: memory, attention, language, and fund of knowledge intact  Insight/Judgment: good  Family & Psychosocial History: Jacqualine reported she is a widow and has two adult daughters. 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 Additionally, Klair shared her highest level of education obtained is a master's degree. Currently, Estle's social support system consists of her women's group, two very good friends, and daughters. Moreover, Mozelle stated she resides with her puppy.   Medical History:  Past Medical History:    Diagnosis Date  . Anemia   . Anxiety   . Arthritis    SEVERE PAIN RIGHT HIP  . Blood in urine    "ALWAYS"  --AND STATES ALWAYS TREATED FOR UTI--BUT HAS NEVER HAD UTI.  . Cellulitis   . Chronic back pain   . Complication of anesthesia    "  SCRATCHED CORNEA"  WAKING UP FROM  KNEE SURGERY--'82  . Depression    PT'S HUSBAND DIED OF CANCER AT [facility] 2012/02/10  . Gastritis    HISTORY OF GASTRITIS--OCCAS ABDOMINAL PAIN   . GERD (gastroesophageal reflux disease)    takes Omeprazole daily and states only bc she takes Mobic  . H/O hiatal hernia   . Headache(784.0)    MIGRAINE- INFREQUENT  . History of migraine    takes Topamax daily and Imitrex prn  . History of shingles   . Hyperlipidemia    takes Lipitor daily  . Hypothyroidism 05/13/12  . Insomnia    takes Xanax and Ativan prn  . Joint pain   . Joint swelling   . Lichenoid dermatitis 03/16/2007   perianal biopsy  . Nocturia   . Osteoarthritis   . Recurrent sinus infections   . Stomach ulcer   . Vitamin D deficiency    Past Surgical History:  Procedure Laterality Date  . COLONOSCOPY    . epidural injections     x 2 in the past 4weeks  . ESOPHAGOGASTRODUODENOSCOPY    . EYE SURGERY  1994   RA surgery-  . HEMATOMA EVACUATION  12/09/2011   Procedure: EVACUATION HEMATOMA;  Surgeon: Mauri Pole, MD;  Location: [facility];  Service: Orthopedics;  Laterality: Right;  Evacuation of Hematoma Right Knee, I & D and Closed Manipulation of Right Knee  . IRRIGATION AND DEBRIDEMENT KNEE  12/09/2011   Procedure: IRRIGATION AND DEBRIDEMENT KNEE;  Surgeon: Mauri Pole, MD;  Location: [facility];  Service: Orthopedics;  Laterality: Right;  . JOINT REPLACEMENT     RIGHT TOTAL KNEE REPLACEMENT AND RT TOTAL KNEE REVISION    AND  LEFT TOTAL KNEE REPLACEMENT  . KNEE CLOSED REDUCTION  12/09/2011   Procedure: CLOSED MANIPULATION KNEE;  Surgeon: Mauri Pole, MD;  Location: [facility];  Service: Orthopedics;  Laterality: Right;  . LUMBAR SPINE SURGERY  05/15/12    3 disc fusion  . TONSILLECTOMY    . TOTAL HIP ARTHROPLASTY  06/25/2011   Procedure: TOTAL HIP ARTHROPLASTY ANTERIOR APPROACH;  Surgeon: Mauri Pole, MD;  Location: [facility];  Service: Orthopedics;  Laterality: Left;  . TOTAL HIP ARTHROPLASTY  03/02/2012   Procedure: TOTAL HIP ARTHROPLASTY ANTERIOR APPROACH;  Surgeon: Mauri Pole, MD;  Location: [facility];  Service: Orthopedics;  Laterality: Right;  . TOTAL KNEE REVISION  10/28/2011   Procedure: TOTAL KNEE REVISION;  Surgeon: Mauri Pole, MD;  Location: [facility];  Service: Orthopedics;  Laterality: Right;  . TOTAL SHOULDER ARTHROPLASTY Right 01/03/2017   Procedure: RIGHT SHOULDER ANATOMIC SHOULDER REPLACEMENT AND ROTATOR CUFF REPAIR;  Surgeon: Netta Cedars, MD;  Location: [facility];  Service: Orthopedics;  Laterality: Right;  . TUBAL LIGATION    . WOUND EXPLORATION Bilateral 07/06/2012   Procedure: WOUND EXPLORATION lumbar;  Surgeon: Charlie Pitter, MD;  Location: [facility];  Service: Neurosurgery;  Laterality: Bilateral;  Incision and Drainage   Current Outpatient Medications on File Prior to Visit  Medication Sig Dispense Refill  . amoxicillin (AMOXIL) 500 MG tablet Take 2,000 mg by mouth See admin instructions. Pt takes before dental procedures    . atorvastatin (LIPITOR) 40 MG tablet Take 20 mg by mouth every evening.     . busPIRone (BUSPAR) 5 MG tablet Take 5 mg by mouth 2 (two) times daily.    . Calcium Carb-Cholecalciferol (CALCIUM 600+D3 PO) Take 1 tablet by mouth 2 (two) times daily.    Marland Kitchen  cetirizine (ZYRTEC) 10 MG tablet Take 10 mg by mouth daily.    Marland Kitchen levothyroxine (SYNTHROID) 100 MCG tablet Take 100 mcg by mouth daily before breakfast.    . meloxicam (MOBIC) 15 MG tablet Take 15 mg by mouth daily.    . metronidazole (NORITATE) 1 % cream Apply topically daily as needed.    . Multiple Vitamin (MULTIVITAMIN WITH MINERALS) TABS Take 1 tablet by mouth daily.    . Multiple Vitamins-Minerals (PRESERVISION AREDS 2+MULTI VIT PO) Take 1  capsule by mouth 2 (two) times daily.    Marland Kitchen Neomycin-Polymyxin-HC (CORTISPORIN EX) Apply topically. 2.5% PRN    . pantoprazole (PROTONIX) 40 MG tablet Take 40 mg by mouth daily.    . sertraline (ZOLOFT) 50 MG tablet Take 50 mg by mouth. Take 2 tablets by mouth daily    . SUMAtriptan (IMITREX) 100 MG tablet Take 100 mg by mouth every 2 (two) hours as needed for migraine.     . zinc gluconate 50 MG tablet Take 50 mg by mouth daily.     No current facility-administered medications on file prior to visit.  Stepanie denied a history of head injuries and loss of consciousness.   Mental Health History: Mishti shared she attended therapeutic services approximately 10 years ago to address eating concerns. Loletha reported there is no history of hospitalizations for psychiatric concerns, and has never met with a psychiatrist. Ashley stated she is prescribed Buspar, Zoloft, and Wellbutrin by her PCP, noting the medications are helpful. Lezly endorsed a family history of mental health related concerns. She stated her mother was "mentally ill" and died by suicide. She believes one of her daughter's is diagnosed by Bipolar II Disorder and her daughter previously took antidepressants. Mailin reported there is no history of trauma including psychological, physical  and sexual abuse, as well as neglect. However, she noted, "Growing up with my mother was not good." Kandy indicated her mother would reportedly tell her she could do something, but then state she never said that. Duyen further shared her mother was in bed often and attempted suicide on two separate occassions. Moreover, Jimya stated her father "never talked" to the family during her childhood; however, currently, Sheleen indicated her relationship with her father is "wonderful."  Nivea described her typical mood lately as "generally good." Aside from concerns noted above and endorsed on the PHQ-9 and GAD-7, Keller reported experiencing worry  thoughts since the onset of the pandemic and current events; decreased self-esteem due to weight; and decreased motivation. Faustina endorsed current alcohol use, noting she will consume two standard drinks a few times a week. She denied current tobacco use and illicit/recreational substance use. Regarding caffeine intake, Shelitha reported consuming "at least 32 ounces" of tea daily. She shared she previously consumed diet Coke or Pepsi throughout the day, but decreased used since starting with the clinic. Furthermore, Shaterrica indicated she is not experiencing the following: hopelessness, memory concerns, hallucinations and delusions, paranoia, symptoms of mania (e.g., expansive mood, flighty ideas, decreased need for sleep, engagement in risky behaviors), social withdrawal, crying spells and panic attacks. She also denied history of and current suicidal ideation, plan, and intent; history of and current homicidal ideation, plan, and intent; and history of and current engagement in self-harm. Notably, Mahala endorsed item 9 (i.e., "Do you feel that your weight problem is so hopeless that sometimes life doesn't seem worth living?") on the modified PHQ-9 during her initial appointment with Dr. Dennard Nip on April 08, 2019. She clarified she endorsed  the item due to hopelessness about weight and not due to suicidal ideation.    The following strengths were reported by Caren Griffins: personality, outgoing, funny, and fun. The following strengths were observed by this provider: ability to express thoughts and feelings during the therapeutic session, ability to establish and benefit from a therapeutic relationship, willingness to work toward established goal(s) with the clinic and ability to engage in reciprocal conversation.  Legal History: Magon reported there is no history of legal involvement.   Structured Assessments Results: The Patient Health Questionnaire-9 (PHQ-9) is a self-report measure that assesses  symptoms and severity of depression over the course of the last two weeks. Kennetha obtained a score of 1 suggesting minimal depression. Nichol finds the endorsed symptoms to be not difficult at all. [0= Not at all; 1= Several days; 2= More than half the days; 3= Nearly every day] Little interest or pleasure in doing things 0  Feeling down, depressed, or hopeless 0  Trouble falling or staying asleep, or sleeping too much 0  Feeling tired or having little energy 0  Poor appetite or overeating 0  Feeling bad about yourself --- or that you are a failure or have let yourself or your family down 1  Trouble concentrating on things, such as reading the newspaper or watching television 0  Moving or speaking so slowly that other people could have noticed? Or the opposite --- being so fidgety or restless that you have been moving around a lot more than usual 0  Thoughts that you would be better off dead or hurting yourself in some way 0  PHQ-9 Score 1    The Generalized Anxiety Disorder-7 (GAD-7) is a brief self-report measure that assesses symptoms of anxiety over the course of the last two weeks. Janayah obtained a score of 3 suggesting minimal anxiety. Annaliesa finds the endorsed symptoms to be not difficult at all. [0= Not at all; 1= Several days; 2= Over half the days; 3= Nearly every day] Feeling nervous, anxious, on edge 2  Not being able to stop or control worrying 0  Worrying too much about different things 0  Trouble relaxing 1  Being so restless that it's hard to sit still 0  Becoming easily annoyed or irritable 0  Feeling afraid as if something awful might happen 0  GAD-7 Score 3   Interventions:  Conducted a chart review Verbally administered PHQ-9 and GAD-7 for symptom monitoring Verbally administered Food & Mood questionnaire to assess various behaviors related to emotional eating. Provided emphatic reflections and validation Collaborated with patient on a treatment goal   Psychoeducation provided regarding physical versus emotional hunger  Provisional DSM-5 Diagnosis: 311 (F32.8) Other Specified Depressive Disorder, Emotional Eating Behaviors  Plan: Keyera appears able and willing to participate as evidenced by collaboration on a treatment goal, engagement in reciprocal conversation, and asking questions as needed for clarification. The next appointment will be scheduled in two weeks, which will be via [employer]. The following treatment goal was established: decrease emotional eating. This provider will regularly review the treatment plan and medical chart to keep informed of status changes. Krystl expressed understanding and agreement with the initial treatment plan of care. Vetta was provided with a handout to utilize between now and the next appointment to increase awareness of hunger patterns and subsequent eating.

## 2019-04-09 DIAGNOSIS — H02834 Dermatochalasis of left upper eyelid: Secondary | ICD-10-CM | POA: Diagnosis not present

## 2019-04-09 DIAGNOSIS — H02831 Dermatochalasis of right upper eyelid: Secondary | ICD-10-CM | POA: Diagnosis not present

## 2019-04-09 DIAGNOSIS — H02839 Dermatochalasis of unspecified eye, unspecified eyelid: Secondary | ICD-10-CM | POA: Diagnosis not present

## 2019-04-09 LAB — CBC WITH DIFFERENTIAL/PLATELET
Basophils Absolute: 0.1 10*3/uL (ref 0.0–0.2)
Basos: 1 %
EOS (ABSOLUTE): 0.4 10*3/uL (ref 0.0–0.4)
Eos: 5 %
Hematocrit: 37.6 % (ref 34.0–46.6)
Hemoglobin: 12.5 g/dL (ref 11.1–15.9)
Immature Grans (Abs): 0 10*3/uL (ref 0.0–0.1)
Immature Granulocytes: 0 %
Lymphocytes Absolute: 1.4 10*3/uL (ref 0.7–3.1)
Lymphs: 19 %
MCH: 30.4 pg (ref 26.6–33.0)
MCHC: 33.2 g/dL (ref 31.5–35.7)
MCV: 92 fL (ref 79–97)
Monocytes Absolute: 0.6 10*3/uL (ref 0.1–0.9)
Monocytes: 8 %
Neutrophils Absolute: 5 10*3/uL (ref 1.4–7.0)
Neutrophils: 67 %
Platelets: 309 10*3/uL (ref 150–450)
RBC: 4.11 x10E6/uL (ref 3.77–5.28)
RDW: 13.2 % (ref 11.7–15.4)
WBC: 7.5 10*3/uL (ref 3.4–10.8)

## 2019-04-09 LAB — COMPREHENSIVE METABOLIC PANEL
ALT: 36 IU/L — ABNORMAL HIGH (ref 0–32)
AST: 30 IU/L (ref 0–40)
Albumin/Globulin Ratio: 1.7 (ref 1.2–2.2)
Albumin: 4.6 g/dL (ref 3.8–4.8)
Alkaline Phosphatase: 157 IU/L — ABNORMAL HIGH (ref 39–117)
BUN/Creatinine Ratio: 26 (ref 12–28)
BUN: 22 mg/dL (ref 8–27)
Bilirubin Total: 0.4 mg/dL (ref 0.0–1.2)
CO2: 24 mmol/L (ref 20–29)
Calcium: 9.7 mg/dL (ref 8.7–10.3)
Chloride: 100 mmol/L (ref 96–106)
Creatinine, Ser: 0.85 mg/dL (ref 0.57–1.00)
GFR calc Af Amer: 82 mL/min/{1.73_m2} (ref >59)
GFR calc non Af Amer: 71 mL/min/{1.73_m2} (ref >59)
Globulin, Total: 2.7 g/dL (ref 1.5–4.5)
Glucose: 88 mg/dL (ref 65–99)
Potassium: 4.3 mmol/L (ref 3.5–5.2)
Sodium: 138 mmol/L (ref 134–144)
Total Protein: 7.3 g/dL (ref 6.0–8.5)

## 2019-04-09 LAB — FOLATE: Folate: >20 ng/mL (ref >3.0)

## 2019-04-09 LAB — LIPID PANEL WITH LDL/HDL RATIO
Cholesterol, Total: 213 mg/dL — ABNORMAL HIGH (ref 100–199)
HDL: 52 mg/dL (ref >39)
LDL Chol Calc (NIH): 120 mg/dL — ABNORMAL HIGH (ref 0–99)
LDL/HDL Ratio: 2.3 ratio (ref 0.0–3.2)
Triglycerides: 233 mg/dL — ABNORMAL HIGH (ref 0–149)
VLDL Cholesterol Cal: 41 mg/dL — ABNORMAL HIGH (ref 5–40)

## 2019-04-09 LAB — VITAMIN B12: Vitamin B-12: 990 pg/mL (ref 232–1245)

## 2019-04-09 LAB — INSULIN, RANDOM: INSULIN: 12.1 u[IU]/mL (ref 2.6–24.9)

## 2019-04-09 LAB — T4, FREE: Free T4: 1.4 ng/dL (ref 0.82–1.77)

## 2019-04-09 LAB — TSH: TSH: 0.702 u[IU]/mL (ref 0.450–4.500)

## 2019-04-09 LAB — T3: T3, Total: 117 ng/dL (ref 71–180)

## 2019-04-09 LAB — VITAMIN D 25 HYDROXY (VIT D DEFICIENCY, FRACTURES): Vit D, 25-Hydroxy: 31.2 ng/mL (ref 30.0–100.0)

## 2019-04-09 LAB — HEMOGLOBIN A1C
Est. average glucose Bld gHb Est-mCnc: 114 mg/dL
Hgb A1c MFr Bld: 5.6 % (ref 4.8–5.6)

## 2019-04-14 DIAGNOSIS — L905 Scar conditions and fibrosis of skin: Secondary | ICD-10-CM | POA: Diagnosis not present

## 2019-04-14 DIAGNOSIS — K130 Diseases of lips: Secondary | ICD-10-CM | POA: Diagnosis not present

## 2019-04-14 NOTE — Progress Notes (Signed)
Chief Complaint:   OBESITY Melissa Leach (MR# [id number]) is a 69 y.o. female who presents for evaluation and treatment of obesity and related comorbidities. Current BMI is Body mass index is 40.81 kg/m. Melissa Leach has been struggling with her weight for many years and has been unsuccessful in either losing weight, maintaining weight loss, or reaching her healthy weight goal.  Melissa Leach states she is currently in the action stage of change and ready to dedicate time achieving and maintaining a healthier weight. Melissa Leach is interested in becoming our Melissa Leach and working on intensive lifestyle modifications including (but not limited to) diet and exercise for weight loss.  Melissa Leach's habits were reviewed today and are as follows: her desired weight loss is 66 lbs, she has been heavy most of her life, she started gaining weight after having children, her heaviest weight ever was 216 pounds, she has significant food cravings issues, she snacks frequently in the evenings, she skips meals frequently, she is frequently drinking liquids with calories, she frequently makes poor food choices, she frequently eats larger portions than normal and she struggles with emotional eating.  Depression Screen Melissa Leach (modified PHQ-9) score was 22.  Depression screen PHQ 2/9 04/08/2019  Decreased Interest 3  Down, Depressed, Hopeless 2  PHQ - 2 Score 5  Altered sleeping 3  Tired, decreased energy 3  Change in appetite 3  Feeling bad or failure about yourself  3  Trouble concentrating 2  Moving slowly or fidgety/restless 2  Suicidal thoughts 1  PHQ-9 Score 22  Difficult doing work/chores Somewhat difficult   Subjective:   1. Other fatigue Melissa Leach feels her energy is lower than it should be. This has worsened with weight gain and has not worsened recently. Melissa Leach admits to daytime somnolence and admits to waking up still tired. Melissa Leach is at risk for obstructive sleep apnea. Patent has a  history of symptoms of daytime fatigue and morning headache. Melissa Leach generally gets 6 hours of sleep per night, and states they generally have nightime awakenings. Snoring is present. Apneic episodes are not present. Epworth Sleepiness Score is 12.  2. Shortness of breath on exertion Melissa Leach notes increasing shortness of breath with exercising and seems to be worsening over time with weight gain. She notes getting out of breath sooner with activity than she used to. This has not gotten worse recently. Melissa Leach denies shortness of breath at rest or orthopnea.  3. Other hyperlipidemia Melissa Leach is on Lipitor and would like to improve her cholesterol with diet. She denies chest pain or myalgias.  4. Vitamin D deficiency Melissa Leach is on OTC Vit D plus Ca+. She has no recent labs.  5. Other specified hypothyroidism Melissa Leach is on Synthroid and notes fatigue. She has had labs checked at her primary care physician office at [facility].  6. Hyperglycemia Melissa Leach has a history of very elevated glucose of 403 in epic in 2018, but she has no knowledge of this. She denies a history of diabetes mellitus.  7. Depression with anxiety Melissa Leach notes some emotional eating and struggles with feelings of guilt about her weight. She is on Zoloft and buspirone.  Assessment/Plan:   1. Other fatigue Melissa Leach was informed fatigue may be related to obesity, depression or many other causes. Labs will be ordered today, and in the meanwhile Melissa Leach has agreed to work on diet, exercise and weight loss.  - EKG 12-Lead - B12 - CBC w/Diff/Platelet - Folate  2. Shortness of breath on exertion Melissa Leach's  shortness of breath appears to be obesity related and exercise induced. She has agreed to work on weight loss and gradually increase exercise to treat her exercise induced shortness of breath. We will continue to monitor closely.  3. Other hyperlipidemia Cardiovascular risk and specific lipid/LDL goals reviewed. We discussed  several lifestyle modifications today. Melissa Leach will start her diet, and will work on exercise and weight loss efforts. We will check labs today. Orders and follow up as documented in Melissa Leach record.   Counseling Intensive lifestyle modifications are the first line treatment for this issue. . Dietary changes: Increase soluble fiber. Decrease simple carbohydrates. . Exercise changes: Moderate to vigorous-intensity aerobic activity 150 minutes per week if tolerated. . Lipid-lowering medications: see documented in medical record.  - Lipid Panel With LDL/HDL Ratio  4. Vitamin D deficiency Low Vitamin D level contributes to fatigue and are associated with obesity, breast, and colon cancer. We will check labs today, and she will follow-up for routine testing of vitamin D, at least 2-3 times per year to avoid over-replacement.  - Vitamin D (25 hydroxy)  5. Other specified hypothyroidism Melissa Leach with long-standing hypothyroidism, on levothyroxine therapy. She appears euthyroid. We will check labs today. Orders and follow up as documented in Melissa Leach record.  Counseling . Good thyroid control is important for overall health. Supratherapeutic thyroid levels are dangerous and will not improve weight loss results. . The correct way to take levothyroxine is fasting, with water, separated by at least 30 minutes from breakfast, and separated by more than 4 hours from calcium, iron, multivitamins, acid reflux medications (PPIs).   - T3 - T4, free - TSH  6. Hyperglycemia Fasting labs will be obtained today and results with be discussed with Melissa Leach in 2 weeks at her follow up visit. In the meanwhile Melissa Leach was started on a lower simple carbohydrate diet and will work on weight loss efforts.  - Comprehensive Metabolic Panel (CMET) - HgB A1c - Insulin, random  7. Depression with anxiety Behavior modification techniques were discussed today to help Melissa Leach deal with her emotional/non-hunger eating  behaviors.  Orders and follow up as documented in Melissa Leach record. We will refer to Dr. Dewaine Leach, our Bariatric Psychologist for evaluation.  8. Class 3 severe obesity with serious comorbidity and body mass index (BMI) of 40.0 to 44.9 in adult, unspecified obesity type (HCC) Melissa Leach is currently in the action stage of change and her goal is to continue with weight loss efforts. I recommend Melissa Leach begin the structured treatment plan as follows:  She has agreed to on the Category 2 Plan.  We discussed the following exercise goals today: Older adults should follow the adult guidelines. When older adults cannot meet the adult guidelines, they should be as physically active as their abilities and conditions will allow.  Older adults should do exercises that maintain or improve balance if they are at risk of falling.  We discussed the following behavioral modification strategies today: increasing lean protein intake and meal planning and cooking strategies.  She was informed of the importance of frequent follow-up visits to maximize her success with intensive lifestyle modifications for her multiple health conditions. She was informed we would discuss her lab results at her next visit unless there is a critical issue that needs to be addressed sooner. Melissa Leach agreed to keep her next visit at the agreed upon time to discuss these results.  Objective:   Blood pressure 115/71, pulse 81, temperature 97.7 F (36.5 C), temperature source Oral, height 5\' 1"  (1.549  m), weight 216 lb (98 kg), last menstrual period 12/27/2002, SpO2 98 %. Body mass index is 40.81 kg/m.  EKG: Normal sinus rhythm, rate 81 BPM.  Indirect Calorimeter completed today shows a VO2 of 262 and a REE of 1827.  Her calculated basal metabolic rate is 1607 thus her basal metabolic rate is better than expected.  General: Cooperative, alert, well developed, in no acute distress. HEENT: Conjunctivae and lids unremarkable. Neck: No thyromegaly.    Cardiovascular: Regular rhythm.  Lungs: Normal work of breathing. Extremities: No edema.  Neurologic: No focal deficits.   Lab Results  Component Value Date   CREATININE 0.85 04/08/2019   BUN 22 04/08/2019   NA 138 04/08/2019   K 4.3 04/08/2019   CL 100 04/08/2019   CO2 24 04/08/2019   Lab Results  Component Value Date   ALT 36 (H) 04/08/2019   AST 30 04/08/2019   ALKPHOS 157 (H) 04/08/2019   BILITOT 0.4 04/08/2019   Lab Results  Component Value Date   HGBA1C 5.6 04/08/2019   Lab Results  Component Value Date   INSULIN 12.1 04/08/2019   Lab Results  Component Value Date   TSH 0.702 04/08/2019   Lab Results  Component Value Date   CHOL 213 (H) 04/08/2019   HDL 52 04/08/2019   LDLCALC 120 (H) 04/08/2019   TRIG 233 (H) 04/08/2019   Lab Results  Component Value Date   WBC 7.5 04/08/2019   HGB 12.5 04/08/2019   HCT 37.6 04/08/2019   MCV 92 04/08/2019   PLT 309 04/08/2019   No results found for: IRON, TIBC, FERRITIN Obesity Behavioral Intervention Visit Documentation for Insurance:   Approximately 15 minutes were spent on the discussion below.  ASK: We discussed the diagnosis of obesity with Melissa Leach today and Melissa Leach agreed to give [place] permission to discuss obesity behavioral modification therapy today.  ASSESS: Melissa Leach has the diagnosis of obesity and her BMI today is 40.83. Melissa Leach is in the action stage of change.   ADVISE: Melissa Leach was educated on the multiple health risks of obesity as well as the benefit of weight loss to improve her health. She was advised of the need for long term treatment and the importance of lifestyle modifications to improve her current health and to decrease her risk of future health problems.  AGREE: Multiple dietary modification options and treatment options were discussed and Melissa Leach agreed to follow the recommendations documented in the above note.  ARRANGE: Paquita was educated on the importance of frequent  visits to treat obesity as outlined per CMS and USPSTF guidelines and agreed to schedule her next follow up appointment today.  Attestation Statements:   Reviewed by clinician on day of visit: allergies, medications, problem list, medical history, surgical history, family history, social history, and previous encounter notes.   I, Burt Knack, am acting as transcriptionist for Quillian Quince, MD.  I have reviewed the above documentation for accuracy and completeness, and I agree with the above. - Quillian Quince, MD

## 2019-04-22 ENCOUNTER — Ambulatory Visit (INDEPENDENT_AMBULATORY_CARE_PROVIDER_SITE_OTHER): Payer: Medicare HMO | Admitting: Psychology

## 2019-04-22 ENCOUNTER — Ambulatory Visit (INDEPENDENT_AMBULATORY_CARE_PROVIDER_SITE_OTHER): Payer: Medicare HMO | Admitting: Family Medicine

## 2019-04-22 ENCOUNTER — Encounter (INDEPENDENT_AMBULATORY_CARE_PROVIDER_SITE_OTHER): Payer: Self-pay | Admitting: Family Medicine

## 2019-04-22 ENCOUNTER — Other Ambulatory Visit: Payer: Self-pay

## 2019-04-22 ENCOUNTER — Ambulatory Visit: Payer: Medicare HMO | Attending: Internal Medicine

## 2019-04-22 VITALS — BP 105/69 | HR 89 | Temp 97.7°F | Ht 61.0 in | Wt 209.0 lb

## 2019-04-22 DIAGNOSIS — R7989 Other specified abnormal findings of blood chemistry: Secondary | ICD-10-CM | POA: Diagnosis not present

## 2019-04-22 DIAGNOSIS — Z6839 Body mass index (BMI) 39.0-39.9, adult: Secondary | ICD-10-CM

## 2019-04-22 DIAGNOSIS — R7303 Prediabetes: Secondary | ICD-10-CM

## 2019-04-22 DIAGNOSIS — Z23 Encounter for immunization: Secondary | ICD-10-CM | POA: Insufficient documentation

## 2019-04-22 DIAGNOSIS — E7849 Other hyperlipidemia: Secondary | ICD-10-CM | POA: Diagnosis not present

## 2019-04-22 DIAGNOSIS — E559 Vitamin D deficiency, unspecified: Secondary | ICD-10-CM | POA: Diagnosis not present

## 2019-04-22 DIAGNOSIS — F3289 Other specified depressive episodes: Secondary | ICD-10-CM

## 2019-04-22 DIAGNOSIS — R69 Illness, unspecified: Secondary | ICD-10-CM | POA: Diagnosis not present

## 2019-04-22 MED ORDER — VITAMIN D (ERGOCALCIFEROL) 1.25 MG (50000 UNIT) PO CAPS: 50000.0000 [IU] | ORAL_CAPSULE | ORAL | 0 refills | Status: DC

## 2019-04-22 MED ORDER — METFORMIN HCL 500 MG PO TABS: 500.0000 mg | ORAL_TABLET | Freq: Every day | ORAL | 0 refills | Status: DC

## 2019-04-22 NOTE — Progress Notes (Signed)
Office: [phone]  /  Fax: [phone]    Date: May 06, 2019   Appointment Start Time: 10:30am Duration: 24 minutes Provider: Lawerance Cruel, Psy.D. Type of Session: Individual Therapy  Location of Patient: Home Location of Provider: Provider's Home Type of Contact: Telepsychological Visit via [employer]  Session Content: Prior to initiating telepsychological services, Melissa Leach completed an informed consent document, which included the development of a safety plan (i.e., an emergency contact, nearest emergency room, and emergency resources) in the event of an emergency/crisis. Melissa Leach expressed understanding of the rationale of the safety plan. Melissa Leach verbally acknowledged understanding she is ultimately responsible for understanding her insurance benefits for telepsychological and in-person services. This provider also reviewed confidentiality, as it relates to telepsychological services, as well as the rationale for telepsychological services (i.e., to reduce exposure risk to COVID-19). Melissa Leach  acknowledged understanding that appointments cannot be recorded without both party consent and she is aware she is responsible for securing confidentiality on her end of the session. Melissa Leach verbally consented to proceed.   Young is a 69 y.o. female presenting via [employer] for a follow-up appointment to address the previously established treatment goal of decreasing emotional eating. Today's appointment was a telepsychological visit due to COVID-19. Melissa Leach provided verbal consent for today's telepsychological appointment and she is aware she is responsible for securing confidentiality on her end of the session. Prior to proceeding with today's appointment, Melissa Leach's physical location at the time of this appointment was obtained as well a phone number she could be reached at in the event of technical difficulties. Melissa Leach and this provider participated in today's telepsychological service.    This provider conducted a brief check-in. Melissa Leach shared about recent events. Emotional and physical hunger was reviewed. She acknowledged experiencing emotional hunger in the afternoons, adding she uses snack calories from her structured meal plan. Psychoeducation regarding triggers for emotional eating was provided. Melissa Leach was provided a handout, and encouraged to utilize the handout between now and the next appointment to increase awareness of triggers and frequency. Melissa Leach agreed. This provider also discussed behavioral strategies for specific triggers, such as placing the utensil down when conversing to avoid mindless eating. Melissa Leach provided verbal consent during today's appointment for this provider to send a handout about triggers via e-mail. Melissa Leach was receptive to today's appointment as evidenced by openness to sharing, responsiveness to feedback, and willingness to explore triggers for emotional eating.  Mental Status Examination:  Appearance: well groomed and appropriate hygiene  Behavior: appropriate to circumstances Mood: euthymic Affect: mood congruent Speech: normal in rate, volume, and tone Eye Contact: appropriate Psychomotor Activity: appropriate Gait: unable to assess Thought Process: linear, logical, and goal directed  Thought Content/Perception: no hallucinations, delusions, bizarre thinking or behavior reported or observed and no evidence of suicidal and homicidal ideation, plan, and intent Orientation: time, person, place and purpose of appointment Memory/Concentration: memory, attention, language, and fund of knowledge intact  Insight/Judgment: good  Interventions:  Conducted a brief chart review Provided empathic reflections and validation Reviewed content from the previous session Employed supportive psychotherapy interventions to facilitate reduced distress and to improve coping skills with identified stressors Employed motivational interviewing skills to  assess patient's willingness/desire to adhere to recommended medical treatments and assignments Psychoeducation provided regarding triggers for emotional eating  DSM-5 Diagnosis: 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 Melissa Leach has demonstrated progress in her goal  as evidenced by increased awareness of hunger patterns.   Plan: Based on current progress, the next appointment will be scheduled in three weeks, which will be via [employer]. The next session will focus on working towards the established treatment goal.

## 2019-04-22 NOTE — Progress Notes (Signed)
   Covid-19 Vaccination Clinic  Name:  Melissa Leach    MRN: [id number] DOB: 04-Apr-1950  04/22/2019  Melissa Leach was observed post Covid-19 immunization for 15 minutes without incidence. She was provided with Vaccine Information Sheet and instruction to access the V-Safe system.   Melissa Leach was instructed to call 911 with any severe reactions post vaccine: Marland Kitchen Difficulty breathing  . Swelling of your face and throat  . A fast heartbeat  . A bad rash all over your body  . Dizziness and weakness    Immunizations Administered    Name Date Dose VIS Date Route   Pfizer COVID-19 Vaccine 04/22/2019  5:26 PM 0.3 mL 03/12/2019 Intramuscular   Manufacturer: [employer], [employer]   Lot: [id number]   NDC: [address]-3

## 2019-04-26 NOTE — Progress Notes (Signed)
Chief Complaint:   OBESITY Melissa Leach is here to discuss her progress with her obesity treatment plan along with follow-up of her obesity related diagnoses. [name] is on the Category 2 Plan and states she is following her eating plan approximately 95% of the time. [name] states she is doing 0 minutes 0 times per week.  Today's visit was #: 2 Starting weight: 216 lbs Starting date: 04/08/2019 Today's weight: 209 lbs Today's date: 04/22/2019 Total lbs lost to date: 7 Total lbs lost since last in-office visit: 7  Interim History: Tomasita did very well with weight loss on her Category 2 plan. Her hunger was mostly controlled, and she even did well with traveling and staying on her plan.  Subjective:   1. Pre-diabetes Melissa Leach has a new diagnosis of pre-diabetes. Her A1c is slightly elevated, but her fasting insulin is also elevated. She is working on diet and weight loss. I discussed labs with the patient today.  2. Vitamin D deficiency Melissa Leach is on OTC Vit D with calcium. Her Vit D level is still low and she notes fatigue. I discussed labs with the patient today.  3. Other hyperlipidemia Melissa Leach is working on diet and exercise. Her LDL is elevated today even on Lipitor. I discussed labs with the patient today.  4. Elevated LFTs Melissa Leach's ALT is mildly elevated. She denies a history of liver disease. She is at high risk of non alcoholic fatty liver disease. I discussed labs with the patient today.  Lab Results  Component Value Date   ALT 36 (H) 04/08/2019   AST 30 04/08/2019   ALKPHOS 157 (H) 04/08/2019   BILITOT 0.4 04/08/2019   Assessment/Plan:   1. Pre-diabetes Melissa Leach will continue to work on weight loss, diet, exercise, and decreasing simple carbohydrates to help decrease the risk of diabetes. [name] agreed to start metformin with no refills. We will continue to monitor.  - metFORMIN (GLUCOPHAGE) 500 MG tablet; Take 1 tablet (500 mg total) by mouth daily with breakfast.   Dispense: 30 tablet; Refill: 0  2. Vitamin D deficiency Low Vitamin D level contributes to fatigue and are associated with obesity, breast, and colon cancer. Melissa Leach agreed to start prescription Vit D with no refills. She will follow-up for routine testing of Vitamin D, at least 2-3 times per year to avoid over-replacement. We will continue to monitor.  - Vitamin D, Ergocalciferol, (DRISDOL) 1.25 MG (50000 UNIT) CAPS capsule; Take 1 capsule (50,000 Units total) by mouth every 7 (seven) days.  Dispense: 4 capsule; Refill: 0  3. Other hyperlipidemia Cardiovascular risk and specific lipid/LDL goals reviewed. We discussed several lifestyle modifications today and [name] will continue to work on diet, exercise and weight loss efforts. We will recheck labs in 3 months. Orders and follow up as documented in patient record.   Counseling Intensive lifestyle modifications are the first line treatment for this issue. . Dietary changes: Increase soluble fiber. Decrease simple carbohydrates. . Exercise changes: Moderate to vigorous-intensity aerobic activity 150 minutes per week if tolerated. . Lipid-lowering medications: see documented in medical record.  4. Elevated LFTs We discussed the likely diagnosis of non-alcoholic fatty liver disease today and how this condition is obesity related. Melissa Leach was educated the importance of weight loss. Melissa Leach agreed to continue with her weight loss efforts with healthier diet and exercise as an essential part of her treatment plan. We will recheck labs in 3 months.  5. Class 2 severe obesity with serious comorbidity and body mass index (BMI) of  39.0 to 39.9 in adult, unspecified obesity type [facility]) Melissa Leach is currently in the action stage of change. As such, her goal is to continue with weight loss efforts. She has agreed to the Category 2 Plan.   Handouts were given today: Simple dinner options, Insulin resistance and Pre-diabetes.  Behavioral modification  strategies: increasing lean protein intake and meal planning and cooking strategies.  [name] has agreed to follow-up with our clinic in 2 weeks. She was informed of the importance of frequent follow-up visits to maximize her success with intensive lifestyle modifications for her multiple health conditions.   Objective:   Blood pressure 105/69, pulse 89, temperature 97.7 F (36.5 C), temperature source Oral, height 5\' 1"  (1.549 m), weight 209 lb (94.8 kg), last menstrual period 12/27/2002, SpO2 97 %. Body mass index is 39.49 kg/m.  General: Cooperative, alert, well developed, in no acute distress. HEENT: Conjunctivae and lids unremarkable. Cardiovascular: Regular rhythm.  Lungs: Normal work of breathing. Neurologic: No focal deficits.   Lab Results  Component Value Date   CREATININE 0.85 04/08/2019   BUN 22 04/08/2019   NA 138 04/08/2019   K 4.3 04/08/2019   CL 100 04/08/2019   CO2 24 04/08/2019   Lab Results  Component Value Date   ALT 36 (H) 04/08/2019   AST 30 04/08/2019   ALKPHOS 157 (H) 04/08/2019   BILITOT 0.4 04/08/2019   Lab Results  Component Value Date   HGBA1C 5.6 04/08/2019   Lab Results  Component Value Date   INSULIN 12.1 04/08/2019   Lab Results  Component Value Date   TSH 0.702 04/08/2019   Lab Results  Component Value Date   CHOL 213 (H) 04/08/2019   HDL 52 04/08/2019   LDLCALC 120 (H) 04/08/2019   TRIG 233 (H) 04/08/2019   Lab Results  Component Value Date   WBC 7.5 04/08/2019   HGB 12.5 04/08/2019   HCT 37.6 04/08/2019   MCV 92 04/08/2019   PLT 309 04/08/2019   No results found for: IRON, TIBC, FERRITIN  Obesity Behavioral Intervention Documentation for Insurance:   Approximately 15 minutes were spent on the discussion below.  ASK: We discussed the diagnosis of obesity with Melissa Leach today and Melissa Leach agreed to give Melissa Leach [place] permission to discuss obesity behavioral modification therapy today.  ASSESS: [name] has the diagnosis of  obesity and her BMI today is 39.51. [name] is in the action stage of change.   ADVISE: Melissa Leach was educated on the multiple health risks of obesity as well as the benefit of weight loss to improve her health. She was advised of the need for long term treatment and the importance of lifestyle modifications to improve her current health and to decrease her risk of future health problems.  AGREE: Multiple dietary modification options and treatment options were discussed and Frieda agreed to follow the recommendations documented in the above note.  ARRANGE: Sherri was educated on the importance of frequent visits to treat obesity as outlined per CMS and USPSTF guidelines and agreed to schedule her next follow up appointment today.  Attestation Statements:   Reviewed by clinician on day of visit: allergies, medications, problem list, medical history, surgical history, family history, social history, and previous encounter notes.   I, Melissa Leach Beecham, am acting as transcriptionist for Burt Knack, MD.  I have reviewed the above documentation for accuracy and completeness, and I agree with the above. -  Quillian Quince, MD

## 2019-05-06 ENCOUNTER — Ambulatory Visit (INDEPENDENT_AMBULATORY_CARE_PROVIDER_SITE_OTHER): Payer: Medicare HMO | Admitting: Psychology

## 2019-05-06 ENCOUNTER — Other Ambulatory Visit: Payer: Self-pay

## 2019-05-06 DIAGNOSIS — R69 Illness, unspecified: Secondary | ICD-10-CM | POA: Diagnosis not present

## 2019-05-06 DIAGNOSIS — F3289 Other specified depressive episodes: Secondary | ICD-10-CM

## 2019-05-10 ENCOUNTER — Other Ambulatory Visit: Payer: Self-pay

## 2019-05-10 ENCOUNTER — Ambulatory Visit (INDEPENDENT_AMBULATORY_CARE_PROVIDER_SITE_OTHER): Payer: Medicare HMO | Admitting: Family Medicine

## 2019-05-10 ENCOUNTER — Encounter (INDEPENDENT_AMBULATORY_CARE_PROVIDER_SITE_OTHER): Payer: Self-pay | Admitting: Family Medicine

## 2019-05-10 VITALS — BP 115/77 | HR 83 | Temp 97.8°F | Ht 61.0 in | Wt 206.0 lb

## 2019-05-10 DIAGNOSIS — R7303 Prediabetes: Secondary | ICD-10-CM

## 2019-05-10 DIAGNOSIS — Z6839 Body mass index (BMI) 39.0-39.9, adult: Secondary | ICD-10-CM | POA: Diagnosis not present

## 2019-05-10 DIAGNOSIS — E559 Vitamin D deficiency, unspecified: Secondary | ICD-10-CM

## 2019-05-10 MED ORDER — VITAMIN D (ERGOCALCIFEROL) 1.25 MG (50000 UNIT) PO CAPS: 50000.0000 [IU] | ORAL_CAPSULE | ORAL | 0 refills | Status: DC

## 2019-05-10 MED ORDER — METFORMIN HCL 500 MG PO TABS: 500.0000 mg | ORAL_TABLET | Freq: Every day | ORAL | 0 refills | Status: DC

## 2019-05-11 ENCOUNTER — Encounter (INDEPENDENT_AMBULATORY_CARE_PROVIDER_SITE_OTHER): Payer: Self-pay | Admitting: Family Medicine

## 2019-05-11 DIAGNOSIS — R7303 Prediabetes: Secondary | ICD-10-CM | POA: Insufficient documentation

## 2019-05-11 DIAGNOSIS — E559 Vitamin D deficiency, unspecified: Secondary | ICD-10-CM | POA: Insufficient documentation

## 2019-05-11 NOTE — Progress Notes (Signed)
Chief Complaint:   OBESITY Melissa Leach is here to discuss her progress with her obesity treatment plan along with follow-up of her obesity related diagnoses. Melissa Leach is on the Category 2 Plan and states she is following her eating plan approximately 90% of the time. Melissa Leach states she is doing 0 minutes 0 times per week.  Today's visit was #: 3 Starting weight: 216 lbs Starting date: 04/08/2019 Today's weight: 206 lbs Today's date: 05/10/2019 Total lbs lost to date: 10 Total lbs lost since last in-office visit: 3  Interim History: Melissa Leach reports going over extra calories a bit, but otherwise she is doing well. She is bored with the food at breakfast and dinner.  Subjective:   1. Vitamin D deficiency Melissa Leach's Vit D level is not at goal. She is on prescription Vit D  2. Pre-diabetes Melissa Leach is on metformin. She had mild diarrhea when she first started metformin which has subsided. She denies polyphagia.  Assessment/Plan:   1. Vitamin D deficiency Low Vitamin D level contributes to fatigue and are associated with obesity, breast, and colon cancer. We will refill prescription Vitamin D for 1 month. Melissa Leach will follow-up for routine testing of Vitamin D, at least 2-3 times per year to avoid over-replacement.  - Vitamin D, Ergocalciferol, (DRISDOL) 1.25 MG (50000 UNIT) CAPS capsule; Take 1 capsule (50,000 Units total) by mouth every 7 (seven) days.  Dispense: 4 capsule; Refill: 0  2. Pre-diabetes Melissa Leach will continue to work on weight loss, exercise, and decreasing simple carbohydrates to help decrease the risk of diabetes. We will refill metformin for 1 month, and we will continue to monitor.  - metFORMIN (GLUCOPHAGE) 500 MG tablet; Take 1 tablet (500 mg total) by mouth daily with breakfast.  Dispense: 30 tablet; Refill: 0  3. Class 2 severe obesity with serious comorbidity and body mass index (BMI) of 39.0 to 39.9 in adult, unspecified obesity type (HCC) Melissa Leach is currently in  the action stage of change. As such, her goal is to continue with weight loss efforts. She has agreed to the Category 2 Plan with added breakfast options.   Handouts provided: Recipes and breakfast options. We discussed dinner options.  Exercise goals: No exercise has been prescribed at this time.  Behavioral modification strategies: meal planning and cooking strategies, better snacking choices and planning for success.  Melissa Leach has agreed to follow-up with our clinic in 2 weeks. She was informed of the importance of frequent follow-up visits to maximize her success with intensive lifestyle modifications for her multiple health conditions.   Objective:   Blood pressure 115/77, pulse 83, temperature 97.8 F (36.6 C), temperature source Oral, height 5\' 1"  (1.549 m), weight 206 lb (93.4 kg), last menstrual period 12/27/2002, SpO2 98 %. Body mass index is 38.92 kg/m.  General: Cooperative, alert, well developed, in no acute distress. HEENT: Conjunctivae and lids unremarkable. Cardiovascular: Regular rhythm.  Lungs: Normal work of breathing. Neurologic: No focal deficits.   Lab Results  Component Value Date   CREATININE 0.85 04/08/2019   BUN 22 04/08/2019   NA 138 04/08/2019   K 4.3 04/08/2019   CL 100 04/08/2019   CO2 24 04/08/2019   Lab Results  Component Value Date   ALT 36 (H) 04/08/2019   AST 30 04/08/2019   ALKPHOS 157 (H) 04/08/2019   BILITOT 0.4 04/08/2019   Lab Results  Component Value Date   HGBA1C 5.6 04/08/2019   Lab Results  Component Value Date   INSULIN 12.1 04/08/2019  Lab Results  Component Value Date   TSH 0.702 04/08/2019   Lab Results  Component Value Date   CHOL 213 (H) 04/08/2019   HDL 52 04/08/2019   LDLCALC 120 (H) 04/08/2019   TRIG 233 (H) 04/08/2019   Lab Results  Component Value Date   WBC 7.5 04/08/2019   HGB 12.5 04/08/2019   HCT 37.6 04/08/2019   MCV 92 04/08/2019   PLT 309 04/08/2019   No results found for: IRON, TIBC,  FERRITIN  Obesity Behavioral Intervention Documentation for Insurance:   Approximately 15 minutes were spent on the discussion below.  ASK: We discussed the diagnosis of obesity with Melissa Leach today and Melissa Leach agreed to give [place] permission to discuss obesity behavioral modification therapy today.  ASSESS: Melissa Leach has the diagnosis of obesity and her BMI today is 38.94. Melissa Leach is in the action stage of change.   ADVISE: Melissa Leach was educated on the multiple health risks of obesity as well as the benefit of weight loss to improve her health. She was advised of the need for long term treatment and the importance of lifestyle modifications to improve her current health and to decrease her risk of future health problems.  AGREE: Multiple dietary modification options and treatment options were discussed and Melissa Leach agreed to follow the recommendations documented in the above note.  ARRANGE: Melissa Leach was educated on the importance of frequent visits to treat obesity as outlined per CMS and USPSTF guidelines and agreed to schedule her next follow up appointment today.  Attestation Statements:   Reviewed by clinician on day of visit: allergies, medications, problem list, medical history, surgical history, family history, social history, and previous encounter notes.   Wilhemena Durie, am acting as Location manager for [employer], FNP-C.  I have reviewed the above documentation for accuracy and completeness, and I agree with the above. -  Georgianne Fick, FNP

## 2019-05-13 ENCOUNTER — Ambulatory Visit: Payer: Self-pay

## 2019-05-13 ENCOUNTER — Ambulatory Visit: Payer: Medicare HMO | Attending: Internal Medicine

## 2019-05-13 ENCOUNTER — Other Ambulatory Visit (INDEPENDENT_AMBULATORY_CARE_PROVIDER_SITE_OTHER): Payer: Self-pay | Admitting: Family Medicine

## 2019-05-13 DIAGNOSIS — Z23 Encounter for immunization: Secondary | ICD-10-CM | POA: Insufficient documentation

## 2019-05-13 DIAGNOSIS — E559 Vitamin D deficiency, unspecified: Secondary | ICD-10-CM

## 2019-05-13 NOTE — Progress Notes (Signed)
   Covid-19 Vaccination Clinic  Name:  Shania Bjelland    MRN: [id number] DOB: 01-Aug-1950  05/13/2019  Ms. Cerrato was observed post Covid-19 immunization for 15 minutes without incidence. She was provided with Vaccine Information Sheet and instruction to access the V-Safe system.   Ms. Branca was instructed to call 911 with any severe reactions post vaccine: Marland Kitchen Difficulty breathing  . Swelling of your face and throat  . A fast heartbeat  . A bad rash all over your body  . Dizziness and weakness    Immunizations Administered    Name Date Dose VIS Date Route   Pfizer COVID-19 Vaccine 05/13/2019  5:16 PM 0.3 mL 03/12/2019 Intramuscular   Manufacturer: [employer], [employer]   Lot: [id number]   NDC: [id number]

## 2019-05-13 NOTE — Progress Notes (Signed)
  Office: [phone]  /  Fax: [phone]    Date: May 27, 2019   Appointment Start Time: 10:37am Duration: 21 minutes Provider: Lawerance Cruel, Psy.D. Type of Session: Individual Therapy  Location of Patient: Home Location of Provider: Provider's Home Type of Contact: Telepsychological Visit via [employer]  Session Content: Melissa Leach is a 69 y.o. female presenting via [employer] for a follow-up appointment to address the previously established treatment goal of decreasing emotional eating. Today's appointment was a telepsychological visit due to COVID-19. [name] provided verbal consent for today's telepsychological appointment and she is aware she is responsible for securing confidentiality on her end of the session. Prior to proceeding with today's appointment, [name]'s physical location at the time of this appointment was obtained as well a phone number she could be reached at in the event of technical difficulties. [name] and this provider participated in today's telepsychological service.   This provider conducted a brief check-in. [name] shared about recent events. She reported she continues to lose weight, adding "I'm scared to death I'm not going to lose weight." This was explored further and normalized. Psychoeducation regarding mindfulness was provided to assist with coping. A handout was provided to [name] with further information regarding mindfulness, including exercises. This provider also explained the benefit of mindfulness as it relates to emotional eating. [name] was encouraged to engage in the provided exercises between now and the next appointment with this provider. [name] agreed. During today's appointment, [name] was led through a mindfulness exercise involving her senses. [name] provided verbal consent during today's appointment for this provider to send a handout about mindfulness via e-mail. Overall, [name] was receptive to today's appointment as evidenced by  openness to sharing, responsiveness to feedback, and willingness to engage in mindfulness exercises to assist with coping.  Mental Status Examination:  Appearance: well groomed and appropriate hygiene  Behavior: appropriate to circumstances Mood: euthymic Affect: mood congruent Speech: normal in rate, volume, and tone Eye Contact: appropriate Psychomotor Activity: appropriate Gait: unable to assess Thought Process: linear, logical, and goal directed  Thought Content/Perception: no hallucinations, delusions, bizarre thinking or behavior reported or observed and no evidence of suicidal and homicidal ideation, plan, and intent Orientation: time, person, place and purpose of appointment Memory/Concentration: memory, attention, language, and fund of knowledge intact  Insight/Judgment: good  Interventions:  Conducted a brief chart review Provided empathic reflections and validation Employed supportive psychotherapy interventions to facilitate reduced distress and to improve coping skills with identified stressors Employed motivational interviewing skills to assess patient's willingness/desire to adhere to recommended medical treatments and assignments Psychoeducation provided regarding mindfulness Engaged patient in mindfulness exercise(s) Employed acceptance and commitment interventions to emphasize mindfulness and acceptance without struggle  DSM-5 Diagnosis: 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 [name] has demonstrated progress in her goal as evidenced by increased awareness of hunger patterns and increased awareness of triggers for emotional eating. [name] also demonstrates willingness to engage in mindfulness exercises.  Plan: The next appointment will be scheduled in two weeks, which will be via  [employer]. The next session will focus on working towards the established treatment goal.

## 2019-05-15 ENCOUNTER — Other Ambulatory Visit (INDEPENDENT_AMBULATORY_CARE_PROVIDER_SITE_OTHER): Payer: Self-pay | Admitting: Family Medicine

## 2019-05-15 DIAGNOSIS — R7303 Prediabetes: Secondary | ICD-10-CM

## 2019-05-24 ENCOUNTER — Other Ambulatory Visit: Payer: Self-pay

## 2019-05-24 ENCOUNTER — Encounter (INDEPENDENT_AMBULATORY_CARE_PROVIDER_SITE_OTHER): Payer: Self-pay | Admitting: Family Medicine

## 2019-05-24 ENCOUNTER — Ambulatory Visit (INDEPENDENT_AMBULATORY_CARE_PROVIDER_SITE_OTHER): Payer: Medicare HMO | Admitting: Family Medicine

## 2019-05-24 VITALS — BP 100/64 | HR 72 | Temp 97.9°F | Ht 61.0 in | Wt 201.0 lb

## 2019-05-24 DIAGNOSIS — R7303 Prediabetes: Secondary | ICD-10-CM

## 2019-05-24 DIAGNOSIS — Z6838 Body mass index (BMI) 38.0-38.9, adult: Secondary | ICD-10-CM | POA: Diagnosis not present

## 2019-05-24 DIAGNOSIS — Z1231 Encounter for screening mammogram for malignant neoplasm of breast: Secondary | ICD-10-CM | POA: Diagnosis not present

## 2019-05-24 NOTE — Progress Notes (Signed)
Chief Complaint:   OBESITY Melissa Leach is here to discuss her progress with her obesity treatment plan along with follow-up of her obesity related diagnoses. Melissa Leach is on the Category 2 Plan and states she is following her eating plan approximately 90% of the time. Melissa Leach states she is exercising 0 minutes 0 times per week.  Today's visit was #: 4 Starting weight: 216 lbs Starting date: 04/08/2019 Today's weight: 201 lbs Today's date: 05/24/2019 Total lbs lost to date: 15 Total lbs lost since last in-office visit: 5  Interim History: Melissa Leach is happy with her weight loss at this visit. She has experimented with some new recipes. She has done very well is down 15 pounds in six weeks on our plan.  Subjective:   Prediabetes Melissa Leach has a diagnosis of prediabetes and she is on metformin. She continues to work on diet and exercise to decrease her risk of diabetes. She denies polyphagia.  Lab Results  Component Value Date   HGBA1C 5.6 04/08/2019   Lab Results  Component Value Date   INSULIN 12.1 04/08/2019   Assessment/Plan:   Prediabetes Melissa Leach will continue metformin and she will continue to work on weight loss, exercise, and decreasing simple carbohydrates to help decrease the risk of diabetes.   Class 2 severe obesity with serious comorbidity and body mass index (BMI) of 38.0 to 38.9 in adult, unspecified obesity type (HCC) Melissa Leach is currently in the action stage of change. As such, her goal is to continue with weight loss efforts. She has agreed to the Category 2 Plan +100 calories.   Exercise goals: No exercise has been prescribed at this time.  Behavioral modification strategies: better snacking choices and planning for success.  Melissa Leach has agreed to follow-up with our clinic in 2 weeks. She was informed of the importance of frequent follow-up visits to maximize her success with intensive lifestyle modifications for her multiple health conditions.   Objective:    Blood pressure 100/64, pulse 72, temperature 97.9 F (36.6 C), temperature source Oral, height 5\' 1"  (1.549 m), weight 201 lb (91.2 kg), last menstrual period 12/27/2002, SpO2 98 %. Body mass index is 37.98 kg/m.  General: Cooperative, alert, well developed, in no acute distress. HEENT: Conjunctivae and lids unremarkable. Cardiovascular: Regular rhythm.  Lungs: Normal work of breathing. Neurologic: No focal deficits.   Lab Results  Component Value Date   CREATININE 0.85 04/08/2019   BUN 22 04/08/2019   NA 138 04/08/2019   K 4.3 04/08/2019   CL 100 04/08/2019   CO2 24 04/08/2019   Lab Results  Component Value Date   ALT 36 (H) 04/08/2019   AST 30 04/08/2019   ALKPHOS 157 (H) 04/08/2019   BILITOT 0.4 04/08/2019   Lab Results  Component Value Date   HGBA1C 5.6 04/08/2019   Lab Results  Component Value Date   INSULIN 12.1 04/08/2019   Lab Results  Component Value Date   TSH 0.702 04/08/2019   Lab Results  Component Value Date   CHOL 213 (H) 04/08/2019   HDL 52 04/08/2019   LDLCALC 120 (H) 04/08/2019   TRIG 233 (H) 04/08/2019   Lab Results  Component Value Date   WBC 7.5 04/08/2019   HGB 12.5 04/08/2019   HCT 37.6 04/08/2019   MCV 92 04/08/2019   PLT 309 04/08/2019   No results found for: IRON, TIBC, FERRITIN   Ref. Range 04/08/2019 12:30  Vitamin D, 25-Hydroxy Latest Ref Range: 30.0 - 100.0 ng/mL 31.2  Obesity Behavioral Intervention Documentation for Insurance:   Approximately 15 minutes were spent on the discussion below.  ASK: We discussed the diagnosis of obesity with Melissa Leach today and Melissa Leach agreed to give [place] permission to discuss obesity behavioral modification therapy today.  ASSESS: Melissa Leach has the diagnosis of obesity and her BMI today is 38. Melissa Leach is in the action stage of change.   Melissa Leach: Melissa Leach was educated on the multiple health risks of obesity as well as the benefit of weight loss to improve her health. She was advised of the  need for long term treatment and the importance of lifestyle modifications to improve her current health and to decrease her risk of future health problems.  Melissa Leach: Multiple dietary modification options and treatment options were discussed and Melissa Leach agreed to follow the recommendations documented in the above note.  ARRANGE: Melissa Leach was educated on the importance of frequent visits to treat obesity as outlined per CMS and USPSTF guidelines and agreed to schedule her next follow up appointment today.  Attestation Statements:   Reviewed by clinician on day of visit: allergies, medications, problem list, medical history, surgical history, family history, social history, and previous encounter notes.  Cristi Loron, am acting as Energy manager for [place], FNP-C.  I have reviewed the above documentation for accuracy and completeness, and I Melissa Leach with the above. -  [name] Aristizabal [employer], FNP-C

## 2019-05-25 DIAGNOSIS — L309 Dermatitis, unspecified: Secondary | ICD-10-CM | POA: Diagnosis not present

## 2019-05-26 ENCOUNTER — Encounter (INDEPENDENT_AMBULATORY_CARE_PROVIDER_SITE_OTHER): Payer: Self-pay | Admitting: Family Medicine

## 2019-05-27 ENCOUNTER — Other Ambulatory Visit: Payer: Self-pay

## 2019-05-27 ENCOUNTER — Ambulatory Visit (INDEPENDENT_AMBULATORY_CARE_PROVIDER_SITE_OTHER): Payer: Medicare HMO | Admitting: Psychology

## 2019-05-27 DIAGNOSIS — F3289 Other specified depressive episodes: Secondary | ICD-10-CM

## 2019-05-27 DIAGNOSIS — R69 Illness, unspecified: Secondary | ICD-10-CM | POA: Diagnosis not present

## 2019-05-27 NOTE — Progress Notes (Signed)
  Office: [phone]  /  Fax: [phone]    Date: June 10, 2019   Appointment Start Time: 1:59pm Duration: 27 minutes Provider: Lawerance Cruel, Psy.D. Type of Session: Individual Therapy  Location of Patient: Home Location of Provider: Provider's Home Type of Contact: Telepsychological Visit via [employer]  Session Content: Melissa Leach is a 69 y.o. female presenting via [employer] for a follow-up appointment to address the previously established treatment goal of decreasing emotional eating. Today's appointment was a telepsychological visit due to COVID-19. Melissa Leach provided verbal consent for today's telepsychological appointment and she is aware she is responsible for securing confidentiality on her end of the session. Prior to proceeding with today's appointment, Melissa Leach's physical location at the time of this appointment was obtained as well a phone number she could be reached at in the event of technical difficulties. Melissa Leach and this provider participated in today's telepsychological service.   This provider conducted a brief check-in. Melissa Leach shared about recent events. She reported a reduction in emotional eating, noting she will eat something on her plan if she does experience emotional hunger. Session focused further on mindfulness. She acknowledged she reviewed the handout, but stated she has not engaged in any exercises. This provider discussed the utilization of YouTube for mindfulness exercises (e.g., exercises by Rhae Hammock). Melissa Leach was led through a mindfulness exercise (A Taste of Mindfulness). Her experience was processed. Melissa Leach provided verbal consent during today's appointment for this provider to send the handout for today's exercise via e-mail. Overall, Melissa Leach was receptive to today's appointment as evidenced by openness to sharing, responsiveness to feedback, and willingness to continue engaging in learned skills.  Mental Status Examination:  Appearance: well groomed  and appropriate hygiene  Behavior: appropriate to circumstances Mood: euthymic Affect: mood congruent Speech: normal in rate, volume, and tone Eye Contact: appropriate Psychomotor Activity: appropriate Gait: unable to assess Thought Process: linear, logical, and goal directed  Thought Content/Perception: no hallucinations, delusions, bizarre thinking or behavior reported or observed and no evidence of suicidal and homicidal ideation, plan, and intent Orientation: time, person, place and purpose of appointment Memory/Concentration: memory, attention, language, and fund of knowledge intact  Insight/Judgment: good  Interventions:  Conducted a brief chart review Provided empathic reflections and validation Reviewed content from the previous session Employed supportive psychotherapy interventions to facilitate reduced distress and to improve coping skills with identified stressors Employed motivational interviewing skills to assess patient's willingness/desire to adhere to recommended medical treatments and assignments Engaged patient in mindfulness exercise(s) Employed acceptance and commitment interventions to emphasize mindfulness and acceptance without struggle  DSM-5 Diagnosis(es): 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 Melissa Leach demonstrated progress in her goal as evidenced by increased awareness of hunger patterns, increased awareness of triggers for emotional eating and reduction in emotional eating. Melissa Leach also continues to demonstrate willingness to engage in learned skill(s).  Plan: Based on recent progress, Melissa Leach declined future appointments with this provider. She acknowledged understanding that she may request a follow-up appointment with this provider in the future as long as she is still  established with the clinic. No further follow-up planned by this provider.

## 2019-05-31 ENCOUNTER — Other Ambulatory Visit: Payer: Self-pay

## 2019-06-01 ENCOUNTER — Other Ambulatory Visit: Payer: Self-pay

## 2019-06-01 ENCOUNTER — Ambulatory Visit (INDEPENDENT_AMBULATORY_CARE_PROVIDER_SITE_OTHER): Payer: Medicare HMO | Admitting: Certified Nurse Midwife

## 2019-06-01 ENCOUNTER — Encounter: Payer: Self-pay | Admitting: Certified Nurse Midwife

## 2019-06-01 ENCOUNTER — Other Ambulatory Visit (HOSPITAL_COMMUNITY)
Admission: RE | Admit: 2019-06-01 | Discharge: 2019-06-01 | Disposition: A | Discharge status: Home / Self Care | Payer: Medicare HMO | Source: Ambulatory Visit | Attending: Certified Nurse Midwife | Admitting: Certified Nurse Midwife

## 2019-06-01 VITALS — BP 110/70 | HR 70 | Temp 98.4°F | Resp 16 | Ht 60.75 in | Wt 207.0 lb

## 2019-06-01 DIAGNOSIS — Z124 Encounter for screening for malignant neoplasm of cervix: Secondary | ICD-10-CM | POA: Insufficient documentation

## 2019-06-01 DIAGNOSIS — Z01419 Encounter for gynecological examination (general) (routine) without abnormal findings: Secondary | ICD-10-CM

## 2019-06-01 DIAGNOSIS — Z23 Encounter for immunization: Secondary | ICD-10-CM

## 2019-06-01 DIAGNOSIS — L900 Lichen sclerosus et atrophicus: Secondary | ICD-10-CM

## 2019-06-01 DIAGNOSIS — Z780 Asymptomatic menopausal state: Secondary | ICD-10-CM | POA: Diagnosis not present

## 2019-06-01 MED ORDER — CLOBETASOL PROPIONATE 0.05 % EX OINT: 1.0000 "application " | TOPICAL_OINTMENT | Freq: Two times a day (BID) | CUTANEOUS | 0 refills | Status: DC

## 2019-06-01 NOTE — Patient Instructions (Signed)
EXERCISE AND DIET:  We recommended that you start or continue a regular exercise program for good health. Regular exercise means any activity that makes your heart beat faster and makes you sweat.  We recommend exercising at least 30 minutes per day at least 3 days a week, preferably 4 or 5.  We also recommend a diet low in fat and sugar.  Inactivity, poor dietary choices and obesity can cause diabetes, heart attack, stroke, and kidney damage, among others.    ALCOHOL AND SMOKING:  Women should limit their alcohol intake to no more than 7 drinks/beers/glasses of wine (combined, not each!) per week. Moderation of alcohol intake to this level decreases your risk of breast cancer and liver damage. And of course, no recreational drugs are part of a healthy lifestyle.  And absolutely no smoking or even second hand smoke. Most people know smoking can cause heart and lung diseases, but did you know it also contributes to weakening of your bones? Aging of your skin?  Yellowing of your teeth and nails?  CALCIUM AND VITAMIN D:  Adequate intake of calcium and Vitamin D are recommended.  The recommendations for exact amounts of these supplements seem to change often, but generally speaking 600 mg of calcium (either carbonate or citrate) and 800 units of Vitamin D per day seems prudent. Certain women may benefit from higher intake of Vitamin D.  If you are among these women, your doctor will have told you during your visit.    PAP SMEARS:  Pap smears, to check for cervical cancer or precancers,  have traditionally been done yearly, although recent scientific advances have shown that most women can have pap smears less often.  However, every woman still should have a physical exam from her gynecologist every year. It will include a breast check, inspection of the vulva and vagina to check for abnormal growths or skin changes, a visual exam of the cervix, and then an exam to evaluate the size and shape of the uterus and  ovaries.  And after 68 years of age, a rectal exam is indicated to check for rectal cancers. We will also provide age appropriate advice regarding health maintenance, like when you should have certain vaccines, screening for sexually transmitted diseases, bone density testing, colonoscopy, mammograms, etc.   MAMMOGRAMS:  All women over 40 years old should have a yearly mammogram. Many facilities now offer a "3D" mammogram, which may cost around $50 extra out of pocket. If possible,  we recommend you accept the option to have the 3D mammogram performed.  It both reduces the number of women who will be called back for extra views which then turn out to be normal, and it is better than the routine mammogram at detecting truly abnormal areas.    COLONOSCOPY:  Colonoscopy to screen for colon cancer is recommended for all women at age 50.  We know, you hate the idea of the prep.  We agree, BUT, having colon cancer and not knowing it is worse!!  Colon cancer so often starts as a polyp that can be seen and removed at colonscopy, which can quite literally save your life!  And if your first colonoscopy is normal and you have no family history of colon cancer, most women don't have to have it again for 10 years.  Once every ten years, you can do something that may end up saving your life, right?  We will be happy to help you get it scheduled when you are ready.    Be sure to check your insurance coverage so you understand how much it will cost.  It may be covered as a preventative service at no cost, but you should check your particular policy.      Lichen Sclerosus Lichen sclerosus is a skin problem. It can happen on any part of the body, but it commonly involves the anal or genital areas. It can cause itching and discomfort in these areas. Treatment can help to control symptoms. When the genital area is affected, getting treatment is important because the condition can cause scarring that may lead to other  problems. What are the causes? The cause of this condition is not known. It may be related to an overactive immune system or a lack of certain hormones. Lichen sclerosus is not an infection or a fungus, and it is not passed from one person to another (not contagious). What increases the risk? This condition is more likely to develop in women, usually after menopause. What are the signs or symptoms? Symptoms of this condition include:  Thin, wrinkled, white areas on the skin.  Thickened white areas on the skin.  Red and swollen patches (lesions) on the skin.  Tears or cracks in the skin.  Bruising.  Blood blisters.  Severe itching.  Pain, itching, or burning when urinating. Constipation is also common in people with lichen sclerosus. How is this diagnosed? This condition may be diagnosed with a physical exam. In some cases, a tissue sample (biopsy sample) may be removed to be looked at under a microscope. How is this treated? This condition is usually treated with medicated creams or ointments (topical steroids) that are applied over the affected areas. In some cases, treatment may also include medicines that are taken by mouth. Surgery may be needed in more severe cases that are causing problems such as scarring. Follow these instructions at home:  Take or use over-the-counter and prescription medicines only as told by your health care provider.  Use creams or ointments as told by your health care provider.  Do not scratch the affected areas of skin.  If you are a woman, be sure to keep the vaginal area as clean and dry as possible.  Clean the affected area of skin gently with water. Avoid using rough towels or toilet paper.  Keep all follow-up visits as told by your health care provider. This is important. Contact a health care provider if:  You have increasing redness, swelling, or pain in the affected area.  You have fluid, blood, or pus coming from the affected  area.  You have new lesions on your skin.  You have a fever.  You have pain during sex. Summary  Lichen sclerosus is a skin problem. When the genital area is affected, getting treatment is important because the condition can cause scarring that may lead to other problems.  This condition is usually treated with medicated creams or ointments (topical steroids) that are applied over the affected areas.  Take or use over-the-counter and prescription medicines only as told by your health care provider.  Contact a health care provider if you have new lesions on your skin, have pain during sex, or have increasing redness, swelling, or pain in the affected area.  Keep all follow-up visits as told by your health care provider. This is important. This information is not intended to replace advice given to you by your health care provider. Make sure you discuss any questions you have with your health care provider. Document Revised: 07/31/2017   Document Reviewed: 07/31/2017 Elsevier Patient Education  2020 [employer] Inc[employer]  

## 2019-06-01 NOTE — Progress Notes (Signed)
69 y.o. G33P2002 Widowed  Caucasian Fe here for annual exam.  Post menopausal denies vaginal bleeding, but some vaginal itching with external skin for the past week. Patient has Lichen Planus of mouth and treats periodically.. Attending [place] weight loss program and enjoying participating. Sees Dr. Paulino Rily for aex and labs, management of thyroid, cholesterol and anxiety. Now on Metformin and Vitamin D, Synthroid( stable now) Lipitor and Buspar. Has had multiple orthopedic surgeries, considering seeing Rheumatology due to pain concerns. Has not returned to smoking since 1978! No other health issues today.  Patient's last menstrual period was 12/27/2002.          Sexually active: No.  The current method of family planning is post menopausal status.    Exercising: No.  exercise Smoker:  no  Review of Systems  Constitutional: Negative.   HENT: Negative.   Eyes: Negative.   Respiratory: Negative.   Cardiovascular: Negative.   Gastrointestinal: Negative.   Genitourinary:       Vaginal itchiing  Musculoskeletal: Negative.   Skin: Negative.   Neurological: Negative.   Endo/Heme/Allergies: Negative.   Psychiatric/Behavioral: Negative.     Health Maintenance: Pap:  05-16-15 neg HPV HR neg History of Abnormal Pap: no MMG:  05/2019 per patient Self Breast exams: yes Colonoscopy:  2020 per patient BMD:   2018 due this year TDaP:  2011 Shingles: 2019 Pneumonia: had done Hep C and HIV: HIV neg 2014, Hep c neg 2017 Labs: PCP   reports that she quit smoking about 42 years ago. Her smoking use included cigarettes. She smoked 0.50 packs per day. She has never used smokeless tobacco. She reports current alcohol use of about 2.0 standard drinks of alcohol per week. She reports that she does not use drugs.  Past Medical History:  Diagnosis Date  . Anemia   . Anxiety   . Arthritis    SEVERE PAIN RIGHT HIP  . Blood in urine    "ALWAYS"  --AND STATES ALWAYS TREATED FOR UTI--BUT HAS NEVER HAD  UTI.  . Cellulitis   . Chronic back pain   . Complication of anesthesia    "SCRATCHED CORNEA"  WAKING UP FROM  KNEE SURGERY--'82  . Depression    PT'S HUSBAND DIED OF CANCER AT [facility] February 09, 2012  . Gastric ulcer   . Gastritis    HISTORY OF GASTRITIS--OCCAS ABDOMINAL PAIN   . GERD (gastroesophageal reflux disease)    takes Omeprazole daily and states only bc she takes Mobic  . H/O hiatal hernia   . Headache(784.0)    MIGRAINE- INFREQUENT  . History of migraine    takes Topamax daily and Imitrex prn  . History of shingles   . Hyperlipidemia    takes Lipitor daily  . Hypothyroidism 05/13/12  . Insomnia    takes Xanax and Ativan prn  . Joint pain   . Joint swelling   . Lichenoid dermatitis 03/16/2007   perianal biopsy  . Nocturia   . Osteoarthritis   . Recurrent sinus infections   . Stomach ulcer   . Vitamin D deficiency     Past Surgical History:  Procedure Laterality Date  . COLONOSCOPY    . epidural injections     x 2 in the past 4weeks  . ESOPHAGOGASTRODUODENOSCOPY    . EYE SURGERY  1994   RA surgery-  . HEMATOMA EVACUATION  12/09/2011   Procedure: EVACUATION HEMATOMA;  Surgeon: [name], MD;  Location: [facility];  Service: Orthopedics;  Laterality: Right;  Evacuation  of Hematoma Right Knee, I & D and Closed Manipulation of Right Knee  . IRRIGATION AND DEBRIDEMENT KNEE  12/09/2011   Procedure: IRRIGATION AND DEBRIDEMENT KNEE;  Surgeon: [name], MD;  Location: [facility];  Service: Orthopedics;  Laterality: Right;  . JOINT REPLACEMENT     RIGHT TOTAL KNEE REPLACEMENT AND RT TOTAL KNEE REVISION    AND  LEFT TOTAL KNEE REPLACEMENT  . KNEE CLOSED REDUCTION  12/09/2011   Procedure: CLOSED MANIPULATION KNEE;  Surgeon: [name], MD;  Location: [facility];  Service: Orthopedics;  Laterality: Right;  . LUMBAR SPINE SURGERY  05/15/12   3 disc fusion  . TONSILLECTOMY    . TOTAL HIP ARTHROPLASTY  06/25/2011   Procedure: TOTAL HIP ARTHROPLASTY ANTERIOR APPROACH;  Surgeon: [name], MD;  Location: [facility];  Service: Orthopedics;  Laterality: Left;  . TOTAL HIP ARTHROPLASTY  03/02/2012   Procedure: TOTAL HIP ARTHROPLASTY ANTERIOR APPROACH;  Surgeon: [name], MD;  Location: [facility];  Service: Orthopedics;  Laterality: Right;  . TOTAL KNEE REVISION  10/28/2011   Procedure: TOTAL KNEE REVISION;  Surgeon: [name], MD;  Location: [facility];  Service: Orthopedics;  Laterality: Right;  . TOTAL SHOULDER ARTHROPLASTY Right 01/03/2017   Procedure: RIGHT SHOULDER ANATOMIC SHOULDER REPLACEMENT AND ROTATOR CUFF REPAIR;  Surgeon: Beverely Low, MD;  Location: [facility];  Service: Orthopedics;  Laterality: Right;  . TUBAL LIGATION    . WOUND EXPLORATION Bilateral 07/06/2012   Procedure: WOUND EXPLORATION lumbar;  Surgeon: Temple Pacini, MD;  Location: [facility];  Service: Neurosurgery;  Laterality: Bilateral;  Incision and Drainage    Current Outpatient Medications  Medication Sig Dispense Refill  . atorvastatin (LIPITOR) 40 MG tablet Take 40 mg by mouth every evening.     . busPIRone (BUSPAR) 5 MG tablet Take 5 mg by mouth 2 (two) times daily.    . Calcium Carb-Cholecalciferol (CALCIUM 600+D3 PO) Take 1 tablet by mouth 2 (two) times daily.    . cetirizine (ZYRTEC) 10 MG tablet Take 10 mg by mouth daily.    . hydrocortisone 2.5 % cream APPLY TO AFFECTED AREA EVERY DAY    . levothyroxine (SYNTHROID) 100 MCG tablet Take 100 mcg by mouth daily before breakfast.    . meloxicam (MOBIC) 15 MG tablet Take 15 mg by mouth daily.    . metFORMIN (GLUCOPHAGE) 500 MG tablet Take 1 tablet (500 mg total) by mouth daily with breakfast. 30 tablet 0  . Multiple Vitamin (MULTIVITAMIN WITH MINERALS) TABS Take 1 tablet by mouth daily.    . Multiple Vitamins-Minerals (PRESERVISION AREDS 2+MULTI VIT PO) Take 1 capsule by mouth 2 (two) times daily.    . pantoprazole (PROTONIX) 40 MG tablet Take 40 mg by mouth daily.    . sertraline (ZOLOFT) 50 MG tablet Take 50 mg by mouth. Take 2 tablets by mouth  daily    . SUMAtriptan (IMITREX) 100 MG tablet Take 100 mg by mouth every 2 (two) hours as needed for migraine.     . triamcinolone cream (KENALOG) 0.1 %     . Vitamin D, Ergocalciferol, (DRISDOL) 1.25 MG (50000 UNIT) CAPS capsule Take 1 capsule (50,000 Units total) by mouth every 7 (seven) days. 4 capsule 0  . metronidazole (NORITATE) 1 % cream Apply topically daily as needed.     No current facility-administered medications for this visit.    Family History  Problem Relation Age of Onset  . Diabetes Mother   .  Hypertension Mother   . Thyroid disease Mother   . Depression Mother   . Anxiety disorder Mother   . Obesity Mother   . Thyroid disease Father   . Heart failure Father   . Hypertension Father   . Hyperlipidemia Father   . Heart disease Father     ROS:  Pertinent items are noted in HPI.  Otherwise, a comprehensive ROS was negative.  Exam:   BP 110/70   Pulse 70   Temp 98.4 F (36.9 C) (Skin)   Resp 16   Ht 5' 0.75" (1.543 m)   Wt 207 lb (93.9 kg)   LMP 12/27/2002   BMI 39.43 kg/m  Height: 5' 0.75" (154.3 cm) Ht Readings from Last 3 Encounters:  06/01/19 5' 0.75" (1.543 m)  05/24/19 5\' 1"  (1.549 m)  05/10/19 5\' 1"  (1.549 m)    General appearance: alert, cooperative and appears stated age Head: Normocephalic, without obvious abnormality, atraumatic Neck: no adenopathy, supple, symmetrical, trachea midline and thyroid normal to inspection and palpation Lungs: clear to auscultation bilaterally Breasts: normal appearance, no masses or tenderness, No nipple retraction or dimpling, No nipple discharge or bleeding, No axillary or supraclavicular adenopathy, Taught monthly breast self examination Heart: regular rate and rhythm Abdomen: soft, non-tender; no masses,  no organomegaly Extremities: extremities normal, atraumatic, no cyanosis or edema Skin: Skin color, texture, turgor normal. No rashes or lesions Lymph nodes: Cervical, supraclavicular, and axillary nodes  normal. No abnormal inguinal nodes palpated Neurologic: Grossly normal   Pelvic: External genitalia:  no lesions              Urethra:  normal appearing urethra with no masses, tenderness or lesions              Bartholin's and Skene's: normal                 Vagina: normal appearing vagina with normal color and discharge, no lesions              Cervix: no cervical motion tenderness, no lesions and normal appearance              Pap taken: Yes.   Bimanual Exam:  Uterus:  normal size, contour, position, consistency, mobility, non-tender and anteverted              Adnexa: normal adnexa and no mass, fullness, tenderness               Rectovaginal: Confirms               Anus:  normal sphincter tone, no lesions, hemorrhoid tag noted with decrease pigmentation around anal opening extending upward with Lichen Sclerosus appearance. Shown to patient in mirror.  Chaperone present: yes  A:  Well Woman with normal exam  Post menopausal no HRT  Lichen Sclerosus around anal opening  History of Lichen Planus oral cavity  Glucose, cholesterol, thyroid, anxiety, migraine management with PCP  Immunization due    P:   Reviewed health and wellness pertinent to exam  Discussed importance of notifying if vaginal bleeding.  Discussed Lichen Sclerosus finding around anal area and etiology. Discussed treatment with Clobetasol ointment.  Rx Clobetasol ointment twice daily x 2 weeks and recheck. Warning given with use.  Continue follow up with dentist with Lichen planus when occurs.  Continue follow up with PCP as indicated.  Requests TDAP  Pap smear: yes   counseled on breast self exam, mammography screening, feminine hygiene, menopause, adequate intake of  calcium and vitamin D, diet and exercise  return annually or prn  An After Visit Summary was printed and given to the patient.

## 2019-06-02 DIAGNOSIS — M1990 Unspecified osteoarthritis, unspecified site: Secondary | ICD-10-CM | POA: Diagnosis not present

## 2019-06-02 DIAGNOSIS — K219 Gastro-esophageal reflux disease without esophagitis: Secondary | ICD-10-CM | POA: Diagnosis not present

## 2019-06-02 DIAGNOSIS — L439 Lichen planus, unspecified: Secondary | ICD-10-CM | POA: Diagnosis not present

## 2019-06-02 DIAGNOSIS — K257 Chronic gastric ulcer without hemorrhage or perforation: Secondary | ICD-10-CM | POA: Diagnosis not present

## 2019-06-02 LAB — CYTOLOGY - PAP: Diagnosis: NEGATIVE

## 2019-06-07 ENCOUNTER — Ambulatory Visit (INDEPENDENT_AMBULATORY_CARE_PROVIDER_SITE_OTHER): Payer: Medicare HMO | Admitting: Family Medicine

## 2019-06-07 ENCOUNTER — Encounter (INDEPENDENT_AMBULATORY_CARE_PROVIDER_SITE_OTHER): Payer: Self-pay | Admitting: Family Medicine

## 2019-06-07 ENCOUNTER — Other Ambulatory Visit: Payer: Self-pay

## 2019-06-07 VITALS — BP 108/68 | HR 84 | Temp 97.9°F | Ht 61.0 in | Wt 200.0 lb

## 2019-06-07 DIAGNOSIS — R7303 Prediabetes: Secondary | ICD-10-CM

## 2019-06-07 DIAGNOSIS — E559 Vitamin D deficiency, unspecified: Secondary | ICD-10-CM

## 2019-06-07 DIAGNOSIS — Z6837 Body mass index (BMI) 37.0-37.9, adult: Secondary | ICD-10-CM

## 2019-06-07 MED ORDER — VITAMIN D (ERGOCALCIFEROL) 1.25 MG (50000 UNIT) PO CAPS: 50000.0000 [IU] | ORAL_CAPSULE | ORAL | 0 refills | Status: DC

## 2019-06-07 MED ORDER — METFORMIN HCL 500 MG PO TABS: 500.0000 mg | ORAL_TABLET | Freq: Every day | ORAL | 0 refills | Status: DC

## 2019-06-07 NOTE — Progress Notes (Signed)
Chief Complaint:   OBESITY Melissa Leach is here to discuss her progress with her obesity treatment plan along with follow-up of her obesity related diagnoses. Melissa Leach is on the Category 2 Plan + 100 calories and states she is following her eating plan approximately 80% of the time. Melissa Leach states she is doing 0 minutes 0 times per week.  Today's visit was #: 5 Starting weight: 216 lbs Starting date: 04/08/2019 Today's weight: 200 Today's date: 06/07/2019 Total lbs lost to date: 16 Total lbs lost since last in-office visit: 1  Interim History: Melissa Leach continues to do well with weight loss even though she has increased traveling. She is doing well with meal planning and strategies. Her hunger is controlled.  Subjective:   1. Vitamin D deficiency Melissa Leach is stable on Vit D, and she denies nausea or vomiting. She requests a refill today.  2. Pre-diabetes Melissa Leach is working on diet and weight loss. Her hunger is controlled, and she denies hypoglycemia.  Assessment/Plan:   1. Vitamin D deficiency Low Vitamin D level contributes to fatigue and are associated with obesity, breast, and colon cancer. We will refill prescription Vitamin D for 1 month. Melissa Leach will follow-up for routine testing of Vitamin D, at least 2-3 times per year to avoid over-replacement.  - Vitamin D, Ergocalciferol, (DRISDOL) 1.25 MG (50000 UNIT) CAPS capsule; Take 1 capsule (50,000 Units total) by mouth every 7 (seven) days.  Dispense: 4 capsule; Refill: 0  2. Pre-diabetes Melissa Leach will continue to work on weight loss, diet, exercise, and decreasing simple carbohydrates to help decrease the risk of diabetes. We will refill metformin for 1 month.  - metFORMIN (GLUCOPHAGE) 500 MG tablet; Take 1 tablet (500 mg total) by mouth daily with breakfast.  Dispense: 30 tablet; Refill: 0  3. Class 2 severe obesity with serious comorbidity and body mass index (BMI) of 37.0 to 37.9 in adult, unspecified obesity type (HCC) Melissa Leach  is currently in the action stage of change. As such, her goal is to continue with weight loss efforts. She has agreed to the Category 2 Plan.   Behavioral modification strategies: meal planning and cooking strategies.  Melissa Leach has agreed to follow-up with our clinic in 2 to 3 weeks. She was informed of the importance of frequent follow-up visits to maximize her success with intensive lifestyle modifications for her multiple health conditions.   Objective:   Blood pressure 108/68, pulse 84, temperature 97.9 F (36.6 C), temperature source Oral, height 5\' 1"  (1.549 m), weight 200 lb (90.7 kg), last menstrual period 12/27/2002, SpO2 98 %. Body mass index is 37.79 kg/m.  General: Cooperative, alert, well developed, in no acute distress. HEENT: Conjunctivae and lids unremarkable. Cardiovascular: Regular rhythm.  Lungs: Normal work of breathing. Neurologic: No focal deficits.   Lab Results  Component Value Date   CREATININE 0.85 04/08/2019   BUN 22 04/08/2019   NA 138 04/08/2019   K 4.3 04/08/2019   CL 100 04/08/2019   CO2 24 04/08/2019   Lab Results  Component Value Date   ALT 36 (H) 04/08/2019   AST 30 04/08/2019   ALKPHOS 157 (H) 04/08/2019   BILITOT 0.4 04/08/2019   Lab Results  Component Value Date   HGBA1C 5.6 04/08/2019   Lab Results  Component Value Date   INSULIN 12.1 04/08/2019   Lab Results  Component Value Date   TSH 0.702 04/08/2019   Lab Results  Component Value Date   CHOL 213 (H) 04/08/2019   HDL 52 04/08/2019  LDLCALC 120 (H) 04/08/2019   TRIG 233 (H) 04/08/2019   Lab Results  Component Value Date   WBC 7.5 04/08/2019   HGB 12.5 04/08/2019   HCT 37.6 04/08/2019   MCV 92 04/08/2019   PLT 309 04/08/2019   No results found for: IRON, TIBC, FERRITIN  Obesity Behavioral Intervention Documentation for Insurance:   Approximately 15 minutes were spent on the discussion below.  ASK: We discussed the diagnosis of obesity with Lesia today and  Melissa Leach agreed to give [place] permission to discuss obesity behavioral modification therapy today.  ASSESS: Melissa Leach has the diagnosis of obesity and her BMI today is 37.81. Melissa Leach is in the action stage of change.   ADVISE: Melissa Leach was educated on the multiple health risks of obesity as well as the benefit of weight loss to improve her health. She was advised of the need for long term treatment and the importance of lifestyle modifications to improve her current health and to decrease her risk of future health problems.  AGREE: Multiple dietary modification options and treatment options were discussed and Melissa Leach agreed to follow the recommendations documented in the above note.  ARRANGE: Melissa Leach was educated on the importance of frequent visits to treat obesity as outlined per CMS and USPSTF guidelines and agreed to schedule her next follow up appointment today.  Attestation Statements:   Reviewed by clinician on day of visit: allergies, medications, problem list, medical history, surgical history, family history, social history, and previous encounter notes.   I, Burt Knack, am acting as transcriptionist for Quillian Quince, MD.  I have reviewed the above documentation for accuracy and completeness, and I agree with the above. -  Quillian Quince, MD

## 2019-06-08 DIAGNOSIS — L309 Dermatitis, unspecified: Secondary | ICD-10-CM | POA: Diagnosis not present

## 2019-06-08 DIAGNOSIS — L570 Actinic keratosis: Secondary | ICD-10-CM | POA: Diagnosis not present

## 2019-06-10 ENCOUNTER — Other Ambulatory Visit: Payer: Self-pay

## 2019-06-10 ENCOUNTER — Ambulatory Visit (INDEPENDENT_AMBULATORY_CARE_PROVIDER_SITE_OTHER): Payer: Medicare HMO | Admitting: Psychology

## 2019-06-10 DIAGNOSIS — F5089 Other specified eating disorder: Secondary | ICD-10-CM | POA: Diagnosis not present

## 2019-06-10 DIAGNOSIS — F3289 Other specified depressive episodes: Secondary | ICD-10-CM | POA: Diagnosis not present

## 2019-06-10 DIAGNOSIS — R69 Illness, unspecified: Secondary | ICD-10-CM | POA: Diagnosis not present

## 2019-06-14 DIAGNOSIS — E669 Obesity, unspecified: Secondary | ICD-10-CM | POA: Diagnosis not present

## 2019-06-14 DIAGNOSIS — M2550 Pain in unspecified joint: Secondary | ICD-10-CM | POA: Diagnosis not present

## 2019-06-14 DIAGNOSIS — Z6838 Body mass index (BMI) 38.0-38.9, adult: Secondary | ICD-10-CM | POA: Diagnosis not present

## 2019-06-14 DIAGNOSIS — M150 Primary generalized (osteo)arthritis: Secondary | ICD-10-CM | POA: Diagnosis not present

## 2019-06-15 ENCOUNTER — Encounter: Payer: Self-pay | Admitting: Certified Nurse Midwife

## 2019-06-15 ENCOUNTER — Other Ambulatory Visit: Payer: Self-pay

## 2019-06-15 ENCOUNTER — Ambulatory Visit (INDEPENDENT_AMBULATORY_CARE_PROVIDER_SITE_OTHER): Payer: Medicare HMO | Admitting: Certified Nurse Midwife

## 2019-06-15 VITALS — BP 106/68 | HR 68 | Temp 97.9°F | Resp 16 | Wt 204.0 lb

## 2019-06-15 DIAGNOSIS — L900 Lichen sclerosus et atrophicus: Secondary | ICD-10-CM | POA: Diagnosis not present

## 2019-06-15 NOTE — Patient Instructions (Signed)
Lichen Sclerosus Lichen sclerosus is a skin problem. It can happen on any part of the body. It happens most often in the anal or genital areas. It can cause itching and discomfort. Treatment can help to control symptoms. It can also help prevent scarring that may lead to other problems. What are the causes? The cause of this condition is not known. It is not passed from one person to another (not contagious). What increases the risk? This condition is more likely to develop in women. It most often occurs after menopause. What are the signs or symptoms? Symptoms of this condition include:  Thin, wrinkled, white areas on the skin.  Thickened white areas on the skin.  Red and swollen patches (lesions) on the skin.  Tears or cracks in the skin.  Bruising.  Blood blisters.  Very bad itching.  Pain, itching, or burning when peeing (urinating).  Trouble pooping (constipation). How is this diagnosed? This condition may be diagnosed with a physical exam. A sample of your skin may also be removed to be looked at under a microscope (biopsy). How is this treated? This condition may be treated with:  Creams or ointments (topical steroids) that are put on the skin in the affected areas. This is the most common treatment.  Medicines that are taken by mouth.  Surgery. This is only needed if the condition is very bad and is causing problems such as scarring. Follow these instructions at home:  Take or use over-the-counter and prescription medicines only as told by your doctor.  Use creams or ointments as told by your doctor.  Do not scratch the affected areas of skin.  If you are a woman, keep the vagina as clean and dry as you can.  Clean the affected area of skin gently with water. Avoid using rough towels or toilet paper.  Keep all follow-up visits as told by your doctor. This is important. Contact a doctor if:  Your redness, swelling, or pain gets worse.  You have fluid,  blood, or pus coming from the area.  You have new red and swollen patches on your skin.  You have a fever.  You have pain during sex. Summary  Lichen sclerosus is a skin problem. It can cause itching and discomfort.  This condition is usually treated with creams or ointments that are put on the skin in the affected areas.  Use medicines only as told by your doctor.  Do not scratch the affected areas of skin.  Keep all follow-up visits as told by your doctor. This is important. This information is not intended to replace advice given to you by your health care provider. Make sure you discuss any questions you have with your health care provider. Document Revised: 07/31/2017 Document Reviewed: 07/31/2017 Elsevier Patient Education  2020 [employer]  

## 2019-06-15 NOTE — Progress Notes (Signed)
Review of Systems  Constitutional: Negative.   HENT: Negative.   Eyes: Negative.   Respiratory: Negative.   Cardiovascular: Negative.   Gastrointestinal: Negative.   Genitourinary: Negative.   Musculoskeletal: Negative.   Skin: Negative.   Neurological: Negative.   Endo/Heme/Allergies: Negative.   Psychiatric/Behavioral: Negative.     69 y.o. widowed Caucasian female G2P2002 here for follow up of Lichen Sclerosis in perineal/anal area treated with Clobetasol initiated on 06/02/19. Area of concern was noted on aex and had been chronic problem per patient. Denies vaginal bleeding or vaginal dryness. Denies discomfort in the fourchette and anal area now. "Feels so much better". Patient using medication once daily now. Used  medication as directed. No other health issues today   O: Healthy WD,WN female Affect: normal, orientation x 3 Skin:warm and dry Abdomen:soft, no masses or tenderness Inguinal lymph nodes: non tender, not enlarged Pelvic exam:EXTERNAL GENITALIA: normal appearing vulva with no masses, tenderness or lesions VAGINA: no abnormal discharge or lesions, fourchette area of treatment no longer has decreased pigmentation and redness, non tender Anal area; slight decrease of pigmentation noted, but no redness or cracked skin noted , non tender, no new areas Physical Exam Genitourinary:         A:Lichen Sclerosis vaginal/perineal area Responsive to Rx Clobetasol   P: Discussed findings of improved appearance and decrease Lichen activity. Continue Clobetasol once daily x 1 week and stop. If she has symptoms to reoccur, start twice daily for two weeks if no change needs OV. Question addressed.  Rv prn

## 2019-06-22 ENCOUNTER — Encounter: Payer: Self-pay | Admitting: Certified Nurse Midwife

## 2019-06-23 ENCOUNTER — Encounter (INDEPENDENT_AMBULATORY_CARE_PROVIDER_SITE_OTHER): Payer: Self-pay | Admitting: Family Medicine

## 2019-06-23 ENCOUNTER — Ambulatory Visit (INDEPENDENT_AMBULATORY_CARE_PROVIDER_SITE_OTHER): Payer: Medicare HMO | Admitting: Family Medicine

## 2019-06-23 ENCOUNTER — Other Ambulatory Visit: Payer: Self-pay

## 2019-06-23 VITALS — BP 102/67 | HR 71 | Temp 97.7°F | Ht 61.0 in | Wt 199.0 lb

## 2019-06-23 DIAGNOSIS — R69 Illness, unspecified: Secondary | ICD-10-CM | POA: Diagnosis not present

## 2019-06-23 DIAGNOSIS — F3289 Other specified depressive episodes: Secondary | ICD-10-CM | POA: Diagnosis not present

## 2019-06-23 DIAGNOSIS — Z6837 Body mass index (BMI) 37.0-37.9, adult: Secondary | ICD-10-CM

## 2019-06-23 DIAGNOSIS — F418 Other specified anxiety disorders: Secondary | ICD-10-CM | POA: Diagnosis not present

## 2019-06-23 NOTE — Progress Notes (Signed)
Chief Complaint:   OBESITY Delailah is here to discuss her progress with her obesity treatment plan along with follow-up of her obesity related diagnoses. Bailey is on the Category 2 Plan and states she is following her eating plan approximately 80% of the time. Zakira states she is doing 0 minutes 0 times per week.  Today's visit was #: 6 Starting weight: 216 lbs Starting date: 04/08/2019 Today's weight: 199 lbs Today's date: 06/23/2019 Total lbs lost to date: 17 Total lbs lost since last in-office visit: 1  Interim History: Alysha states "I eat too much food on the diet", specifically the Special K cereal. She has been bagging up individual servings of Skinny Pop, but will consume multiple servings. We discussed changes to either the Vegetarian or Pescatarian meal plan, but she is happy on Category 2 meal plan.  Subjective:   1. Depression with anxiety Eesha reports her mood is stable. She is currently on sertraline 50 mg 2 tablets q daily and Buspar 5 mg BID.  2. Other depression, with emotional eating Doriann reports increase in snacking, however only on foods that are on the prescribed meal plan.  Assessment/Plan:   1. Depression with anxiety Behavior modification techniques were discussed today to help Shakela deal with her depression and anxiety. Seraphina agreed to continue her SSRI and Buspar regimen. Orders and follow up as documented in patient record.   2. Other depression, with emotional eating Behavior modification techniques were discussed today to help Xavia deal with her emotional/non-hunger eating behaviors. Tahesha agreed to continue her SSRI and Buspar regimen. Orders and follow up as documented in patient record.   3. Class 2 severe obesity with serious comorbidity and body mass index (BMI) of 37.0 to 37.9 in adult, unspecified obesity type (HCC) Quantina is currently in the action stage of change. As such, her goal is to continue with weight loss efforts.  She has agreed to the Category 2 Plan.   Exercise goals: No exercise has been prescribed at this time.  Behavioral modification strategies: ways to avoid night time snacking.  Flecia has agreed to follow-up with our clinic in 3 to 4 weeks. She was informed of the importance of frequent follow-up visits to maximize her success with intensive lifestyle modifications for her multiple health conditions.   Objective:   Blood pressure 102/67, pulse 71, temperature 97.7 F (36.5 C), temperature source Oral, height 5\' 1"  (1.549 m), weight 199 lb (90.3 kg), last menstrual period 12/27/2002, SpO2 98 %. Body mass index is 37.6 kg/m.  General: Cooperative, alert, well developed, in no acute distress. HEENT: Conjunctivae and lids unremarkable. Cardiovascular: Regular rhythm.  Lungs: Normal work of breathing. Neurologic: No focal deficits.   Lab Results  Component Value Date   CREATININE 0.85 04/08/2019   BUN 22 04/08/2019   NA 138 04/08/2019   K 4.3 04/08/2019   CL 100 04/08/2019   CO2 24 04/08/2019   Lab Results  Component Value Date   ALT 36 (H) 04/08/2019   AST 30 04/08/2019   ALKPHOS 157 (H) 04/08/2019   BILITOT 0.4 04/08/2019   Lab Results  Component Value Date   HGBA1C 5.6 04/08/2019   Lab Results  Component Value Date   INSULIN 12.1 04/08/2019   Lab Results  Component Value Date   TSH 0.702 04/08/2019   Lab Results  Component Value Date   CHOL 213 (H) 04/08/2019   HDL 52 04/08/2019   LDLCALC 120 (H) 04/08/2019   TRIG 233 (H)  04/08/2019   Lab Results  Component Value Date   WBC 7.5 04/08/2019   HGB 12.5 04/08/2019   HCT 37.6 04/08/2019   MCV 92 04/08/2019   PLT 309 04/08/2019   No results found for: IRON, TIBC, FERRITIN  Attestation Statements:   Reviewed by clinician on day of visit: allergies, medications, problem list, medical history, surgical history, family history, social history, and previous encounter notes.  Time spent on visit including  pre-visit chart review and post-visit care and charting was 24 minutes.    I, Trixie Dredge, am acting as transcriptionist for Dennard Nip, MD.  I have reviewed the above documentation for accuracy and completeness, and I agree with the above. -  Dennard Nip, MD

## 2019-06-28 ENCOUNTER — Other Ambulatory Visit (INDEPENDENT_AMBULATORY_CARE_PROVIDER_SITE_OTHER): Payer: Self-pay | Admitting: Family Medicine

## 2019-06-28 DIAGNOSIS — E559 Vitamin D deficiency, unspecified: Secondary | ICD-10-CM

## 2019-06-29 ENCOUNTER — Ambulatory Visit (INDEPENDENT_AMBULATORY_CARE_PROVIDER_SITE_OTHER): Payer: Medicare HMO | Admitting: Family Medicine

## 2019-07-07 ENCOUNTER — Other Ambulatory Visit (INDEPENDENT_AMBULATORY_CARE_PROVIDER_SITE_OTHER): Payer: Self-pay | Admitting: Family Medicine

## 2019-07-07 DIAGNOSIS — R7303 Prediabetes: Secondary | ICD-10-CM

## 2019-07-20 ENCOUNTER — Other Ambulatory Visit (INDEPENDENT_AMBULATORY_CARE_PROVIDER_SITE_OTHER): Payer: Self-pay | Admitting: Family Medicine

## 2019-07-20 DIAGNOSIS — E559 Vitamin D deficiency, unspecified: Secondary | ICD-10-CM

## 2019-07-22 ENCOUNTER — Other Ambulatory Visit: Payer: Self-pay

## 2019-07-22 ENCOUNTER — Ambulatory Visit (INDEPENDENT_AMBULATORY_CARE_PROVIDER_SITE_OTHER): Payer: Medicare HMO | Admitting: Family Medicine

## 2019-07-22 ENCOUNTER — Encounter (INDEPENDENT_AMBULATORY_CARE_PROVIDER_SITE_OTHER): Payer: Self-pay | Admitting: Family Medicine

## 2019-07-22 VITALS — BP 104/69 | HR 76 | Temp 97.6°F | Ht 61.0 in | Wt 197.0 lb

## 2019-07-22 DIAGNOSIS — E038 Other specified hypothyroidism: Secondary | ICD-10-CM | POA: Diagnosis not present

## 2019-07-22 DIAGNOSIS — Z6837 Body mass index (BMI) 37.0-37.9, adult: Secondary | ICD-10-CM | POA: Diagnosis not present

## 2019-07-22 DIAGNOSIS — R7303 Prediabetes: Secondary | ICD-10-CM

## 2019-07-22 DIAGNOSIS — E559 Vitamin D deficiency, unspecified: Secondary | ICD-10-CM | POA: Diagnosis not present

## 2019-07-22 DIAGNOSIS — E7800 Pure hypercholesterolemia, unspecified: Secondary | ICD-10-CM | POA: Diagnosis not present

## 2019-07-22 MED ORDER — VITAMIN D (ERGOCALCIFEROL) 1.25 MG (50000 UNIT) PO CAPS: ORAL_CAPSULE | ORAL | 0 refills | Status: DC

## 2019-07-22 MED ORDER — METFORMIN HCL 500 MG PO TABS: 500.0000 mg | ORAL_TABLET | Freq: Every day | ORAL | 0 refills | Status: DC

## 2019-07-23 DIAGNOSIS — L570 Actinic keratosis: Secondary | ICD-10-CM | POA: Diagnosis not present

## 2019-07-23 DIAGNOSIS — L718 Other rosacea: Secondary | ICD-10-CM | POA: Diagnosis not present

## 2019-07-23 LAB — COMPREHENSIVE METABOLIC PANEL
ALT: 18 IU/L (ref 0–32)
AST: 24 IU/L (ref 0–40)
Albumin/Globulin Ratio: 1.8 (ref 1.2–2.2)
Albumin: 4.4 g/dL (ref 3.8–4.8)
Alkaline Phosphatase: 116 IU/L (ref 39–117)
BUN/Creatinine Ratio: 20 (ref 12–28)
BUN: 18 mg/dL (ref 8–27)
Bilirubin Total: 0.5 mg/dL (ref 0.0–1.2)
CO2: 24 mmol/L (ref 20–29)
Calcium: 9.5 mg/dL (ref 8.7–10.3)
Chloride: 97 mmol/L (ref 96–106)
Creatinine, Ser: 0.92 mg/dL (ref 0.57–1.00)
GFR calc Af Amer: 75 mL/min/{1.73_m2} (ref >59)
GFR calc non Af Amer: 65 mL/min/{1.73_m2} (ref >59)
Globulin, Total: 2.5 g/dL (ref 1.5–4.5)
Glucose: 96 mg/dL (ref 65–99)
Potassium: 4.4 mmol/L (ref 3.5–5.2)
Sodium: 134 mmol/L (ref 134–144)
Total Protein: 6.9 g/dL (ref 6.0–8.5)

## 2019-07-23 LAB — LIPID PANEL WITH LDL/HDL RATIO
Cholesterol, Total: 118 mg/dL (ref 100–199)
HDL: 47 mg/dL (ref >39)
LDL Chol Calc (NIH): 49 mg/dL (ref 0–99)
LDL/HDL Ratio: 1 ratio (ref 0.0–3.2)
Triglycerides: 124 mg/dL (ref 0–149)
VLDL Cholesterol Cal: 22 mg/dL (ref 5–40)

## 2019-07-23 LAB — INSULIN, RANDOM: INSULIN: 9.8 u[IU]/mL (ref 2.6–24.9)

## 2019-07-23 LAB — T4, FREE: Free T4: 1.67 ng/dL (ref 0.82–1.77)

## 2019-07-23 LAB — T3: T3, Total: 81 ng/dL (ref 71–180)

## 2019-07-23 LAB — VITAMIN D 25 HYDROXY (VIT D DEFICIENCY, FRACTURES): Vit D, 25-Hydroxy: 80.9 ng/mL (ref 30.0–100.0)

## 2019-07-23 LAB — HEMOGLOBIN A1C
Est. average glucose Bld gHb Est-mCnc: 111 mg/dL
Hgb A1c MFr Bld: 5.5 % (ref 4.8–5.6)

## 2019-07-23 LAB — TSH: TSH: 0.923 u[IU]/mL (ref 0.450–4.500)

## 2019-07-26 ENCOUNTER — Encounter (INDEPENDENT_AMBULATORY_CARE_PROVIDER_SITE_OTHER): Payer: Self-pay | Admitting: Family Medicine

## 2019-07-26 DIAGNOSIS — E038 Other specified hypothyroidism: Secondary | ICD-10-CM | POA: Insufficient documentation

## 2019-07-26 DIAGNOSIS — E7800 Pure hypercholesterolemia, unspecified: Secondary | ICD-10-CM | POA: Insufficient documentation

## 2019-07-26 NOTE — Progress Notes (Signed)
Chief Complaint:   OBESITY Melissa Leach is here to discuss her progress with her obesity treatment plan along with follow-up of her obesity related diagnoses. Melissa Leach is on the Category 2 Plan and states she is following her eating plan approximately 85% of the time. Melissa Leach states she is walking 10 minutes 7 times per week.  Today's visit was #: 7 Starting weight: 216 lbs Starting date: 04/08/2019 Today's weight: 197 lbs Today's date: 07/22/2019 Total lbs lost to date: 19 Total lbs lost since last in-office visit: 2  Interim History: Melissa Leach recently went on vacation, but still lost 2 lbs. She reports not having much hunger. She states she would like to lose weight more quickly.   Subjective:   Prediabetes. Melissa Leach has a diagnosis of prediabetes based on her elevated HgA1c and was informed this puts her at greater risk of developing diabetes. She continues to work on diet and exercise to decrease her risk of diabetes. She denies nausea or hypoglycemia. Melissa Leach is on metformin. No polyphagia.  Lab Results  Component Value Date   HGBA1C 5.5 07/22/2019   Lab Results  Component Value Date   INSULIN 9.8 07/22/2019   INSULIN 12.1 04/08/2019   Hypercholesterolemia. Last LDL was high at 120 at last check on 04/08/2019. Melissa Leach is on atorvastatin. Triglycerides high at 233 on 04/08/2019 with HDL at goal at 52. Lab Results  Component Value Date   CHOL 118 07/22/2019   HDL 47 07/22/2019   LDLCALC 49 07/22/2019   TRIG 124 07/22/2019    Other specified hypothyroidism. Melissa Leach states she feels well on levothyroxine 100 mcg daily.   Lab Results  Component Value Date   TSH 0.923 07/22/2019   Vitamin D deficiency. Last Vitamin D level low at 31.2 on 04/08/2019. Melissa Leach is on high dose Vitamin D weekly.  Assessment/Plan:   Prediabetes. Melissa Leach will continue to work on weight loss, exercise, and decreasing simple carbohydrates to help decrease the risk of diabetes. Comprehensive  metabolic panel, Hemoglobin A1c, Insulin, random labs ordered. Refill was given for metFORMIN (GLUCOPHAGE) 500 MG tablet daily with breakfast #30 with 0 refills.  Hypercholesterolemia. Melissa Leach will have follow-up Lipid Panel With LDL/HDL Ratio today and will continue atorvastatin as directed.  Other specified hypothyroidism.Patient with long-standing hypothyroidism, on levothyroxine therapy. She appears euthyroid. Orders and follow up as documented in patient record. Will checkT3, T4, free, TSH levels today. She will continue levothyroxine at 100 mcg daily.   Vitamin D deficiency. Low Vitamin D level contributes to fatigue and are associated with obesity, breast, and colon cancer. She was given a refill on her Vitamin D, Ergocalciferol, (DRISDOL) 1.25 MG (50000 UNIT) CAPS capsule every week #4 with 0 refills and VITAMIN D 25 Hydroxy (Vit-D Deficiency, Fractures) level was ordered today.    Class 2 severe obesity with serious comorbidity and body mass index (BMI) of 37.0 to 37.9 in adult, unspecified obesity type ([place]).  Melissa Leach is currently in the action stage of change. As such, her goal is to continue with weight loss efforts. She has agreed to the Category 2 Plan.   Exercise goals: Melissa Leach will continue her current exercise regimen.  Behavioral modification strategies: decreasing simple carbohydrates and planning for success.  Melissa Leach has agreed to follow-up with our clinic in 3 weeks. She was informed of the importance of frequent follow-up visits to maximize her success with intensive lifestyle modifications for her multiple health conditions.   Melissa Leach was informed we would discuss her lab results at  her next visit unless there is a critical issue that needs to be addressed sooner. Melissa Leach agreed to keep her next visit at the agreed upon time to discuss these results.  Objective:   Blood pressure 104/69, pulse 76, temperature 97.6 F (36.4 C), temperature source Oral, height 5\' 1"  (1.549  m), weight 197 lb (89.4 kg), last menstrual period 12/27/2002, SpO2 99 %. Body mass index is 37.22 kg/m.  General: Cooperative, alert, well developed, in no acute distress. HEENT: Conjunctivae and lids unremarkable. Cardiovascular: Regular rhythm.  Lungs: Normal work of breathing. Neurologic: No focal deficits.   Lab Results  Component Value Date   CREATININE 0.92 07/22/2019   BUN 18 07/22/2019   NA 134 07/22/2019   K 4.4 07/22/2019   CL 97 07/22/2019   CO2 24 07/22/2019   Lab Results  Component Value Date   ALT 18 07/22/2019   AST 24 07/22/2019   ALKPHOS 116 07/22/2019   BILITOT 0.5 07/22/2019   Lab Results  Component Value Date   HGBA1C 5.5 07/22/2019   HGBA1C 5.6 04/08/2019   Lab Results  Component Value Date   INSULIN 9.8 07/22/2019   INSULIN 12.1 04/08/2019   Lab Results  Component Value Date   TSH 0.923 07/22/2019   Lab Results  Component Value Date   CHOL 118 07/22/2019   HDL 47 07/22/2019   LDLCALC 49 07/22/2019   TRIG 124 07/22/2019   Lab Results  Component Value Date   WBC 7.5 04/08/2019   HGB 12.5 04/08/2019   HCT 37.6 04/08/2019   MCV 92 04/08/2019   PLT 309 04/08/2019   No results found for: IRON, TIBC, FERRITIN  Obesity Behavioral Intervention Documentation for Insurance:   Approximately 15 minutes were spent on the discussion below.  ASK: We discussed the diagnosis of obesity with Melissa Leach today and Melissa Leach agreed to give Melissa Leach permission to discuss obesity behavioral modification therapy today.  ASSESS: Melissa Leach has the diagnosis of obesity and her BMI today is 37.3. Melissa Leach is in the action stage of change.   ADVISE: Melissa Leach was educated on the multiple health risks of obesity as well as the benefit of weight loss to improve her health. She was advised of the need for long term treatment and the importance of lifestyle modifications to improve her current health and to decrease her risk of future health problems.  AGREE: Multiple  dietary modification options and treatment options were discussed and Melissa Leach agreed to follow the recommendations documented in the above note.  ARRANGE: Melissa Leach was educated on the importance of frequent visits to treat obesity as outlined per CMS and USPSTF guidelines and agreed to schedule her next follow up appointment today.  Attestation Statements:   Reviewed by clinician on day of visit: allergies, medications, problem list, medical history, surgical history, family history, social history, and previous encounter notes.  IAram Leach, am acting as [place] [employer] for Energy manager, FNP   I have reviewed the above documentation for accuracy and completeness, and I agree with the above. -  [place], FNP

## 2019-08-11 ENCOUNTER — Other Ambulatory Visit: Payer: Self-pay

## 2019-08-11 ENCOUNTER — Ambulatory Visit (INDEPENDENT_AMBULATORY_CARE_PROVIDER_SITE_OTHER): Payer: Medicare HMO | Admitting: Family Medicine

## 2019-08-11 ENCOUNTER — Encounter (INDEPENDENT_AMBULATORY_CARE_PROVIDER_SITE_OTHER): Payer: Self-pay | Admitting: Family Medicine

## 2019-08-11 VITALS — BP 119/82 | HR 69 | Temp 97.6°F | Ht 61.0 in | Wt 196.0 lb

## 2019-08-11 DIAGNOSIS — Z6837 Body mass index (BMI) 37.0-37.9, adult: Secondary | ICD-10-CM | POA: Diagnosis not present

## 2019-08-11 DIAGNOSIS — R7303 Prediabetes: Secondary | ICD-10-CM | POA: Diagnosis not present

## 2019-08-11 DIAGNOSIS — E559 Vitamin D deficiency, unspecified: Secondary | ICD-10-CM | POA: Diagnosis not present

## 2019-08-11 MED ORDER — VITAMIN D (ERGOCALCIFEROL) 1.25 MG (50000 UNIT) PO CAPS: ORAL_CAPSULE | ORAL | 0 refills | Status: DC

## 2019-08-11 MED ORDER — METFORMIN HCL 500 MG PO TABS: 500.0000 mg | ORAL_TABLET | Freq: Every day | ORAL | 0 refills | Status: DC

## 2019-08-11 NOTE — Progress Notes (Signed)
Chief Complaint:   OBESITY Melissa Leach is here to discuss her progress with her obesity treatment plan along with follow-up of her obesity related diagnoses. Melissa Leach is on the Category 2 Plan and states she is following her eating plan approximately 80% of the time. Melissa Leach states she is walking the dog for 10 minutes 7 times per week.  Today's visit was #: 8 Starting weight: 216 lbs Starting date: 04/08/2019 Today's weight: 196 lbs Today's date: 08/11/2019 Total lbs lost to date: 20 Total lbs lost since last in-office visit: 1  Interim History: Melissa Leach states she feels great. She continues to enjoy daily salty snacks and cocktails several times per week. She has been loosely tracking her snack calories, usually exceeding by 100-200 calories per day.  Subjective:   1. Vitamin D deficiency Melissa Leach's Vit D level on 07/22/2019 was 80.9. She is on prescription strength Vit D supplementation, and she is tolerating it well. She is also on combination Vit D-calcium supplement.  2. Pre-diabetes Melissa Leach's A1c on 07/22/2019 was 5.5. She is on metformin, and is tolerating it well.   Assessment/Plan:   1. Vitamin D deficiency Low Vitamin D level contributes to fatigue and are associated with obesity, breast, and colon cancer. We will refill prescription Vitamin D for 1 month. Melissa Leach will follow-up for routine testing of Vitamin D, at least 2-3 times per year to avoid over-replacement. We will recheck labs in July 2021.  - Vitamin D, Ergocalciferol, (DRISDOL) 1.25 MG (50000 UNIT) CAPS capsule; TAKE 1 CAPSULE BY MOUTH EVERY 7 DAYS  Dispense: 4 capsule; Refill: 0  2. Pre-diabetes Melissa Leach will continue to work on weight loss, exercise, and decreasing simple carbohydrates to help decrease the risk of diabetes. we will refill metformin for 1 month.   - metFORMIN (GLUCOPHAGE) 500 MG tablet; Take 1 tablet (500 mg total) by mouth daily with breakfast.  Dispense: 30 tablet; Refill: 0  3. Class 2 severe  obesity with serious comorbidity and body mass index (BMI) of 37.0 to 37.9 in adult, unspecified obesity type (HCC) Melissa Leach is currently in the action stage of change. As such, her goal is to continue with weight loss efforts. She has agreed to the Category 2 Plan.   Melissa Leach is to accurately track her snack calories, and keep to <200 per day.  Exercise goals: As is.  Behavioral modification strategies: increasing lean protein intake, increasing water intake and better snacking choices.  Melissa Leach has agreed to follow-up with our clinic in 3 weeks. She was informed of the importance of frequent follow-up visits to maximize her success with intensive lifestyle modifications for her multiple health conditions.   Objective:   Blood pressure 119/82, pulse 69, temperature 97.6 F (36.4 C), temperature source Oral, height 5\' 1"  (1.549 m), weight 196 lb (88.9 kg), last menstrual period 12/27/2002, SpO2 99 %. Body mass index is 37.03 kg/m.  General: Cooperative, alert, well developed, in no acute distress. HEENT: Conjunctivae and lids unremarkable. Cardiovascular: Regular rhythm.  Lungs: Normal work of breathing. Neurologic: No focal deficits.   Lab Results  Component Value Date   CREATININE 0.92 07/22/2019   BUN 18 07/22/2019   NA 134 07/22/2019   K 4.4 07/22/2019   CL 97 07/22/2019   CO2 24 07/22/2019   Lab Results  Component Value Date   ALT 18 07/22/2019   AST 24 07/22/2019   ALKPHOS 116 07/22/2019   BILITOT 0.5 07/22/2019   Lab Results  Component Value Date   HGBA1C 5.5 07/22/2019  HGBA1C 5.6 04/08/2019   Lab Results  Component Value Date   INSULIN 9.8 07/22/2019   INSULIN 12.1 04/08/2019   Lab Results  Component Value Date   TSH 0.923 07/22/2019   Lab Results  Component Value Date   CHOL 118 07/22/2019   HDL 47 07/22/2019   LDLCALC 49 07/22/2019   TRIG 124 07/22/2019   Lab Results  Component Value Date   WBC 7.5 04/08/2019   HGB 12.5 04/08/2019   HCT 37.6  04/08/2019   MCV 92 04/08/2019   PLT 309 04/08/2019   No results found for: IRON, TIBC, FERRITIN  Obesity Behavioral Intervention Documentation for Insurance:   Approximately 15 minutes were spent on the discussion below.  ASK: We discussed the diagnosis of obesity with Melissa Leach today and Melissa Leach agreed to give [place] permission to discuss obesity behavioral modification therapy today.  ASSESS: Melissa Leach has the diagnosis of obesity and her BMI today is 37.05. Melissa Leach is in the action stage of change.   ADVISE: Melissa Leach was educated on the multiple health risks of obesity as well as the benefit of weight loss to improve her health. She was advised of the need for long term treatment and the importance of lifestyle modifications to improve her current health and to decrease her risk of future health problems.  AGREE: Multiple dietary modification options and treatment options were discussed and Melissa Leach agreed to follow the recommendations documented in the above note.  ARRANGE: Melissa Leach was educated on the importance of frequent visits to treat obesity as outlined per CMS and USPSTF guidelines and agreed to schedule her next follow up appointment today.  Attestation Statements:   Reviewed by clinician on day of visit: allergies, medications, problem list, medical history, surgical history, family history, social history, and previous encounter notes.   I, Trixie Dredge, am acting as transcriptionist for Dennard Nip, MD.  I have reviewed the above documentation for accuracy and completeness, and I agree with the above. -  Dennard Nip, MD

## 2019-08-22 ENCOUNTER — Other Ambulatory Visit (INDEPENDENT_AMBULATORY_CARE_PROVIDER_SITE_OTHER): Payer: Self-pay | Admitting: Family Medicine

## 2019-08-22 DIAGNOSIS — E559 Vitamin D deficiency, unspecified: Secondary | ICD-10-CM

## 2019-08-31 ENCOUNTER — Other Ambulatory Visit (INDEPENDENT_AMBULATORY_CARE_PROVIDER_SITE_OTHER): Payer: Self-pay | Admitting: Family Medicine

## 2019-08-31 DIAGNOSIS — E559 Vitamin D deficiency, unspecified: Secondary | ICD-10-CM

## 2019-09-05 ENCOUNTER — Other Ambulatory Visit (INDEPENDENT_AMBULATORY_CARE_PROVIDER_SITE_OTHER): Payer: Self-pay | Admitting: Family Medicine

## 2019-09-05 DIAGNOSIS — R7303 Prediabetes: Secondary | ICD-10-CM

## 2019-09-06 ENCOUNTER — Ambulatory Visit (INDEPENDENT_AMBULATORY_CARE_PROVIDER_SITE_OTHER): Payer: Medicare HMO | Admitting: Family Medicine

## 2019-09-06 ENCOUNTER — Encounter (INDEPENDENT_AMBULATORY_CARE_PROVIDER_SITE_OTHER): Payer: Self-pay | Admitting: Family Medicine

## 2019-09-06 ENCOUNTER — Other Ambulatory Visit: Payer: Self-pay

## 2019-09-06 VITALS — BP 121/76 | HR 69 | Temp 98.1°F | Ht 61.0 in | Wt 194.0 lb

## 2019-09-06 DIAGNOSIS — R7303 Prediabetes: Secondary | ICD-10-CM

## 2019-09-06 DIAGNOSIS — Z6836 Body mass index (BMI) 36.0-36.9, adult: Secondary | ICD-10-CM

## 2019-09-06 MED ORDER — METFORMIN HCL 500 MG PO TABS: 500.0000 mg | ORAL_TABLET | Freq: Every day | ORAL | 0 refills | Status: DC

## 2019-09-06 NOTE — Progress Notes (Signed)
Chief Complaint:   OBESITY Melissa Leach is here to discuss her progress with her obesity treatment plan along with follow-up of her obesity related diagnoses. Melissa Leach is on the Category 2 Plan and states she is following her eating plan approximately 80% of the time. Melissa Leach states she is walking the dogs for 30 minutes 7 times per week.  Today's visit was #: 9 Starting weight: 216 lbs Starting date: 04/08/2019 Today's weight: 194 lbs Today's date: 09/06/2019 Total lbs lost to date: 22 Total lbs lost since last in-office visit: 2  Interim History: Melissa Leach continues to do well with weight loss on her plan, but she would like more lunch options. Her hunger is controlled. She is frustrated that she is losing weight slowly though. She notes she eats more in snacks but not much more.  Subjective:   1. Pre-diabetes Melissa Leach is tolerating metformin well and she notes decreased polyphagia in the morning. She is doing well with weight loss and activity, and she will start physical therapy this week.  Assessment/Plan:   1. Pre-diabetes Melissa Leach will continue to work on weight loss, exercise, and decreasing simple carbohydrates to help decrease the risk of diabetes. We will refill metformin for 1 month.  - metFORMIN (GLUCOPHAGE) 500 MG tablet; Take 1 tablet (500 mg total) by mouth daily with breakfast.  Dispense: 30 tablet; Refill: 0  2. Class 2 severe obesity with serious comorbidity and body mass index (BMI) of 36.0 to 36.9 in adult, unspecified obesity type (HCC) Melissa Leach is currently in the action stage of change. As such, her goal is to continue with weight loss efforts. She has agreed to the Category 2 Plan with lunch options.   Melissa Leach was educated that her weight loss rate was almost perfect and she is doing well overall.  Exercise goals: As is, and add strengthening exercise.  Behavioral modification strategies: no skipping meals and better snacking choices.  Melissa Leach has agreed to  follow-up with our clinic in 2 to 3 weeks. She was informed of the importance of frequent follow-up visits to maximize her success with intensive lifestyle modifications for her multiple health conditions.   Objective:   Blood pressure 121/76, pulse 69, temperature 98.1 F (36.7 C), temperature source Oral, height 5\' 1"  (1.549 m), weight 194 lb (88 kg), last menstrual period 12/27/2002, SpO2 98 %. Body mass index is 36.66 kg/m.  General: Cooperative, alert, well developed, in no acute distress. HEENT: Conjunctivae and lids unremarkable. Cardiovascular: Regular rhythm.  Lungs: Normal work of breathing. Neurologic: No focal deficits.   Lab Results  Component Value Date   CREATININE 0.92 07/22/2019   BUN 18 07/22/2019   NA 134 07/22/2019   K 4.4 07/22/2019   CL 97 07/22/2019   CO2 24 07/22/2019   Lab Results  Component Value Date   ALT 18 07/22/2019   AST 24 07/22/2019   ALKPHOS 116 07/22/2019   BILITOT 0.5 07/22/2019   Lab Results  Component Value Date   HGBA1C 5.5 07/22/2019   HGBA1C 5.6 04/08/2019   Lab Results  Component Value Date   INSULIN 9.8 07/22/2019   INSULIN 12.1 04/08/2019   Lab Results  Component Value Date   TSH 0.923 07/22/2019   Lab Results  Component Value Date   CHOL 118 07/22/2019   HDL 47 07/22/2019   LDLCALC 49 07/22/2019   TRIG 124 07/22/2019   Lab Results  Component Value Date   WBC 7.5 04/08/2019   HGB 12.5 04/08/2019   HCT  37.6 04/08/2019   MCV 92 04/08/2019   PLT 309 04/08/2019   No results found for: IRON, TIBC, FERRITIN  Obesity Behavioral Intervention Documentation for Insurance:   Approximately 15 minutes were spent on the discussion below.  ASK: We discussed the diagnosis of obesity with Kailly today and Melissa Leach agreed to give [place] permission to discuss obesity behavioral modification therapy today.  ASSESS: Melissa Leach has the diagnosis of obesity and her BMI today is 36.67. Melissa Leach is in the action stage of change.    ADVISE: Melissa Leach was educated on the multiple health risks of obesity as well as the benefit of weight loss to improve her health. She was advised of the need for long term treatment and the importance of lifestyle modifications to improve her current health and to decrease her risk of future health problems.  AGREE: Multiple dietary modification options and treatment options were discussed and Melissa Leach agreed to follow the recommendations documented in the above note.  ARRANGE: Melissa Leach was educated on the importance of frequent visits to treat obesity as outlined per CMS and USPSTF guidelines and agreed to schedule her next follow up appointment today.  Attestation Statements:   Reviewed by clinician on day of visit: allergies, medications, problem list, medical history, surgical history, family history, social history, and previous encounter notes.   I, Trixie Dredge, am acting as transcriptionist for Dennard Nip, MD.  I have reviewed the above documentation for accuracy and completeness, and I agree with the above. -  Dennard Nip, MD

## 2019-09-08 DIAGNOSIS — M25561 Pain in right knee: Secondary | ICD-10-CM | POA: Diagnosis not present

## 2019-09-10 DIAGNOSIS — M25561 Pain in right knee: Secondary | ICD-10-CM | POA: Diagnosis not present

## 2019-09-20 DIAGNOSIS — M25561 Pain in right knee: Secondary | ICD-10-CM | POA: Diagnosis not present

## 2019-09-21 DIAGNOSIS — R69 Illness, unspecified: Secondary | ICD-10-CM | POA: Diagnosis not present

## 2019-09-22 ENCOUNTER — Encounter (INDEPENDENT_AMBULATORY_CARE_PROVIDER_SITE_OTHER): Payer: Self-pay | Admitting: Family Medicine

## 2019-09-22 ENCOUNTER — Other Ambulatory Visit: Payer: Self-pay

## 2019-09-22 ENCOUNTER — Ambulatory Visit (INDEPENDENT_AMBULATORY_CARE_PROVIDER_SITE_OTHER): Payer: Medicare HMO | Admitting: Family Medicine

## 2019-09-22 VITALS — BP 114/73 | HR 66 | Temp 97.9°F | Ht 61.0 in | Wt 192.0 lb

## 2019-09-22 DIAGNOSIS — R7303 Prediabetes: Secondary | ICD-10-CM

## 2019-09-22 DIAGNOSIS — E559 Vitamin D deficiency, unspecified: Secondary | ICD-10-CM

## 2019-09-22 DIAGNOSIS — Z6836 Body mass index (BMI) 36.0-36.9, adult: Secondary | ICD-10-CM

## 2019-09-22 DIAGNOSIS — F3289 Other specified depressive episodes: Secondary | ICD-10-CM

## 2019-09-22 DIAGNOSIS — R69 Illness, unspecified: Secondary | ICD-10-CM | POA: Diagnosis not present

## 2019-09-22 MED ORDER — METFORMIN HCL 500 MG PO TABS: 500.0000 mg | ORAL_TABLET | Freq: Every day | ORAL | 0 refills | Status: DC

## 2019-09-22 MED ORDER — TOPIRAMATE 25 MG PO TABS: 25.0000 mg | ORAL_TABLET | Freq: Every day | ORAL | 0 refills | Status: DC

## 2019-09-22 MED ORDER — VITAMIN D (ERGOCALCIFEROL) 1.25 MG (50000 UNIT) PO CAPS: ORAL_CAPSULE | ORAL | 0 refills | Status: DC

## 2019-09-23 DIAGNOSIS — M25561 Pain in right knee: Secondary | ICD-10-CM | POA: Diagnosis not present

## 2019-09-23 NOTE — Progress Notes (Signed)
Chief Complaint:   OBESITY Melissa Leach is here to discuss her progress with her obesity treatment plan along with follow-up of her obesity related diagnoses. Melissa Leach is on the Category 2 Plan with lunch options and states she is following her eating plan approximately 60% of the time. Melissa Leach states she is walking for 15 minutes 7 times per week.  Today's visit was #: 10 Starting weight: 194 lbs Starting date: 04/08/2019 Today's weight: 192 lbs Today's date: 09/22/2019 Total lbs lost to date: 2 Total lbs lost since last in-office visit: 2  Interim History: Melissa Leach was on vacation and she did well with portion control and smarter food choices. She is still losing weight but she is worried that she is hitting a plateau. She notes her protein intake is low.  Subjective:   1. Pre-diabetes Melissa Leach is stable on metformin, and she is working on diet and exercise.  2. Vitamin D deficiency Melissa Leach is stable on Vit D, and she denies nausea, vomiting, or muscle weakness.  3. Other depression with emotional eating  Melissa Leach notes increased carbohydrate cravings and some binge eating behaviors.  Assessment/Plan:   1. Pre-diabetes Melissa Leach will continue to work on weight loss, exercise, and decreasing simple carbohydrates to help decrease the risk of diabetes. we will refill metformin for 1 month.   - metFORMIN (GLUCOPHAGE) 500 MG tablet; Take 1 tablet (500 mg total) by mouth daily with breakfast.  Dispense: 30 tablet; Refill: 0  2. Vitamin D deficiency Low Vitamin D level contributes to fatigue and are associated with obesity, breast, and colon cancer. We will refill prescription Vitamin D for 1 month. Melissa Leach will follow-up for routine testing of Vitamin D, at least 2-3 times per year to avoid over-replacement.  - Vitamin D, Ergocalciferol, (DRISDOL) 1.25 MG (50000 UNIT) CAPS capsule; TAKE 1 CAPSULE BY MOUTH EVERY 7 DAYS  Dispense: 4 capsule; Refill: 0  3. Other depression with emotional  eating  Behavior modification techniques were discussed today to help Melissa Leach deal with her emotional/non-hunger eating behaviors. [name] agreed to start topiramate 25 mg daily with no refills. Orders and follow up as documented in patient record.   - topiramate (TOPAMAX) 25 MG tablet; Take 1 tablet (25 mg total) by mouth at bedtime.  Dispense: 30 tablet; Refill: 0  4. Class 2 severe obesity with serious comorbidity and body mass index (BMI) of 36.0 to 36.9 in adult, unspecified obesity type (Melissa Leach) Melissa Leach is currently in the action stage of change. As such, her goal is to continue with weight loss efforts. She has agreed to the Category 2 Plan.   Exercise goals: As is.  Behavioral modification strategies: meal planning and cooking strategies.  Melissa Leach has agreed to follow-up with our clinic in 2 to 3 weeks. She was informed of the importance of frequent follow-up visits to maximize her success with intensive lifestyle modifications for her multiple health conditions.   Objective:   Blood pressure 114/73, pulse 66, temperature 97.9 F (36.6 C), temperature source Oral, height 5\' 1"  (1.549 m), weight 192 lb (87.1 kg), last menstrual period 12/27/2002, SpO2 97 %. Body mass index is 36.28 kg/m.  General: Cooperative, alert, well developed, in no acute distress. HEENT: Conjunctivae and lids unremarkable. Cardiovascular: Regular rhythm.  Lungs: Normal work of breathing. Neurologic: No focal deficits.   Lab Results  Component Value Date   CREATININE 0.92 07/22/2019   BUN 18 07/22/2019   NA 134 07/22/2019   K 4.4 07/22/2019   CL 97 07/22/2019  CO2 24 07/22/2019   Lab Results  Component Value Date   ALT 18 07/22/2019   AST 24 07/22/2019   ALKPHOS 116 07/22/2019   BILITOT 0.5 07/22/2019   Lab Results  Component Value Date   HGBA1C 5.5 07/22/2019   HGBA1C 5.6 04/08/2019   Lab Results  Component Value Date   INSULIN 9.8 07/22/2019   INSULIN 12.1 04/08/2019   Lab Results    Component Value Date   TSH 0.923 07/22/2019   Lab Results  Component Value Date   CHOL 118 07/22/2019   HDL 47 07/22/2019   LDLCALC 49 07/22/2019   TRIG 124 07/22/2019   Lab Results  Component Value Date   WBC 7.5 04/08/2019   HGB 12.5 04/08/2019   HCT 37.6 04/08/2019   MCV 92 04/08/2019   PLT 309 04/08/2019   No results found for: IRON, TIBC, FERRITIN  Obesity Behavioral Intervention Documentation for Insurance:   Approximately 15 minutes were spent on the discussion below.  ASK: We discussed the diagnosis of obesity with [name] today and [name] agreed to give [place] permission to discuss obesity behavioral modification therapy today.  ASSESS: [name] has the diagnosis of obesity and her BMI today is 36.3. [name] is in the action stage of change.   ADVISE: [name] was educated on the multiple health risks of obesity as well as the benefit of weight loss to improve her health. She was advised of the need for long term treatment and the importance of lifestyle modifications to improve her current health and to decrease her risk of future health problems.  AGREE: Multiple dietary modification options and treatment options were discussed and [name] agreed to follow the recommendations documented in the above note.  ARRANGE: [name] was educated on the importance of frequent visits to treat obesity as outlined per CMS and USPSTF guidelines and agreed to schedule her next follow up appointment today.  Attestation Statements:   Reviewed by clinician on day of visit: allergies, medications, problem list, medical history, surgical history, family history, social history, and previous encounter notes.   I, Burt Knack, am acting as transcriptionist for Nicholas Lose, MD.  I have reviewed the above documentation for accuracy and completeness, and I agree with the above. -  Quillian Quince, MD

## 2019-09-30 DIAGNOSIS — R69 Illness, unspecified: Secondary | ICD-10-CM | POA: Diagnosis not present

## 2019-10-01 DIAGNOSIS — M25561 Pain in right knee: Secondary | ICD-10-CM | POA: Diagnosis not present

## 2019-10-02 ENCOUNTER — Other Ambulatory Visit (INDEPENDENT_AMBULATORY_CARE_PROVIDER_SITE_OTHER): Payer: Self-pay | Admitting: Family Medicine

## 2019-10-02 DIAGNOSIS — R7303 Prediabetes: Secondary | ICD-10-CM

## 2019-10-05 DIAGNOSIS — M25561 Pain in right knee: Secondary | ICD-10-CM | POA: Diagnosis not present

## 2019-10-06 DIAGNOSIS — R69 Illness, unspecified: Secondary | ICD-10-CM | POA: Diagnosis not present

## 2019-10-07 ENCOUNTER — Encounter (INDEPENDENT_AMBULATORY_CARE_PROVIDER_SITE_OTHER): Payer: Self-pay | Admitting: Family Medicine

## 2019-10-07 ENCOUNTER — Other Ambulatory Visit: Payer: Self-pay

## 2019-10-07 ENCOUNTER — Ambulatory Visit (INDEPENDENT_AMBULATORY_CARE_PROVIDER_SITE_OTHER): Payer: Medicare HMO | Admitting: Family Medicine

## 2019-10-07 VITALS — BP 94/65 | HR 68 | Temp 97.6°F | Ht 61.0 in | Wt 190.0 lb

## 2019-10-07 DIAGNOSIS — F3289 Other specified depressive episodes: Secondary | ICD-10-CM | POA: Diagnosis not present

## 2019-10-07 DIAGNOSIS — Z6836 Body mass index (BMI) 36.0-36.9, adult: Secondary | ICD-10-CM

## 2019-10-07 DIAGNOSIS — R69 Illness, unspecified: Secondary | ICD-10-CM | POA: Diagnosis not present

## 2019-10-07 DIAGNOSIS — E8881 Metabolic syndrome: Secondary | ICD-10-CM

## 2019-10-07 MED ORDER — TOPIRAMATE 50 MG PO TABS: 50.0000 mg | ORAL_TABLET | Freq: Every day | ORAL | 0 refills | Status: DC

## 2019-10-08 DIAGNOSIS — M25561 Pain in right knee: Secondary | ICD-10-CM | POA: Diagnosis not present

## 2019-10-12 ENCOUNTER — Other Ambulatory Visit (INDEPENDENT_AMBULATORY_CARE_PROVIDER_SITE_OTHER): Payer: Self-pay | Admitting: Family Medicine

## 2019-10-12 DIAGNOSIS — E559 Vitamin D deficiency, unspecified: Secondary | ICD-10-CM

## 2019-10-12 NOTE — Progress Notes (Signed)
Chief Complaint:   OBESITY Melissa Leach is here to discuss her progress with her obesity treatment plan along with follow-up of her obesity related diagnoses. Melissa Leach is on the Category 2 Plan and states she is following her eating plan approximately 60% of the time. Melissa Leach states she is walking the dog for 10-15 minutes 7 times per week.  Today's visit was #: 11 Starting weight: 194 lbs Starting date: 04/08/2019 Today's weight: 190 lbs Today's date: 10/07/2019 Total lbs lost to date: 4 Total lbs lost since last in-office visit: 2  Interim History: Melissa Leach notes cravings in the afternoon. She does eat out some but she makes smart choices. She is doing well on our plan and has lost 26 lbs since January of this year. She is going to [place], [place] next week.  Subjective:   1. Insulin resistance Melissa Leach has a diagnosis of insulin resistance based on her elevated fasting insulin level >5. She is on metformin, and she denies polyphagia. She continues to work on diet and exercise to decrease her risk of diabetes.  Lab Results  Component Value Date   INSULIN 9.8 07/22/2019   INSULIN 12.1 04/08/2019   Lab Results  Component Value Date   HGBA1C 5.5 07/22/2019   2. Other depression with emotional eating  Melissa Leach struggles with cravings in the late afternoon. She does tend to eat too much popcorn. Her mood is stable . She has not noticed a difference with addition of Topamax. She denies side effects.  Assessment/Plan:   1. Insulin resistance Melissa Leach will continue metformin, and will continue to work on weight loss, exercise, and decreasing simple carbohydrates to help decrease the risk of diabetes. Melissa Leach agreed to follow-up with [place] as directed to closely monitor her progress.  2. Other depression with emotional eating  Behavior modification techniques were discussed today to help Melissa Leach deal with her emotional/non-hunger eating behaviors. Melissa Leach agreed to increase Topamax 50 mg qhs  with no refills, and she will continue Zoloft and buspar. Orders and follow up as documented in patient record.   - topiramate (TOPAMAX) 50 MG tablet; Take 1 tablet (50 mg total) by mouth at bedtime.  Dispense: 30 tablet; Refill: 0  3. Class 2 severe obesity with serious comorbidity and body mass index (BMI) of 36.0 to 36.9 in adult, unspecified obesity type (HCC) Melissa Leach is currently in the action stage of change. As such, her goal is to continue with weight loss efforts. She has agreed to the Category 2 Plan.   Exercise goals: As is.  Behavioral modification strategies: decreasing simple carbohydrates and travel eating strategies.  Melissa Leach has agreed to follow-up with our clinic in 2 to 3 weeks. She was informed of the importance of frequent follow-up visits to maximize her success with intensive lifestyle modifications for her multiple health conditions.   Objective:   Blood pressure 94/65, pulse 68, temperature 97.6 F (36.4 C), temperature source Oral, height 5\' 1"  (1.549 m), weight 190 lb (86.2 kg), last menstrual period 12/27/2002, SpO2 98 %. Body mass index is 35.9 kg/m.  General: Cooperative, alert, well developed, in no acute distress. HEENT: Conjunctivae and lids unremarkable. Cardiovascular: Regular rhythm.  Lungs: Normal work of breathing. Neurologic: No focal deficits.   Lab Results  Component Value Date   CREATININE 0.92 07/22/2019   BUN 18 07/22/2019   NA 134 07/22/2019   K 4.4 07/22/2019   CL 97 07/22/2019   CO2 24 07/22/2019   Lab Results  Component Value Date  ALT 18 07/22/2019   AST 24 07/22/2019   ALKPHOS 116 07/22/2019   BILITOT 0.5 07/22/2019   Lab Results  Component Value Date   HGBA1C 5.5 07/22/2019   HGBA1C 5.6 04/08/2019   Lab Results  Component Value Date   INSULIN 9.8 07/22/2019   INSULIN 12.1 04/08/2019   Lab Results  Component Value Date   TSH 0.923 07/22/2019   Lab Results  Component Value Date   CHOL 118 07/22/2019   HDL 47  07/22/2019   LDLCALC 49 07/22/2019   TRIG 124 07/22/2019   Lab Results  Component Value Date   WBC 7.5 04/08/2019   HGB 12.5 04/08/2019   HCT 37.6 04/08/2019   MCV 92 04/08/2019   PLT 309 04/08/2019   No results found for: IRON, TIBC, FERRITIN  Obesity Behavioral Intervention Documentation for Insurance:   Approximately 15 minutes were spent on the discussion below.  ASK: We discussed the diagnosis of obesity with Melissa Leach today and Melissa Leach agreed to give [place] permission to discuss obesity behavioral modification therapy today.  ASSESS: Melissa Leach has the diagnosis of obesity and her BMI today is 35.92. Melissa Leach is in the action stage of change.   ADVISE: Melissa Leach was educated on the multiple health risks of obesity as well as the benefit of weight loss to improve her health. She was advised of the need for long term treatment and the importance of lifestyle modifications to improve her current health and to decrease her risk of future health problems.  AGREE: Multiple dietary modification options and treatment options were discussed and Melissa Leach agreed to follow the recommendations documented in the above note.  ARRANGE: Melissa Leach was educated on the importance of frequent visits to treat obesity as outlined per CMS and USPSTF guidelines and agreed to schedule her next follow up appointment today.  Attestation Statements:   Reviewed by clinician on day of visit: allergies, medications, problem list, medical history, surgical history, family history, social history, and previous encounter notes.   Trude Mcburney, am acting as Energy manager for [place], FNP-C.  I have reviewed the above documentation for accuracy and completeness, and I agree with the above. -  Jesse Sans, FNP

## 2019-10-13 ENCOUNTER — Encounter (INDEPENDENT_AMBULATORY_CARE_PROVIDER_SITE_OTHER): Payer: Self-pay | Admitting: Family Medicine

## 2019-10-13 DIAGNOSIS — E8881 Metabolic syndrome: Secondary | ICD-10-CM | POA: Insufficient documentation

## 2019-10-13 DIAGNOSIS — E88819 Insulin resistance, unspecified: Secondary | ICD-10-CM | POA: Insufficient documentation

## 2019-10-14 ENCOUNTER — Other Ambulatory Visit (INDEPENDENT_AMBULATORY_CARE_PROVIDER_SITE_OTHER): Payer: Self-pay | Admitting: Family Medicine

## 2019-10-14 DIAGNOSIS — F3289 Other specified depressive episodes: Secondary | ICD-10-CM

## 2019-10-18 DIAGNOSIS — M25561 Pain in right knee: Secondary | ICD-10-CM | POA: Diagnosis not present

## 2019-10-22 DIAGNOSIS — L570 Actinic keratosis: Secondary | ICD-10-CM | POA: Diagnosis not present

## 2019-10-22 DIAGNOSIS — L711 Rhinophyma: Secondary | ICD-10-CM | POA: Diagnosis not present

## 2019-10-22 DIAGNOSIS — L718 Other rosacea: Secondary | ICD-10-CM | POA: Diagnosis not present

## 2019-10-27 DIAGNOSIS — M25561 Pain in right knee: Secondary | ICD-10-CM | POA: Diagnosis not present

## 2019-10-28 ENCOUNTER — Other Ambulatory Visit (INDEPENDENT_AMBULATORY_CARE_PROVIDER_SITE_OTHER): Payer: Self-pay | Admitting: Family Medicine

## 2019-10-28 DIAGNOSIS — R7303 Prediabetes: Secondary | ICD-10-CM

## 2019-10-29 DIAGNOSIS — M25561 Pain in right knee: Secondary | ICD-10-CM | POA: Diagnosis not present

## 2019-11-01 ENCOUNTER — Other Ambulatory Visit: Payer: Self-pay

## 2019-11-01 ENCOUNTER — Encounter (INDEPENDENT_AMBULATORY_CARE_PROVIDER_SITE_OTHER): Payer: Self-pay | Admitting: Family Medicine

## 2019-11-01 ENCOUNTER — Ambulatory Visit (INDEPENDENT_AMBULATORY_CARE_PROVIDER_SITE_OTHER): Payer: Medicare HMO | Admitting: Family Medicine

## 2019-11-01 VITALS — BP 102/72 | HR 72 | Temp 97.7°F | Ht 61.0 in | Wt 189.0 lb

## 2019-11-01 DIAGNOSIS — E038 Other specified hypothyroidism: Secondary | ICD-10-CM | POA: Diagnosis not present

## 2019-11-01 DIAGNOSIS — Z6835 Body mass index (BMI) 35.0-35.9, adult: Secondary | ICD-10-CM

## 2019-11-01 DIAGNOSIS — E559 Vitamin D deficiency, unspecified: Secondary | ICD-10-CM | POA: Diagnosis not present

## 2019-11-01 DIAGNOSIS — F3289 Other specified depressive episodes: Secondary | ICD-10-CM

## 2019-11-01 DIAGNOSIS — E8881 Metabolic syndrome: Secondary | ICD-10-CM | POA: Diagnosis not present

## 2019-11-01 DIAGNOSIS — E7849 Other hyperlipidemia: Secondary | ICD-10-CM | POA: Diagnosis not present

## 2019-11-01 DIAGNOSIS — R69 Illness, unspecified: Secondary | ICD-10-CM | POA: Diagnosis not present

## 2019-11-01 DIAGNOSIS — M25561 Pain in right knee: Secondary | ICD-10-CM | POA: Diagnosis not present

## 2019-11-01 MED ORDER — METFORMIN HCL 500 MG PO TABS: 500.0000 mg | ORAL_TABLET | Freq: Every day | ORAL | 0 refills | Status: DC

## 2019-11-01 MED ORDER — VITAMIN D (ERGOCALCIFEROL) 1.25 MG (50000 UNIT) PO CAPS: ORAL_CAPSULE | ORAL | 0 refills | Status: DC

## 2019-11-01 NOTE — Progress Notes (Signed)
Chief Complaint:   OBESITY Melissa Leach is here to discuss her progress with her obesity treatment plan along with follow-up of her obesity related diagnoses. Melissa Leach is on the Category 2 Plan and states she is following her eating plan approximately 60% of the time. Melissa Leach states she is walking the dog for 30 minutes 7 times per week.  Today's visit was #: 12 Starting weight: 194 lbs Starting date: 04/08/2019 Today's weight: 189 lbs Today's date: 11/01/2019 Total lbs lost to date: 5 Total lbs lost since last in-office visit: 1  Interim History: Melissa Leach has done some traveling and she had to eat out more, but she did well staying mindful of her food choices. She is back home and is doing well although she still struggles to eat all of her protein.  Subjective:   1. Vitamin D deficiency Melissa Leach's last Vit D level was at goal. She is at risk of over-replacement.  2. Insulin resistance Melissa Leach is doing well on metformin and diet. She denies nausea or vomiting.  3. Other hyperlipidemia Melissa Leach is doing well on her diet, and she is due for labs.  4. Other specified hypothyroidism Melissa Leach is stable on levothyroxine, and she notes some increased hair loss.  5. Other depression, with emotional eating Melissa Leach is stable on Topamax, and she requests a refill today.  Assessment/Plan:   1. Vitamin D deficiency Low Vitamin D level contributes to fatigue and are associated with obesity, breast, and colon cancer. We will check labs today, and we will refill prescription Vitamin D for 1 month. Marcelia will follow-up for routine testing of Vitamin D, at least 2-3 times per year to avoid over-replacement.  - VITAMIN D 25 Hydroxy (Vit-D Deficiency, Fractures) - Vitamin D, Ergocalciferol, (DRISDOL) 1.25 MG (50000 UNIT) CAPS capsule; TAKE 1 CAPSULE BY MOUTH EVERY 7 DAYS  Dispense: 4 capsule; Refill: 0  2. Insulin resistance Melissa Leach will continue to work on weight loss, exercise, and decreasing  simple carbohydrates to help decrease the risk of diabetes. we will check labs today, and we will refill metformin for 1 month. Melissa Leach agreed to follow-up with [place] as directed to closely monitor her progress.  - Hemoglobin A1c - Insulin, random - metFORMIN (GLUCOPHAGE) 500 MG tablet; Take 1 tablet (500 mg total) by mouth daily with breakfast.  Dispense: 30 tablet; Refill: 0  3. Other hyperlipidemia Cardiovascular risk and specific lipid/LDL goals reviewed. We discussed several lifestyle modifications today. Melissa Leach will continue to work on diet, exercise and weight loss efforts. We will check labs today. Orders and follow up as documented in patient record.   - Comprehensive metabolic panel - Lipid Panel With LDL/HDL Ratio  4. Other specified hypothyroidism Patient with long-standing hypothyroidism, on levothyroxine therapy. Melissa Leach appears euthyroid. We will check labs today. Orders and follow up as documented in patient record.  - T3 - T4, free - TSH  5. Other depression, with emotional eating Behavior modification techniques were discussed today to help Melissa Leach deal with her emotional/non-hunger eating behaviors. We will refill Topamax 50 mg qhs #30 and we will refill for 1 month. Orders and follow up as documented in patient record.   6. Class 2 severe obesity with serious comorbidity and body mass index (BMI) of 35.0 to 35.9 in adult, unspecified obesity type (HCC) Melissa Leach is currently in the action stage of change. As such, her goal is to continue with weight loss efforts. She has agreed to the Category 2 Plan.   Exercise goals: As is.  Behavioral modification strategies: increasing lean protein intake.  Melissa Leach has agreed to follow-up with our clinic in 3 weeks. She was informed of the importance of frequent follow-up visits to maximize her success with intensive lifestyle modifications for her multiple health conditions.   Melissa Leach was informed we would discuss her lab results  at her next visit unless there is a critical issue that needs to be addressed sooner. Melissa Leach agreed to keep her next visit at the agreed upon time to discuss these results.  Objective:   Blood pressure 102/72, pulse 72, temperature 97.7 F (36.5 C), temperature source Oral, height 5\' 1"  (1.549 m), weight 189 lb (85.7 kg), last menstrual period 12/27/2002, SpO2 97 %. Body mass index is 35.71 kg/m.  General: Cooperative, alert, well developed, in no acute distress. HEENT: Conjunctivae and lids unremarkable. Cardiovascular: Regular rhythm.  Lungs: Normal work of breathing. Neurologic: No focal deficits.   Lab Results  Component Value Date   CREATININE 0.92 07/22/2019   BUN 18 07/22/2019   NA 134 07/22/2019   K 4.4 07/22/2019   CL 97 07/22/2019   CO2 24 07/22/2019   Lab Results  Component Value Date   ALT 18 07/22/2019   AST 24 07/22/2019   ALKPHOS 116 07/22/2019   BILITOT 0.5 07/22/2019   Lab Results  Component Value Date   HGBA1C 5.5 07/22/2019   HGBA1C 5.6 04/08/2019   Lab Results  Component Value Date   INSULIN 9.8 07/22/2019   INSULIN 12.1 04/08/2019   Lab Results  Component Value Date   TSH 0.923 07/22/2019   Lab Results  Component Value Date   CHOL 118 07/22/2019   HDL 47 07/22/2019   LDLCALC 49 07/22/2019   TRIG 124 07/22/2019   Lab Results  Component Value Date   WBC 7.5 04/08/2019   HGB 12.5 04/08/2019   HCT 37.6 04/08/2019   MCV 92 04/08/2019   PLT 309 04/08/2019   No results found for: IRON, TIBC, FERRITIN  Obesity Behavioral Intervention Documentation for Insurance:   Approximately 15 minutes were spent on the discussion below.  ASK: We discussed the diagnosis of obesity with Melissa Leach today and Melissa Leach agreed to give Melissa Leach permission to discuss obesity behavioral modification therapy today.  ASSESS: Melissa Leach has the diagnosis of obesity and her BMI today is 35.73. Melissa Leach is in the action stage of change.   ADVISE: Melissa Leach was educated on  the multiple health risks of obesity as well as the benefit of weight loss to improve her health. She was advised of the need for long term treatment and the importance of lifestyle modifications to improve her current health and to decrease her risk of future health problems.  AGREE: Multiple dietary modification options and treatment options were discussed and Melissa Leach agreed to follow the recommendations documented in the above note.  ARRANGE: Melissa Leach was educated on the importance of frequent visits to treat obesity as outlined per CMS and USPSTF guidelines and agreed to schedule her next follow up appointment today.  Attestation Statements:   Reviewed by clinician on day of visit: allergies, medications, problem list, medical history, surgical history, family history, social history, and previous encounter notes.   I, Melissa Leach, am acting as transcriptionist for Burt Knack, MD.  I have reviewed the above documentation for accuracy and completeness, and I agree with the above. -  Quillian Quince, MD

## 2019-11-02 LAB — LIPID PANEL WITH LDL/HDL RATIO
Cholesterol, Total: 186 mg/dL (ref 100–199)
HDL: 41 mg/dL (ref >39)
LDL Chol Calc (NIH): 121 mg/dL — ABNORMAL HIGH (ref 0–99)
LDL/HDL Ratio: 3 ratio (ref 0.0–3.2)
Triglycerides: 134 mg/dL (ref 0–149)
VLDL Cholesterol Cal: 24 mg/dL (ref 5–40)

## 2019-11-02 LAB — TSH: TSH: 0.692 u[IU]/mL (ref 0.450–4.500)

## 2019-11-02 LAB — COMPREHENSIVE METABOLIC PANEL
ALT: 13 IU/L (ref 0–32)
AST: 18 IU/L (ref 0–40)
Albumin/Globulin Ratio: 1.5 (ref 1.2–2.2)
Albumin: 4.1 g/dL (ref 3.8–4.8)
Alkaline Phosphatase: 105 IU/L (ref 48–121)
BUN/Creatinine Ratio: 24 (ref 12–28)
BUN: 22 mg/dL (ref 8–27)
Bilirubin Total: 0.3 mg/dL (ref 0.0–1.2)
CO2: 20 mmol/L (ref 20–29)
Calcium: 9.6 mg/dL (ref 8.7–10.3)
Chloride: 104 mmol/L (ref 96–106)
Creatinine, Ser: 0.9 mg/dL (ref 0.57–1.00)
GFR calc Af Amer: 77 mL/min/{1.73_m2} (ref >59)
GFR calc non Af Amer: 66 mL/min/{1.73_m2} (ref >59)
Globulin, Total: 2.7 g/dL (ref 1.5–4.5)
Glucose: 95 mg/dL (ref 65–99)
Potassium: 4.8 mmol/L (ref 3.5–5.2)
Sodium: 139 mmol/L (ref 134–144)
Total Protein: 6.8 g/dL (ref 6.0–8.5)

## 2019-11-02 LAB — VITAMIN D 25 HYDROXY (VIT D DEFICIENCY, FRACTURES): Vit D, 25-Hydroxy: 60.7 ng/mL (ref 30.0–100.0)

## 2019-11-02 LAB — INSULIN, RANDOM: INSULIN: 8.3 u[IU]/mL (ref 2.6–24.9)

## 2019-11-02 LAB — T4, FREE: Free T4: 1.46 ng/dL (ref 0.82–1.77)

## 2019-11-02 LAB — T3: T3, Total: 65 ng/dL — ABNORMAL LOW (ref 71–180)

## 2019-11-02 LAB — HEMOGLOBIN A1C
Est. average glucose Bld gHb Est-mCnc: 111 mg/dL
Hgb A1c MFr Bld: 5.5 % (ref 4.8–5.6)

## 2019-11-04 DIAGNOSIS — M25561 Pain in right knee: Secondary | ICD-10-CM | POA: Diagnosis not present

## 2019-11-08 ENCOUNTER — Other Ambulatory Visit (INDEPENDENT_AMBULATORY_CARE_PROVIDER_SITE_OTHER): Payer: Self-pay | Admitting: Family Medicine

## 2019-11-08 DIAGNOSIS — F3289 Other specified depressive episodes: Secondary | ICD-10-CM

## 2019-11-12 DIAGNOSIS — M25561 Pain in right knee: Secondary | ICD-10-CM | POA: Diagnosis not present

## 2019-11-16 DIAGNOSIS — M25561 Pain in right knee: Secondary | ICD-10-CM | POA: Diagnosis not present

## 2019-11-18 DIAGNOSIS — M25561 Pain in right knee: Secondary | ICD-10-CM | POA: Diagnosis not present

## 2019-11-21 ENCOUNTER — Other Ambulatory Visit (INDEPENDENT_AMBULATORY_CARE_PROVIDER_SITE_OTHER): Payer: Self-pay | Admitting: Family Medicine

## 2019-11-21 DIAGNOSIS — E559 Vitamin D deficiency, unspecified: Secondary | ICD-10-CM

## 2019-11-22 ENCOUNTER — Ambulatory Visit (INDEPENDENT_AMBULATORY_CARE_PROVIDER_SITE_OTHER): Payer: Medicare HMO | Admitting: Family Medicine

## 2019-11-24 ENCOUNTER — Other Ambulatory Visit (INDEPENDENT_AMBULATORY_CARE_PROVIDER_SITE_OTHER): Payer: Self-pay | Admitting: Family Medicine

## 2019-11-24 ENCOUNTER — Encounter (INDEPENDENT_AMBULATORY_CARE_PROVIDER_SITE_OTHER): Payer: Self-pay

## 2019-11-24 DIAGNOSIS — R69 Illness, unspecified: Secondary | ICD-10-CM | POA: Diagnosis not present

## 2019-11-24 DIAGNOSIS — E8881 Metabolic syndrome: Secondary | ICD-10-CM

## 2019-11-24 NOTE — Telephone Encounter (Signed)
My chart message sent to pt.

## 2019-11-25 DIAGNOSIS — M25561 Pain in right knee: Secondary | ICD-10-CM | POA: Diagnosis not present

## 2019-11-30 ENCOUNTER — Ambulatory Visit (INDEPENDENT_AMBULATORY_CARE_PROVIDER_SITE_OTHER): Payer: Medicare HMO | Admitting: Family Medicine

## 2019-11-30 ENCOUNTER — Encounter (INDEPENDENT_AMBULATORY_CARE_PROVIDER_SITE_OTHER): Payer: Self-pay | Admitting: Family Medicine

## 2019-11-30 ENCOUNTER — Other Ambulatory Visit: Payer: Self-pay

## 2019-11-30 VITALS — BP 115/76 | HR 69 | Temp 97.6°F | Ht 61.0 in | Wt 184.0 lb

## 2019-11-30 DIAGNOSIS — F3289 Other specified depressive episodes: Secondary | ICD-10-CM | POA: Diagnosis not present

## 2019-11-30 DIAGNOSIS — Z6834 Body mass index (BMI) 34.0-34.9, adult: Secondary | ICD-10-CM | POA: Diagnosis not present

## 2019-11-30 DIAGNOSIS — E669 Obesity, unspecified: Secondary | ICD-10-CM

## 2019-11-30 DIAGNOSIS — R69 Illness, unspecified: Secondary | ICD-10-CM | POA: Diagnosis not present

## 2019-11-30 MED ORDER — TOPIRAMATE 50 MG PO TABS: ORAL_TABLET | ORAL | 0 refills | Status: DC

## 2019-11-30 NOTE — Progress Notes (Signed)
Chief Complaint:   OBESITY  Melissa Leach is here to discuss her progress with her obesity treatment plan along with follow-up of her obesity related diagnoses. Melissa Leach is on the Category 2 Plan and states she is following her eating plan approximately 60% of the time. Melissa Leach states she is walking the dog for 15-30 minutes 7 times per week, and physical therapy for 60 minutes 2 times per week.  Today's visit was #: 13 Starting weight: 194 lbs Starting date: 04/08/2019 Today's weight: 184 lbs Today's date: 11/30/2019 Total lbs lost to date: 10 Total lbs lost since last in-office visit: 5  Interim History: Melissa Leach continues to do well with weight loss on her Category 2 plan. Her hunger is mostly controlled and she is doing well with meal planning. Her dog will have knee surgery soon and her activity will decrease due to no dog walking and having to stay home more.  Subjective:   1. Other depression with emotional eating  Melissa Leach is stable on her medications, and notes decreased emotional eating on medications.   Assessment/Plan:   1. Other depression with emotional eating  Emotional eating strategies were discussed today to help Melissa Leach deal with her emotional/non-hunger eating behaviors. We will refill topiramate for 1 month. Orders and follow up as documented in patient record.   - topiramate (TOPAMAX) 50 MG tablet; TAKE 1 TABLET BY MOUTH EVERYDAY AT BEDTIME  Dispense: 30 tablet; Refill: 0  2. Class 1 obesity with serious comorbidity and body mass index (BMI) of 34.0 to 34.9 in adult, unspecified obesity type Melissa Leach is currently in the action stage of change. As such, her goal is to continue with weight loss efforts. She has agreed to the Category 2 Plan.   Exercise goals: All adults should avoid inactivity. Some physical activity is better than none, and adults who participate in any amount of physical activity gain some health benefits.  Behavioral modification strategies: meal  planning and cooking strategies and ways to avoid boredom eating.  Melissa Leach has agreed to follow-up with our clinic in 3 weeks. She was informed of the importance of frequent follow-up visits to maximize her success with intensive lifestyle modifications for her multiple health conditions.   Objective:   Blood pressure 115/76, pulse 69, temperature 97.6 F (36.4 C), height 5\' 1"  (1.549 m), weight 184 lb (83.5 kg), last menstrual period 12/27/2002, SpO2 99 %. Body mass index is 34.77 kg/m.  General: Cooperative, alert, well developed, in no acute distress. HEENT: Conjunctivae and lids unremarkable. Cardiovascular: Regular rhythm.  Lungs: Normal work of breathing. Neurologic: No focal deficits.   Lab Results  Component Value Date   CREATININE 0.90 11/01/2019   BUN 22 11/01/2019   NA 139 11/01/2019   K 4.8 11/01/2019   CL 104 11/01/2019   CO2 20 11/01/2019   Lab Results  Component Value Date   ALT 13 11/01/2019   AST 18 11/01/2019   ALKPHOS 105 11/01/2019   BILITOT 0.3 11/01/2019   Lab Results  Component Value Date   HGBA1C 5.5 11/01/2019   HGBA1C 5.5 07/22/2019   HGBA1C 5.6 04/08/2019   Lab Results  Component Value Date   INSULIN 8.3 11/01/2019   INSULIN 9.8 07/22/2019   INSULIN 12.1 04/08/2019   Lab Results  Component Value Date   TSH 0.692 11/01/2019   Lab Results  Component Value Date   CHOL 186 11/01/2019   HDL 41 11/01/2019   LDLCALC 121 (H) 11/01/2019   TRIG 134 11/01/2019  Lab Results  Component Value Date   WBC 7.5 04/08/2019   HGB 12.5 04/08/2019   HCT 37.6 04/08/2019   MCV 92 04/08/2019   PLT 309 04/08/2019   No results found for: IRON, TIBC, FERRITIN  Attestation Statements:   Reviewed by clinician on day of visit: allergies, medications, problem list, medical history, surgical history, family history, social history, and previous encounter notes.   I, Burt Knack, am acting as transcriptionist for Quillian Quince, MD.  I have reviewed  the above documentation for accuracy and completeness, and I agree with the above. -  Quillian Quince, MD

## 2019-12-01 ENCOUNTER — Other Ambulatory Visit (INDEPENDENT_AMBULATORY_CARE_PROVIDER_SITE_OTHER): Payer: Self-pay | Admitting: Family Medicine

## 2019-12-01 DIAGNOSIS — F3289 Other specified depressive episodes: Secondary | ICD-10-CM

## 2019-12-12 ENCOUNTER — Other Ambulatory Visit (INDEPENDENT_AMBULATORY_CARE_PROVIDER_SITE_OTHER): Payer: Self-pay | Admitting: Family Medicine

## 2019-12-12 DIAGNOSIS — E8881 Metabolic syndrome: Secondary | ICD-10-CM

## 2019-12-12 DIAGNOSIS — E559 Vitamin D deficiency, unspecified: Secondary | ICD-10-CM

## 2019-12-13 ENCOUNTER — Encounter (INDEPENDENT_AMBULATORY_CARE_PROVIDER_SITE_OTHER): Payer: Self-pay | Admitting: Family Medicine

## 2019-12-13 NOTE — Telephone Encounter (Signed)
Refill protocol sent to Dr Beasley 

## 2019-12-21 ENCOUNTER — Other Ambulatory Visit: Payer: Self-pay

## 2019-12-21 ENCOUNTER — Encounter (INDEPENDENT_AMBULATORY_CARE_PROVIDER_SITE_OTHER): Payer: Self-pay | Admitting: Family Medicine

## 2019-12-21 ENCOUNTER — Ambulatory Visit (INDEPENDENT_AMBULATORY_CARE_PROVIDER_SITE_OTHER): Payer: Medicare HMO | Admitting: Family Medicine

## 2019-12-21 VITALS — BP 106/74 | HR 78 | Temp 97.6°F | Ht 61.0 in | Wt 183.0 lb

## 2019-12-21 DIAGNOSIS — F3289 Other specified depressive episodes: Secondary | ICD-10-CM | POA: Diagnosis not present

## 2019-12-21 DIAGNOSIS — R69 Illness, unspecified: Secondary | ICD-10-CM | POA: Diagnosis not present

## 2019-12-21 DIAGNOSIS — E8881 Metabolic syndrome: Secondary | ICD-10-CM

## 2019-12-21 DIAGNOSIS — E559 Vitamin D deficiency, unspecified: Secondary | ICD-10-CM | POA: Diagnosis not present

## 2019-12-21 DIAGNOSIS — E669 Obesity, unspecified: Secondary | ICD-10-CM

## 2019-12-21 DIAGNOSIS — Z6834 Body mass index (BMI) 34.0-34.9, adult: Secondary | ICD-10-CM | POA: Diagnosis not present

## 2019-12-21 MED ORDER — METFORMIN HCL 500 MG PO TABS: ORAL_TABLET | ORAL | 0 refills | Status: DC

## 2019-12-21 MED ORDER — TOPIRAMATE 50 MG PO TABS: ORAL_TABLET | ORAL | 0 refills | Status: DC

## 2019-12-21 MED ORDER — VITAMIN D (ERGOCALCIFEROL) 1.25 MG (50000 UNIT) PO CAPS: ORAL_CAPSULE | ORAL | 0 refills | Status: DC

## 2019-12-22 NOTE — Progress Notes (Signed)
Chief Complaint:   OBESITY Marijayne is here to discuss her progress with her obesity treatment plan along with follow-up of her obesity related diagnoses. Kaylana is on the Category 2 Plan and states she is following her eating plan approximately 60% of the time. Billy states she is doing hip and leg exercises for 10 minutes 5 times per week.  Today's visit was #: 14 Starting weight: 194 lbs Starting date: 04/08/2019 Today's weight: 183 lbs Today's date: 12/21/2019 Total lbs lost to date: 11 Total lbs lost since last in-office visit: 1  Interim History: Alveena continues to do well with weight loss but she is getting bored with her plan, and she is starting to skip meals. She is struggling with meal planning and would like to discuss more options.  Subjective:   1. Insulin resistance Caitlynne notes increased GI upset and increased simple carbohydrates. She is struggling to eat all her protein.  2. Vitamin D deficiency Miaa is stable on Vit D, but her level is not yet at goal. She denies nausea or vomiting.  3. Other depression with emotional eating  Faustina is doing well on Topamax, but she is still struggling with some carbohydrate cravings but still able to lose weight.  Assessment/Plan:   1. Insulin resistance Vika will continue to work on weight loss, diet, and exercise. She was encouraged to take metformin with food and decrease simple carbohydrates, as this can increase nausea. We will refill metformin for 1 month. Anijah agreed to follow-up with [place] as directed to closely monitor her progress.  - metFORMIN (GLUCOPHAGE) 500 MG tablet; TAKE 1 TABLET BY MOUTH EVERY DAY WITH BREAKFAST  Dispense: 30 tablet; Refill: 0  2. Vitamin D deficiency Low Vitamin D level contributes to fatigue and are associated with obesity, breast, and colon cancer. We will refill prescription Vitamin D for 1 month. Rhia will follow-up for routine testing of Vitamin D, at least 2-3 times per  year to avoid over-replacement.  - Vitamin D, Ergocalciferol, (DRISDOL) 1.25 MG (50000 UNIT) CAPS capsule; TAKE 1 CAPSULE BY MOUTH ONE TIME PER WEEK  Dispense: 4 capsule; Refill: 0  3. Other depression with emotional eating  Behavior modification techniques were discussed today to help Nida deal with her emotional/non-hunger eating behaviors. We will refill Topamax for 1 month. Orders and follow up as documented in patient record.   - topiramate (TOPAMAX) 50 MG tablet; TAKE 1 TABLET BY MOUTH EVERYDAY AT BEDTIME  Dispense: 30 tablet; Refill: 0  4. Class 1 obesity with serious comorbidity and body mass index (BMI) of 34.0 to 34.9 in adult, unspecified obesity type Mairi is currently in the action stage of change. As such, her goal is to continue with weight loss efforts. She has agreed to the Category 2 Plan and keeping a food journal and adhering to recommended goals of 400-550 calories and 40+ grams of protein at supper daily.   Exercise goals: As is.  Behavioral modification strategies: increasing lean protein intake and meal planning and cooking strategies.  Jarelyn has agreed to follow-up with our clinic in 3 to 4 weeks. She was informed of the importance of frequent follow-up visits to maximize her success with intensive lifestyle modifications for her multiple health conditions.   Objective:   Blood pressure 106/74, pulse 78, temperature 97.6 F (36.4 C), height 5\' 1"  (1.549 m), weight 183 lb (83 kg), last menstrual period 12/27/2002, SpO2 94 %. Body mass index is 34.58 kg/m.  General: Cooperative, alert, well developed,  in no acute distress. HEENT: Conjunctivae and lids unremarkable. Cardiovascular: Regular rhythm.  Lungs: Normal work of breathing. Neurologic: No focal deficits.   Lab Results  Component Value Date   CREATININE 0.90 11/01/2019   BUN 22 11/01/2019   NA 139 11/01/2019   K 4.8 11/01/2019   CL 104 11/01/2019   CO2 20 11/01/2019   Lab Results  Component  Value Date   ALT 13 11/01/2019   AST 18 11/01/2019   ALKPHOS 105 11/01/2019   BILITOT 0.3 11/01/2019   Lab Results  Component Value Date   HGBA1C 5.5 11/01/2019   HGBA1C 5.5 07/22/2019   HGBA1C 5.6 04/08/2019   Lab Results  Component Value Date   INSULIN 8.3 11/01/2019   INSULIN 9.8 07/22/2019   INSULIN 12.1 04/08/2019   Lab Results  Component Value Date   TSH 0.692 11/01/2019   Lab Results  Component Value Date   CHOL 186 11/01/2019   HDL 41 11/01/2019   LDLCALC 121 (H) 11/01/2019   TRIG 134 11/01/2019   Lab Results  Component Value Date   WBC 7.5 04/08/2019   HGB 12.5 04/08/2019   HCT 37.6 04/08/2019   MCV 92 04/08/2019   PLT 309 04/08/2019   No results found for: IRON, TIBC, FERRITIN  Obesity Behavioral Intervention:   Approximately 15 minutes were spent on the discussion below.  ASK: We discussed the diagnosis of obesity with Lucita today and Jeniece agreed to give [place] permission to discuss obesity behavioral modification therapy today.  ASSESS: Leighana has the diagnosis of obesity and her BMI today is 34.6. Freedom is in the action stage of change.   ADVISE: Jairy was educated on the multiple health risks of obesity as well as the benefit of weight loss to improve her health. She was advised of the need for long term treatment and the importance of lifestyle modifications to improve her current health and to decrease her risk of future health problems.  AGREE: Multiple dietary modification options and treatment options were discussed and Aisling agreed to follow the recommendations documented in the above note.  ARRANGE: Secilia was educated on the importance of frequent visits to treat obesity as outlined per CMS and USPSTF guidelines and agreed to schedule her next follow up appointment today.  Attestation Statements:   Reviewed by clinician on day of visit: allergies, medications, problem list, medical history, surgical history, family history,  social history, and previous encounter notes.   I, Burt Knack, am acting as transcriptionist for Quillian Quince, MD.  I have reviewed the above documentation for accuracy and completeness, and I agree with the above. -  Quillian Quince, MD

## 2019-12-29 ENCOUNTER — Other Ambulatory Visit (INDEPENDENT_AMBULATORY_CARE_PROVIDER_SITE_OTHER): Payer: Self-pay | Admitting: Family Medicine

## 2019-12-29 DIAGNOSIS — F3289 Other specified depressive episodes: Secondary | ICD-10-CM

## 2019-12-30 DIAGNOSIS — R69 Illness, unspecified: Secondary | ICD-10-CM | POA: Diagnosis not present

## 2020-01-03 ENCOUNTER — Other Ambulatory Visit (INDEPENDENT_AMBULATORY_CARE_PROVIDER_SITE_OTHER): Payer: Self-pay | Admitting: Family Medicine

## 2020-01-03 DIAGNOSIS — E559 Vitamin D deficiency, unspecified: Secondary | ICD-10-CM

## 2020-01-05 ENCOUNTER — Encounter (INDEPENDENT_AMBULATORY_CARE_PROVIDER_SITE_OTHER): Payer: Self-pay

## 2020-01-05 ENCOUNTER — Encounter (INDEPENDENT_AMBULATORY_CARE_PROVIDER_SITE_OTHER): Payer: Self-pay | Admitting: Family Medicine

## 2020-01-05 ENCOUNTER — Other Ambulatory Visit (INDEPENDENT_AMBULATORY_CARE_PROVIDER_SITE_OTHER): Payer: Self-pay | Admitting: Family Medicine

## 2020-01-05 DIAGNOSIS — E8881 Metabolic syndrome: Secondary | ICD-10-CM

## 2020-01-17 ENCOUNTER — Ambulatory Visit (INDEPENDENT_AMBULATORY_CARE_PROVIDER_SITE_OTHER): Payer: Medicare HMO | Admitting: Family Medicine

## 2020-01-17 ENCOUNTER — Other Ambulatory Visit: Payer: Self-pay

## 2020-01-17 ENCOUNTER — Encounter (INDEPENDENT_AMBULATORY_CARE_PROVIDER_SITE_OTHER): Payer: Self-pay | Admitting: Family Medicine

## 2020-01-17 VITALS — BP 107/66 | HR 77 | Temp 97.9°F | Ht 61.0 in | Wt 183.0 lb

## 2020-01-17 DIAGNOSIS — Z6834 Body mass index (BMI) 34.0-34.9, adult: Secondary | ICD-10-CM

## 2020-01-17 DIAGNOSIS — G4709 Other insomnia: Secondary | ICD-10-CM | POA: Diagnosis not present

## 2020-01-17 DIAGNOSIS — E669 Obesity, unspecified: Secondary | ICD-10-CM | POA: Diagnosis not present

## 2020-01-24 ENCOUNTER — Other Ambulatory Visit (INDEPENDENT_AMBULATORY_CARE_PROVIDER_SITE_OTHER): Payer: Self-pay | Admitting: Family Medicine

## 2020-01-24 DIAGNOSIS — F3289 Other specified depressive episodes: Secondary | ICD-10-CM

## 2020-01-24 NOTE — Progress Notes (Signed)
Chief Complaint:   OBESITY Melissa Leach is here to discuss her progress with her obesity treatment plan along with follow-up of her obesity related diagnoses. Shaneil is on the Category 2 Plan and keeping a food journal and adhering to recommended goals of 400-550 calories and 40+ grams of protein at supper daily and states she is following her eating plan approximately 50% of the time. Anala states she is doing 0 minutes 0 times per week.  Today's visit was #: 15 Starting weight: 194 lbs Starting date: 04/08/2019 Today's weight: 183 lbs Today's date: 01/17/2020 Total lbs lost to date: 11 Total lbs lost since last in-office visit: 0  Interim History: Meher has done well maintaining her weight even with extra challenges. She is still mindful of her food choices however and has done many things to help minimize weight gain.  Subjective:   1. Other insomnia Jena's sleeps 5-6 hours per night, and she falls asleep with the TV on but wakes easily and struggles to fall back to sleep. She has not taken anything to help her sleep.  Assessment/Plan:   1. Other insomnia The problem of recurrent insomnia was discussed. Orders and follow up as documented in patient record. Counseling: Intensive lifestyle modifications are the first line treatment for this issue. We discussed several lifestyle modifications today. Cylah agreed to start OTC melatonin 5 mg qhs and sleep strategies were discussed in depth today. She will continue to work on diet, exercise and weight loss efforts.   2. Class 1 obesity with serious comorbidity and body mass index (BMI) of 34.0 to 34.9 in adult, unspecified obesity type Gennett is currently in the action stage of change. As such, her goal is to continue with weight loss efforts. She has agreed to the Category 2 Plan.   Behavioral modification strategies: meal planning and cooking strategies and emotional eating strategies.  Cigi has agreed to follow-up with  our clinic in 2 to 3 weeks. She was informed of the importance of frequent follow-up visits to maximize her success with intensive lifestyle modifications for her multiple health conditions.   Objective:   Blood pressure 107/66, pulse 77, temperature 97.9 F (36.6 C), height 5\' 1"  (1.549 m), weight 183 lb (83 kg), last menstrual period 12/27/2002, SpO2 98 %. Body mass index is 34.58 kg/m.  General: Cooperative, alert, well developed, in no acute distress. HEENT: Conjunctivae and lids unremarkable. Cardiovascular: Regular rhythm.  Lungs: Normal work of breathing. Neurologic: No focal deficits.   Lab Results  Component Value Date   CREATININE 0.90 11/01/2019   BUN 22 11/01/2019   NA 139 11/01/2019   K 4.8 11/01/2019   CL 104 11/01/2019   CO2 20 11/01/2019   Lab Results  Component Value Date   ALT 13 11/01/2019   AST 18 11/01/2019   ALKPHOS 105 11/01/2019   BILITOT 0.3 11/01/2019   Lab Results  Component Value Date   HGBA1C 5.5 11/01/2019   HGBA1C 5.5 07/22/2019   HGBA1C 5.6 04/08/2019   Lab Results  Component Value Date   INSULIN 8.3 11/01/2019   INSULIN 9.8 07/22/2019   INSULIN 12.1 04/08/2019   Lab Results  Component Value Date   TSH 0.692 11/01/2019   Lab Results  Component Value Date   CHOL 186 11/01/2019   HDL 41 11/01/2019   LDLCALC 121 (H) 11/01/2019   TRIG 134 11/01/2019   Lab Results  Component Value Date   WBC 7.5 04/08/2019   HGB 12.5 04/08/2019  HCT 37.6 04/08/2019   MCV 92 04/08/2019   PLT 309 04/08/2019   No results found for: IRON, TIBC, FERRITIN  Attestation Statements:   Reviewed by clinician on day of visit: allergies, medications, problem list, medical history, surgical history, family history, social history, and previous encounter notes.  Time spent on visit including pre-visit chart review and post-visit care and charting was 32 minutes.    I, Burt Knack, am acting as transcriptionist for Quillian Quince, MD.  I have  reviewed the above documentation for accuracy and completeness, and I agree with the above. -  Quillian Quince, MD

## 2020-01-24 NOTE — Telephone Encounter (Signed)
Refill form filled out and given to MD to approve/deny.

## 2020-01-25 ENCOUNTER — Other Ambulatory Visit (INDEPENDENT_AMBULATORY_CARE_PROVIDER_SITE_OTHER): Payer: Self-pay | Admitting: Family Medicine

## 2020-01-25 ENCOUNTER — Other Ambulatory Visit: Payer: Self-pay | Admitting: Orthopedic Surgery

## 2020-01-25 DIAGNOSIS — M25512 Pain in left shoulder: Secondary | ICD-10-CM

## 2020-01-25 DIAGNOSIS — E559 Vitamin D deficiency, unspecified: Secondary | ICD-10-CM

## 2020-01-25 DIAGNOSIS — M19012 Primary osteoarthritis, left shoulder: Secondary | ICD-10-CM | POA: Diagnosis not present

## 2020-01-31 ENCOUNTER — Encounter (INDEPENDENT_AMBULATORY_CARE_PROVIDER_SITE_OTHER): Payer: Self-pay

## 2020-01-31 ENCOUNTER — Other Ambulatory Visit (INDEPENDENT_AMBULATORY_CARE_PROVIDER_SITE_OTHER): Payer: Self-pay | Admitting: Family Medicine

## 2020-01-31 DIAGNOSIS — E8881 Metabolic syndrome: Secondary | ICD-10-CM

## 2020-01-31 NOTE — Telephone Encounter (Signed)
Dr Beasley pt 

## 2020-01-31 NOTE — Telephone Encounter (Signed)
MyChart message sent to pt to find out if they have enough medication to get them through until next appt.   

## 2020-02-08 ENCOUNTER — Ambulatory Visit
Admission: RE | Admit: 2020-02-08 | Discharge: 2020-02-08 | Disposition: A | Discharge status: Home / Self Care | Payer: Medicare HMO | Source: Ambulatory Visit | Attending: Orthopedic Surgery | Admitting: Orthopedic Surgery

## 2020-02-08 ENCOUNTER — Other Ambulatory Visit: Payer: Self-pay

## 2020-02-08 DIAGNOSIS — M19012 Primary osteoarthritis, left shoulder: Secondary | ICD-10-CM | POA: Diagnosis not present

## 2020-02-08 DIAGNOSIS — G8929 Other chronic pain: Secondary | ICD-10-CM | POA: Diagnosis not present

## 2020-02-08 DIAGNOSIS — M25512 Pain in left shoulder: Secondary | ICD-10-CM

## 2020-02-10 ENCOUNTER — Encounter (INDEPENDENT_AMBULATORY_CARE_PROVIDER_SITE_OTHER): Payer: Self-pay | Admitting: Family Medicine

## 2020-02-10 ENCOUNTER — Ambulatory Visit (INDEPENDENT_AMBULATORY_CARE_PROVIDER_SITE_OTHER): Payer: Medicare HMO | Admitting: Family Medicine

## 2020-02-10 ENCOUNTER — Other Ambulatory Visit: Payer: Self-pay

## 2020-02-10 VITALS — BP 104/67 | HR 70 | Temp 97.6°F | Ht 61.0 in | Wt 179.0 lb

## 2020-02-10 DIAGNOSIS — Z9189 Other specified personal risk factors, not elsewhere classified: Secondary | ICD-10-CM | POA: Diagnosis not present

## 2020-02-10 DIAGNOSIS — Z6833 Body mass index (BMI) 33.0-33.9, adult: Secondary | ICD-10-CM | POA: Diagnosis not present

## 2020-02-10 DIAGNOSIS — E669 Obesity, unspecified: Secondary | ICD-10-CM | POA: Diagnosis not present

## 2020-02-10 DIAGNOSIS — E559 Vitamin D deficiency, unspecified: Secondary | ICD-10-CM | POA: Diagnosis not present

## 2020-02-10 DIAGNOSIS — R7303 Prediabetes: Secondary | ICD-10-CM | POA: Diagnosis not present

## 2020-02-10 MED ORDER — VITAMIN D (ERGOCALCIFEROL) 1.25 MG (50000 UNIT) PO CAPS: ORAL_CAPSULE | ORAL | 0 refills | Status: DC

## 2020-02-10 MED ORDER — METFORMIN HCL 500 MG PO TABS: ORAL_TABLET | ORAL | 0 refills | Status: DC

## 2020-02-14 NOTE — Progress Notes (Signed)
Chief Complaint:   OBESITY Melissa Leach is here to discuss her progress with her obesity treatment plan along with follow-up of her obesity related diagnoses. Melissa Leach is on the Category 2 Plan and states she is following her eating plan approximately 80% of the time. Melissa Leach states she is walking the dog for 30 minutes 7 times per week.  Today's visit was #: 16 Starting weight: 194 lbs Starting date: 04/08/2019 Today's weight: 179 lbs Today's date: 02/10/2020 Total lbs lost to date: 15 Total lbs lost since last in-office visit: 4  Interim History: Melissa Leach continues to do well with weight loss on her Category 2 eating plan. She notes her hunger is mostly controlled and she is exercising regularly. She doesn't feel Thanksgiving will be too challenging for her.  Subjective:   1. Vitamin D deficiency Melissa Leach is on Vit D, and she seems to be doing well taking a weekly dose.  2. Pre-diabetes Melissa Leach continues to do great on her eating plan and her weight loss is surely improving her pre-diabetes. She is stable on metformin.  3. At risk for impaired metabolic function Melissa Leach is at increased risk for impaired metabolic function due to continued weight loss.  Assessment/Plan:   1. Vitamin D deficiency Low Vitamin D level contributes to fatigue and are associated with obesity, breast, and colon cancer. We will refill prescription Vitamin D for 1 month, and we will recheck labs in 1 month. Melissa Leach will follow-up for routine testing of Vitamin D, at least 2-3 times per year to avoid over-replacement.  - Vitamin D, Ergocalciferol, (DRISDOL) 1.25 MG (50000 UNIT) CAPS capsule; TAKE 1 CAPSULE BY MOUTH ONE TIME PER WEEK  Dispense: 4 capsule; Refill: 0  2. Pre-diabetes Melissa Leach will continue to work on weight loss, exercise, and decreasing simple carbohydrates to help decrease the risk of diabetes. We will refill metformin for 1 month, and we will recheck labs in 1 month.  - metFORMIN (GLUCOPHAGE)  500 MG tablet; TAKE 1 TABLET BY MOUTH EVERY DAY WITH BREAKFAST  Dispense: 30 tablet; Refill: 0  3. At risk for impaired metabolic function Melissa Leach was given approximately 15 minutes of impaired  metabolic function prevention counseling today. We discussed intensive lifestyle modifications today with an emphasis on specific nutrition and exercise instructions and strategies.   Repetitive spaced learning was employed today to elicit superior memory formation and behavioral change.  4. Class 1 obesity with serious comorbidity and body mass index (BMI) of 33.0 to 33.9 in adult, unspecified obesity type Melissa Leach is currently in the action stage of change. As such, her goal is to continue with weight loss efforts. She has agreed to the Category 2 Plan.   Exercise goals: As is.  Behavioral modification strategies: holiday eating strategies .  Melissa Leach has agreed to follow-up with our clinic in 3 weeks. She was informed of the importance of frequent follow-up visits to maximize her success with intensive lifestyle modifications for her multiple health conditions.   Objective:   Blood pressure 104/67, pulse 70, temperature 97.6 F (36.4 C), height 5\' 1"  (1.549 m), weight 179 lb (81.2 kg), last menstrual period 12/27/2002, SpO2 97 %. Body mass index is 33.82 kg/m.  General: Cooperative, alert, well developed, in no acute distress. HEENT: Conjunctivae and lids unremarkable. Cardiovascular: Regular rhythm.  Lungs: Normal work of breathing. Neurologic: No focal deficits.   Lab Results  Component Value Date   CREATININE 0.90 11/01/2019   BUN 22 11/01/2019   NA 139 11/01/2019   K  4.8 11/01/2019   CL 104 11/01/2019   CO2 20 11/01/2019   Lab Results  Component Value Date   ALT 13 11/01/2019   AST 18 11/01/2019   ALKPHOS 105 11/01/2019   BILITOT 0.3 11/01/2019   Lab Results  Component Value Date   HGBA1C 5.5 11/01/2019   HGBA1C 5.5 07/22/2019   HGBA1C 5.6 04/08/2019   Lab Results    Component Value Date   INSULIN 8.3 11/01/2019   INSULIN 9.8 07/22/2019   INSULIN 12.1 04/08/2019   Lab Results  Component Value Date   TSH 0.692 11/01/2019   Lab Results  Component Value Date   CHOL 186 11/01/2019   HDL 41 11/01/2019   LDLCALC 121 (H) 11/01/2019   TRIG 134 11/01/2019   Lab Results  Component Value Date   WBC 7.5 04/08/2019   HGB 12.5 04/08/2019   HCT 37.6 04/08/2019   MCV 92 04/08/2019   PLT 309 04/08/2019   No results found for: IRON, TIBC, FERRITIN  Attestation Statements:   Reviewed by clinician on day of visit: allergies, medications, problem list, medical history, surgical history, family history, social history, and previous encounter notes.   I, Burt Knack, am acting as transcriptionist for Quillian Quince, MD.  I have reviewed the above documentation for accuracy and completeness, and I agree with the above. -  Quillian Quince, MD

## 2020-02-16 ENCOUNTER — Other Ambulatory Visit (INDEPENDENT_AMBULATORY_CARE_PROVIDER_SITE_OTHER): Payer: Self-pay | Admitting: Family Medicine

## 2020-02-16 DIAGNOSIS — F3289 Other specified depressive episodes: Secondary | ICD-10-CM

## 2020-02-16 NOTE — Telephone Encounter (Signed)
Last seen by Dr Beasley 

## 2020-02-17 ENCOUNTER — Encounter (INDEPENDENT_AMBULATORY_CARE_PROVIDER_SITE_OTHER): Payer: Self-pay

## 2020-02-17 NOTE — Telephone Encounter (Signed)
MyChart message sent to pt to find out if they have enough medication to get them through until next appt.   

## 2020-02-17 NOTE — Telephone Encounter (Signed)
Pt indicates that she does not need a refill at this time.

## 2020-02-18 DIAGNOSIS — T8484XA Pain due to internal orthopedic prosthetic devices, implants and grafts, initial encounter: Secondary | ICD-10-CM | POA: Diagnosis not present

## 2020-02-18 DIAGNOSIS — Z96651 Presence of right artificial knee joint: Secondary | ICD-10-CM | POA: Diagnosis not present

## 2020-03-02 ENCOUNTER — Ambulatory Visit (INDEPENDENT_AMBULATORY_CARE_PROVIDER_SITE_OTHER): Payer: Medicare HMO | Admitting: Family Medicine

## 2020-03-04 ENCOUNTER — Other Ambulatory Visit (INDEPENDENT_AMBULATORY_CARE_PROVIDER_SITE_OTHER): Payer: Self-pay | Admitting: Family Medicine

## 2020-03-04 DIAGNOSIS — R7303 Prediabetes: Secondary | ICD-10-CM

## 2020-03-07 DIAGNOSIS — H43811 Vitreous degeneration, right eye: Secondary | ICD-10-CM | POA: Diagnosis not present

## 2020-03-09 ENCOUNTER — Encounter (INDEPENDENT_AMBULATORY_CARE_PROVIDER_SITE_OTHER): Payer: Self-pay | Admitting: Family Medicine

## 2020-03-09 ENCOUNTER — Ambulatory Visit (INDEPENDENT_AMBULATORY_CARE_PROVIDER_SITE_OTHER): Payer: Medicare HMO | Admitting: Family Medicine

## 2020-03-09 ENCOUNTER — Other Ambulatory Visit: Payer: Self-pay

## 2020-03-09 VITALS — BP 94/63 | HR 72 | Temp 97.4°F | Ht 61.0 in | Wt 175.0 lb

## 2020-03-09 DIAGNOSIS — R7303 Prediabetes: Secondary | ICD-10-CM | POA: Diagnosis not present

## 2020-03-09 DIAGNOSIS — Z6833 Body mass index (BMI) 33.0-33.9, adult: Secondary | ICD-10-CM | POA: Diagnosis not present

## 2020-03-09 DIAGNOSIS — E559 Vitamin D deficiency, unspecified: Secondary | ICD-10-CM

## 2020-03-09 DIAGNOSIS — E669 Obesity, unspecified: Secondary | ICD-10-CM | POA: Diagnosis not present

## 2020-03-09 MED ORDER — VITAMIN D (ERGOCALCIFEROL) 1.25 MG (50000 UNIT) PO CAPS: ORAL_CAPSULE | ORAL | 1 refills | Status: DC

## 2020-03-09 MED ORDER — METFORMIN HCL 500 MG PO TABS: ORAL_TABLET | ORAL | 1 refills | Status: DC

## 2020-03-10 DIAGNOSIS — M25512 Pain in left shoulder: Secondary | ICD-10-CM | POA: Diagnosis not present

## 2020-03-13 NOTE — Progress Notes (Signed)
Chief Complaint:   OBESITY Melissa Leach is here to discuss her progress with her obesity treatment plan along with follow-up of her obesity related diagnoses. Melissa Leach is on the Category 2 Plan and states she is following her eating plan approximately 70% of the time. Melissa Leach states she is walking the dog and doing hi exercises for 20 minutes 7 times per week.  Today's visit was #: 17 Starting weight: 194 lbs Starting date: 04/08/2019 Today's weight: 175 lbs Today's date: 03/09/2020 Total lbs lost to date: 19 Total lbs lost since last in-office visit: 4  Interim History: Melissa Leach has done well with weight loss. She has done well with portion control and making smarter choices. She will be traveling for Christmas and then having surgery when she gets back in January.  Subjective:   1. Pre-diabetes Melissa Leach is stable on metformin, and she is tolerating it well. She is working on diet and weight loss.  2. Vitamin D deficiency Melissa Leach is stable on Vit D, and she denies nausea, vomiting, or muscle weakness.  Assessment/Plan:   1. Pre-diabetes Melissa Leach will continue to work on weight loss, exercise, and decreasing simple carbohydrates to help decrease the risk of diabetes. We will refill metformin for 2 months.  - metFORMIN (GLUCOPHAGE) 500 MG tablet; TAKE 1 TABLET BY MOUTH EVERY DAY WITH BREAKFAST  Dispense: 30 tablet; Refill: 1  2. Vitamin D deficiency Low Vitamin D level contributes to fatigue and are associated with obesity, breast, and colon cancer. We will refill prescription Vitamin D for 2 months. Melissa Leach will follow-up for routine testing of Vitamin D, at least 2-3 times per year to avoid over-replacement.  - Vitamin D, Ergocalciferol, (DRISDOL) 1.25 MG (50000 UNIT) CAPS capsule; TAKE 1 CAPSULE BY MOUTH ONE TIME PER WEEK  Dispense: 4 capsule; Refill: 1  3. Class 1 obesity with serious comorbidity and body mass index (BMI) of 33.0 to 33.9 in adult, unspecified obesity type Melissa Leach  is currently in the action stage of change. As such, her goal is to continue with weight loss efforts. She has agreed to the Category 2 Plan.   Exercise goals: As is.  Behavioral modification strategies: increasing lean protein intake, meal planning and cooking strategies, travel eating strategies and holiday eating strategies .  Melissa Leach has agreed to follow-up with our clinic in 8 weeks. She was informed of the importance of frequent follow-up visits to maximize her success with intensive lifestyle modifications for her multiple health conditions.   Objective:   Blood pressure 94/63, pulse 72, temperature (!) 97.4 F (36.3 C), height 5\' 1"  (1.549 m), weight 175 lb (79.4 kg), last menstrual period 12/27/2002, SpO2 95 %. Body mass index is 33.07 kg/m.  General: Cooperative, alert, well developed, in no acute distress. HEENT: Conjunctivae and lids unremarkable. Cardiovascular: Regular rhythm.  Lungs: Normal work of breathing. Neurologic: No focal deficits.   Lab Results  Component Value Date   CREATININE 0.90 11/01/2019   BUN 22 11/01/2019   NA 139 11/01/2019   K 4.8 11/01/2019   CL 104 11/01/2019   CO2 20 11/01/2019   Lab Results  Component Value Date   ALT 13 11/01/2019   AST 18 11/01/2019   ALKPHOS 105 11/01/2019   BILITOT 0.3 11/01/2019   Lab Results  Component Value Date   HGBA1C 5.5 11/01/2019   HGBA1C 5.5 07/22/2019   HGBA1C 5.6 04/08/2019   Lab Results  Component Value Date   INSULIN 8.3 11/01/2019   INSULIN 9.8 07/22/2019  INSULIN 12.1 04/08/2019   Lab Results  Component Value Date   TSH 0.692 11/01/2019   Lab Results  Component Value Date   CHOL 186 11/01/2019   HDL 41 11/01/2019   LDLCALC 121 (H) 11/01/2019   TRIG 134 11/01/2019   Lab Results  Component Value Date   WBC 7.5 04/08/2019   HGB 12.5 04/08/2019   HCT 37.6 04/08/2019   MCV 92 04/08/2019   PLT 309 04/08/2019   No results found for: IRON, TIBC, FERRITIN  Obesity Behavioral  Intervention:   Approximately 15 minutes were spent on the discussion below.  ASK: We discussed the diagnosis of obesity with Melissa Leach today and Melissa Leach agreed to give [place] permission to discuss obesity behavioral modification therapy today.  ASSESS: Melissa Leach has the diagnosis of obesity and her BMI today is 33.08. Angeliki is in the action stage of change.   ADVISE: Melissa Leach was educated on the multiple health risks of obesity as well as the benefit of weight loss to improve her health. She was advised of the need for long term treatment and the importance of lifestyle modifications to improve her current health and to decrease her risk of future health problems.  AGREE: Multiple dietary modification options and treatment options were discussed and Melissa Leach agreed to follow the recommendations documented in the above note.  ARRANGE: Melissa Leach was educated on the importance of frequent visits to treat obesity as outlined per CMS and USPSTF guidelines and agreed to schedule her next follow up appointment today.  Attestation Statements:   Reviewed by clinician on day of visit: allergies, medications, problem list, medical history, surgical history, family history, social history, and previous encounter notes.   I, Burt Knack, am acting as transcriptionist for Quillian Quince, MD.  I have reviewed the above documentation for accuracy and completeness, and I agree with the above. -  Quillian Quince, MD

## 2020-03-14 DIAGNOSIS — R69 Illness, unspecified: Secondary | ICD-10-CM | POA: Diagnosis not present

## 2020-03-14 DIAGNOSIS — E039 Hypothyroidism, unspecified: Secondary | ICD-10-CM | POA: Diagnosis not present

## 2020-03-14 DIAGNOSIS — E8881 Metabolic syndrome: Secondary | ICD-10-CM | POA: Diagnosis not present

## 2020-03-14 DIAGNOSIS — M25512 Pain in left shoulder: Secondary | ICD-10-CM | POA: Diagnosis not present

## 2020-03-14 DIAGNOSIS — R7303 Prediabetes: Secondary | ICD-10-CM | POA: Diagnosis not present

## 2020-03-14 DIAGNOSIS — Z01818 Encounter for other preprocedural examination: Secondary | ICD-10-CM | POA: Diagnosis not present

## 2020-03-14 LAB — BASIC METABOLIC PANEL
BUN: 26 — AB (ref 4–21)
CO2: 29 — AB (ref 13–22)
Chloride: 102 (ref 99–108)
Creatinine: 0.9 (ref <=1.1)
Glucose: 90
Potassium: 3.8 (ref 3.4–5.3)
Sodium: 137 (ref 137–147)

## 2020-03-14 LAB — COMPREHENSIVE METABOLIC PANEL
Albumin: 4.4 (ref 3.5–5.0)
Calcium: 10.1 (ref 8.7–10.7)
GFR calc Af Amer: 76
GFR calc non Af Amer: 63

## 2020-03-14 LAB — CBC AND DIFFERENTIAL
HCT: 40 (ref 36–46)
Hemoglobin: 13.4 (ref 12.0–16.0)
Platelets: 264 (ref 150–399)
WBC: 4.9

## 2020-03-14 LAB — PROTIME-INR: Protime: 10.3 (ref 10.0–13.8)

## 2020-03-14 LAB — TSH: TSH: 0.07 — AB (ref <=5.90)

## 2020-03-14 LAB — CBC: RBC: 4.25 (ref 3.87–5.11)

## 2020-03-14 LAB — HEMOGLOBIN A1C: Hemoglobin A1C: 5.4

## 2020-03-14 LAB — HEPATIC FUNCTION PANEL
ALT: 15 (ref 7–35)
AST: 19 (ref 13–35)
Alkaline Phosphatase: 86 (ref 25–125)
Bilirubin, Total: 0.5

## 2020-03-14 LAB — POCT INR: INR: 1 (ref <=1.1)

## 2020-03-15 ENCOUNTER — Encounter (INDEPENDENT_AMBULATORY_CARE_PROVIDER_SITE_OTHER): Payer: Self-pay

## 2020-03-15 ENCOUNTER — Other Ambulatory Visit (INDEPENDENT_AMBULATORY_CARE_PROVIDER_SITE_OTHER): Payer: Self-pay | Admitting: Family Medicine

## 2020-03-15 DIAGNOSIS — F3289 Other specified depressive episodes: Secondary | ICD-10-CM

## 2020-03-15 NOTE — Telephone Encounter (Signed)
MyChart message sent to pt to find out if they have enough medication to get them through until next appt.   

## 2020-03-16 DIAGNOSIS — M25512 Pain in left shoulder: Secondary | ICD-10-CM | POA: Diagnosis not present

## 2020-03-20 ENCOUNTER — Encounter (INDEPENDENT_AMBULATORY_CARE_PROVIDER_SITE_OTHER): Payer: Self-pay

## 2020-03-22 DIAGNOSIS — M25512 Pain in left shoulder: Secondary | ICD-10-CM | POA: Diagnosis not present

## 2020-03-28 NOTE — Progress Notes (Signed)
Please enter orders for PAT visit 04-05-19.

## 2020-03-28 NOTE — Patient Instructions (Addendum)
DUE TO COVID-19 ONLY ONE VISITOR IS ALLOWED TO COME WITH YOU AND STAY IN THE WAITING ROOM ONLY DURING PRE OP AND PROCEDURE.   IF YOU WILL BE ADMITTED INTO THE HOSPITAL YOU ARE ALLOWED ONE SUPPORT PERSON DURING VISITATION HOURS ONLY (10AM -8PM)   . The support person may change daily. . The support person must pass our screening, gel in and out, and wear a mask at all times, including in the patient's room. . Patients must also wear a mask when staff or their support person are in the room.   COVID SWAB TESTING MUST BE COMPLETED ON:  Tuesday, 04-04-20 @ 9:45 AM   [address] [place], [place] [address]  (Must self quarantine after testing. Follow instructions on handout.)        Your procedure is scheduled on:  Friday, 04-07-20   Report to [facility] Main  Entrance   Report to Short Stay at 5:30 AM   [facility])    Call this number if you have problems the morning of surgery [phone]   Do not eat food :After Midnight.   May have liquids until 4:30 AM day of surgery  CLEAR LIQUID DIET  Foods Allowed                                                                     Foods Excluded  Water, Black Coffee and tea, regular and decaf             liquids that you cannot  Plain Jell-O in any flavor  (No red)                                    see through such as: Fruit ices (not with fruit pulp)                                      milk, soups, orange juice              Iced Popsicles (No red)                                      All solid food                                   Apple juices Sports drinks like Gatorade (No red) Lightly seasoned clear broth or consume(fat free) Sugar, honey syrup    Complete one G2 drink the morning of surgery at 4:30 AM  the day of surgery.     Oral Hygiene is also important to reduce your risk of infection.                                    Remember - BRUSH YOUR TEETH THE MORNING OF SURGERY WITH YOUR REGULAR TOOTHPASTE   Do NOT smoke  after Midnight  Take these medicines the morning of surgery with A SIP OF WATER:  Buspar, Zyrtec, Synthroid, Protonix, Zoloft  DO NOT TAKE ANY ORAL DIABETIC MEDICATIONS DAY OF YOUR SURGERY - Do not take Metformin day of surgery                               You may not have any metal on your body including hair pins, jewelry, and body piercings             Do not wear make-up, lotions, powders, perfumes/cologne, or deodorant             Do not wear nail polish.  Do not shave  48 hours prior to surgery.             Do not bring valuables to the hospital. [facility] IS NOT RESPONSIBLE   FOR VALUABLES.   Contacts, dentures or bridgework may not be worn into surgery.   Bring small overnight bag day of surgery.                 Please read over the following fact sheets you were given: IF YOU HAVE QUESTIONS ABOUT YOUR PRE OP INSTRUCTIONS PLEASE CALL [phone]  [place]- Preparing for Total Shoulder Arthroplasty    Before surgery, you can play an important role. Because skin is not sterile, your skin needs to be as free of germs as possible. You can reduce the number of germs on your skin by using the following products. . Benzoyl Peroxide Gel o Reduces the number of germs present on the skin o Applied twice a day to shoulder area starting two days before surgery    ==================================================================  Please follow these instructions carefully:  BENZOYL PEROXIDE 5% GEL  Please do not use if you have an allergy to benzoyl peroxide.   If your skin becomes reddened/irritated stop using the benzoyl peroxide.  Starting two days before surgery, apply as follows: 1. Apply benzoyl peroxide in the morning and at night. Apply after taking a shower. If you are not taking a shower clean entire shoulder front, back, and side along with the armpit with a clean wet washcloth.  2. Place a quarter-sized dollop on your shoulder and rub in thoroughly, making sure  to cover the front, back, and side of your shoulder, along with the armpit.   2 days before ____ AM   ____ PM              1 day before ____ AM   ____ PM                         3. Do this twice a day for two days.  (Last application is the night before surgery, AFTER using the CHG soap as described below).  4. Do NOT apply benzoyl peroxide gel on the day of surgery.   [place] - Preparing for Surgery Before surgery, you can play an important role.  Because skin is not sterile, your skin needs to be as free of germs as possible.  You can reduce the number of germs on your skin by washing with CHG (chlorahexidine gluconate) soap before surgery.  CHG is an antiseptic cleaner which kills germs and bonds with the skin to continue killing germs even after washing. Please DO NOT use if you have an allergy to CHG or antibacterial soaps.  If your skin  becomes reddened/irritated stop using the CHG and inform your nurse when you arrive at Short Stay. Do not shave (including legs and underarms) for at least 48 hours prior to the first CHG shower.  You may shave your face/neck.  Please follow these instructions carefully:  1.  Shower with CHG Soap the night before surgery and the  morning of surgery.  2.  If you choose to wash your hair, wash your hair first as usual with your normal  shampoo.  3.  After you shampoo, rinse your hair and body thoroughly to remove the shampoo.                             4.  Use CHG as you would any other liquid soap.  You can apply chg directly to the skin and wash.  Gently with a scrungie or clean washcloth.  5.  Apply the CHG Soap to your body ONLY FROM THE NECK DOWN.   Do   not use on face/ open                           Wound or open sores. Avoid contact with eyes, ears mouth and   genitals (private parts).                       Wash face,  Genitals (private parts) with your normal soap.             6.  Wash thoroughly, paying special attention to the area where your     surgery  will be performed.  7.  Thoroughly rinse your body with warm water from the neck down.  8.  DO NOT shower/wash with your normal soap after using and rinsing off the CHG Soap.                9.  Pat yourself dry with a clean towel.            10.  Wear clean pajamas.            11.  Place clean sheets on your bed the night of your first shower and do not  sleep with pets. Day of Surgery : Do not apply any lotions/deodorants the morning of surgery.  Please wear clean clothes to the hospital/surgery center.  FAILURE TO FOLLOW THESE INSTRUCTIONS MAY RESULT IN THE CANCELLATION OF YOUR SURGERY  PATIENT SIGNATURE_________________________________  NURSE SIGNATURE__________________________________  ________________________________________________________________________    Rogelia Mire  An incentive spirometer is a tool that can help keep your lungs clear and active. This tool measures how well you are filling your lungs with each breath. Taking long deep breaths may help reverse or decrease the chance of developing breathing (pulmonary) problems (especially infection) following:  A long period of time when you are unable to move or be active. BEFORE THE PROCEDURE   If the spirometer includes an indicator to show your best effort, your nurse or respiratory therapist will set it to a desired goal.  If possible, sit up straight or lean slightly forward. Try not to slouch.  Hold the incentive spirometer in an upright position. INSTRUCTIONS FOR USE  1. Sit on the edge of your bed if possible, or sit up as far as you can in bed or on a chair. 2. Hold the incentive spirometer in an upright position. 3. Breathe out normally. 4. Place  the mouthpiece in your mouth and seal your lips tightly around it. 5. Breathe in slowly and as deeply as possible, raising the piston or the ball toward the top of the column. 6. Hold your breath for 3-5 seconds or for as long as possible. Allow  the piston or ball to fall to the bottom of the column. 7. Remove the mouthpiece from your mouth and breathe out normally. 8. Rest for a few seconds and repeat Steps 1 through 7 at least 10 times every 1-2 hours when you are awake. Take your time and take a few normal breaths between deep breaths. 9. The spirometer may include an indicator to show your best effort. Use the indicator as a goal to work toward during each repetition. 10. After each set of 10 deep breaths, practice coughing to be sure your lungs are clear. If you have an incision (the cut made at the time of surgery), support your incision when coughing by placing a pillow or rolled up towels firmly against it. Once you are able to get out of bed, walk around indoors and cough well. You may stop using the incentive spirometer when instructed by your caregiver.  RISKS AND COMPLICATIONS  Take your time so you do not get dizzy or light-headed.  If you are in pain, you may need to take or ask for pain medication before doing incentive spirometry. It is harder to take a deep breath if you are having pain. AFTER USE  Rest and breathe slowly and easily.  It can be helpful to keep track of a log of your progress. Your caregiver can provide you with a simple table to help with this. If you are using the spirometer at home, follow these instructions: SEEK MEDICAL CARE IF:   You are having difficultly using the spirometer.  You have trouble using the spirometer as often as instructed.  Your pain medication is not giving enough relief while using the spirometer.  You develop fever of 100.5 F (38.1 C) or higher. SEEK IMMEDIATE MEDICAL CARE IF:   You cough up bloody sputum that had not been present before.  You develop fever of 102 F (38.9 C) or greater.  You develop worsening pain at or near the incision site. MAKE SURE YOU:   Understand these instructions.  Will watch your condition.  Will get help right away if you are  not doing well or get worse. Document Released: 07/29/2006 Document Revised: 06/10/2011 Document Reviewed: 09/29/2006 [facility] Patient Information 2014 [place], [place].   ________________________________________________________________________

## 2020-03-28 NOTE — H&P (Signed)
Patient's anticipated LOS is less than 2 midnights, meeting these requirements: - Younger than 62 - Lives within 1 hour of care - Has a competent adult at home to recover with post-op recover - NO history of  - Chronic pain requiring opiods  - Diabetes  - Coronary Artery Disease  - Heart failure  - Heart attack  - Stroke  - DVT/VTE  - Cardiac arrhythmia  - Respiratory Failure/COPD  - Renal failure  - Anemia  - Advanced Liver disease       Melissa Leach is an 69 y.o. female.    Chief Complaint: left shoulder pain  HPI: Pt is a 69 y.o. female complaining of left shoulder pain for multiple years. Pain had continually increased since the beginning. X-rays in the clinic show end-stage arthritic changes of the left shoulder. Pt has tried various conservative treatments which have failed to alleviate their symptoms, including injections and therapy. Various options are discussed with the patient. Risks, benefits and expectations were discussed with the patient. Patient understand the risks, benefits and expectations and wishes to proceed with surgery.   PCP:  Mila Palmer, MD  D/C Plans: Home  PMH: Past Medical History:  Diagnosis Date  . Anemia   . Anxiety   . Arthritis    SEVERE PAIN RIGHT HIP  . Blood in urine    "ALWAYS"  --AND STATES ALWAYS TREATED FOR UTI--BUT HAS NEVER HAD UTI.  . Cellulitis   . Chronic back pain   . Complication of anesthesia    "SCRATCHED CORNEA"  WAKING UP FROM  KNEE SURGERY--'82  . Depression    PT'S HUSBAND DIED OF CANCER AT [facility] 02-02-12  . Gastric ulcer   . Gastritis    HISTORY OF GASTRITIS--OCCAS ABDOMINAL PAIN   . GERD (gastroesophageal reflux disease)    takes Omeprazole daily and states only bc she takes Mobic  . H/O hiatal hernia   . Headache(784.0)    MIGRAINE- INFREQUENT  . History of migraine    takes Topamax daily and Imitrex prn  . History of shingles   . Hyperlipidemia    takes Lipitor daily  . Hypothyroidism  05/13/12  . Insomnia    takes Xanax and Ativan prn  . Joint pain   . Joint swelling   . Lichenoid dermatitis 03/16/2007   perianal biopsy  . Nocturia   . Osteoarthritis   . Recurrent sinus infections   . Stomach ulcer   . Vitamin D deficiency     PSH: Past Surgical History:  Procedure Laterality Date  . COLONOSCOPY    . epidural injections     x 2 in the past 4weeks  . ESOPHAGOGASTRODUODENOSCOPY    . EYE SURGERY  1994   RA surgery-  . HEMATOMA EVACUATION  12/09/2011   Procedure: EVACUATION HEMATOMA;  Surgeon: [name], MD;  Location: [facility];  Service: Orthopedics;  Laterality: Right;  Evacuation of Hematoma Right Knee, I & D and Closed Manipulation of Right Knee  . IRRIGATION AND DEBRIDEMENT KNEE  12/09/2011   Procedure: IRRIGATION AND DEBRIDEMENT KNEE;  Surgeon: [name], MD;  Location: [facility];  Service: Orthopedics;  Laterality: Right;  . JOINT REPLACEMENT     RIGHT TOTAL KNEE REPLACEMENT AND RT TOTAL KNEE REVISION    AND  LEFT TOTAL KNEE REPLACEMENT  . KNEE CLOSED REDUCTION  12/09/2011   Procedure: CLOSED MANIPULATION KNEE;  Surgeon: [name], MD;  Location: [facility];  Service: Orthopedics;  Laterality: Right;  .  LUMBAR SPINE SURGERY  05/15/12   3 disc fusion  . TONSILLECTOMY    . TOTAL HIP ARTHROPLASTY  06/25/2011   Procedure: TOTAL HIP ARTHROPLASTY ANTERIOR APPROACH;  Surgeon: [name], MD;  Location: [facility];  Service: Orthopedics;  Laterality: Left;  . TOTAL HIP ARTHROPLASTY  03/02/2012   Procedure: TOTAL HIP ARTHROPLASTY ANTERIOR APPROACH;  Surgeon: [name], MD;  Location: [facility];  Service: Orthopedics;  Laterality: Right;  . TOTAL KNEE REVISION  10/28/2011   Procedure: TOTAL KNEE REVISION;  Surgeon: [name], MD;  Location: [facility];  Service: Orthopedics;  Laterality: Right;  . TOTAL SHOULDER ARTHROPLASTY Right 01/03/2017   Procedure: RIGHT SHOULDER ANATOMIC SHOULDER REPLACEMENT AND ROTATOR CUFF REPAIR;  Surgeon: Beverely Low, MD;  Location: [facility]  OR;  Service: Orthopedics;  Laterality: Right;  . TUBAL LIGATION    . WOUND EXPLORATION Bilateral 07/06/2012   Procedure: WOUND EXPLORATION lumbar;  Surgeon: Temple Pacini, MD;  Location: [facility];  Service: Neurosurgery;  Laterality: Bilateral;  Incision and Drainage    Social History:  reports that she quit smoking about 43 years ago. Her smoking use included cigarettes. She smoked 0.50 packs per day. She has never used smokeless tobacco. She reports current alcohol use of about 2.0 standard drinks of alcohol per week. She reports that she does not use drugs.  Allergies:  Allergies  Allergen Reactions  . Ceftriaxone Other (See Comments)    gallstones  . Sulfa Antibiotics Rash    Medications: No current facility-administered medications for this encounter.   Current Outpatient Medications  Medication Sig Dispense Refill  . atorvastatin (LIPITOR) 40 MG tablet Take 40 mg by mouth every evening.    . busPIRone (BUSPAR) 7.5 MG tablet Take 7.5 mg by mouth 2 (two) times daily.    . Calcium Carb-Cholecalciferol (CALCIUM 600+D3 PO) Take 1 tablet by mouth daily.    . cetirizine (ZYRTEC) 10 MG tablet Take 10 mg by mouth daily.    . clobetasol ointment (TEMOVATE) 0.05 % Apply 1 application topically 2 (two) times daily. Apply twice daily pea size amount to affected area for 2 weeks (Patient taking differently: Apply 1 application topically 2 (two) times daily as needed (irritation).) 30 g 0  . doxycycline (VIBRAMYCIN) 50 MG capsule Take 50 mg by mouth daily.    . hydrocortisone 2.5 % cream Apply 1 application topically daily as needed (irritation).    Marland Kitchen levothyroxine (SYNTHROID) 88 MCG tablet Take 88 mcg by mouth daily before breakfast.    . meloxicam (MOBIC) 15 MG tablet Take 15 mg by mouth daily.    . metFORMIN (GLUCOPHAGE) 500 MG tablet TAKE 1 TABLET BY MOUTH EVERY DAY WITH BREAKFAST (Patient taking differently: Take 500 mg by mouth daily with breakfast. TAKE 1 TABLET BY MOUTH EVERY DAY WITH  BREAKFAST) 30 tablet 1  . Multiple Vitamin (MULTIVITAMIN WITH MINERALS) TABS Take 1 tablet by mouth daily.    . Multiple Vitamins-Minerals (PRESERVISION AREDS 2+MULTI VIT PO) Take 1 capsule by mouth 2 (two) times daily.    . pantoprazole (PROTONIX) 40 MG tablet Take 40 mg by mouth daily.    . sertraline (ZOLOFT) 50 MG tablet Take 100 mg by mouth daily.    . SUMAtriptan (IMITREX) 100 MG tablet Take 100 mg by mouth every 2 (two) hours as needed for migraine.    . topiramate (TOPAMAX) 50 MG tablet TAKE 1 TABLET BY MOUTH EVERYDAY AT BEDTIME (Patient taking differently: Take 50 mg by mouth daily.  TAKE 1 TABLET BY MOUTH EVERYDAY AT BEDTIME) 30 tablet 0  . Vitamin D, Ergocalciferol, (DRISDOL) 1.25 MG (50000 UNIT) CAPS capsule TAKE 1 CAPSULE BY MOUTH ONE TIME PER WEEK 4 capsule 1    No results found for this or any previous visit (from the past 48 hour(s)). No results found.  ROS: Pain with rom of the left upper extremity  Physical Exam: Alert and oriented 69 y.o. female in no acute distress Cranial nerves 2-12 intact Cervical spine: full rom with no tenderness, nv intact distally Chest: active breath sounds bilaterally, no wheeze rhonchi or rales Heart: regular rate and rhythm, no murmur Abd: non tender non distended with active bowel sounds Hip is stable with rom  Left shoulder with painful rom and limited strength Pain with ER and IR No rashes or edema distally  Assessment/Plan Assessment: left shoulder cuff arthropathy   Plan:  Patient will undergo a left reverse total shoulder by Dr. Ranell Patrick at [facility] Risks benefits and expectations were discussed with the patient. Patient understand risks, benefits and expectations and wishes to proceed. Preoperative templating of the joint replacement has been completed, documented, and submitted to the Operating Room personnel in order to optimize intra-operative equipment management.   Alphonsa Overall PA-C, MPAS [facility] is now  [employer] [address], [address], [place], [place] [address] Phone: [phone] [email or web address] Facebook  [employer]

## 2020-03-28 NOTE — Progress Notes (Addendum)
COVID Vaccine Completed:  x3 Date COVID Vaccine completed: 04-22-19, 05-13-19, 12-27-19 Booster COVID vaccine manufacturer: Pfizer     PCP - Mila Palmer, MD Cardiologist - N/A  Chest x-ray - N/A EKG - 12-21 at PCP Stress Test -  ECHO -  Cardiac Cath -  Pacemaker/ICD device last checked:  Sleep Study -  N/A CPAP -   Fasting Blood Sugar -  N/A Checks Blood Sugar _____ times a day  Blood Thinner Instructions: Aspirin Instructions: N/A Last Dose:  Anesthesia review:   Patient denies shortness of breath, fever, cough and chest pain at PAT appointment.  Patient able to climb a flight of stairs and perform ADLs independently.   Patient verbalized understanding of instructions that were given to them at the PAT appointment. Patient was also instructed that they will need to review over the PAT instructions again at home before surgery.

## 2020-04-02 ENCOUNTER — Other Ambulatory Visit (INDEPENDENT_AMBULATORY_CARE_PROVIDER_SITE_OTHER): Payer: Self-pay | Admitting: Family Medicine

## 2020-04-02 DIAGNOSIS — R7303 Prediabetes: Secondary | ICD-10-CM

## 2020-04-03 ENCOUNTER — Encounter (INDEPENDENT_AMBULATORY_CARE_PROVIDER_SITE_OTHER): Payer: Self-pay

## 2020-04-03 DIAGNOSIS — E039 Hypothyroidism, unspecified: Secondary | ICD-10-CM | POA: Diagnosis not present

## 2020-04-03 NOTE — Telephone Encounter (Signed)
MyChart message sent to pt to find out if they have enough medication to get them through until next appt.   

## 2020-04-04 ENCOUNTER — Other Ambulatory Visit: Payer: Self-pay

## 2020-04-04 ENCOUNTER — Other Ambulatory Visit (HOSPITAL_COMMUNITY)
Admission: RE | Admit: 2020-04-04 | Discharge: 2020-04-04 | Disposition: A | Discharge status: Home / Self Care | Payer: Medicare HMO | Source: Ambulatory Visit | Attending: Orthopedic Surgery | Admitting: Orthopedic Surgery

## 2020-04-04 ENCOUNTER — Encounter (HOSPITAL_COMMUNITY): Payer: Self-pay

## 2020-04-04 ENCOUNTER — Encounter (HOSPITAL_COMMUNITY)
Admission: RE | Admit: 2020-04-04 | Discharge: 2020-04-04 | Disposition: A | Discharge status: Home / Self Care | Payer: Medicare HMO | Source: Ambulatory Visit | Attending: Orthopedic Surgery | Admitting: Orthopedic Surgery

## 2020-04-04 DIAGNOSIS — Z20822 Contact with and (suspected) exposure to covid-19: Secondary | ICD-10-CM | POA: Insufficient documentation

## 2020-04-04 DIAGNOSIS — Z01812 Encounter for preprocedural laboratory examination: Secondary | ICD-10-CM | POA: Insufficient documentation

## 2020-04-04 LAB — CBC
HCT: 40.9 % (ref 36.0–46.0)
Hemoglobin: 12.9 g/dL (ref 12.0–15.0)
MCH: 31.2 pg (ref 26.0–34.0)
MCHC: 31.5 g/dL (ref 30.0–36.0)
MCV: 98.8 fL (ref 80.0–100.0)
Platelets: 268 10*3/uL (ref 150–400)
RBC: 4.14 MIL/uL (ref 3.87–5.11)
RDW: 13.8 % (ref 11.5–15.5)
WBC: 6.2 10*3/uL (ref 4.0–10.5)
nRBC: 0 % (ref 0.0–0.2)

## 2020-04-04 LAB — BASIC METABOLIC PANEL
Anion gap: 8 (ref 5–15)
BUN: 31 mg/dL — ABNORMAL HIGH (ref 8–23)
CO2: 24 mmol/L (ref 22–32)
Calcium: 9.6 mg/dL (ref 8.9–10.3)
Chloride: 107 mmol/L (ref 98–111)
Creatinine, Ser: 0.97 mg/dL (ref 0.44–1.00)
GFR, Estimated: >60 mL/min (ref >60)
Glucose, Bld: 100 mg/dL — ABNORMAL HIGH (ref 70–99)
Potassium: 4.4 mmol/L (ref 3.5–5.1)
Sodium: 139 mmol/L (ref 135–145)

## 2020-04-04 LAB — SURGICAL PCR SCREEN
MRSA, PCR: NEGATIVE
Staphylococcus aureus: NEGATIVE

## 2020-04-04 LAB — SARS CORONAVIRUS 2 (TAT 6-24 HRS): SARS Coronavirus 2: NEGATIVE

## 2020-04-06 NOTE — Anesthesia Preprocedure Evaluation (Signed)
Anesthesia Evaluation    Reviewed: Allergy & Precautions, Patient's Chart, lab work & pertinent test results  Airway Mallampati: II  TM Distance: >3 FB Neck ROM: Full    Dental no notable dental hx. (+) Teeth Intact   Pulmonary neg pulmonary ROS, former smoker,    Pulmonary exam normal breath sounds clear to auscultation       Cardiovascular hypertension, Normal cardiovascular exam Rhythm:Regular Rate:Normal     Neuro/Psych  Headaches, PSYCHIATRIC DISORDERS Anxiety Depression    GI/Hepatic Neg liver ROS, hiatal hernia, PUD, GERD  Medicated,  Endo/Other  Hypothyroidism obesity  Renal/GU negative Renal ROS  negative genitourinary   Musculoskeletal  (+) Arthritis ,   Abdominal   Peds negative pediatric ROS (+)  Hematology negative hematology ROS (+)   Anesthesia Other Findings   Reproductive/Obstetrics negative OB ROS                             Anesthesia Physical  Anesthesia Plan  ASA: III  Anesthesia Plan: General   Post-op Pain Management:  Regional for Post-op pain   Induction: Intravenous  PONV Risk Score and Plan: 3 and Ondansetron, Dexamethasone, Midazolam and Treatment may vary due to age or medical condition  Airway Management Planned: Oral ETT  Additional Equipment:   Intra-op Plan:   Post-operative Plan: Extubation in OR  Informed Consent: I have reviewed the patients History and Physical, chart, labs and discussed the procedure including the risks, benefits and alternatives for the proposed anesthesia with the patient or authorized representative who has indicated his/her understanding and acceptance.     Dental advisory given  Plan Discussed with: CRNA  Anesthesia Plan Comments: (Exparel + 0.25% bupivacaine interscalene block. GETA. )        Anesthesia Quick Evaluation

## 2020-04-07 ENCOUNTER — Encounter (HOSPITAL_COMMUNITY)
Admission: RE | Disposition: A | Discharge status: Home / Self Care | Payer: Self-pay | Source: Home / Self Care | Attending: Orthopedic Surgery

## 2020-04-07 ENCOUNTER — Encounter (HOSPITAL_COMMUNITY): Payer: Self-pay | Admitting: Orthopedic Surgery

## 2020-04-07 ENCOUNTER — Ambulatory Visit (HOSPITAL_COMMUNITY): Payer: Medicare HMO | Admitting: Anesthesiology

## 2020-04-07 ENCOUNTER — Ambulatory Visit (HOSPITAL_COMMUNITY)
Admission: RE | Admit: 2020-04-07 | Discharge: 2020-04-07 | Disposition: A | Discharge status: Home / Self Care | Payer: Medicare HMO | Attending: Orthopedic Surgery | Admitting: Orthopedic Surgery

## 2020-04-07 ENCOUNTER — Other Ambulatory Visit (INDEPENDENT_AMBULATORY_CARE_PROVIDER_SITE_OTHER): Payer: Self-pay | Admitting: Family Medicine

## 2020-04-07 DIAGNOSIS — Z79899 Other long term (current) drug therapy: Secondary | ICD-10-CM | POA: Insufficient documentation

## 2020-04-07 DIAGNOSIS — Z87891 Personal history of nicotine dependence: Secondary | ICD-10-CM | POA: Insufficient documentation

## 2020-04-07 DIAGNOSIS — Z791 Long term (current) use of non-steroidal anti-inflammatories (NSAID): Secondary | ICD-10-CM | POA: Diagnosis not present

## 2020-04-07 DIAGNOSIS — M19012 Primary osteoarthritis, left shoulder: Secondary | ICD-10-CM | POA: Insufficient documentation

## 2020-04-07 DIAGNOSIS — Z882 Allergy status to sulfonamides status: Secondary | ICD-10-CM | POA: Insufficient documentation

## 2020-04-07 DIAGNOSIS — I10 Essential (primary) hypertension: Secondary | ICD-10-CM | POA: Diagnosis not present

## 2020-04-07 DIAGNOSIS — G8918 Other acute postprocedural pain: Secondary | ICD-10-CM | POA: Diagnosis not present

## 2020-04-07 DIAGNOSIS — Z7984 Long term (current) use of oral hypoglycemic drugs: Secondary | ICD-10-CM | POA: Diagnosis not present

## 2020-04-07 DIAGNOSIS — M75102 Unspecified rotator cuff tear or rupture of left shoulder, not specified as traumatic: Secondary | ICD-10-CM | POA: Diagnosis not present

## 2020-04-07 DIAGNOSIS — F3289 Other specified depressive episodes: Secondary | ICD-10-CM

## 2020-04-07 DIAGNOSIS — Z96612 Presence of left artificial shoulder joint: Secondary | ICD-10-CM

## 2020-04-07 DIAGNOSIS — Z881 Allergy status to other antibiotic agents status: Secondary | ICD-10-CM | POA: Diagnosis not present

## 2020-04-07 HISTORY — PX: REVERSE SHOULDER ARTHROPLASTY: SHX5054

## 2020-04-07 SURGERY — ARTHROPLASTY, SHOULDER, TOTAL, REVERSE
Anesthesia: General | Site: Shoulder | Laterality: Left

## 2020-04-07 MED ORDER — BUPIVACAINE HCL (PF) 0.5 % IJ SOLN
INTRAMUSCULAR | Status: DC | PRN
  Administered 2020-04-07: 20 mL

## 2020-04-07 MED ORDER — SODIUM CHLORIDE 0.9 % IR SOLN
Status: DC | PRN
  Administered 2020-04-07: 1000 mL

## 2020-04-07 MED ORDER — PROPOFOL 10 MG/ML IV BOLUS
INTRAVENOUS | Status: DC | PRN
  Administered 2020-04-07: 150 mg via INTRAVENOUS

## 2020-04-07 MED ORDER — ONDANSETRON HCL 4 MG PO TABS: 4.0000 mg | ORAL_TABLET | Freq: Three times a day (TID) | ORAL | 1 refills | Status: DC | PRN

## 2020-04-07 MED ORDER — DEXAMETHASONE SODIUM PHOSPHATE 10 MG/ML IJ SOLN
INTRAMUSCULAR | Status: AC
  Filled 2020-04-07: qty 1

## 2020-04-07 MED ORDER — BUPIVACAINE-EPINEPHRINE (PF) 0.25% -1:200000 IJ SOLN
INTRAMUSCULAR | Status: AC
  Filled 2020-04-07: qty 30

## 2020-04-07 MED ORDER — SUGAMMADEX SODIUM 200 MG/2ML IV SOLN
INTRAVENOUS | Status: DC | PRN
  Administered 2020-04-07: 300 mg via INTRAVENOUS

## 2020-04-07 MED ORDER — OXYCODONE HCL 5 MG PO TABS
5.0000 mg | ORAL_TABLET | Freq: Once | ORAL | Status: AC | PRN
  Administered 2020-04-07: 5 mg via ORAL

## 2020-04-07 MED ORDER — FENTANYL CITRATE (PF) 100 MCG/2ML IJ SOLN
INTRAMUSCULAR | Status: AC
  Filled 2020-04-07: qty 2

## 2020-04-07 MED ORDER — CHLORHEXIDINE GLUCONATE 0.12 % MT SOLN
15.0000 mL | Freq: Once | OROMUCOSAL | Status: AC
  Administered 2020-04-07: 15 mL via OROMUCOSAL

## 2020-04-07 MED ORDER — ORAL CARE MOUTH RINSE: 15.0000 mL | Freq: Once | OROMUCOSAL | Status: AC

## 2020-04-07 MED ORDER — METHOCARBAMOL 500 MG PO TABS: 500.0000 mg | ORAL_TABLET | Freq: Three times a day (TID) | ORAL | 1 refills | Status: DC | PRN

## 2020-04-07 MED ORDER — CLINDAMYCIN PHOSPHATE 900 MG/50ML IV SOLN
900.0000 mg | INTRAVENOUS | Status: AC
  Administered 2020-04-07: 900 mg via INTRAVENOUS
  Filled 2020-04-07: qty 50

## 2020-04-07 MED ORDER — MIDAZOLAM HCL 2 MG/2ML IJ SOLN
INTRAMUSCULAR | Status: AC
  Filled 2020-04-07: qty 2

## 2020-04-07 MED ORDER — ONDANSETRON HCL 4 MG/2ML IJ SOLN
INTRAMUSCULAR | Status: AC
  Filled 2020-04-07: qty 2

## 2020-04-07 MED ORDER — BUPIVACAINE-EPINEPHRINE (PF) 0.25% -1:200000 IJ SOLN
INTRAMUSCULAR | Status: DC | PRN
  Administered 2020-04-07: 15 mL via PERINEURAL

## 2020-04-07 MED ORDER — PROMETHAZINE HCL 25 MG/ML IJ SOLN: 6.2500 mg | INTRAMUSCULAR | Status: DC | PRN

## 2020-04-07 MED ORDER — EPHEDRINE SULFATE-NACL 50-0.9 MG/10ML-% IV SOSY
PREFILLED_SYRINGE | INTRAVENOUS | Status: DC | PRN
  Administered 2020-04-07 (×3): 10 mg via INTRAVENOUS

## 2020-04-07 MED ORDER — PHENYLEPHRINE HCL-NACL 10-0.9 MG/250ML-% IV SOLN
INTRAVENOUS | Status: DC | PRN
  Administered 2020-04-07: 50 ug/min via INTRAVENOUS

## 2020-04-07 MED ORDER — OXYCODONE HCL 5 MG/5ML PO SOLN: 5.0000 mg | Freq: Once | ORAL | Status: AC | PRN

## 2020-04-07 MED ORDER — MIDAZOLAM HCL 5 MG/5ML IJ SOLN
INTRAMUSCULAR | Status: DC | PRN
  Administered 2020-04-07: 2 mg via INTRAVENOUS

## 2020-04-07 MED ORDER — ROCURONIUM BROMIDE 10 MG/ML (PF) SYRINGE
PREFILLED_SYRINGE | INTRAVENOUS | Status: AC
  Filled 2020-04-07: qty 10

## 2020-04-07 MED ORDER — AMISULPRIDE (ANTIEMETIC) 5 MG/2ML IV SOLN: 10.0000 mg | Freq: Once | INTRAVENOUS | Status: DC | PRN

## 2020-04-07 MED ORDER — DEXAMETHASONE SODIUM PHOSPHATE 10 MG/ML IJ SOLN
INTRAMUSCULAR | Status: DC | PRN
  Administered 2020-04-07: 10 mg via INTRAVENOUS

## 2020-04-07 MED ORDER — FENTANYL CITRATE (PF) 250 MCG/5ML IJ SOLN
INTRAMUSCULAR | Status: DC | PRN
  Administered 2020-04-07 (×2): 50 ug via INTRAVENOUS

## 2020-04-07 MED ORDER — OXYCODONE HCL 5 MG PO TABS
ORAL_TABLET | ORAL | Status: AC
  Filled 2020-04-07: qty 1

## 2020-04-07 MED ORDER — PHENYLEPHRINE HCL (PRESSORS) 10 MG/ML IV SOLN
INTRAVENOUS | Status: AC
  Filled 2020-04-07: qty 1

## 2020-04-07 MED ORDER — FENTANYL CITRATE (PF) 100 MCG/2ML IJ SOLN: 25.0000 ug | INTRAMUSCULAR | Status: DC | PRN

## 2020-04-07 MED ORDER — LIDOCAINE HCL (PF) 2 % IJ SOLN
INTRAMUSCULAR | Status: AC
  Filled 2020-04-07: qty 5

## 2020-04-07 MED ORDER — PHENYLEPHRINE 40 MCG/ML (10ML) SYRINGE FOR IV PUSH (FOR BLOOD PRESSURE SUPPORT)
PREFILLED_SYRINGE | INTRAVENOUS | Status: DC | PRN
  Administered 2020-04-07: 40 ug via INTRAVENOUS
  Administered 2020-04-07: 80 ug via INTRAVENOUS

## 2020-04-07 MED ORDER — LACTATED RINGERS IV SOLN: INTRAVENOUS | Status: DC

## 2020-04-07 MED ORDER — PROPOFOL 10 MG/ML IV BOLUS
INTRAVENOUS | Status: AC
  Filled 2020-04-07: qty 20

## 2020-04-07 MED ORDER — BUPIVACAINE LIPOSOME 1.3 % IJ SUSP
INTRAMUSCULAR | Status: DC | PRN
  Administered 2020-04-07: 10 mL via PERINEURAL

## 2020-04-07 MED ORDER — ONDANSETRON HCL 4 MG/2ML IJ SOLN
INTRAMUSCULAR | Status: DC | PRN
Start: 2020-04-07 — End: 2020-04-07
  Administered 2020-04-07: 4 mg via INTRAVENOUS

## 2020-04-07 MED ORDER — LACTATED RINGERS IV SOLN: INTRAVENOUS | Status: DC | PRN

## 2020-04-07 MED ORDER — ROCURONIUM BROMIDE 10 MG/ML (PF) SYRINGE
PREFILLED_SYRINGE | INTRAVENOUS | Status: DC | PRN
  Administered 2020-04-07: 80 mg via INTRAVENOUS

## 2020-04-07 MED ORDER — ACETAMINOPHEN 500 MG PO TABS
1000.0000 mg | ORAL_TABLET | Freq: Once | ORAL | Status: AC
  Administered 2020-04-07: 1000 mg via ORAL
  Filled 2020-04-07: qty 2

## 2020-04-07 MED ORDER — OXYCODONE-ACETAMINOPHEN 5-325 MG PO TABS: 1.0000 | ORAL_TABLET | ORAL | 0 refills | Status: DC | PRN

## 2020-04-07 MED ORDER — LIDOCAINE 2% (20 MG/ML) 5 ML SYRINGE
INTRAMUSCULAR | Status: DC | PRN
  Administered 2020-04-07: 80 mg via INTRAVENOUS

## 2020-04-07 SURGICAL SUPPLY — 75 items
AID PSTN UNV HD RSTRNT DISP (MISCELLANEOUS) ×1
BAG SPEC THK2 15X12 ZIP CLS (MISCELLANEOUS)
BAG ZIPLOCK 12X15 (MISCELLANEOUS) IMPLANT
BIT DRILL 1.6MX128 (BIT) IMPLANT
BIT DRILL 170X2.5X (BIT) IMPLANT
BIT DRL 170X2.5X (BIT) ×1
BLADE SAG 18X100X1.27 (BLADE) ×2 IMPLANT
COVER BACK TABLE 60X90IN (DRAPES) ×2 IMPLANT
COVER SURGICAL LIGHT HANDLE (MISCELLANEOUS) ×2 IMPLANT
COVER WAND RF STERILE (DRAPES) IMPLANT
CUP D38 DXTEND STAND PLUS 6 HU (Orthopedic Implant) ×2 IMPLANT
CUP STD D38 DXTEND PLUS 6 HU (Orthopedic Implant) IMPLANT
DECANTER SPIKE VIAL GLASS SM (MISCELLANEOUS) ×2 IMPLANT
DRAPE INCISE IOBAN 66X45 STRL (DRAPES) ×2 IMPLANT
DRAPE ORTHO SPLIT 77X108 STRL (DRAPES) ×4
DRAPE SHEET LG 3/4 BI-LAMINATE (DRAPES) ×2 IMPLANT
DRAPE SURG ORHT 6 SPLT 77X108 (DRAPES) ×2 IMPLANT
DRAPE TOP 10253 STERILE (DRAPES) ×2 IMPLANT
DRAPE U-SHAPE 47X51 STRL (DRAPES) ×2 IMPLANT
DRILL 2.5 (BIT) ×2
DRSG ADAPTIC 3X8 NADH LF (GAUZE/BANDAGES/DRESSINGS) ×2 IMPLANT
DRSG PAD ABDOMINAL 8X10 ST (GAUZE/BANDAGES/DRESSINGS) ×2 IMPLANT
DURAPREP 26ML APPLICATOR (WOUND CARE) ×2 IMPLANT
ELECT BLADE TIP CTD 4 INCH (ELECTRODE) ×2 IMPLANT
ELECT NDL TIP 2.8 STRL (NEEDLE) ×1 IMPLANT
ELECT NEEDLE TIP 2.8 STRL (NEEDLE) ×2 IMPLANT
ELECT REM PT RETURN 15FT ADLT (MISCELLANEOUS) ×2 IMPLANT
EPI LT SZ 1 (Orthopedic Implant) ×2 IMPLANT
EPIPHYSIS LT SZ 1 (Orthopedic Implant) IMPLANT
FACESHIELD WRAPAROUND (MASK) ×2 IMPLANT
FACESHIELD WRAPAROUND OR TEAM (MASK) ×1 IMPLANT
GAUZE SPONGE 4X4 12PLY STRL (GAUZE/BANDAGES/DRESSINGS) ×2 IMPLANT
GLENOSPHERE DELTA XTEND LAT 38 (Miscellaneous) ×1 IMPLANT
GLOVE BIOGEL PI ORTHO PRO 7.5 (GLOVE) ×1
GLOVE BIOGEL PI ORTHO PRO SZ8 (GLOVE) ×1
GLOVE ORTHO TXT STRL SZ7.5 (GLOVE) ×2 IMPLANT
GLOVE PI ORTHO PRO STRL 7.5 (GLOVE) ×1 IMPLANT
GLOVE PI ORTHO PRO STRL SZ8 (GLOVE) ×1 IMPLANT
GLOVE SURG ORTHO 8.5 STRL (GLOVE) ×2 IMPLANT
GOWN STRL REUS W/TWL XL LVL3 (GOWN DISPOSABLE) ×4 IMPLANT
KIT BASIN OR (CUSTOM PROCEDURE TRAY) ×2 IMPLANT
KIT TURNOVER KIT A (KITS) IMPLANT
MANIFOLD NEPTUNE II (INSTRUMENTS) ×2 IMPLANT
METAGLENE DELTA EXTEND (Trauma) IMPLANT
METAGLENE DXTEND (Trauma) ×2 IMPLANT
NDL MAYO CATGUT SZ4 TPR NDL (NEEDLE) IMPLANT
NEEDLE MAYO CATGUT SZ4 (NEEDLE) IMPLANT
NS IRRIG 1000ML POUR BTL (IV SOLUTION) ×2 IMPLANT
PACK SHOULDER (CUSTOM PROCEDURE TRAY) ×2 IMPLANT
PENCIL SMOKE EVACUATOR (MISCELLANEOUS) IMPLANT
PIN GUIDE 1.2 (PIN) ×1 IMPLANT
PIN METAGLENE 2.5 (PIN) ×2 IMPLANT
PIN STEINMAN FIXATION KNEE (PIN) ×1 IMPLANT
PROTECTOR NERVE ULNAR (MISCELLANEOUS) ×2 IMPLANT
RESTRAINT HEAD UNIVERSAL NS (MISCELLANEOUS) ×2 IMPLANT
SCREW LOCK DELTA XTEND 4.5X30 (Screw) ×2 IMPLANT
SLING ARM FOAM STRAP LRG (SOFTGOODS) ×1 IMPLANT
SMARTMIX MINI TOWER (MISCELLANEOUS)
SPONGE LAP 4X18 RFD (DISPOSABLE) IMPLANT
STEM HUMERAL SZ8 STANDARD (Stem) ×2 IMPLANT
STEM HUMERAL SZ8 STD (Stem) IMPLANT
STRIP CLOSURE SKIN 1/2X4 (GAUZE/BANDAGES/DRESSINGS) ×2 IMPLANT
SUCTION FRAZIER HANDLE 10FR (MISCELLANEOUS) ×2
SUCTION TUBE FRAZIER 10FR DISP (MISCELLANEOUS) ×1 IMPLANT
SUT FIBERWIRE #2 38 T-5 BLUE (SUTURE) ×4
SUT MNCRL AB 4-0 PS2 18 (SUTURE) ×2 IMPLANT
SUT VIC AB 0 CT1 36 (SUTURE) ×4 IMPLANT
SUT VIC AB 0 CT2 27 (SUTURE) ×2 IMPLANT
SUT VIC AB 2-0 CT1 27 (SUTURE) ×2
SUT VIC AB 2-0 CT1 TAPERPNT 27 (SUTURE) ×1 IMPLANT
SUTURE FIBERWR #2 38 T-5 BLUE (SUTURE) ×2 IMPLANT
TAPE PAPER 1X10 WHT MICROPORE (GAUZE/BANDAGES/DRESSINGS) ×1 IMPLANT
TAPE STRIPS DRAPE STRL (GAUZE/BANDAGES/DRESSINGS) ×1 IMPLANT
TOWEL OR 17X26 10 PK STRL BLUE (TOWEL DISPOSABLE) ×2 IMPLANT
TOWER SMARTMIX MINI (MISCELLANEOUS) IMPLANT

## 2020-04-07 NOTE — Anesthesia Procedure Notes (Signed)
Procedure Name: Intubation Date/Time: 04/07/2020 7:46 AM Performed by: Mitzie Na, CRNA Pre-anesthesia Checklist: Patient identified, Emergency Drugs available, Suction available and Patient being monitored Patient Re-evaluated:Patient Re-evaluated prior to induction Oxygen Delivery Method: Circle system utilized Preoxygenation: Pre-oxygenation with 100% oxygen Induction Type: IV induction Ventilation: Mask ventilation without difficulty Laryngoscope Size: Mac and 3 Grade View: Grade I Tube type: Oral Tube size: 7.0 mm Number of attempts: 1 Airway Equipment and Method: Stylet Placement Confirmation: ETT inserted through vocal cords under direct vision,  positive ETCO2 and breath sounds checked- equal and bilateral Secured at: 24 cm Tube secured with: Tape Dental Injury: Teeth and Oropharynx as per pre-operative assessment

## 2020-04-07 NOTE — Evaluation (Signed)
Occupational Therapy Evaluation Patient Details Name: Melissa Leach MRN: [id number] DOB: [birth year] Today's Date: 04/07/2020    History of Present Illness s/p left reverse shoulder   Clinical Impression   Melissa Leach is a 70 year old woman s/p shoulder replacement without functional use of left non-dominant upper extremity secondary to effects of surgery and interscalene block and shoulder precautions. Therapist provided education and instruction to patient and caregiver in regards to exercises, precautions, positioning, donning upper extremity clothing and bathing while maintaining shoulder precautions, ice and edema management and donning/doffing sling. Patient and caregiver verbalized understanding and demonstrated as needed. Patient needed assistance to donn shirt and sling and pull clothing up and provided with suggestions for compensatory strategies to perform ADLs. Patient provided with hand outs to maximize retention of education. Patient verbalized understanding of all instructions. Patient to follow up with MD for further therapy needs.      Follow Up Recommendations  Follow surgeon's recommendation for DC plan and follow-up therapies    Equipment Recommendations  None recommended by OT    Recommendations for Other Services       Precautions / Restrictions Precautions Precautions: Shoulder Type of Shoulder Precautions: Conservative Protocol Shoulder Interventions: Shoulder sling/immobilizer;Off for dressing/bathing/exercises;At all times Precaution Booklet Issued:  (handout) Required Braces or Orthoses: Sling Restrictions Weight Bearing Restrictions: Yes LUE Weight Bearing: Non weight bearing      Mobility Bed Mobility                    Transfers                 General transfer comment: Min guard for standing    Balance                                           ADL either performed or assessed with clinical judgement    ADL Overall ADL's : Needs assistance/impaired Eating/Feeding: Modified independent   Grooming: Modified independent           Upper Body Dressing : Maximal assistance;Sitting Upper Body Dressing Details (indicate cue type and reason): to don on LUE and button shirt Lower Body Dressing: Minimal assistance;Sit to/from stand Lower Body Dressing Details (indicate cue type and reason): to pull clothing up over left hip Toilet Transfer: Min guard   Toileting- Clothing Manipulation and Hygiene: Minimal assistance;Sit to/from stand               Vision Patient Visual Report: No change from baseline       Perception     Praxis      Pertinent Vitals/Pain Pain Assessment: No/denies pain     Hand Dominance Right   Extremity/Trunk Assessment Upper Extremity Assessment Upper Extremity Assessment: LUE deficits/detail LUE Deficits / Details: No functional AROM at this time due to interscalene block   Lower Extremity Assessment Lower Extremity Assessment: Overall WFL for tasks assessed   Cervical / Trunk Assessment Cervical / Trunk Assessment: Normal   Communication Communication Communication: No difficulties   Cognition Arousal/Alertness: Awake/alert Behavior During Therapy: WFL for tasks assessed/performed Overall Cognitive Status: Within Functional Limits for tasks assessed                                     General Comments  Exercises     Shoulder Instructions Shoulder Instructions Donning/doffing shirt without moving shoulder: Patient able to independently direct caregiver;Maximal assistance Method for sponge bathing under operated UE: Independent Donning/doffing sling/immobilizer: Moderate assistance Correct positioning of sling/immobilizer: Independent ROM for elbow, wrist and digits of operated UE: Independent Sling wearing schedule (on at all times/off for ADL's): Independent Proper positioning of operated UE when showering:  Independent Dressing change: Independent Positioning of UE while sleeping: Independent    Home Living Family/patient expects to be discharged to:: Private residence Living Arrangements: Alone Available Help at Discharge: Family                                    Prior Functioning/Environment Level of Independence: Independent                 OT Problem List: Decreased strength;Decreased range of motion;Impaired UE functional use;Impaired sensation      OT Treatment/Interventions:      OT Goals(Current goals can be found in the care plan section) Acute Rehab OT Goals OT Goal Formulation: All assessment and education complete, DC therapy  OT Frequency:     Barriers to D/C:            Co-evaluation              AM-PAC OT "6 Clicks" Daily Activity     Outcome Measure Help from another person eating meals?: A Little Help from another person taking care of personal grooming?: None Help from another person toileting, which includes using toliet, bedpan, or urinal?: None Help from another person bathing (including washing, rinsing, drying)?: A Little Help from another person to put on and taking off regular upper body clothing?: A Lot Help from another person to put on and taking off regular lower body clothing?: A Little 6 Click Score: 19   End of Session Nurse Communication:  (OT education complete)  Activity Tolerance: Patient tolerated treatment well Patient left: in chair  OT Visit Diagnosis: Muscle weakness (generalized) (M62.81)                Time: 7494-4967 OT Time Calculation (min): 21 min Charges:  OT General Charges $OT Visit: 1 Visit OT Evaluation $OT Eval Low Complexity: 1 Low  Breton Berns, OTR/L Acute Care Rehab Services  Office [phone] Pager: [phone]   Kelli Churn 04/07/2020, 12:35 PM

## 2020-04-07 NOTE — Transfer of Care (Signed)
Immediate Anesthesia Transfer of Care Note  Patient: Melissa Leach  Procedure(s) Performed: REVERSE SHOULDER ARTHROPLASTY (Left Shoulder)  Patient Location: PACU  Anesthesia Type:General  Level of Consciousness: awake, alert , oriented and patient cooperative  Airway & Oxygen Therapy: Patient Spontanous Breathing and Patient connected to face mask oxygen  Post-op Assessment: Report given to RN and Post -op Vital signs reviewed and stable  Post vital signs: Reviewed and stable  Last Vitals:  Vitals Value Taken Time  BP 107/39 04/07/20 0939  Temp    Pulse 91 04/07/20 0940  Resp 26 04/07/20 0940  SpO2 100 % 04/07/20 0940  Vitals shown include unvalidated device data.  Last Pain:  Vitals:   04/07/20 0600  TempSrc: Oral         Complications: No complications documented.

## 2020-04-07 NOTE — Brief Op Note (Signed)
04/07/2020  9:35 AM  PATIENT:  Melissa Leach  70 y.o. female  PRE-OPERATIVE DIAGNOSIS:  Left shoulder osteoarthritis rotator cuff insufficiency  POST-OPERATIVE DIAGNOSIS:  Left shoulder osteoarthritis rotator cuff insufficiency  PROCEDURE:  Procedure(s): REVERSE SHOULDER ARTHROPLASTY (Left) Depuy Delta Xtend with NO subscap repair  SURGEON:  Surgeon(s) and Role:    Beverely Low, MD - Primary  PHYSICIAN ASSISTANT:   ASSISTANTS: Thea Gist, PA-C   ANESTHESIA:   regional and general  EBL:  150 mL   BLOOD ADMINISTERED:none  DRAINS: none   LOCAL MEDICATIONS USED:  MARCAINE     SPECIMEN:  No Specimen  DISPOSITION OF SPECIMEN:  N/A  COUNTS:  YES  TOURNIQUET:  * No tourniquets in log *  DICTATION: .Other Dictation: Dictation Number [id number]  PLAN OF CARE: Discharge to home after PACU  PATIENT DISPOSITION:  PACU - hemodynamically stable.   Delay start of Pharmacological VTE agent (>24hrs) due to surgical blood loss or risk of bleeding: not applicable

## 2020-04-07 NOTE — Op Note (Signed)
NAMEAUNISTY, REALI MEDICAL RECORD OI:[id number] ACCOUNT [id number] DATE OF BIRTH:September 28, 1950 FACILITY: [facility] LOCATION: [facility] PHYSICIAN:STEVEN Russ Halo, MD  OPERATIVE REPORT  DATE OF PROCEDURE:  04/07/2020  PREOPERATIVE DIAGNOSIS:  Left shoulder rotator cuff tear arthropathy.  POSTOPERATIVE DIAGNOSIS:  Left shoulder rotator cuff tear arthropathy.  PROCEDURE PERFORMED:  Left reverse total shoulder replacement using DePuy Delta Xtend prosthesis with no subscap repair.  ATTENDING SURGEON:  Malon Kindle, MD  ASSISTANT:  Modesto Charon, [place], who was scrubbed during the entire procedure and necessary for satisfactory completion of surgery.  ANESTHESIA:  General anesthesia plus interscalene block anesthesia was used.  ESTIMATED BLOOD LOSS:  150 mL.  FLUID REPLACEMENT:  1500 mL crystalloid.  INSTRUMENT COUNT:  Correct.  COMPLICATIONS:  There were no complications.  ANTIBIOTICS:  Perioperative antibiotics were given.  INDICATIONS:  The patient is a 70 year old female with worsening left shoulder pain and dysfunction secondary to rotator cuff tear arthropathy.  The patient has failed an extended period of conservative management and presents for operative treatment to  restore function and eliminate pain.  Informed consent obtained.  DESCRIPTION OF PROCEDURE:  After an adequate level of anesthesia was achieved, the patient was positioned in the modified beach chair position.  Left shoulder correctly identified and sterilely prepped and draped in the usual manner.  Timeout called,  verifying correct patient, correct site.  We entered the patient's shoulder using a standard deltopectoral incision, started at the coracoid process extending down to the anterior humerus.  Dissection down through subcutaneous tissues using Bovie  electrocautery.  Cephalic vein was identified and taken laterally with the deltoid.  Pectoralis was taken medially.  Conjoined tendon identified and  retracted medially.  We tenodesed the biceps in situ with 0 Vicryl figure-of-eight suture x2.  We  released the subscapularis remnant off the lesser tuberosity and tagged with #2 FiberWire suture to retract and protect the axillary nerve.  We then released the inferior capsule extending the shoulder and delivering the humeral head out of the wound.   The anterior remaining supraspinatus and some of the infraspinatus was released and resected.  The posterior infraspinatus and teres minor was preserved.  At this point, we entered the humeral head, was devoid of any cartilage.  We entered the proximal  humerus with a 6 mm reamer and had to use power to ream up to a size 8.  Her canal tapered significantly.  We then cut the head using the intramedullary resection guide on 10 degrees of retroversion with the oscillating saw.  We removed excess  osteophytes with a rongeur.  We then reduced the shoulder back into the wound and retracted the shoulder posteriorly.  We were able to get 360 degree exposure of the glenoid face, which was again devoid of cartilage.  There was quite a bit of synovitis  in the shoulder joint.  The labrum was removed.  Capsule resected.  We were careful to protect the axillary nerve.  Deep retractors were placed.  We identified our guide pin starting point and used the metaglene baseplate targeting guide for our pin.   Once we had our pin in place, we reamed for the baseplate getting down to subchondral bone.  We then drilled out our central peg hole, impacted the metaglene baseplate into position.  We placed inferior and superior screws only.  Her bone was extremely  hard.  It was actually difficult to seat her metaglene baseplate all the way down and there was maybe a 0.5 mm anteriorly  and posteriorly where it was off the bone slightly, but we had good apposition around the central peg and up along the central  portion of the baseplate.  We had good security with our superior and inferior  size 30 screws.  Those were locked into position and then a 38 standard glenosphere was placed and screwed into position on the metaglene baseplate.  Once that was secured, I  did a finger sweep to make sure no soft tissue was incorporated in that bearing.  We then finished our preparation on the humeral side, reaming for the 1 left metaphysis.  We then placed the 8 Porocoat trial stem with the 1 left metaphysis set on the 0  setting and impacted in 10 degrees of retroversion.  We reduced with a 38+3 poly trial.  Felt like we could probably get the +6 in.  We then removed all trial components from the humeral side, irrigated thoroughly.  We used available bone graft from the  head in impaction grafting technique and impacted the Porocoat 8 with the 1 left HA coated metaphysis set in the 0 setting and impacted in 10 degrees of retroversion.  We had a nice secure stem.  Selected a 38+6 real poly and placed that on the tray and  impacted that.  We then reduced the shoulder, had nice little pop as it reduced and our stability was excellent.  Tiny bit of gapping with max extension and external rotation, but otherwise completely stable with good inferior pull.  Conjoined  appropriately tight and the axillary nerve was palpated, in good shape.  We irrigated again, resected the remnant of the subscap and then went ahead and repaired deltopectoral interval with 0 Vicryl suture followed by 2-0 Vicryl for subcutaneous closure  and 4-0 Monocryl for skin.  Steri-Strips applied followed by sterile dressing.  The patient tolerated surgery well.  HN/NUANCE  D:04/07/2020 T:04/07/2020 JOB:[id number]

## 2020-04-07 NOTE — Anesthesia Procedure Notes (Signed)
Anesthesia Regional Block: Interscalene brachial plexus block   Pre-Anesthetic Checklist: ,, timeout performed, Correct Patient, Correct Site, Correct Laterality, Correct Procedure, Correct Position, site marked, Risks and benefits discussed,  Surgical consent,  Pre-op evaluation,  At surgeon's request and post-op pain management  Laterality: Left  Prep: chloraprep       Needles:  Injection technique: Single-shot  Needle Type: Echogenic Stimulator Needle     Needle Length: 9cm  Needle Gauge: 20     Additional Needles:   Procedures:,,,, ultrasound used (permanent image in chart),,,,  Narrative:  Start time: 04/07/2020 7:00 AM End time: 04/07/2020 7:05 AM Injection made incrementally with aspirations every 5 mL.  Performed by: Personally  Anesthesiologist: Mellody Dance, MD  Additional Notes: Functioning IV was confirmed and monitors applied.  A 25mm 22ga echogenic arrow stimulator was used. Sterile prep and drape,hand hygiene and sterile gloves were used.Ultrasound guidance: relevant anatomy identified, needle position confirmed, local anesthetic spread visualized around nerve(s)., vascular puncture avoided.  Image printed for medical record.  Negative aspiration and negative test dose prior to incremental administration of local anesthetic. The patient tolerated the procedure well.

## 2020-04-07 NOTE — Discharge Instructions (Signed)
Ice to the shoulder constantly.  Keep the incision covered and clean and dry for one week, then ok to get it wet in the shower. ° °Do exercise as instructed several times per day. ° °DO NOT reach behind your back or push up out of a chair with the operative arm. ° °Use a sling while you are up and around for comfort, may remove while seated.  Keep pillow propped behind the operative elbow. ° °Follow up with Dr Rosary Filosa in two weeks in the office, call [phone] for appt °

## 2020-04-07 NOTE — Interval H&P Note (Signed)
History and Physical Interval Note:  04/07/2020 7:34 AM  Melissa Leach  has presented today for surgery, with the diagnosis of Left shoulder osteoarthritis rotator cuff insufficiency.  The various methods of treatment have been discussed with the patient and family. After consideration of risks, benefits and other options for treatment, the patient has consented to  Procedure(s): REVERSE SHOULDER ARTHROPLASTY (Left) as a surgical intervention.  The patient's history has been reviewed, patient examined, no change in status, stable for surgery.  I have reviewed the patient's chart and labs.  Questions were answered to the patient's satisfaction.     [name]

## 2020-04-07 NOTE — Anesthesia Postprocedure Evaluation (Signed)
Anesthesia Post Note  Patient: Melissa Leach  Procedure(s) Performed: REVERSE SHOULDER ARTHROPLASTY (Left Shoulder)     Patient location during evaluation: PACU Anesthesia Type: General and Regional Level of consciousness: awake Pain management: pain level controlled Vital Signs Assessment: post-procedure vital signs reviewed and stable Respiratory status: spontaneous breathing and respiratory function stable Cardiovascular status: stable Postop Assessment: no apparent nausea or vomiting Anesthetic complications: no   No complications documented.  Last Vitals:  Vitals:   04/07/20 1015 04/07/20 1030  BP: 116/75 115/69  Pulse: 89 81  Resp: (!) 24 (!) 24  Temp:    SpO2: 94% 93%    Last Pain:  Vitals:   04/07/20 1030  TempSrc:   PainSc: Asleep                 Mellody Dance

## 2020-04-10 ENCOUNTER — Encounter (INDEPENDENT_AMBULATORY_CARE_PROVIDER_SITE_OTHER): Payer: Self-pay

## 2020-04-10 ENCOUNTER — Encounter (HOSPITAL_COMMUNITY): Payer: Self-pay | Admitting: Orthopedic Surgery

## 2020-04-10 NOTE — Telephone Encounter (Signed)
MyChart message sent to pt to find out if they have enough medication to get them through until next appt.   

## 2020-04-20 DIAGNOSIS — Z96612 Presence of left artificial shoulder joint: Secondary | ICD-10-CM | POA: Diagnosis not present

## 2020-04-25 DIAGNOSIS — M25512 Pain in left shoulder: Secondary | ICD-10-CM | POA: Diagnosis not present

## 2020-04-26 ENCOUNTER — Other Ambulatory Visit (INDEPENDENT_AMBULATORY_CARE_PROVIDER_SITE_OTHER): Payer: Self-pay | Admitting: Family Medicine

## 2020-04-26 DIAGNOSIS — R7303 Prediabetes: Secondary | ICD-10-CM

## 2020-04-27 DIAGNOSIS — M25512 Pain in left shoulder: Secondary | ICD-10-CM | POA: Diagnosis not present

## 2020-05-03 ENCOUNTER — Ambulatory Visit (INDEPENDENT_AMBULATORY_CARE_PROVIDER_SITE_OTHER): Payer: Medicare HMO | Admitting: Family Medicine

## 2020-05-04 DIAGNOSIS — M25512 Pain in left shoulder: Secondary | ICD-10-CM | POA: Diagnosis not present

## 2020-05-08 ENCOUNTER — Encounter (INDEPENDENT_AMBULATORY_CARE_PROVIDER_SITE_OTHER): Payer: Self-pay

## 2020-05-08 DIAGNOSIS — E039 Hypothyroidism, unspecified: Secondary | ICD-10-CM | POA: Insufficient documentation

## 2020-05-09 DIAGNOSIS — M25512 Pain in left shoulder: Secondary | ICD-10-CM | POA: Diagnosis not present

## 2020-05-11 ENCOUNTER — Encounter (INDEPENDENT_AMBULATORY_CARE_PROVIDER_SITE_OTHER): Payer: Self-pay | Admitting: Family Medicine

## 2020-05-11 DIAGNOSIS — M25512 Pain in left shoulder: Secondary | ICD-10-CM | POA: Diagnosis not present

## 2020-05-11 NOTE — Telephone Encounter (Signed)
Dr Beasley pt 

## 2020-05-15 ENCOUNTER — Other Ambulatory Visit (INDEPENDENT_AMBULATORY_CARE_PROVIDER_SITE_OTHER): Payer: Self-pay

## 2020-05-15 DIAGNOSIS — R7303 Prediabetes: Secondary | ICD-10-CM

## 2020-05-15 DIAGNOSIS — E559 Vitamin D deficiency, unspecified: Secondary | ICD-10-CM

## 2020-05-15 MED ORDER — VITAMIN D (ERGOCALCIFEROL) 1.25 MG (50000 UNIT) PO CAPS: ORAL_CAPSULE | ORAL | 0 refills | Status: DC

## 2020-05-15 MED ORDER — METFORMIN HCL 500 MG PO TABS: ORAL_TABLET | ORAL | 0 refills | Status: DC

## 2020-05-15 NOTE — Telephone Encounter (Signed)
Ok to rf x 1

## 2020-05-16 DIAGNOSIS — M25512 Pain in left shoulder: Secondary | ICD-10-CM | POA: Diagnosis not present

## 2020-05-18 DIAGNOSIS — M25512 Pain in left shoulder: Secondary | ICD-10-CM | POA: Diagnosis not present

## 2020-05-23 DIAGNOSIS — M25512 Pain in left shoulder: Secondary | ICD-10-CM | POA: Diagnosis not present

## 2020-05-25 DIAGNOSIS — M25512 Pain in left shoulder: Secondary | ICD-10-CM | POA: Diagnosis not present

## 2020-05-29 ENCOUNTER — Encounter (INDEPENDENT_AMBULATORY_CARE_PROVIDER_SITE_OTHER): Payer: Self-pay | Admitting: Family Medicine

## 2020-05-29 ENCOUNTER — Ambulatory Visit (INDEPENDENT_AMBULATORY_CARE_PROVIDER_SITE_OTHER): Payer: Medicare HMO | Admitting: Family Medicine

## 2020-05-29 ENCOUNTER — Other Ambulatory Visit: Payer: Self-pay

## 2020-05-29 VITALS — BP 92/60 | HR 81 | Temp 97.6°F | Ht 61.0 in | Wt 177.0 lb

## 2020-05-29 DIAGNOSIS — Z1231 Encounter for screening mammogram for malignant neoplasm of breast: Secondary | ICD-10-CM | POA: Diagnosis not present

## 2020-05-29 DIAGNOSIS — Z6833 Body mass index (BMI) 33.0-33.9, adult: Secondary | ICD-10-CM

## 2020-05-29 DIAGNOSIS — R7303 Prediabetes: Secondary | ICD-10-CM

## 2020-05-29 DIAGNOSIS — E559 Vitamin D deficiency, unspecified: Secondary | ICD-10-CM | POA: Diagnosis not present

## 2020-05-29 DIAGNOSIS — E039 Hypothyroidism, unspecified: Secondary | ICD-10-CM

## 2020-05-29 DIAGNOSIS — F3289 Other specified depressive episodes: Secondary | ICD-10-CM

## 2020-05-29 DIAGNOSIS — R69 Illness, unspecified: Secondary | ICD-10-CM | POA: Diagnosis not present

## 2020-05-29 DIAGNOSIS — R5383 Other fatigue: Secondary | ICD-10-CM

## 2020-05-29 DIAGNOSIS — E7849 Other hyperlipidemia: Secondary | ICD-10-CM | POA: Diagnosis not present

## 2020-05-29 DIAGNOSIS — E669 Obesity, unspecified: Secondary | ICD-10-CM | POA: Diagnosis not present

## 2020-05-29 DIAGNOSIS — E66811 Obesity, class 1: Secondary | ICD-10-CM

## 2020-05-29 MED ORDER — VITAMIN D (ERGOCALCIFEROL) 1.25 MG (50000 UNIT) PO CAPS
ORAL_CAPSULE | ORAL | 0 refills | Status: DC
Start: 2020-05-29 — End: 2020-07-03

## 2020-05-29 MED ORDER — METFORMIN HCL 500 MG PO TABS
ORAL_TABLET | ORAL | 0 refills | Status: DC
Start: 2020-05-29 — End: 2020-07-03

## 2020-05-30 DIAGNOSIS — M25512 Pain in left shoulder: Secondary | ICD-10-CM | POA: Diagnosis not present

## 2020-05-30 LAB — COMPREHENSIVE METABOLIC PANEL
ALT: 16 IU/L (ref 0–32)
AST: 23 IU/L (ref 0–40)
Albumin/Globulin Ratio: 1.9 (ref 1.2–2.2)
Albumin: 4.4 g/dL (ref 3.8–4.8)
Alkaline Phosphatase: 98 IU/L (ref 44–121)
BUN/Creatinine Ratio: 24 (ref 12–28)
BUN: 20 mg/dL (ref 8–27)
Bilirubin Total: 0.3 mg/dL (ref 0.0–1.2)
CO2: 21 mmol/L (ref 20–29)
Calcium: 9.4 mg/dL (ref 8.7–10.3)
Chloride: 108 mmol/L — ABNORMAL HIGH (ref 96–106)
Creatinine, Ser: 0.82 mg/dL (ref 0.57–1.00)
Globulin, Total: 2.3 g/dL (ref 1.5–4.5)
Glucose: 92 mg/dL (ref 65–99)
Potassium: 4.8 mmol/L (ref 3.5–5.2)
Sodium: 142 mmol/L (ref 134–144)
Total Protein: 6.7 g/dL (ref 6.0–8.5)
eGFR: 78 mL/min/{1.73_m2} (ref >59)

## 2020-05-30 LAB — HEMOGLOBIN A1C
Est. average glucose Bld gHb Est-mCnc: 105 mg/dL
Hgb A1c MFr Bld: 5.3 % (ref 4.8–5.6)

## 2020-05-30 LAB — LIPID PANEL WITH LDL/HDL RATIO
Cholesterol, Total: 140 mg/dL (ref 100–199)
HDL: 41 mg/dL (ref >39)
LDL Chol Calc (NIH): 68 mg/dL (ref 0–99)
LDL/HDL Ratio: 1.7 ratio (ref 0.0–3.2)
Triglycerides: 186 mg/dL — ABNORMAL HIGH (ref 0–149)
VLDL Cholesterol Cal: 31 mg/dL (ref 5–40)

## 2020-05-30 LAB — VITAMIN D 25 HYDROXY (VIT D DEFICIENCY, FRACTURES): Vit D, 25-Hydroxy: 55.3 ng/mL (ref 30.0–100.0)

## 2020-05-30 LAB — TSH: TSH: 0.399 u[IU]/mL — ABNORMAL LOW (ref 0.450–4.500)

## 2020-05-30 LAB — INSULIN, RANDOM: INSULIN: 9.2 u[IU]/mL (ref 2.6–24.9)

## 2020-05-30 NOTE — Progress Notes (Signed)
Chief Complaint:   OBESITY Melissa Leach is here to discuss her progress with her obesity treatment plan along with follow-up of her obesity related diagnoses. Melissa Leach is on the Category 2 Plan and states she is following her eating plan approximately 50% of the time. Melissa Leach states she is doing physical therapy and knee exercising, and walking the dog for 20 minutes 7 times per week.  Today's visit was #: 18 Starting weight: 194 lbs Starting date: 04/08/2019 Today's weight: 177 lbs Today's date: 05/29/2020 Total lbs lost to date: 17 Total lbs lost since last in-office visit: 0  Interim History: Melissa Leach's last visit was approximately 3 months ago. She has had her left shoulder replaced again, and she is healing and doing physical therapy. She is not sleeping well due to pain and she notes increased PM cravings for simple carbohydrates.  Subjective:   1. Vitamin D deficiency Melissa Leach is stable on Vit D. She is at high risk of over-replacement. She denies nausea, vomiting, or muscle weakness.  2. Acquired hypothyroidism Melissa Leach had her levothyroxine decreased to 88 mcg due to low TSH before surgery. She had skipped a few doses as well, but is taking regularly now.  3. Other hyperlipidemia Melissa Leach is working on diet and exercise, and she is stable on Lipitor. She is due for labs.  4. Pre-diabetes Melissa Leach is tolerating metformin, and she has struggled to decrease simple carbohydrates especially recently.  5. Other depression with emotional eating  Melissa Leach feels the Topamax isn't helping and she would like to discontinue. She has increased stress and pain, and decreased sleep which all contributes to emotional eating behaviors.  Assessment/Plan:   1. Vitamin D deficiency Low Vitamin D level contributes to fatigue and are associated with obesity, breast, and colon cancer. We will check labs today, and we will refill prescription Vitamin D for 1 month. Melissa Leach will follow-up for routine  testing of Vitamin D, at least 2-3 times per year to avoid over-replacement.  - VITAMIN D 25 Hydroxy (Vit-D Deficiency, Fractures) - Vitamin D, Ergocalciferol, (DRISDOL) 1.25 MG (50000 UNIT) CAPS capsule; TAKE 1 CAPSULE BY MOUTH ONE TIME PER WEEK  Dispense: 4 capsule; Refill: 0  2. Acquired hypothyroidism We will check labs today. Melissa Leach is to follow up with her primary care physician. Orders and follow up as documented in patient record.  - TSH  3. Other hyperlipidemia Cardiovascular risk and specific lipid/LDL goals reviewed. We discussed several lifestyle modifications today. Melissa Leach will continue Lipitor, and will continue to work on diet, exercise and weight loss efforts. We will check labs today. Orders and follow up as documented in patient record.   - Lipid Panel With LDL/HDL Ratio  4. Pre-diabetes Melissa Leach will continue to work on weight loss, exercise, and decreasing simple carbohydrates to help decrease the risk of diabetes. We will check labs today, and we will refill metformin for 1 month.  - Comprehensive metabolic panel - Hemoglobin A1c - Insulin, random - metFORMIN (GLUCOPHAGE) 500 MG tablet; TAKE 1 TABLET BY MOUTH EVERY DAY WITH BREAKFAST  Dispense: 30 tablet; Refill: 0  5. Other depression with emotional eating  Melissa Leach agreed to discontinue Topamax. She was educated on emotional eating behaviors and was instructed to work on activities she enjoys to help increase serotonin and dopamine receptacle. Orders and follow up as documented in patient record.   6. Class 1 obesity with serious comorbidity and body mass index (BMI) of 33.0 to 33.9 in adult, unspecified obesity type Melissa Leach is currently in  the action stage of change. As such, her goal is to continue with weight loss efforts. She has agreed to the Category 2 Plan.   Exercise goals: As is.  Behavioral modification strategies: increasing lean protein intake and emotional eating strategies.  Melissa Leach has agreed to  follow-up with our clinic in 4 weeks. She was informed of the importance of frequent follow-up visits to maximize her success with intensive lifestyle modifications for her multiple health conditions.   Melissa Leach was informed we would discuss her lab results at her next visit unless there is a critical issue that needs to be addressed sooner. Melissa Leach agreed to keep her next visit at the agreed upon time to discuss these results.  Objective:   Blood pressure 92/60, pulse 81, temperature 97.6 F (36.4 C), height 5\' 1"  (1.549 m), weight 177 lb (80.3 kg), last menstrual period 12/27/2002, SpO2 97 %. Body mass index is 33.44 kg/m.  General: Cooperative, alert, well developed, in no acute distress. HEENT: Conjunctivae and lids unremarkable. Cardiovascular: Regular rhythm.  Lungs: Normal work of breathing. Neurologic: No focal deficits.   Lab Results  Component Value Date   CREATININE 0.82 05/29/2020   BUN 20 05/29/2020   NA 142 05/29/2020   K 4.8 05/29/2020   CL 108 (H) 05/29/2020   CO2 21 05/29/2020   Lab Results  Component Value Date   ALT 16 05/29/2020   AST 23 05/29/2020   ALKPHOS 98 05/29/2020   BILITOT 0.3 05/29/2020   Lab Results  Component Value Date   HGBA1C 5.3 05/29/2020   HGBA1C 5.4 03/14/2020   HGBA1C 5.5 11/01/2019   HGBA1C 5.5 07/22/2019   HGBA1C 5.6 04/08/2019   Lab Results  Component Value Date   INSULIN 9.2 05/29/2020   INSULIN 8.3 11/01/2019   INSULIN 9.8 07/22/2019   INSULIN 12.1 04/08/2019   Lab Results  Component Value Date   TSH 0.399 (L) 05/29/2020   Lab Results  Component Value Date   CHOL 140 05/29/2020   HDL 41 05/29/2020   LDLCALC 68 05/29/2020   TRIG 186 (H) 05/29/2020   Lab Results  Component Value Date   WBC 6.2 04/04/2020   HGB 12.9 04/04/2020   HCT 40.9 04/04/2020   MCV 98.8 04/04/2020   PLT 268 04/04/2020   No results found for: IRON, TIBC, FERRITIN  Obesity Behavioral Intervention:   Approximately 15 minutes were spent  on the discussion below.  ASK: We discussed the diagnosis of obesity with Melissa Leach today and Melissa Leach agreed to give Melissa Leach permission to discuss obesity behavioral modification therapy today.  ASSESS: Melissa Leach has the diagnosis of obesity and her BMI today is 33.46. Melissa Leach is in the action stage of change.   ADVISE: Melissa Leach was educated on the multiple health risks of obesity as well as the benefit of weight loss to improve her health. She was advised of the need for long term treatment and the importance of lifestyle modifications to improve her current health and to decrease her risk of future health problems.  AGREE: Multiple dietary modification options and treatment options were discussed and Melissa Leach agreed to follow the recommendations documented in the above note.  ARRANGE: Melissa Leach was educated on the importance of frequent visits to treat obesity as outlined per CMS and USPSTF guidelines and agreed to schedule her next follow up appointment today.  Attestation Statements:   Reviewed by clinician on day of visit: allergies, medications, problem list, medical history, surgical history, family history, social history, and previous encounter notes.  I, Trixie Dredge, am acting as transcriptionist for Dennard Nip, MD.  I have reviewed the above documentation for accuracy and completeness, and I agree with the above. -  Dennard Nip, MD

## 2020-05-31 DIAGNOSIS — Z4789 Encounter for other orthopedic aftercare: Secondary | ICD-10-CM | POA: Diagnosis not present

## 2020-06-01 DIAGNOSIS — M25512 Pain in left shoulder: Secondary | ICD-10-CM | POA: Diagnosis not present

## 2020-06-06 ENCOUNTER — Other Ambulatory Visit (INDEPENDENT_AMBULATORY_CARE_PROVIDER_SITE_OTHER): Payer: Self-pay | Admitting: Family Medicine

## 2020-06-06 DIAGNOSIS — R7303 Prediabetes: Secondary | ICD-10-CM

## 2020-06-26 DIAGNOSIS — E039 Hypothyroidism, unspecified: Secondary | ICD-10-CM | POA: Diagnosis not present

## 2020-06-26 DIAGNOSIS — Z23 Encounter for immunization: Secondary | ICD-10-CM | POA: Diagnosis not present

## 2020-06-26 DIAGNOSIS — R69 Illness, unspecified: Secondary | ICD-10-CM | POA: Diagnosis not present

## 2020-06-28 DIAGNOSIS — Z4789 Encounter for other orthopedic aftercare: Secondary | ICD-10-CM | POA: Diagnosis not present

## 2020-06-29 DIAGNOSIS — M25512 Pain in left shoulder: Secondary | ICD-10-CM | POA: Diagnosis not present

## 2020-07-02 ENCOUNTER — Other Ambulatory Visit (INDEPENDENT_AMBULATORY_CARE_PROVIDER_SITE_OTHER): Payer: Self-pay | Admitting: Family Medicine

## 2020-07-02 DIAGNOSIS — R7303 Prediabetes: Secondary | ICD-10-CM

## 2020-07-03 ENCOUNTER — Ambulatory Visit (INDEPENDENT_AMBULATORY_CARE_PROVIDER_SITE_OTHER): Payer: Medicare HMO | Admitting: Family Medicine

## 2020-07-03 ENCOUNTER — Encounter (INDEPENDENT_AMBULATORY_CARE_PROVIDER_SITE_OTHER): Payer: Self-pay | Admitting: Family Medicine

## 2020-07-03 ENCOUNTER — Other Ambulatory Visit: Payer: Self-pay

## 2020-07-03 VITALS — BP 108/70 | HR 76 | Temp 98.5°F | Ht 61.0 in | Wt 177.0 lb

## 2020-07-03 DIAGNOSIS — F458 Other somatoform disorders: Secondary | ICD-10-CM | POA: Diagnosis not present

## 2020-07-03 DIAGNOSIS — Z6841 Body Mass Index (BMI) 40.0 and over, adult: Secondary | ICD-10-CM

## 2020-07-03 DIAGNOSIS — E559 Vitamin D deficiency, unspecified: Secondary | ICD-10-CM | POA: Diagnosis not present

## 2020-07-03 DIAGNOSIS — R69 Illness, unspecified: Secondary | ICD-10-CM | POA: Diagnosis not present

## 2020-07-03 DIAGNOSIS — R7303 Prediabetes: Secondary | ICD-10-CM

## 2020-07-03 MED ORDER — METFORMIN HCL 500 MG PO TABS: ORAL_TABLET | ORAL | 0 refills | Status: DC

## 2020-07-03 MED ORDER — VITAMIN D (ERGOCALCIFEROL) 1.25 MG (50000 UNIT) PO CAPS: ORAL_CAPSULE | ORAL | 0 refills | Status: DC

## 2020-07-04 DIAGNOSIS — M25512 Pain in left shoulder: Secondary | ICD-10-CM | POA: Diagnosis not present

## 2020-07-06 DIAGNOSIS — M25512 Pain in left shoulder: Secondary | ICD-10-CM | POA: Diagnosis not present

## 2020-07-12 DIAGNOSIS — M25512 Pain in left shoulder: Secondary | ICD-10-CM | POA: Diagnosis not present

## 2020-07-13 ENCOUNTER — Encounter (INDEPENDENT_AMBULATORY_CARE_PROVIDER_SITE_OTHER): Payer: Self-pay | Admitting: Family Medicine

## 2020-07-14 ENCOUNTER — Other Ambulatory Visit (INDEPENDENT_AMBULATORY_CARE_PROVIDER_SITE_OTHER): Payer: Self-pay | Admitting: Family Medicine

## 2020-07-14 DIAGNOSIS — R7303 Prediabetes: Secondary | ICD-10-CM

## 2020-07-14 DIAGNOSIS — M25512 Pain in left shoulder: Secondary | ICD-10-CM | POA: Diagnosis not present

## 2020-07-18 NOTE — Progress Notes (Signed)
Chief Complaint:   OBESITY Melissa Leach is here to discuss her progress with her obesity treatment plan along with follow-up of her obesity related diagnoses. Melissa Leach is on the Category 2 Plan and states she is following her eating plan approximately 50% of the time. Melissa Leach states she is walking the dog for 30 minutes 7 times per week.  Today's visit was #: 19 Starting weight: 216 lbs Starting date: 04/08/2019 Today's weight: 177 lbs Today's date: 07/03/2020 Total lbs lost to date: 39 Total lbs lost since last in-office visit: 0  Interim History: Melissa Leach feels her anxiety had been worsening, and her primary care physician changed her medicines including starting Wellbutrin. This should help decrease her cravings and emotional eating behaviors.  Subjective:   1. Vitamin D deficiency Melissa Leach's Vit D level is at goal. She denies nausea, vomiting, or muscle weakness. I discussed labs with the patient today.  2. Pre-diabetes Melissa Leach's A1c continues to improve with diet and weight loss. She is stable on metformin. I discussed labs with the patient today.  3. Bruxism Melissa Leach notes increased jaw clenching and pain, which she feels is related to anxiety. She wonders if anything can be done for this.  Assessment/Plan:   1. Vitamin D deficiency Low Vitamin D level contributes to fatigue and are associated with obesity, breast, and colon cancer. We will refill prescription Vitamin D for 1 month. Melissa Leach will follow-up for routine testing of Vitamin D, at least 2-3 times per year to avoid over-replacement.  - Vitamin D, Ergocalciferol, (DRISDOL) 1.25 MG (50000 UNIT) CAPS capsule; TAKE 1 CAPSULE BY MOUTH ONE TIME PER WEEK  Dispense: 4 capsule; Refill: 0  2. Pre-diabetes Melissa Leach will continue to work on weight loss, exercise, and decreasing simple carbohydrates to help decrease the risk of diabetes. We will refill metformin for 1 month.  - metFORMIN (GLUCOPHAGE) 500 MG tablet; TAKE 1 TABLET BY  MOUTH EVERY DAY WITH BREAKFAST  Dispense: 30 tablet; Refill: 0  3. Bruxism We will refer Melissa Leach to Dr. Lucia Gaskins for evaluation and possible botox injection if indicated.  - Ambulatory referral to Neurology  4. Obesity with current BMI of 33.6 Melissa Leach is currently in the action stage of change. As such, her goal is to continue with weight loss efforts. She has agreed to the Category 2 Plan.   Exercise goals: As is.  Behavioral modification strategies: emotional eating strategies.  Melissa Leach has agreed to follow-up with our clinic in 4 weeks. She was informed of the importance of frequent follow-up visits to maximize her success with intensive lifestyle modifications for her multiple health conditions.   Objective:   Blood pressure 108/70, pulse 76, temperature 98.5 F (36.9 C), height 5\' 1"  (1.549 m), weight 177 lb (80.3 kg), last menstrual period 12/27/2002, SpO2 98 %. Body mass index is 33.44 kg/m.  General: Cooperative, alert, well developed, in no acute distress. HEENT: Conjunctivae and lids unremarkable. Cardiovascular: Regular rhythm.  Lungs: Normal work of breathing. Neurologic: No focal deficits.   Lab Results  Component Value Date   CREATININE 0.82 05/29/2020   BUN 20 05/29/2020   NA 142 05/29/2020   K 4.8 05/29/2020   CL 108 (H) 05/29/2020   CO2 21 05/29/2020   Lab Results  Component Value Date   ALT 16 05/29/2020   AST 23 05/29/2020   ALKPHOS 98 05/29/2020   BILITOT 0.3 05/29/2020   Lab Results  Component Value Date   HGBA1C 5.3 05/29/2020   HGBA1C 5.4 03/14/2020   HGBA1C  5.5 11/01/2019   HGBA1C 5.5 07/22/2019   HGBA1C 5.6 04/08/2019   Lab Results  Component Value Date   INSULIN 9.2 05/29/2020   INSULIN 8.3 11/01/2019   INSULIN 9.8 07/22/2019   INSULIN 12.1 04/08/2019   Lab Results  Component Value Date   TSH 0.399 (L) 05/29/2020   Lab Results  Component Value Date   CHOL 140 05/29/2020   HDL 41 05/29/2020   LDLCALC 68 05/29/2020   TRIG 186  (H) 05/29/2020   Lab Results  Component Value Date   WBC 6.2 04/04/2020   HGB 12.9 04/04/2020   HCT 40.9 04/04/2020   MCV 98.8 04/04/2020   PLT 268 04/04/2020   No results found for: IRON, TIBC, FERRITIN  Obesity Behavioral Intervention:   Approximately 15 minutes were spent on the discussion below.  ASK: We discussed the diagnosis of obesity with Melissa Leach today and Melissa Leach agreed to give [place] permission to discuss obesity behavioral modification therapy today.  ASSESS: Melissa Leach has the diagnosis of obesity and her BMI today is 33.46. Melissa Leach is in the action stage of change.   ADVISE: Melissa Leach was educated on the multiple health risks of obesity as well as the benefit of weight loss to improve her health. She was advised of the need for long term treatment and the importance of lifestyle modifications to improve her current health and to decrease her risk of future health problems.  AGREE: Multiple dietary modification options and treatment options were discussed and Melissa Leach agreed to follow the recommendations documented in the above note.  ARRANGE: Melissa Leach was educated on the importance of frequent visits to treat obesity as outlined per CMS and USPSTF guidelines and agreed to schedule her next follow up appointment today.  Attestation Statements:   Reviewed by clinician on day of visit: allergies, medications, problem list, medical history, surgical history, family history, social history, and previous encounter notes.   I, Burt Knack, am acting as transcriptionist for Quillian Quince, MD.  I have reviewed the above documentation for accuracy and completeness, and I agree with the above. -  Quillian Quince, MD

## 2020-07-20 DIAGNOSIS — M25512 Pain in left shoulder: Secondary | ICD-10-CM | POA: Diagnosis not present

## 2020-07-25 DIAGNOSIS — M25512 Pain in left shoulder: Secondary | ICD-10-CM | POA: Diagnosis not present

## 2020-07-31 DIAGNOSIS — M25512 Pain in left shoulder: Secondary | ICD-10-CM | POA: Diagnosis not present

## 2020-08-01 ENCOUNTER — Ambulatory Visit: Payer: Medicare HMO | Admitting: Neurology

## 2020-08-01 DIAGNOSIS — E039 Hypothyroidism, unspecified: Secondary | ICD-10-CM | POA: Diagnosis not present

## 2020-08-02 ENCOUNTER — Other Ambulatory Visit: Payer: Self-pay

## 2020-08-02 ENCOUNTER — Ambulatory Visit (INDEPENDENT_AMBULATORY_CARE_PROVIDER_SITE_OTHER): Payer: Medicare HMO | Admitting: Family Medicine

## 2020-08-02 ENCOUNTER — Encounter (INDEPENDENT_AMBULATORY_CARE_PROVIDER_SITE_OTHER): Payer: Self-pay | Admitting: Family Medicine

## 2020-08-02 VITALS — BP 118/72 | HR 79 | Temp 98.0°F | Ht 61.0 in | Wt 179.0 lb

## 2020-08-02 DIAGNOSIS — Z6841 Body Mass Index (BMI) 40.0 and over, adult: Secondary | ICD-10-CM | POA: Diagnosis not present

## 2020-08-02 DIAGNOSIS — R69 Illness, unspecified: Secondary | ICD-10-CM | POA: Diagnosis not present

## 2020-08-02 DIAGNOSIS — R7303 Prediabetes: Secondary | ICD-10-CM | POA: Diagnosis not present

## 2020-08-02 DIAGNOSIS — E559 Vitamin D deficiency, unspecified: Secondary | ICD-10-CM

## 2020-08-02 DIAGNOSIS — F3289 Other specified depressive episodes: Secondary | ICD-10-CM | POA: Diagnosis not present

## 2020-08-02 DIAGNOSIS — E66813 Obesity, class 3: Secondary | ICD-10-CM

## 2020-08-02 MED ORDER — METFORMIN HCL 500 MG PO TABS: ORAL_TABLET | ORAL | 0 refills | Status: DC

## 2020-08-02 MED ORDER — BUPROPION HCL ER (SR) 150 MG PO TB12: 150.0000 mg | ORAL_TABLET | Freq: Every morning | ORAL | 0 refills | Status: DC

## 2020-08-03 DIAGNOSIS — M25312 Other instability, left shoulder: Secondary | ICD-10-CM | POA: Diagnosis not present

## 2020-08-03 DIAGNOSIS — M25512 Pain in left shoulder: Secondary | ICD-10-CM | POA: Diagnosis not present

## 2020-08-03 MED ORDER — VITAMIN D (ERGOCALCIFEROL) 1.25 MG (50000 UNIT) PO CAPS: ORAL_CAPSULE | ORAL | 0 refills | Status: DC

## 2020-08-03 NOTE — Progress Notes (Signed)
Chief Complaint:   OBESITY Melissa Leach is here to discuss her progress with her obesity treatment plan along with follow-up of her obesity related diagnoses. Melissa Leach is on the Category 2 Plan and states she is following her eating plan approximately 50% of the time. Melissa Leach states she is walking for 20 minutes 2 times per week.  Today's visit was #: 20 Starting weight: 216 lbs Starting date: 04/08/2019 Today's weight: 179 lbs Today's date: 08/02/2020 Total lbs lost to date: 37 Total lbs lost since last in-office visit: 0  Interim History: Melissa Leach has been struggling with carbohydrate cravings more this last month. She has multiple stressors which is likely contributing to her increased snacking.  Subjective:   1. Vitamin D deficiency Melissa Leach is stable on Vit D, and she denies nausea or vomiting.  2. Pre-diabetes Melissa Leach is stable on metformin, and she denies nausea, vomiting, or hypoglycemia. She is working on diet, but she has had increased carbohydrate cravings.  3. Other depression with emotional eating Melissa Leach started on Wellbutrin XL approximately 1 month ago. She is still struggling with emotional eating behaviors.   Assessment/Plan:   1. Vitamin D deficiency Low Vitamin D level contributes to fatigue and are associated with obesity, breast, and colon cancer. We will refill prescription Vitamin D 50,000 IU every week #4 for 1 month. Melissa Leach will follow-up for routine testing of Vitamin D, at least 2-3 times per year to avoid over-replacement.  2. Pre-diabetes Melissa Leach will continue to work on weight loss, exercise, and decreasing simple carbohydrates to help decrease the risk of diabetes. We will refill metformin for 1 month.  - metFORMIN (GLUCOPHAGE) 500 MG tablet; TAKE 1 TABLET BY MOUTH EVERY DAY WITH BREAKFAST  Dispense: 30 tablet; Refill: 0  3. Other depression with emotional eating Behavior modification techniques were discussed today to help Melissa Leach deal with her  emotional/non-hunger eating behaviors. Melissa Leach agreed to change Wellbutrin to SR 150 mg q AM with no refills. She was informed that SR version helps both mood and emotional eating behaviors. We will follow closely. Orders and follow up as documented in patient record.   - buPROPion (WELLBUTRIN SR) 150 MG 12 hr tablet; Take 1 tablet (150 mg total) by mouth in the morning.  Dispense: 30 tablet; Refill: 0  4. Obesity with current BMI 33.8 Melissa Leach is currently in the action stage of change. As such, her goal is to continue with weight loss efforts. She has agreed to the Category 2 Plan.   Exercise goals: As is.  Behavioral modification strategies: emotional eating strategies.  Melissa Leach has agreed to follow-up with our clinic in 4 weeks. She was informed of the importance of frequent follow-up visits to maximize her success with intensive lifestyle modifications for her multiple health conditions.   Objective:   Blood pressure 118/72, pulse 79, temperature 98 F (36.7 C), height 5\' 1"  (1.549 m), weight 179 lb (81.2 kg), last menstrual period 12/27/2002, SpO2 98 %. Body mass index is 33.82 kg/m.  General: Cooperative, alert, well developed, in no acute distress. HEENT: Conjunctivae and lids unremarkable. Cardiovascular: Regular rhythm.  Lungs: Normal work of breathing. Neurologic: No focal deficits.   Lab Results  Component Value Date   CREATININE 0.82 05/29/2020   BUN 20 05/29/2020   NA 142 05/29/2020   K 4.8 05/29/2020   CL 108 (H) 05/29/2020   CO2 21 05/29/2020   Lab Results  Component Value Date   ALT 16 05/29/2020   AST 23 05/29/2020   ALKPHOS  98 05/29/2020   BILITOT 0.3 05/29/2020   Lab Results  Component Value Date   HGBA1C 5.3 05/29/2020   HGBA1C 5.4 03/14/2020   HGBA1C 5.5 11/01/2019   HGBA1C 5.5 07/22/2019   HGBA1C 5.6 04/08/2019   Lab Results  Component Value Date   INSULIN 9.2 05/29/2020   INSULIN 8.3 11/01/2019   INSULIN 9.8 07/22/2019   INSULIN 12.1  04/08/2019   Lab Results  Component Value Date   TSH 0.399 (L) 05/29/2020   Lab Results  Component Value Date   CHOL 140 05/29/2020   HDL 41 05/29/2020   LDLCALC 68 05/29/2020   TRIG 186 (H) 05/29/2020   Lab Results  Component Value Date   WBC 6.2 04/04/2020   HGB 12.9 04/04/2020   HCT 40.9 04/04/2020   MCV 98.8 04/04/2020   PLT 268 04/04/2020   No results found for: IRON, TIBC, FERRITIN  Obesity Behavioral Intervention:   Approximately 15 minutes were spent on the discussion below.  ASK: We discussed the diagnosis of obesity with Melissa Leach today and Melissa Leach agreed to give [place] permission to discuss obesity behavioral modification therapy today.  ASSESS: Melissa Leach has the diagnosis of obesity and her BMI today is 33.84. Melissa Leach is in the action stage of change.   ADVISE: Melissa Leach was educated on the multiple health risks of obesity as well as the benefit of weight loss to improve her health. She was advised of the need for long term treatment and the importance of lifestyle modifications to improve her current health and to decrease her risk of future health problems.  AGREE: Multiple dietary modification options and treatment options were discussed and Melissa Leach agreed to follow the recommendations documented in the above note.  ARRANGE: Melissa Leach was educated on the importance of frequent visits to treat obesity as outlined per CMS and USPSTF guidelines and agreed to schedule her next follow up appointment today.  Attestation Statements:   Reviewed by clinician on day of visit: allergies, medications, problem list, medical history, surgical history, family history, social history, and previous encounter notes.   I, Burt Knack, am acting as transcriptionist for Quillian Quince, MD.  I have reviewed the above documentation for accuracy and completeness, and I agree with the above. -  Quillian Quince, MD

## 2020-08-08 DIAGNOSIS — M25512 Pain in left shoulder: Secondary | ICD-10-CM | POA: Diagnosis not present

## 2020-08-10 DIAGNOSIS — M25512 Pain in left shoulder: Secondary | ICD-10-CM | POA: Diagnosis not present

## 2020-08-11 ENCOUNTER — Other Ambulatory Visit: Payer: Self-pay

## 2020-08-11 ENCOUNTER — Telehealth: Payer: Self-pay | Admitting: Neurology

## 2020-08-11 ENCOUNTER — Ambulatory Visit: Payer: Medicare HMO | Admitting: Neurology

## 2020-08-11 ENCOUNTER — Encounter: Payer: Self-pay | Admitting: Neurology

## 2020-08-11 VITALS — BP 97/63 | HR 74 | Ht 61.0 in | Wt 185.0 lb

## 2020-08-11 DIAGNOSIS — R69 Illness, unspecified: Secondary | ICD-10-CM | POA: Diagnosis not present

## 2020-08-11 DIAGNOSIS — F458 Other somatoform disorders: Secondary | ICD-10-CM | POA: Diagnosis not present

## 2020-08-11 DIAGNOSIS — G43709 Chronic migraine without aura, not intractable, without status migrainosus: Secondary | ICD-10-CM

## 2020-08-11 NOTE — Telephone Encounter (Signed)
Botox charge sheet completed. Dx: I68.032. Charge sheet pending MD signature.

## 2020-08-11 NOTE — Progress Notes (Signed)
[facility]    Provider:  Dr Lucia Gaskins Requesting Provider: Quillian Quince, MD Primary Care Provider:  Mila Palmer, MD  CC:  Facial pain  HPI:  Melissa Leach is a 70 y.o. female here as requested by Quillian Quince  for pain in the face. She has a history of migraines. She has migraines 2x a month and they can last days, used to be incapacitating, up to 8 migraine days a month and she wakes up with headaches all the time on the top of the head 15 a month, she doesn't sleep well, on the right side, pulsating/pounding/throbbing, light sensitivity, movement makes it worse, nausea, topamax and imitrex have helped. She also has clenching, much worse through the morning and afternoon, she can't control it, she has popping on the right, she feels she has some mouth movements, happens all day long, unknowingly. Her jaw hurts, she hates it, she has tried a lot of medications, been to the dentist, tried muscle relaxers, OTC analgesics, pain medication, PT?OT, nothing is helping and gives her side effects. No other focal neurologic deficits, associated symptoms, inciting events or modifiable factors.  Reviewed notes, labs and imaging from outside physicians, which showed:  From a thorough review of records, medications tried: tylenol, ASA, flexeril, dexamethasone, benadryl, hydrocodone, ativan, ketorolac, mobic, robaxin, reglan, zofran, amitriptyline, lyrica, phenergan, zoloft, imitrex, topamax, verapamil  Cbc/cmp unremarkable  CT head 2003: TECHNIQUE: (reviewed report) 5 MM AXIAL IMAGES WERE OBTAINED FROM THE SKULL BASE THROUGH THE BRAIN TO THE VERTEX. THERE ARE NO  PRIOR IMAGING STUDIES OF THE BRAIN AVAILABLE FOR COMPARISON.  FINDINGS:  A LARGE SUBCUTANEOUS HEMATOMA IN THE LEFT FRONTAL REGION IS NOTED. THE VENTRICULAR SYSTEM IS  NORMAL IN SIZE AND APPEARANCE FOR AGE. THERE IS NO MASS EFFECT OR MIDLINE SHIFT. THERE IS NO  HEMORRHAGE OR HEMATOMA. NO EXTRA-AXIAL FLUID  COLLECTIONS ARE IDENTIFIED. THERE ARE NO FOCAL BRAIN  PARENCHYMAL ABNORMALITIES.  BONE WINDOW IMAGES DEMONSTRATE NO SKULL FRACTURES. THE VISUALIZED PARANASAL SINUSES AND MASTOID  AIR CELLS APPEAR WELL AERATED.  IMPRESSION  1.  NORMAL INTRACRANIALLY.  2.  LEFT FRONTAL SUBCUTANEOUS HEMATOMA.  Review of Systems: Patient complains of symptoms per HPI as well as the following symptoms:jaw pain . Pertinent negatives and positives per HPI. All others negative.   Social History   Socioeconomic History  . Marital status: Widowed    Spouse name: Not on file  . Number of children: 2  . Years of education: Not on file  . Highest education level: Not on file  Occupational History  . Occupation: Retired    Associate Professor: [employer]  Tobacco Use  . Smoking status: Former Smoker    Packs/day: 0.50    Types: Cigarettes    Quit date: 10/21/1976    Years since quitting: 43.8  . Smokeless tobacco: Never Used  . Tobacco comment: QUIT SMOKING OVER 25 YRS AGO  Vaping Use  . Vaping Use: Never used  Substance and Sexual Activity  . Alcohol use: Yes    Alcohol/week: 2.0 standard drinks    Types: 2 Standard drinks or equivalent per week    Comment: Socially  . Drug use: No  . Sexual activity: Not Currently    Birth control/protection: Post-menopausal  Other Topics Concern  . Not on file  Social History Narrative   Lives alone    Right handed   Caffeine: diet coke, 48 oz/day   Social Determinants of Health   Financial Resource Strain: Not on file  Food Insecurity:  Not on file  Transportation Needs: Not on file  Physical Activity: Not on file  Stress: Not on file  Social Connections: Not on file  Intimate Partner Violence: Not on file    Family History  Problem Relation Age of Onset  . Diabetes Mother   . Hypertension Mother   . Thyroid disease Mother   . Depression Mother   . Anxiety disorder Mother   . Obesity Mother   . Thyroid disease Father   . Heart failure  Father   . Hypertension Father   . Hyperlipidemia Father   . Heart disease Father     Past Medical History:  Diagnosis Date  . Anemia   . Anxiety   . Arthritis    SEVERE PAIN RIGHT HIP  . Blood in urine    "ALWAYS"  --AND STATES ALWAYS TREATED FOR UTI--BUT HAS NEVER HAD UTI.  . Cellulitis   . Chronic back pain   . Complication of anesthesia    "SCRATCHED CORNEA"  WAKING UP FROM  KNEE SURGERY--'82  . Depression    PT'S HUSBAND DIED OF CANCER AT [facility] 2012-03-01  . Gastric ulcer   . Gastritis    HISTORY OF GASTRITIS--OCCAS ABDOMINAL PAIN   . GERD (gastroesophageal reflux disease)    takes Omeprazole daily and states only bc she takes Mobic  . H/O hiatal hernia   . Headache(784.0)    MIGRAINE- INFREQUENT  . History of migraine    takes Topamax daily and Imitrex prn  . History of shingles   . Hyperlipidemia    takes Lipitor daily  . Hypothyroidism 05/13/12  . Insomnia    takes Xanax and Ativan prn  . Joint pain   . Joint swelling   . Lichenoid dermatitis 03/16/2007   perianal biopsy  . Nocturia   . Osteoarthritis   . Recurrent sinus infections   . Stomach ulcer   . Vitamin D deficiency     Patient Active Problem List   Diagnosis Date Noted  . Chronic migraine without aura without status migrainosus, not intractable 08/11/2020  . Bruxism 08/11/2020  . Hypothyroidism 05/08/2020  . Status post reverse total shoulder replacement, left 04/07/2020  . Insulin resistance 10/13/2019  . Hypercholesterolemia 07/26/2019  . Other specified hypothyroidism 07/26/2019  . Vitamin D deficiency 05/11/2019  . Prediabetes 05/11/2019  . Pain of right shoulder joint on movement 05/06/2017  . S/P shoulder replacement, right 01/03/2017  . Lichenoid dermatitis 05/17/2016  . Gallstones 08/11/2012  . Postoperative wound infection 07/21/2012  . Expected blood loss anemia 03/04/2012  . S/P right THA, AA 03/02/2012  . History of revision of total replacement of right hip joint 03/02/2012  .  Obese 12/10/2011    Past Surgical History:  Procedure Laterality Date  . COLONOSCOPY    . epidural injections     x 2 in the past 4weeks  . ESOPHAGOGASTRODUODENOSCOPY    . EYE SURGERY  1994   RA surgery-  . HEMATOMA EVACUATION  12/09/2011   Procedure: EVACUATION HEMATOMA;  Surgeon: [name], MD;  Location: [facility];  Service: Orthopedics;  Laterality: Right;  Evacuation of Hematoma Right Knee, I & D and Closed Manipulation of Right Knee  . IRRIGATION AND DEBRIDEMENT KNEE  12/09/2011   Procedure: IRRIGATION AND DEBRIDEMENT KNEE;  Surgeon: [name], MD;  Location: [facility];  Service: Orthopedics;  Laterality: Right;  . JOINT REPLACEMENT     RIGHT TOTAL KNEE REPLACEMENT AND RT TOTAL KNEE REVISION    AND  LEFT TOTAL KNEE REPLACEMENT  . KNEE CLOSED REDUCTION  12/09/2011   Procedure: CLOSED MANIPULATION KNEE;  Surgeon: [name], MD;  Location: [facility];  Service: Orthopedics;  Laterality: Right;  . LUMBAR SPINE SURGERY  05/15/12   3 disc fusion  . REVERSE SHOULDER ARTHROPLASTY Left 04/07/2020   Procedure: REVERSE SHOULDER ARTHROPLASTY;  Surgeon: Beverely Low, MD;  Location: [facility];  Service: Orthopedics;  Laterality: Left;  . TONSILLECTOMY    . TOTAL HIP ARTHROPLASTY  06/25/2011   Procedure: TOTAL HIP ARTHROPLASTY ANTERIOR APPROACH;  Surgeon: [name], MD;  Location: [facility];  Service: Orthopedics;  Laterality: Left;  . TOTAL HIP ARTHROPLASTY  03/02/2012   Procedure: TOTAL HIP ARTHROPLASTY ANTERIOR APPROACH;  Surgeon: [name], MD;  Location: [facility];  Service: Orthopedics;  Laterality: Right;  . TOTAL KNEE REVISION  10/28/2011   Procedure: TOTAL KNEE REVISION;  Surgeon: [name], MD;  Location: [facility];  Service: Orthopedics;  Laterality: Right;  . TOTAL SHOULDER ARTHROPLASTY Right 01/03/2017   Procedure: RIGHT SHOULDER ANATOMIC SHOULDER REPLACEMENT AND ROTATOR CUFF REPAIR;  Surgeon: Beverely Low, MD;  Location: [facility];  Service: Orthopedics;  Laterality: Right;  . TUBAL  LIGATION    . WOUND EXPLORATION Bilateral 07/06/2012   Procedure: WOUND EXPLORATION lumbar;  Surgeon: Temple Pacini, MD;  Location: [facility];  Service: Neurosurgery;  Laterality: Bilateral;  Incision and Drainage    Current Outpatient Medications  Medication Sig Dispense Refill  . atorvastatin (LIPITOR) 40 MG tablet Take 40 mg by mouth every evening.    Marland Kitchen buPROPion (WELLBUTRIN SR) 150 MG 12 hr tablet Take 1 tablet (150 mg total) by mouth in the morning. 30 tablet 0  . Calcium Carb-Cholecalciferol (CALCIUM 600+D3 PO) Take 1 tablet by mouth daily.    . cetirizine (ZYRTEC) 10 MG tablet Take 10 mg by mouth daily.    . clobetasol ointment (TEMOVATE) 0.05 % Apply 1 application topically 2 (two) times daily. Apply twice daily pea size amount to affected area for 2 weeks (Patient taking differently: Apply 1 application topically 2 (two) times daily as needed (irritation).) 30 g 0  . doxycycline (VIBRAMYCIN) 50 MG capsule Take 50 mg by mouth daily.    . hydrocortisone 2.5 % cream Apply 1 application topically daily as needed (irritation).    Marland Kitchen levothyroxine (SYNTHROID) 75 MCG tablet Take 75 mcg by mouth daily before breakfast.    . meloxicam (MOBIC) 15 MG tablet Take 15 mg by mouth daily.    . metFORMIN (GLUCOPHAGE) 500 MG tablet TAKE 1 TABLET BY MOUTH EVERY DAY WITH BREAKFAST 30 tablet 0  . methocarbamol (ROBAXIN) 500 MG tablet Take 1 tablet (500 mg total) by mouth every 8 (eight) hours as needed. 40 tablet 1  . Multiple Vitamin (MULTIVITAMIN WITH MINERALS) TABS Take 1 tablet by mouth daily.    . Multiple Vitamins-Minerals (PRESERVISION AREDS 2+MULTI VIT PO) Take 1 capsule by mouth 2 (two) times daily.    . pantoprazole (PROTONIX) 40 MG tablet Take 40 mg by mouth daily.    . sertraline (ZOLOFT) 50 MG tablet Take 100 mg by mouth daily.    . SUMAtriptan (IMITREX) 100 MG tablet Take 100 mg by mouth every 2 (two) hours as needed for migraine.    . Vitamin D, Ergocalciferol, (DRISDOL) 1.25 MG (50000 UNIT)  CAPS capsule TAKE 1 CAPSULE BY MOUTH ONE TIME PER WEEK 4 capsule 0   No current facility-administered medications for this visit.    Allergies  as of 08/11/2020 - Review Complete 08/11/2020  Allergen Reaction Noted  . Ceftriaxone Other (See Comments) 09/17/2012  . Sulfa antibiotics Rash 06/13/2011    Vitals: BP 97/63 (BP Location: Right Arm, Patient Position: Sitting) Comment: pt states normal for her  Pulse 74   Ht 5\' 1"  (1.549 m)   Wt 185 lb (83.9 kg)   LMP 12/27/2002   BMI 34.96 kg/m  Last Weight:  Wt Readings from Last 1 Encounters:  08/11/20 185 lb (83.9 kg)   Last Height:   Ht Readings from Last 1 Encounters:  08/11/20 5\' 1"  (1.549 m)     Physical exam: Exam: Gen: NAD, conversant, well nourised, obese, well groomed                     CV: RRR, no MRG. No Carotid Bruits. No peripheral edema, warm, nontender Eyes: Conjunctivae clear without exudates or hemorrhage  Neuro: Detailed Neurologic Exam  Speech:    Speech is normal; fluent and spontaneous with normal comprehension.  Cognition:    The patient is oriented to person, place, and time;     recent and remote memory intact;     language fluent;     normal attention, concentration,     fund of knowledge Cranial Nerves:    The pupils are equal, round, and reactive to light. Pupils too small to visualize fundi. Visual fields are full to finger confrontation. Extraocular movements are intact. Trigeminal sensation is intact and the muscles of mastication are normal. The face is symmetric. The palate elevates in the midline. Hearing intact. Voice is normal. Shoulder shrug is normal. The tongue has normal motion without fasciculations.   Coordination:    Normal finger to nose   Gait:    Not ataxic not parkinsonian  Motor Observation:    No asymmetry, no atrophy, and no involuntary movements noted. Tone:    Normal muscle tone.    Posture:    Posture is normal. normal erect    Strength:    Strength is V/V  in the upper and lower limbs.      Sensation: intact to LT     Reflex Exam:  DTR's:    Deep tendon reflexes in the upper and lower extremities are symmetrical bilaterally.   Toes:    The toes are downgoing bilaterally.   Clonus:    Clonus is absent.    Assessment/Plan:  70 year old with chronic migraines and bruxism. Recommend botox.   she wakes up with headaches all the time on the top of the head 15 a month, she doesn't sleep well, may consider sleep eval in the future (or could be bruxism causing the symptoms)  We will call her to schedule.   No orders of the defined types were placed in this encounter.  No orders of the defined types were placed in this encounter.   Cc: Mila PalmerWolters, Sharon, MD,  Quillian Quincearen Beasley MD  Naomie DeanAntonia Sheretta Grumbine, MD  [facility] [address] [place], [place] [id number]  Phone [phone] Fax [phone]

## 2020-08-11 NOTE — Telephone Encounter (Signed)
Can we set her up with botox for migraines. I am going to squeeze her in over the nexr 2-3 weeks. thanks

## 2020-08-12 ENCOUNTER — Other Ambulatory Visit (INDEPENDENT_AMBULATORY_CARE_PROVIDER_SITE_OTHER): Payer: Self-pay | Admitting: Family Medicine

## 2020-08-12 DIAGNOSIS — R7303 Prediabetes: Secondary | ICD-10-CM

## 2020-08-14 DIAGNOSIS — M25512 Pain in left shoulder: Secondary | ICD-10-CM | POA: Diagnosis not present

## 2020-08-14 NOTE — Telephone Encounter (Signed)
Pt last seen by Dr. Beasley.  

## 2020-08-14 NOTE — Telephone Encounter (Signed)
Received charge sheet for 200 units of Botox for G43.709. Patient has [employer]. I filled out their PA form and faxed with notes.

## 2020-08-18 DIAGNOSIS — M25512 Pain in left shoulder: Secondary | ICD-10-CM | POA: Diagnosis not present

## 2020-08-21 NOTE — Telephone Encounter (Signed)
How about noon on Friday? I can mix my own botox.

## 2020-08-21 NOTE — Telephone Encounter (Signed)
I called [employer] [phone]) and spoke with Leeroy Bock to check status of PA request. Leeroy Bock states PA was approved. PA #[id number] (08/14/20- 08/14/21) for 4 visits.

## 2020-08-22 NOTE — Telephone Encounter (Signed)
Wait for an opening, if anyone sees one please put this patient in there thanks

## 2020-08-22 NOTE — Telephone Encounter (Signed)
I called patient and asked her about this Friday at noon. She states she has plans that she is not able to get out of, but she is available most any other time.

## 2020-08-22 NOTE — Telephone Encounter (Signed)
I'll watch for something to open.

## 2020-08-24 DIAGNOSIS — M25512 Pain in left shoulder: Secondary | ICD-10-CM | POA: Diagnosis not present

## 2020-08-25 NOTE — Telephone Encounter (Signed)
Put her on the afternoon of June 13, see prior message to open that up thanls

## 2020-08-26 ENCOUNTER — Other Ambulatory Visit (INDEPENDENT_AMBULATORY_CARE_PROVIDER_SITE_OTHER): Payer: Self-pay | Admitting: Family Medicine

## 2020-08-26 DIAGNOSIS — F3289 Other specified depressive episodes: Secondary | ICD-10-CM

## 2020-08-29 NOTE — Telephone Encounter (Signed)
Will refill at patient's 08/30/20 visit with [facility]

## 2020-08-29 NOTE — Telephone Encounter (Signed)
I spoke with the patient and got her scheduled for Monday, June 13 at 3:30 PM.

## 2020-08-30 ENCOUNTER — Encounter (INDEPENDENT_AMBULATORY_CARE_PROVIDER_SITE_OTHER): Payer: Self-pay | Admitting: Family Medicine

## 2020-08-30 ENCOUNTER — Other Ambulatory Visit: Payer: Self-pay

## 2020-08-30 ENCOUNTER — Ambulatory Visit (INDEPENDENT_AMBULATORY_CARE_PROVIDER_SITE_OTHER): Payer: Medicare HMO | Admitting: Family Medicine

## 2020-08-30 VITALS — BP 103/70 | HR 77 | Temp 98.0°F | Ht 61.0 in | Wt 177.0 lb

## 2020-08-30 DIAGNOSIS — E8881 Metabolic syndrome: Secondary | ICD-10-CM

## 2020-08-30 DIAGNOSIS — Z6841 Body Mass Index (BMI) 40.0 and over, adult: Secondary | ICD-10-CM | POA: Diagnosis not present

## 2020-08-30 DIAGNOSIS — E559 Vitamin D deficiency, unspecified: Secondary | ICD-10-CM

## 2020-08-30 DIAGNOSIS — R69 Illness, unspecified: Secondary | ICD-10-CM | POA: Diagnosis not present

## 2020-08-30 DIAGNOSIS — E88819 Insulin resistance, unspecified: Secondary | ICD-10-CM

## 2020-08-30 DIAGNOSIS — R7303 Prediabetes: Secondary | ICD-10-CM

## 2020-08-30 DIAGNOSIS — F3289 Other specified depressive episodes: Secondary | ICD-10-CM | POA: Diagnosis not present

## 2020-08-30 DIAGNOSIS — E66813 Obesity, class 3: Secondary | ICD-10-CM

## 2020-08-30 MED ORDER — METFORMIN HCL 500 MG PO TABS: ORAL_TABLET | ORAL | 0 refills | Status: DC

## 2020-08-30 MED ORDER — VITAMIN D (ERGOCALCIFEROL) 1.25 MG (50000 UNIT) PO CAPS: ORAL_CAPSULE | ORAL | 0 refills | Status: DC

## 2020-08-30 MED ORDER — BUPROPION HCL ER (SR) 150 MG PO TB12: 150.0000 mg | ORAL_TABLET | Freq: Every morning | ORAL | 0 refills | Status: DC

## 2020-08-31 ENCOUNTER — Encounter (INDEPENDENT_AMBULATORY_CARE_PROVIDER_SITE_OTHER): Payer: Self-pay | Admitting: Family Medicine

## 2020-08-31 DIAGNOSIS — M25512 Pain in left shoulder: Secondary | ICD-10-CM | POA: Diagnosis not present

## 2020-08-31 NOTE — Progress Notes (Signed)
Chief Complaint:   OBESITY Melissa Leach is here to discuss her progress with her obesity treatment plan along with follow-up of her obesity related diagnoses. Melissa Leach is on the Category 2 Plan and states she is following her eating plan approximately 50% of the time. Melissa Leach states she is walking for 20 minutes 1-2 times per week.  Today's visit was #: 21 Starting weight: 216 lbs Starting date: 04/08/2019 Today's weight: 177 lbs Today's date: 08/30/2020 Total lbs lost to date: 2 lbs Total lbs lost since last in-office visit: 39 lbs  Interim History: Melissa Leach wishes she was taking this as seriously as when she first started.  She feels she knows what to do but has done terribly sticking to it.  She is having a hard time focusing on the plan. She has tracked her protein and can meet her goal (85 grams per day) when she focuses on it.  She is having shoulder surgery on June 24 and going to see her daughter in [place] next week.  Subjective:   1. Insulin resistance Last fasting insulin mildly elevated at 9.2.  Was 12.1 on 04/08/2019.  On metformin.  Lab Results  Component Value Date   INSULIN 9.2 05/29/2020   INSULIN 8.3 11/01/2019   INSULIN 9.8 07/22/2019   INSULIN 12.1 04/08/2019   Lab Results  Component Value Date   HGBA1C 5.3 05/29/2020   2. Vitamin D deficiency Vitamin D at goal (55.3).  On weekly prescription vitamin D.  3. Other depression with emotional eating Mood stable overall.  Notes carb cravings.  Assessment/Plan:   1. Insulin resistance Refill metformin 500 mg daily, as per below.  - Refill metFORMIN (GLUCOPHAGE) 500 MG tablet; TAKE 1 TABLET BY MOUTH EVERY DAY WITH BREAKFAST  Dispense: 30 tablet; Refill: 0  2. Vitamin D deficiency Refill vitamin D 50,000 IU weekly.  - Refill Vitamin D, Ergocalciferol, (DRISDOL) 1.25 MG (50000 UNIT) CAPS capsule; TAKE 1 CAPSULE BY MOUTH ONE TIME PER WEEK  Dispense: 4 capsule; Refill: 0  3. Other depression with emotional  eating Refill bupropion 150 mg daily, as per below.  - Refill buPROPion (WELLBUTRIN SR) 150 MG 12 hr tablet; Take 1 tablet (150 mg total) by mouth in the morning.  Dispense: 30 tablet; Refill: 0  4. Obesity with current BMI 33.46  Melissa Leach is currently in the action stage of change. As such, her goal is to continue with weight loss efforts. She has agreed to keeping a food journal and adhering to recommended goals of 1200-1300 calories and 85 grams of protein.   Exercise goals: As is.  Behavioral modification strategies: decreasing simple carbohydrates, better snacking choices and travel eating strategies.  Melissa Leach has agreed to follow-up with our clinic in 4 weeks.   Objective:   Blood pressure 103/70, pulse 77, temperature 98 F (36.7 C), height 5\' 1"  (1.549 m), weight 177 lb (80.3 kg), last menstrual period 12/27/2002, SpO2 97 %. Body mass index is 33.44 kg/m.  General: Cooperative, alert, well developed, in no acute distress. HEENT: Conjunctivae and lids unremarkable. Cardiovascular: Regular rhythm.  Lungs: Normal work of breathing. Neurologic: No focal deficits.   Lab Results  Component Value Date   CREATININE 0.82 05/29/2020   BUN 20 05/29/2020   NA 142 05/29/2020   K 4.8 05/29/2020   CL 108 (H) 05/29/2020   CO2 21 05/29/2020   Lab Results  Component Value Date   ALT 16 05/29/2020   AST 23 05/29/2020   ALKPHOS 98 05/29/2020  BILITOT 0.3 05/29/2020   Lab Results  Component Value Date   HGBA1C 5.3 05/29/2020   HGBA1C 5.4 03/14/2020   HGBA1C 5.5 11/01/2019   HGBA1C 5.5 07/22/2019   HGBA1C 5.6 04/08/2019   Lab Results  Component Value Date   INSULIN 9.2 05/29/2020   INSULIN 8.3 11/01/2019   INSULIN 9.8 07/22/2019   INSULIN 12.1 04/08/2019   Lab Results  Component Value Date   TSH 0.399 (L) 05/29/2020   Lab Results  Component Value Date   CHOL 140 05/29/2020   HDL 41 05/29/2020   LDLCALC 68 05/29/2020   TRIG 186 (H) 05/29/2020   Lab Results   Component Value Date   WBC 6.2 04/04/2020   HGB 12.9 04/04/2020   HCT 40.9 04/04/2020   MCV 98.8 04/04/2020   PLT 268 04/04/2020   Obesity Behavioral Intervention:   Approximately 15 minutes were spent on the discussion below.  ASK: We discussed the diagnosis of obesity with Melissa Leach today and Melissa Leach agreed to give [place] permission to discuss obesity behavioral modification therapy today.  ASSESS: Melissa Leach has the diagnosis of obesity and her BMI today is 33.6. Melissa Leach is in the action stage of change.   ADVISE: Melissa Leach was educated on the multiple health risks of obesity as well as the benefit of weight loss to improve her health. She was advised of the need for long term treatment and the importance of lifestyle modifications to improve her current health and to decrease her risk of future health problems.  AGREE: Multiple dietary modification options and treatment options were discussed and Melissa Leach agreed to follow the recommendations documented in the above note.  ARRANGE: Melissa Leach was educated on the importance of frequent visits to treat obesity as outlined per CMS and USPSTF guidelines and agreed to schedule her next follow up appointment today.  Attestation Statements:   Reviewed by clinician on day of visit: allergies, medications, problem list, medical history, surgical history, family history, social history, and previous encounter notes.  I, Insurance claims handler, CMA, am acting as Energy manager for [place], FNP.  I have reviewed the above documentation for accuracy and completeness, and I agree with the above. -  Jesse Sans, FNP

## 2020-09-04 ENCOUNTER — Encounter (INDEPENDENT_AMBULATORY_CARE_PROVIDER_SITE_OTHER): Payer: Self-pay | Admitting: Family Medicine

## 2020-09-05 NOTE — H&P (Signed)
Patient's anticipated LOS is less than 2 midnights, meeting these requirements: - Younger than 38 - Lives within 1 hour of care - Has a competent adult at home to recover with post-op recover - NO history of  - Chronic pain requiring opiods  - Diabetes  - Coronary Artery Disease  - Heart failure  - Heart attack  - Stroke  - DVT/VTE  - Cardiac arrhythmia  - Respiratory Failure/COPD  - Renal failure  - Anemia  - Advanced Liver disease       Melissa Leach is an 70 y.o. female.    Chief Complaint: left shoulder pain  HPI: Pt is a 70 y.o. female complaining of left shoulder pain for multiple years with recent subluxation of reverse total shoulder. Pain had continually increased since the beginning. X-rays show previous left reverse total shoulder. Pt has tried various conservative treatments which have failed to alleviate their symptoms.  Various options are discussed with the patient. Risks, benefits and expectations were discussed with the patient. Patient understand the risks, benefits and expectations and wishes to proceed with surgery.   PCP:  Mila Palmer, MD  D/C Plans: Home  PMH: Past Medical History:  Diagnosis Date  . Anemia   . Anxiety   . Arthritis    SEVERE PAIN RIGHT HIP  . Blood in urine    "ALWAYS"  --AND STATES ALWAYS TREATED FOR UTI--BUT HAS NEVER HAD UTI.  . Cellulitis   . Chronic back pain   . Complication of anesthesia    "SCRATCHED CORNEA"  WAKING UP FROM  KNEE SURGERY--'82  . Depression    PT'S HUSBAND DIED OF CANCER AT [facility] February 18, 2012  . Gastric ulcer   . Gastritis    HISTORY OF GASTRITIS--OCCAS ABDOMINAL PAIN   . GERD (gastroesophageal reflux disease)    takes Omeprazole daily and states only bc she takes Mobic  . H/O hiatal hernia   . Headache(784.0)    MIGRAINE- INFREQUENT  . History of migraine    takes Topamax daily and Imitrex prn  . History of shingles   . Hyperlipidemia    takes Lipitor daily  . Hypothyroidism 05/13/12  .  Insomnia    takes Xanax and Ativan prn  . Joint pain   . Joint swelling   . Lichenoid dermatitis 03/16/2007   perianal biopsy  . Nocturia   . Osteoarthritis   . Recurrent sinus infections   . Stomach ulcer   . Vitamin D deficiency     PSH: Past Surgical History:  Procedure Laterality Date  . COLONOSCOPY    . epidural injections     x 2 in the past 4weeks  . ESOPHAGOGASTRODUODENOSCOPY    . EYE SURGERY  1994   RA surgery-  . HEMATOMA EVACUATION  12/09/2011   Procedure: EVACUATION HEMATOMA;  Surgeon: [name], MD;  Location: [facility];  Service: Orthopedics;  Laterality: Right;  Evacuation of Hematoma Right Knee, I & D and Closed Manipulation of Right Knee  . IRRIGATION AND DEBRIDEMENT KNEE  12/09/2011   Procedure: IRRIGATION AND DEBRIDEMENT KNEE;  Surgeon: [name], MD;  Location: [facility];  Service: Orthopedics;  Laterality: Right;  . JOINT REPLACEMENT     RIGHT TOTAL KNEE REPLACEMENT AND RT TOTAL KNEE REVISION    AND  LEFT TOTAL KNEE REPLACEMENT  . KNEE CLOSED REDUCTION  12/09/2011   Procedure: CLOSED MANIPULATION KNEE;  Surgeon: [name], MD;  Location: [facility];  Service: Orthopedics;  Laterality: Right;  .  LUMBAR SPINE SURGERY  05/15/12   3 disc fusion  . REVERSE SHOULDER ARTHROPLASTY Left 04/07/2020   Procedure: REVERSE SHOULDER ARTHROPLASTY;  Surgeon: Beverely Low, MD;  Location: [facility];  Service: Orthopedics;  Laterality: Left;  . TONSILLECTOMY    . TOTAL HIP ARTHROPLASTY  06/25/2011   Procedure: TOTAL HIP ARTHROPLASTY ANTERIOR APPROACH;  Surgeon: [name], MD;  Location: [facility];  Service: Orthopedics;  Laterality: Left;  . TOTAL HIP ARTHROPLASTY  03/02/2012   Procedure: TOTAL HIP ARTHROPLASTY ANTERIOR APPROACH;  Surgeon: [name], MD;  Location: [facility];  Service: Orthopedics;  Laterality: Right;  . TOTAL KNEE REVISION  10/28/2011   Procedure: TOTAL KNEE REVISION;  Surgeon: [name], MD;  Location: [facility];  Service: Orthopedics;  Laterality: Right;  .  TOTAL SHOULDER ARTHROPLASTY Right 01/03/2017   Procedure: RIGHT SHOULDER ANATOMIC SHOULDER REPLACEMENT AND ROTATOR CUFF REPAIR;  Surgeon: Beverely Low, MD;  Location: [facility];  Service: Orthopedics;  Laterality: Right;  . TUBAL LIGATION    . WOUND EXPLORATION Bilateral 07/06/2012   Procedure: WOUND EXPLORATION lumbar;  Surgeon: Temple Pacini, MD;  Location: [facility];  Service: Neurosurgery;  Laterality: Bilateral;  Incision and Drainage    Social History:  reports that she quit smoking about 43 years ago. Her smoking use included cigarettes. She smoked 0.50 packs per day. She has never used smokeless tobacco. She reports current alcohol use of about 2.0 standard drinks of alcohol per week. She reports that she does not use drugs.  Allergies:  Allergies  Allergen Reactions  . Ceftriaxone Other (See Comments)    gallstones  . Sulfa Antibiotics Rash    Medications: No current facility-administered medications for this encounter.   Current Outpatient Medications  Medication Sig Dispense Refill  . atorvastatin (LIPITOR) 40 MG tablet Take 40 mg by mouth every evening.    Marland Kitchen buPROPion (WELLBUTRIN SR) 150 MG 12 hr tablet Take 1 tablet (150 mg total) by mouth in the morning. 30 tablet 0  . Calcium Carb-Cholecalciferol (CALCIUM 600+D3 PO) Take 1 tablet by mouth daily.    . cetirizine (ZYRTEC) 10 MG tablet Take 10 mg by mouth daily.    . clobetasol ointment (TEMOVATE) 0.05 % Apply 1 application topically 2 (two) times daily. Apply twice daily pea size amount to affected area for 2 weeks (Patient taking differently: Apply 1 application topically 2 (two) times daily as needed (irritation).) 30 g 0  . doxycycline (VIBRAMYCIN) 50 MG capsule Take 50 mg by mouth daily.    . hydrocortisone 2.5 % cream Apply 1 application topically daily as needed (irritation).    Marland Kitchen levothyroxine (SYNTHROID) 75 MCG tablet Take 75 mcg by mouth daily before breakfast.    . meloxicam (MOBIC) 15 MG tablet Take 15 mg by mouth  daily.    . metFORMIN (GLUCOPHAGE) 500 MG tablet TAKE 1 TABLET BY MOUTH EVERY DAY WITH BREAKFAST 30 tablet 0  . methocarbamol (ROBAXIN) 500 MG tablet Take 1 tablet (500 mg total) by mouth every 8 (eight) hours as needed. 40 tablet 1  . Multiple Vitamin (MULTIVITAMIN WITH MINERALS) TABS Take 1 tablet by mouth daily.    . Multiple Vitamins-Minerals (PRESERVISION AREDS 2+MULTI VIT PO) Take 1 capsule by mouth 2 (two) times daily.    . pantoprazole (PROTONIX) 40 MG tablet Take 40 mg by mouth daily.    . sertraline (ZOLOFT) 50 MG tablet Take 100 mg by mouth daily.    . SUMAtriptan (IMITREX) 100 MG tablet Take  100 mg by mouth every 2 (two) hours as needed for migraine.    . Vitamin D, Ergocalciferol, (DRISDOL) 1.25 MG (50000 UNIT) CAPS capsule TAKE 1 CAPSULE BY MOUTH ONE TIME PER WEEK 4 capsule 0    No results found for this or any previous visit (from the past 48 hour(s)). No results found.  ROS: Pain with rom of the left upper extremity  Physical Exam: Alert and oriented 70 y.o. female in no acute distress Cranial nerves 2-12 intact Cervical spine: full rom with no tenderness, nv intact distally Chest: active breath sounds bilaterally, no wheeze rhonchi or rales Heart: regular rate and rhythm, no murmur Abd: non tender non distended with active bowel sounds Hip is stable with rom  Left shoulder with previous reverse total shoulder nv intact distally No rashes or edema  Assessment/Plan Assessment: left shoulder reverse with subluxation  Plan:  Patient will undergo a left shoulder poly exchange by Dr. Ranell Patrick at [place] Risks benefits and expectations were discussed with the patient. Patient understand risks, benefits and expectations and wishes to proceed. Preoperative templating of the joint replacement has been completed, documented, and submitted to the Operating Room personnel in order to optimize intra-operative equipment management.   Alphonsa Overall PA-C, MPAS [facility]  is now [employer] [address], [address], [place], [place] [address] Phone: [phone] [email or web address] Facebook  [employer]

## 2020-09-08 ENCOUNTER — Other Ambulatory Visit (INDEPENDENT_AMBULATORY_CARE_PROVIDER_SITE_OTHER): Payer: Self-pay | Admitting: Family Medicine

## 2020-09-08 DIAGNOSIS — E8881 Metabolic syndrome: Secondary | ICD-10-CM

## 2020-09-11 ENCOUNTER — Ambulatory Visit: Payer: Self-pay | Admitting: Neurology

## 2020-09-11 ENCOUNTER — Encounter: Payer: Self-pay | Admitting: Neurology

## 2020-09-11 NOTE — Telephone Encounter (Signed)
Last seen Dawn 

## 2020-09-14 ENCOUNTER — Encounter (HOSPITAL_COMMUNITY): Payer: Self-pay

## 2020-09-20 ENCOUNTER — Ambulatory Visit: Payer: Medicare HMO | Admitting: Neurology

## 2020-09-20 DIAGNOSIS — G43709 Chronic migraine without aura, not intractable, without status migrainosus: Secondary | ICD-10-CM

## 2020-09-20 NOTE — Progress Notes (Signed)
Botox- 200 units x 1 vial Lot: [id number] Expiration: 03/2023 NDC: 0023-3921-02  Bacteriostatic 0.9% Sodium Chloride- 4mL total Lot: [id number] Expiration: 10/20/2020 NDC: 0409-1966-02  Dx: G43.709 B/B  

## 2020-09-20 NOTE — Progress Notes (Signed)
Consent Form Botulism Toxin Injection For Chronic Migraine  First botox. 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 Headaches centered behind eyes, +15 orb oculi bilat and may need to put more into procerus/corrugators based on her feedback next time.   Reviewed orally with patient, additionally signature is on file:  Botulism toxin has been approved by the Federal drug administration for treatment of chronic migraine. Botulism toxin does not cure chronic migraine and it may not be effective in some patients.  The administration of botulism toxin is accomplished by injecting a small amount of toxin into the muscles of the neck and head. Dosage must be titrated for each individual. Any benefits resulting from botulism toxin tend to wear off after 3 months with a repeat injection required if benefit is to be maintained. Injections are usually done every 3-4 months with maximum effect peak achieved by about 2 or 3 weeks. Botulism toxin is expensive and you should be sure of what costs you will incur resulting from the injection.  The side effects of botulism toxin use for chronic migraine may include:   -Transient, and usually mild, facial weakness with facial injections  -Transient, and usually mild, head or neck weakness with head/neck injections  -Reduction or loss of forehead facial animation due to forehead muscle weakness  -Eyelid drooping  -Dry eye  -Pain at the site of injection or bruising at the site of injection  -Double vision  -Potential unknown long term risks  Contraindications: You should not have Botox if you are pregnant, nursing, allergic to albumin, have an infection, skin condition, or muscle weakness at the site of the injection, or have myasthenia gravis, Lambert-Eaton syndrome, or ALS.  It is also possible that as with any injection, there may be an allergic reaction or no effect from the medication. Reduced effectiveness after repeated injections  is sometimes seen and rarely infection at the injection site may occur. All care will be taken to prevent these side effects. If therapy is given over a long time, atrophy and wasting in the muscle injected may occur. Occasionally the patient's become refractory to treatment because they develop antibodies to the toxin. In this event, therapy needs to be modified.  I have read the above information and consent to the administration of botulism toxin.    BOTOX PROCEDURE NOTE FOR MIGRAINE HEADACHE    Contraindications and precautions discussed with patient(above). Aseptic procedure was observed and patient tolerated procedure. Procedure performed by Dr. Artemio Aly  The condition has existed for more than 6 months, and pt does not have a diagnosis of ALS, Myasthenia Gravis or Lambert-Eaton Syndrome.  Risks and benefits of injections discussed and pt agrees to proceed with the procedure.  Written consent obtained  These injections are medically necessary. Pt  receives good benefits from these injections. These injections do not cause sedations or hallucinations which the oral therapies may cause.  Description of procedure:  The patient was placed in a sitting position. The standard protocol was used for Botox as follows, with 5 units of Botox injected at each site:   -Procerus muscle, midline injection  -Corrugator muscle, bilateral injection  -Frontalis muscle, bilateral injection, with 2 sites each side, medial injection was performed in the upper one third of the frontalis muscle, in the region vertical from the medial inferior edge of the superior orbital rim. The lateral injection was again in the upper one third of the forehead vertically above the lateral limbus  of the cornea, 1.5 cm lateral to the medial injection site.  -Temporalis muscle injection, 4 sites, bilaterally. The first injection was 3 cm above the tragus of the ear, second injection site was 1.5 cm to 3 cm up from the  first injection site in line with the tragus of the ear. The third injection site was 1.5-3 cm forward between the first 2 injection sites. The fourth injection site was 1.5 cm posterior to the second injection site.   -Occipitalis muscle injection, 3 sites, bilaterally. The first injection was done one half way between the occipital protuberance and the tip of the mastoid process behind the ear. The second injection site was done lateral and superior to the first, 1 fingerbreadth from the first injection. The third injection site was 1 fingerbreadth superiorly and medially from the first injection site.  -Cervical paraspinal muscle injection, 2 sites, bilateral knee first injection site was 1 cm from the midline of the cervical spine, 3 cm inferior to the lower border of the occipital protuberance. The second injection site was 1.5 cm superiorly and laterally to the first injection site.  -Trapezius muscle injection was performed at 3 sites, bilaterally. The first injection site was in the upper trapezius muscle halfway between the inflection point of the neck, and the acromion. The second injection site was one half way between the acromion and the first injection site. The third injection was done between the first injection site and the inflection point of the neck.   Will return for repeat injection in 3 months.   200 units of Botox was used, any Botox not injected was wasted. The patient tolerated the procedure well, there were no complications of the above procedure.

## 2020-09-22 ENCOUNTER — Ambulatory Visit: Admit: 2020-09-22 | Payer: Medicare HMO | Admitting: Orthopedic Surgery

## 2020-09-22 SURGERY — HEMIARTHROPLASTY, SHOULDER
Anesthesia: General | Site: Shoulder | Laterality: Left

## 2020-09-26 ENCOUNTER — Encounter (INDEPENDENT_AMBULATORY_CARE_PROVIDER_SITE_OTHER): Payer: Self-pay

## 2020-09-26 ENCOUNTER — Other Ambulatory Visit (INDEPENDENT_AMBULATORY_CARE_PROVIDER_SITE_OTHER): Payer: Self-pay | Admitting: Family Medicine

## 2020-09-26 DIAGNOSIS — F3289 Other specified depressive episodes: Secondary | ICD-10-CM

## 2020-09-27 ENCOUNTER — Encounter (INDEPENDENT_AMBULATORY_CARE_PROVIDER_SITE_OTHER): Payer: Self-pay | Admitting: Family Medicine

## 2020-09-27 MED ORDER — BUPROPION HCL ER (SR) 150 MG PO TB12: 150.0000 mg | ORAL_TABLET | Freq: Every morning | ORAL | 0 refills | Status: DC

## 2020-09-27 NOTE — Telephone Encounter (Signed)
Please advise 

## 2020-09-27 NOTE — Telephone Encounter (Signed)
Pt needs a refill of Bupropion. Last OV and med refill 08/30/20. Next appointment 10/11/20

## 2020-10-04 ENCOUNTER — Ambulatory Visit: Payer: Medicare HMO | Admitting: Neurology

## 2020-10-11 ENCOUNTER — Ambulatory Visit (INDEPENDENT_AMBULATORY_CARE_PROVIDER_SITE_OTHER): Payer: Medicare HMO | Admitting: Family Medicine

## 2020-10-11 ENCOUNTER — Encounter (INDEPENDENT_AMBULATORY_CARE_PROVIDER_SITE_OTHER): Payer: Self-pay | Admitting: Family Medicine

## 2020-10-11 ENCOUNTER — Other Ambulatory Visit: Payer: Self-pay

## 2020-10-11 VITALS — BP 116/78 | HR 82 | Temp 98.9°F | Ht 61.0 in | Wt 179.0 lb

## 2020-10-11 DIAGNOSIS — F3289 Other specified depressive episodes: Secondary | ICD-10-CM | POA: Diagnosis not present

## 2020-10-11 DIAGNOSIS — R7303 Prediabetes: Secondary | ICD-10-CM

## 2020-10-11 DIAGNOSIS — E8881 Metabolic syndrome: Secondary | ICD-10-CM | POA: Diagnosis not present

## 2020-10-11 DIAGNOSIS — Z6841 Body Mass Index (BMI) 40.0 and over, adult: Secondary | ICD-10-CM | POA: Diagnosis not present

## 2020-10-11 DIAGNOSIS — E559 Vitamin D deficiency, unspecified: Secondary | ICD-10-CM

## 2020-10-11 DIAGNOSIS — R69 Illness, unspecified: Secondary | ICD-10-CM | POA: Diagnosis not present

## 2020-10-11 MED ORDER — BUPROPION HCL ER (SR) 150 MG PO TB12: 150.0000 mg | ORAL_TABLET | Freq: Every morning | ORAL | 0 refills | Status: DC

## 2020-10-11 MED ORDER — VITAMIN D (ERGOCALCIFEROL) 1.25 MG (50000 UNIT) PO CAPS: ORAL_CAPSULE | ORAL | 0 refills | Status: DC

## 2020-10-11 MED ORDER — METFORMIN HCL 500 MG PO TABS: ORAL_TABLET | ORAL | 0 refills | Status: DC

## 2020-10-12 ENCOUNTER — Ambulatory Visit: Payer: Medicare HMO | Admitting: Neurology

## 2020-10-12 DIAGNOSIS — G244 Idiopathic orofacial dystonia: Secondary | ICD-10-CM

## 2020-10-12 NOTE — Progress Notes (Signed)
Botox- 100 units x 1 vials  Sample Lot: [id number] Expiration: 07/2022 NDC: 2549-8264-15  Bacteriostatic 0.9% Sodium Chloride- 47mL total Lot: [id number] Expiration: 05/2021 NDC: 6808-8110-31  Dx: R94.585. Sample

## 2020-10-12 NOTE — Progress Notes (Signed)
Patient here at no charge, will inject more botox into masseters +25 each

## 2020-10-16 ENCOUNTER — Telehealth: Payer: Self-pay | Admitting: Neurology

## 2020-10-16 NOTE — Telephone Encounter (Signed)
-----   Message from Anson Fret, MD sent at 10/12/2020  1:12 PM EDT ----- Regarding: orofacial dystonia I would like to treat patient for orofacial dystonia as well as migraine. Can we request an additional 100units for her dystonia (in addition to the 200 units for her migraine botox)

## 2020-10-18 NOTE — Progress Notes (Signed)
Chief Complaint:   OBESITY Melissa Leach is here to discuss her progress with her obesity treatment plan along with follow-up of her obesity related diagnoses. Melissa Leach is on keeping a food journal and adhering to recommended goals of 1200-1300 calories and 85 grams of protein daily and states she is following her eating plan approximately 40% of the time. Melissa Leach states she is walking the dog for 40 minutes 7 times per week.  Today's visit was #: 22 Starting weight: 216 lbs Starting date: 04/08/2019 Today's weight: 179 lbs Today's date: 10/11/2020 Total lbs lost to date: 37 Total lbs lost since last in-office visit: 0  Interim History: Melissa Leach has been struggling to stay on track with journaling and her weight is creeping up. Her father has passed and she is trying to clean out his home. Her stress is naturally elevated and she hasn't been able to concentrate on weight loss. She is trying to get back on track.  Subjective:   1. Vitamin D deficiency Melissa Leach is stable on Vit D, and she requests a refill today.  2. Insulin resistance Melissa Leach is working on diet but she has struggled more recently. She is tolerating metformin well.  3. Other depression with emotional eating Melissa Leach has had increased stress, increased temptations, and increased emotional eating behaviors. She is trying to make smarter choices and she is working to get back on track.  Assessment/Plan:   1. Vitamin D deficiency Low Vitamin D level contributes to fatigue and are associated with obesity, breast, and colon cancer. We will refill prescription Vitamin D for 1 month. Melissa Leach will follow-up for routine testing of Vitamin D, at least 2-3 times per year to avoid over-replacement.   - Vitamin D, Ergocalciferol, (DRISDOL) 1.25 MG (50000 UNIT) CAPS capsule; TAKE 1 CAPSULE BY MOUTH ONE TIME PER WEEK  Dispense: 4 capsule; Refill: 0  2. Insulin resistance Melissa Leach will continue to work on weight loss, exercise, and  decreasing simple carbohydrates to help decrease the risk of diabetes. We will refill metformin for 1 month. Melissa Leach agreed to follow-up with [place] as directed to closely monitor her progress.  - metFORMIN (GLUCOPHAGE) 500 MG tablet; TAKE 1 TABLET BY MOUTH EVERY DAY WITH BREAKFAST  Dispense: 30 tablet; Refill: 0  3. Other depression with emotional eating Behavior modification techniques were discussed today to help Melissa Leach deal with her emotional/non-hunger eating behaviors. We will refill Wellbutrin SR for 1 month. Orders and follow up as documented in patient record.   - buPROPion (WELLBUTRIN SR) 150 MG 12 hr tablet; Take 1 tablet (150 mg total) by mouth in the morning.  Dispense: 30 tablet; Refill: 0  4. Obesity with current BMI 34.0 Melissa Leach is currently in the action stage of change. As such, her goal is to continue with weight loss efforts. She has agreed to change to the Category 2 Plan.   Exercise goals: As is.  Behavioral modification strategies: increasing lean protein intake.  Melissa Leach has agreed to follow-up with our clinic in 4 weeks. She was informed of the importance of frequent follow-up visits to maximize her success with intensive lifestyle modifications for her multiple health conditions.   Objective:   Blood pressure 116/78, pulse 82, temperature 98.9 F (37.2 C), height 5\' 1"  (1.549 m), weight 179 lb (81.2 kg), last menstrual period 12/27/2002, SpO2 97 %. Body mass index is 33.82 kg/m.  General: Cooperative, alert, well developed, in no acute distress. HEENT: Conjunctivae and lids unremarkable. Cardiovascular: Regular rhythm.  Lungs: Normal work of  breathing. Neurologic: No focal deficits.   Lab Results  Component Value Date   CREATININE 0.82 05/29/2020   BUN 20 05/29/2020   NA 142 05/29/2020   K 4.8 05/29/2020   CL 108 (H) 05/29/2020   CO2 21 05/29/2020   Lab Results  Component Value Date   ALT 16 05/29/2020   AST 23 05/29/2020   ALKPHOS 98 05/29/2020    BILITOT 0.3 05/29/2020   Lab Results  Component Value Date   HGBA1C 5.3 05/29/2020   HGBA1C 5.4 03/14/2020   HGBA1C 5.5 11/01/2019   HGBA1C 5.5 07/22/2019   HGBA1C 5.6 04/08/2019   Lab Results  Component Value Date   INSULIN 9.2 05/29/2020   INSULIN 8.3 11/01/2019   INSULIN 9.8 07/22/2019   INSULIN 12.1 04/08/2019   Lab Results  Component Value Date   TSH 0.399 (L) 05/29/2020   Lab Results  Component Value Date   CHOL 140 05/29/2020   HDL 41 05/29/2020   LDLCALC 68 05/29/2020   TRIG 186 (H) 05/29/2020   Lab Results  Component Value Date   VD25OH 55.3 05/29/2020   VD25OH 60.7 11/01/2019   VD25OH 80.9 07/22/2019   Lab Results  Component Value Date   WBC 6.2 04/04/2020   HGB 12.9 04/04/2020   HCT 40.9 04/04/2020   MCV 98.8 04/04/2020   PLT 268 04/04/2020   No results found for: IRON, TIBC, FERRITIN  Obesity Behavioral Intervention:   Approximately 15 minutes were spent on the discussion below.  ASK: We discussed the diagnosis of obesity with Melissa Leach today and Melissa Leach agreed to give [place] permission to discuss obesity behavioral modification therapy today.  ASSESS: Melissa Leach has the diagnosis of obesity and her BMI today is 33.84. Melissa Leach is in the action stage of change.   ADVISE: Melissa Leach was educated on the multiple health risks of obesity as well as the benefit of weight loss to improve her health. She was advised of the need for long term treatment and the importance of lifestyle modifications to improve her current health and to decrease her risk of future health problems.  AGREE: Multiple dietary modification options and treatment options were discussed and Melissa Leach agreed to follow the recommendations documented in the above note.  ARRANGE: Melissa Leach was educated on the importance of frequent visits to treat obesity as outlined per CMS and USPSTF guidelines and agreed to schedule her next follow up appointment today.  Attestation Statements:   Reviewed by  clinician on day of visit: allergies, medications, problem list, medical history, surgical history, family history, social history, and previous encounter notes.   I, Burt Knack, am acting as transcriptionist for Quillian Quince, MD.  I have reviewed the above documentation for accuracy and completeness, and I agree with the above. -  Quillian Quince, MD

## 2020-10-23 DIAGNOSIS — L718 Other rosacea: Secondary | ICD-10-CM | POA: Diagnosis not present

## 2020-10-23 DIAGNOSIS — L738 Other specified follicular disorders: Secondary | ICD-10-CM | POA: Diagnosis not present

## 2020-10-23 DIAGNOSIS — L570 Actinic keratosis: Secondary | ICD-10-CM | POA: Diagnosis not present

## 2020-10-23 DIAGNOSIS — L711 Rhinophyma: Secondary | ICD-10-CM | POA: Diagnosis not present

## 2020-11-03 ENCOUNTER — Other Ambulatory Visit (INDEPENDENT_AMBULATORY_CARE_PROVIDER_SITE_OTHER): Payer: Self-pay | Admitting: Family Medicine

## 2020-11-03 DIAGNOSIS — E8881 Metabolic syndrome: Secondary | ICD-10-CM

## 2020-11-07 ENCOUNTER — Other Ambulatory Visit (INDEPENDENT_AMBULATORY_CARE_PROVIDER_SITE_OTHER): Payer: Self-pay | Admitting: Emergency Medicine

## 2020-11-07 ENCOUNTER — Encounter (INDEPENDENT_AMBULATORY_CARE_PROVIDER_SITE_OTHER): Payer: Self-pay | Admitting: Emergency Medicine

## 2020-11-07 ENCOUNTER — Other Ambulatory Visit (INDEPENDENT_AMBULATORY_CARE_PROVIDER_SITE_OTHER): Payer: Self-pay | Admitting: Adult Health

## 2020-11-07 ENCOUNTER — Other Ambulatory Visit (INDEPENDENT_AMBULATORY_CARE_PROVIDER_SITE_OTHER): Payer: Self-pay | Admitting: Family Medicine

## 2020-11-07 ENCOUNTER — Encounter (INDEPENDENT_AMBULATORY_CARE_PROVIDER_SITE_OTHER): Payer: Self-pay | Admitting: Family Medicine

## 2020-11-07 DIAGNOSIS — E559 Vitamin D deficiency, unspecified: Secondary | ICD-10-CM

## 2020-11-07 DIAGNOSIS — E8881 Metabolic syndrome: Secondary | ICD-10-CM

## 2020-11-07 MED ORDER — METFORMIN HCL 500 MG PO TABS: ORAL_TABLET | ORAL | 0 refills | Status: DC

## 2020-11-07 NOTE — Telephone Encounter (Signed)
Mychart msg has been sent.

## 2020-11-14 ENCOUNTER — Other Ambulatory Visit (INDEPENDENT_AMBULATORY_CARE_PROVIDER_SITE_OTHER): Payer: Self-pay | Admitting: Family Medicine

## 2020-11-14 DIAGNOSIS — F3289 Other specified depressive episodes: Secondary | ICD-10-CM

## 2020-11-14 NOTE — Telephone Encounter (Signed)
Pt last seen by Dr. Beasley.  

## 2020-11-16 ENCOUNTER — Encounter (INDEPENDENT_AMBULATORY_CARE_PROVIDER_SITE_OTHER): Payer: Self-pay | Admitting: Family Medicine

## 2020-11-16 ENCOUNTER — Ambulatory Visit (INDEPENDENT_AMBULATORY_CARE_PROVIDER_SITE_OTHER): Payer: Medicare HMO | Admitting: Family Medicine

## 2020-11-16 ENCOUNTER — Other Ambulatory Visit: Payer: Self-pay

## 2020-11-16 VITALS — BP 108/61 | HR 71 | Temp 97.6°F | Ht 61.0 in | Wt 181.0 lb

## 2020-11-16 DIAGNOSIS — R7303 Prediabetes: Secondary | ICD-10-CM | POA: Diagnosis not present

## 2020-11-16 DIAGNOSIS — Z6841 Body Mass Index (BMI) 40.0 and over, adult: Secondary | ICD-10-CM | POA: Diagnosis not present

## 2020-11-16 DIAGNOSIS — F3289 Other specified depressive episodes: Secondary | ICD-10-CM | POA: Diagnosis not present

## 2020-11-16 DIAGNOSIS — R69 Illness, unspecified: Secondary | ICD-10-CM | POA: Diagnosis not present

## 2020-11-16 DIAGNOSIS — E559 Vitamin D deficiency, unspecified: Secondary | ICD-10-CM | POA: Diagnosis not present

## 2020-11-16 MED ORDER — VITAMIN D (ERGOCALCIFEROL) 1.25 MG (50000 UNIT) PO CAPS: ORAL_CAPSULE | ORAL | 0 refills | Status: DC

## 2020-11-16 MED ORDER — BUPROPION HCL ER (SR) 150 MG PO TB12: 150.0000 mg | ORAL_TABLET | Freq: Every morning | ORAL | 0 refills | Status: DC

## 2020-11-16 NOTE — Telephone Encounter (Signed)
Patient's next appointment is 9/27. I filled out [employer] PA form to request 200 units of Botox to treat patient's migraines (G43.709) and 100 units to treat patient's orofacial dystonia (G24.4). CPT codes 02233, [phone]. Will fax to plan with notes.

## 2020-11-20 NOTE — Progress Notes (Signed)
Chief Complaint:   OBESITY Maurie is here to discuss her progress with her obesity treatment plan along with follow-up of her obesity related diagnoses. Kathleene is on the Category 2 Plan and states she is following her eating plan approximately (unknown)% of the time. Myeisha states she has been increasing her activities, and she is walking the dog 6 times per week.  Today's visit was #: 23 Starting weight: 216 lbs Starting date: 04/08/2019 Today's weight: 181 lbs Today's date: 11/16/2020 Total lbs lost to date: 35 Total lbs lost since last in-office visit: 0  Interim History: Kursten has been busy  cleaning out her father's home of 68 years, and she hasn't been able to follow her plan as well. She has been very active however. She feels she will be able to get back on track now.  Subjective:   1. Pre-diabetes Anitha is working on diet and weight loss. Her last A1c was within normal limits.  2. Vitamin D deficiency Geneieve is stable on Vit D, and she denies nausea, vomiting, or muscle weakness.  3. Other depression with emotional eating Doristine's mood is stable, and she is working on decreasing emotional eating behaviors. Her blood pressure is not elevated.  Assessment/Plan:   1. Pre-diabetes Pleasant will continue to work on diet, exercise, and decreasing simple carbohydrates to help decrease the risk of diabetes.   2. Vitamin D deficiency Low Vitamin D level contributes to fatigue and are associated with obesity, breast, and colon cancer. We will refill prescription Vitamin D for 1 month. Karisa will follow-up for routine testing of Vitamin D, at least 2-3 times per year to avoid over-replacement.  - Vitamin D, Ergocalciferol, (DRISDOL) 1.25 MG (50000 UNIT) CAPS capsule; TAKE 1 CAPSULE BY MOUTH ONE TIME PER WEEK  Dispense: 4 capsule; Refill: 0  3. Other depression with emotional eating Behavior modification techniques were discussed today to help Dniya deal with her  emotional/non-hunger eating behaviors. We will refill Wellbutrin SR for 1 month. Orders and follow up as documented in patient record.   - buPROPion (WELLBUTRIN SR) 150 MG 12 hr tablet; Take 1 tablet (150 mg total) by mouth in the morning.  Dispense: 30 tablet; Refill: 0  4. Obesity with current BMI 34.4 Samra is currently in the action stage of change. As such, her goal is to continue with weight loss efforts. She has agreed to the Category 2 Plan.   Exercise goals: All adults should avoid inactivity. Some physical activity is better than none, and adults who participate in any amount of physical activity gain some health benefits.  Behavioral modification strategies: meal planning and cooking strategies.  Dani has agreed to follow-up with our clinic in 4 weeks. She was informed of the importance of frequent follow-up visits to maximize her success with intensive lifestyle modifications for her multiple health conditions.   Objective:   Blood pressure 108/61, pulse 71, temperature 97.6 F (36.4 C), height 5\' 1"  (1.549 m), weight 181 lb (82.1 kg), last menstrual period 12/27/2002, SpO2 98 %. Body mass index is 34.2 kg/m.  General: Cooperative, alert, well developed, in no acute distress. HEENT: Conjunctivae and lids unremarkable. Cardiovascular: Regular rhythm.  Lungs: Normal work of breathing. Neurologic: No focal deficits.   Lab Results  Component Value Date   CREATININE 0.82 05/29/2020   BUN 20 05/29/2020   NA 142 05/29/2020   K 4.8 05/29/2020   CL 108 (H) 05/29/2020   CO2 21 05/29/2020   Lab Results  Component  Value Date   ALT 16 05/29/2020   AST 23 05/29/2020   ALKPHOS 98 05/29/2020   BILITOT 0.3 05/29/2020   Lab Results  Component Value Date   HGBA1C 5.3 05/29/2020   HGBA1C 5.4 03/14/2020   HGBA1C 5.5 11/01/2019   HGBA1C 5.5 07/22/2019   HGBA1C 5.6 04/08/2019   Lab Results  Component Value Date   INSULIN 9.2 05/29/2020   INSULIN 8.3 11/01/2019    INSULIN 9.8 07/22/2019   INSULIN 12.1 04/08/2019   Lab Results  Component Value Date   TSH 0.399 (L) 05/29/2020   Lab Results  Component Value Date   CHOL 140 05/29/2020   HDL 41 05/29/2020   LDLCALC 68 05/29/2020   TRIG 186 (H) 05/29/2020   Lab Results  Component Value Date   VD25OH 55.3 05/29/2020   VD25OH 60.7 11/01/2019   VD25OH 80.9 07/22/2019   Lab Results  Component Value Date   WBC 6.2 04/04/2020   HGB 12.9 04/04/2020   HCT 40.9 04/04/2020   MCV 98.8 04/04/2020   PLT 268 04/04/2020   No results found for: IRON, TIBC, FERRITIN  Obesity Behavioral Intervention:   Approximately 15 minutes were spent on the discussion below.  ASK: We discussed the diagnosis of obesity with Lanise today and Terilyn agreed to give [place] permission to discuss obesity behavioral modification therapy today.  ASSESS: Leshae has the diagnosis of obesity and her BMI today is 34.4. Cortina is in the action stage of change.   ADVISE: Britaney was educated on the multiple health risks of obesity as well as the benefit of weight loss to improve her health. She was advised of the need for long term treatment and the importance of lifestyle modifications to improve her current health and to decrease her risk of future health problems.  AGREE: Multiple dietary modification options and treatment options were discussed and [place] agreed to follow the recommendations documented in the above note.  ARRANGE: Lurena was educated on the importance of frequent visits to treat obesity as outlined per CMS and USPSTF guidelines and agreed to schedule her next follow up appointment today.  Attestation Statements:   Reviewed by clinician on day of visit: allergies, medications, problem list, medical history, surgical history, family history, social history, and previous encounter notes.   I, Burt Knack, am acting as transcriptionist for Quillian Quince, MD.  I have reviewed the above documentation  for accuracy and completeness, and I agree with the above. -  Quillian Quince, MD

## 2020-11-20 NOTE — Telephone Encounter (Signed)
Received approval. PA #[id number] (11/20/20- 11/20/21).

## 2020-11-30 ENCOUNTER — Other Ambulatory Visit (INDEPENDENT_AMBULATORY_CARE_PROVIDER_SITE_OTHER): Payer: Self-pay | Admitting: Adult Health

## 2020-11-30 DIAGNOSIS — E8881 Metabolic syndrome: Secondary | ICD-10-CM

## 2020-11-30 NOTE — Telephone Encounter (Signed)
LAST APPOINTMENT DATE: 11/16/20 NEXT APPOINTMENT DATE: 12/14/20   [id number] - [place], [place] - [address] AT [address] OF [facility] [address]  [place] [address] Phone: [phone] Fax: [phone]  Patient is requesting a refill of the following medications: Requested Prescriptions   Pending Prescriptions Disp Refills   metFORMIN (GLUCOPHAGE) 500 MG tablet [Pharmacy Med Name: METFORMIN HCL 500 MG TABLET] 30 tablet 0    Sig: TAKE 1 TABLET BY MOUTH EVERY DAY WITH BREAKFAST    Date last filled: 11/07/20 Previously prescribed by [facility]  Lab Results  Component Value Date   HGBA1C 5.3 05/29/2020   HGBA1C 5.4 03/14/2020   HGBA1C 5.5 11/01/2019   Lab Results  Component Value Date   LDLCALC 68 05/29/2020   CREATININE 0.82 05/29/2020   Lab Results  Component Value Date   VD25OH 55.3 05/29/2020   VD25OH 60.7 11/01/2019   VD25OH 80.9 07/22/2019    BP Readings from Last 3 Encounters:  11/16/20 108/61  10/11/20 116/78  08/30/20 103/70

## 2020-11-30 NOTE — Telephone Encounter (Signed)
Dr.Beasley saw pt last.

## 2020-12-08 ENCOUNTER — Other Ambulatory Visit (INDEPENDENT_AMBULATORY_CARE_PROVIDER_SITE_OTHER): Payer: Self-pay | Admitting: Family Medicine

## 2020-12-08 DIAGNOSIS — E8881 Metabolic syndrome: Secondary | ICD-10-CM

## 2020-12-14 ENCOUNTER — Ambulatory Visit (INDEPENDENT_AMBULATORY_CARE_PROVIDER_SITE_OTHER): Payer: Medicare HMO | Admitting: Family Medicine

## 2020-12-14 ENCOUNTER — Encounter (INDEPENDENT_AMBULATORY_CARE_PROVIDER_SITE_OTHER): Payer: Self-pay | Admitting: Family Medicine

## 2020-12-14 ENCOUNTER — Other Ambulatory Visit: Payer: Self-pay

## 2020-12-14 VITALS — BP 100/69 | HR 82 | Temp 97.9°F | Ht 61.0 in | Wt 180.0 lb

## 2020-12-14 DIAGNOSIS — E559 Vitamin D deficiency, unspecified: Secondary | ICD-10-CM | POA: Diagnosis not present

## 2020-12-14 DIAGNOSIS — Z6841 Body Mass Index (BMI) 40.0 and over, adult: Secondary | ICD-10-CM | POA: Diagnosis not present

## 2020-12-14 DIAGNOSIS — E8881 Metabolic syndrome: Secondary | ICD-10-CM | POA: Diagnosis not present

## 2020-12-14 DIAGNOSIS — F3289 Other specified depressive episodes: Secondary | ICD-10-CM | POA: Diagnosis not present

## 2020-12-14 DIAGNOSIS — R69 Illness, unspecified: Secondary | ICD-10-CM | POA: Diagnosis not present

## 2020-12-14 MED ORDER — VITAMIN D (ERGOCALCIFEROL) 1.25 MG (50000 UNIT) PO CAPS
ORAL_CAPSULE | ORAL | 0 refills | Status: DC
Start: 2020-12-14 — End: 2021-01-11

## 2020-12-14 MED ORDER — METFORMIN HCL 500 MG PO TABS: ORAL_TABLET | ORAL | 0 refills | Status: DC

## 2020-12-14 MED ORDER — BUPROPION HCL ER (SR) 200 MG PO TB12: 200.0000 mg | ORAL_TABLET | Freq: Every day | ORAL | 0 refills | Status: DC

## 2020-12-14 NOTE — Progress Notes (Signed)
Chief Complaint:   OBESITY Melissa Leach is here to discuss her progress with her obesity treatment plan along with follow-up of her obesity related diagnoses. Melissa Leach is on the Category 2 Plan and states she is following her eating plan approximately 50% of the time. Melissa Leach states she is walking for 45 minutes 7 times per week.  Today's visit was #: 24 Starting weight: 216 lbs Starting date: 04/08/2019 Today's weight: 180 lbs Today's date: 12/14/2020 Total lbs lost to date: 36 Total lbs lost since last in-office visit: 1  Interim History: Melissa Leach continues to do well with weight loss. She is being mindful of her choices and she has started to increase her exercise now that the weather is nicer.  Subjective:   1. Vitamin D deficiency Melissa Leach is stable on Vit D, and she denies nausea, vomiting, or muscle weakness.  2. Insulin resistance Melissa Leach is doing well on metformin, and she is working on diet and weight loss.  3. Other depression with emotional eating Melissa Leach still struggles with cravings and emotional eating behaviors. She is open to increasing her dose to help with this.  Assessment/Plan:   1. Vitamin D deficiency Low Vitamin D level contributes to fatigue and are associated with obesity, breast, and colon cancer. We will refill prescription Vitamin D for 1 month. Melissa Leach will follow-up for routine testing of Vitamin D, at least 2-3 times per year to avoid over-replacement.  - Vitamin D, Ergocalciferol, (DRISDOL) 1.25 MG (50000 UNIT) CAPS capsule; TAKE 1 CAPSULE BY MOUTH ONE TIME PER WEEK  Dispense: 4 capsule; Refill: 0  2. Insulin resistance Melissa Leach will continue to work on weight loss, exercise, and decreasing simple carbohydrates to help decrease the risk of diabetes. We will refill metformin for 1 month. Melissa Leach agreed to follow-up with [place] as directed to closely monitor her progress.  - metFORMIN (GLUCOPHAGE) 500 MG tablet; Take one tablet daily with breakfast.   Dispense: 30 tablet; Refill: 0  3. Other depression with emotional eating Behavior modification techniques were discussed today to help Melissa Leach deal with her emotional/non-hunger eating behaviors. Melissa Leach agreed to increase Wellbutrin SR to 200 mg q daily with no refills. Orders and follow up as documented in patient record.   - buPROPion (WELLBUTRIN SR) 200 MG 12 hr tablet; Take 1 tablet (200 mg total) by mouth daily.  Dispense: 30 tablet; Refill: 0  4. Obesity with current BMI 34.1 Melissa Leach is currently in the action stage of change. As such, her goal is to continue with weight loss efforts. She has agreed to the Category 2 Plan or keeping a food journal and adhering to recommended goals of 1200-1400 calories and 80+ grams of protein daily.   Exercise goals: As is.  Behavioral modification strategies: meal planning and cooking strategies.  Melissa Leach has agreed to follow-up with our clinic in 4 weeks. She was informed of the importance of frequent follow-up visits to maximize her success with intensive lifestyle modifications for her multiple health conditions.   Objective:   Blood pressure 100/69, pulse 82, temperature 97.9 F (36.6 C), height 5\' 1"  (1.549 m), weight 180 lb (81.6 kg), last menstrual period 12/27/2002, SpO2 100 %. Body mass index is 34.01 kg/m.  General: Cooperative, alert, well developed, in no acute distress. HEENT: Conjunctivae and lids unremarkable. Cardiovascular: Regular rhythm.  Lungs: Normal work of breathing. Neurologic: No focal deficits.   Lab Results  Component Value Date   CREATININE 0.82 05/29/2020   BUN 20 05/29/2020   NA 142 05/29/2020  K 4.8 05/29/2020   CL 108 (H) 05/29/2020   CO2 21 05/29/2020   Lab Results  Component Value Date   ALT 16 05/29/2020   AST 23 05/29/2020   ALKPHOS 98 05/29/2020   BILITOT 0.3 05/29/2020   Lab Results  Component Value Date   HGBA1C 5.3 05/29/2020   HGBA1C 5.4 03/14/2020   HGBA1C 5.5 11/01/2019   HGBA1C  5.5 07/22/2019   HGBA1C 5.6 04/08/2019   Lab Results  Component Value Date   INSULIN 9.2 05/29/2020   INSULIN 8.3 11/01/2019   INSULIN 9.8 07/22/2019   INSULIN 12.1 04/08/2019   Lab Results  Component Value Date   TSH 0.399 (L) 05/29/2020   Lab Results  Component Value Date   CHOL 140 05/29/2020   HDL 41 05/29/2020   LDLCALC 68 05/29/2020   TRIG 186 (H) 05/29/2020   Lab Results  Component Value Date   VD25OH 55.3 05/29/2020   VD25OH 60.7 11/01/2019   VD25OH 80.9 07/22/2019   Lab Results  Component Value Date   WBC 6.2 04/04/2020   HGB 12.9 04/04/2020   HCT 40.9 04/04/2020   MCV 98.8 04/04/2020   PLT 268 04/04/2020   No results found for: IRON, TIBC, FERRITIN  Obesity Behavioral Intervention:   Approximately 15 minutes were spent on the discussion below.  ASK: We discussed the diagnosis of obesity with Melissa Leach today and Melissa Leach agreed to give [place] permission to discuss obesity behavioral modification therapy today.  ASSESS: Melissa Leach has the diagnosis of obesity and her BMI today is 34.1. Melissa Leach is in the action stage of change.   ADVISE: Melissa Leach was educated on the multiple health risks of obesity as well as the benefit of weight loss to improve her health. She was advised of the need for long term treatment and the importance of lifestyle modifications to improve her current health and to decrease her risk of future health problems.  AGREE: Multiple dietary modification options and treatment options were discussed and Melissa Leach agreed to follow the recommendations documented in the above note.  ARRANGE: Melissa Leach was educated on the importance of frequent visits to treat obesity as outlined per CMS and USPSTF guidelines and agreed to schedule her next follow up appointment today.  Attestation Statements:   Reviewed by clinician on day of visit: allergies, medications, problem list, medical history, surgical history, family history, social history, and previous  encounter notes.   I, Burt Knack, am acting as transcriptionist for Quillian Quince, MD.  I have reviewed the above documentation for accuracy and completeness, and I agree with the above. -  Quillian Quince, MD

## 2020-12-15 ENCOUNTER — Other Ambulatory Visit (INDEPENDENT_AMBULATORY_CARE_PROVIDER_SITE_OTHER): Payer: Self-pay | Admitting: Family Medicine

## 2020-12-15 DIAGNOSIS — F3289 Other specified depressive episodes: Secondary | ICD-10-CM

## 2020-12-15 NOTE — Telephone Encounter (Signed)
Dr.Beasley 

## 2020-12-19 DIAGNOSIS — L570 Actinic keratosis: Secondary | ICD-10-CM | POA: Diagnosis not present

## 2020-12-19 DIAGNOSIS — L853 Xerosis cutis: Secondary | ICD-10-CM | POA: Diagnosis not present

## 2020-12-19 DIAGNOSIS — L309 Dermatitis, unspecified: Secondary | ICD-10-CM | POA: Diagnosis not present

## 2020-12-19 DIAGNOSIS — L304 Erythema intertrigo: Secondary | ICD-10-CM | POA: Diagnosis not present

## 2020-12-26 ENCOUNTER — Ambulatory Visit: Payer: Medicare HMO | Admitting: Neurology

## 2020-12-28 ENCOUNTER — Ambulatory Visit: Payer: Medicare HMO | Admitting: Neurology

## 2020-12-28 ENCOUNTER — Other Ambulatory Visit: Payer: Self-pay

## 2020-12-28 DIAGNOSIS — G43709 Chronic migraine without aura, not intractable, without status migrainosus: Secondary | ICD-10-CM | POA: Diagnosis not present

## 2020-12-28 NOTE — Progress Notes (Signed)
Botox- 100 units x 2 vials Lot: [id number] Expiration: 10/2022 NDC: 1791-5056-97  Dx: X48.016 B/B

## 2020-12-28 NOTE — Progress Notes (Signed)
Consent Form Botulism Toxin Injection For Chronic Migraine  Doing great, >>70% decrease migraines.   Reviewed orally with patient, additionally signature is on file:  Botulism toxin has been approved by the Federal drug administration for treatment of chronic migraine. Botulism toxin does not cure chronic migraine and it may not be effective in some patients.  The administration of botulism toxin is accomplished by injecting a small amount of toxin into the muscles of the neck and head. Dosage must be titrated for each individual. Any benefits resulting from botulism toxin tend to wear off after 3 months with a repeat injection required if benefit is to be maintained. Injections are usually done every 3-4 months with maximum effect peak achieved by about 2 or 3 weeks. Botulism toxin is expensive and you should be sure of what costs you will incur resulting from the injection.  The side effects of botulism toxin use for chronic migraine may include:   -Transient, and usually mild, facial weakness with facial injections  -Transient, and usually mild, head or neck weakness with head/neck injections  -Reduction or loss of forehead facial animation due to forehead muscle weakness  -Eyelid drooping  -Dry eye  -Pain at the site of injection or bruising at the site of injection  -Double vision  -Potential unknown long term risks  Contraindications: You should not have Botox if you are pregnant, nursing, allergic to albumin, have an infection, skin condition, or muscle weakness at the site of the injection, or have myasthenia gravis, Lambert-Eaton syndrome, or ALS.  It is also possible that as with any injection, there may be an allergic reaction or no effect from the medication. Reduced effectiveness after repeated injections is sometimes seen and rarely infection at the injection site may occur. All care will be taken to prevent these side effects. If therapy is given over a long time, atrophy and  wasting in the muscle injected may occur. Occasionally the patient's become refractory to treatment because they develop antibodies to the toxin. In this event, therapy needs to be modified.  I have read the above information and consent to the administration of botulism toxin.    BOTOX PROCEDURE NOTE FOR MIGRAINE HEADACHE    Contraindications and precautions discussed with patient(above). Aseptic procedure was observed and patient tolerated procedure. Procedure performed by Dr. Artemio Aly  The condition has existed for more than 6 months, and pt does not have a diagnosis of ALS, Myasthenia Gravis or Lambert-Eaton Syndrome.  Risks and benefits of injections discussed and pt agrees to proceed with the procedure.  Written consent obtained  These injections are medically necessary. Pt  receives good benefits from these injections. These injections do not cause sedations or hallucinations which the oral therapies may cause.  Description of procedure:  The patient was placed in a sitting position. The standard protocol was used for Botox as follows, with 5 units of Botox injected at each site:   -Procerus muscle, midline injection  -Corrugator muscle, bilateral injection  -Frontalis muscle, bilateral injection, with 2 sites each side, medial injection was performed in the upper one third of the frontalis muscle, in the region vertical from the medial inferior edge of the superior orbital rim. The lateral injection was again in the upper one third of the forehead vertically above the lateral limbus of the cornea, 1.5 cm lateral to the medial injection site.  -Temporalis muscle injection, 4 sites, bilaterally. The first injection was 3 cm above the tragus of the ear, second injection site  was 1.5 cm to 3 cm up from the first injection site in line with the tragus of the ear. The third injection site was 1.5-3 cm forward between the first 2 injection sites. The fourth injection site was 1.5 cm  posterior to the second injection site.   -Occipitalis muscle injection, 3 sites, bilaterally. The first injection was done one half way between the occipital protuberance and the tip of the mastoid process behind the ear. The second injection site was done lateral and superior to the first, 1 fingerbreadth from the first injection. The third injection site was 1 fingerbreadth superiorly and medially from the first injection site.  -Cervical paraspinal muscle injection, 2 sites, bilateral knee first injection site was 1 cm from the midline of the cervical spine, 3 cm inferior to the lower border of the occipital protuberance. The second injection site was 1.5 cm superiorly and laterally to the first injection site.  -Trapezius muscle injection was performed at 3 sites, bilaterally. The first injection site was in the upper trapezius muscle halfway between the inflection point of the neck, and the acromion. The second injection site was one half way between the acromion and the first injection site. The third injection was done between the first injection site and the inflection point of the neck.   Will return for repeat injection in 3 months.   155 units of Botox was used, 45u Botox not injected was wasted. The patient tolerated the procedure well, there were no complications of the above procedure.

## 2021-01-05 ENCOUNTER — Other Ambulatory Visit (INDEPENDENT_AMBULATORY_CARE_PROVIDER_SITE_OTHER): Payer: Self-pay | Admitting: Family Medicine

## 2021-01-05 DIAGNOSIS — E8881 Metabolic syndrome: Secondary | ICD-10-CM

## 2021-01-07 ENCOUNTER — Other Ambulatory Visit (INDEPENDENT_AMBULATORY_CARE_PROVIDER_SITE_OTHER): Payer: Self-pay | Admitting: Family Medicine

## 2021-01-07 DIAGNOSIS — F3289 Other specified depressive episodes: Secondary | ICD-10-CM

## 2021-01-08 ENCOUNTER — Encounter (INDEPENDENT_AMBULATORY_CARE_PROVIDER_SITE_OTHER): Payer: Self-pay | Admitting: Emergency Medicine

## 2021-01-08 NOTE — Telephone Encounter (Signed)
Last OV with Dr. Beasley 

## 2021-01-08 NOTE — Telephone Encounter (Signed)
Will discuss at next office visit

## 2021-01-08 NOTE — Telephone Encounter (Signed)
Mychart msg has been sent to patient to see if she will have enough of meds to last till next office visit

## 2021-01-09 ENCOUNTER — Other Ambulatory Visit (INDEPENDENT_AMBULATORY_CARE_PROVIDER_SITE_OTHER): Payer: Self-pay | Admitting: Emergency Medicine

## 2021-01-09 DIAGNOSIS — F3289 Other specified depressive episodes: Secondary | ICD-10-CM

## 2021-01-09 MED ORDER — BUPROPION HCL ER (SR) 200 MG PO TB12: 200.0000 mg | ORAL_TABLET | Freq: Every day | ORAL | 0 refills | Status: DC

## 2021-01-09 NOTE — Telephone Encounter (Signed)
Please refill wellbutrin x 1

## 2021-01-11 ENCOUNTER — Ambulatory Visit (INDEPENDENT_AMBULATORY_CARE_PROVIDER_SITE_OTHER): Payer: Medicare HMO | Admitting: Family Medicine

## 2021-01-11 ENCOUNTER — Encounter (INDEPENDENT_AMBULATORY_CARE_PROVIDER_SITE_OTHER): Payer: Self-pay | Admitting: Family Medicine

## 2021-01-11 ENCOUNTER — Other Ambulatory Visit: Payer: Self-pay

## 2021-01-11 VITALS — BP 109/71 | HR 67 | Temp 98.3°F | Ht 61.0 in | Wt 183.0 lb

## 2021-01-11 DIAGNOSIS — E559 Vitamin D deficiency, unspecified: Secondary | ICD-10-CM | POA: Diagnosis not present

## 2021-01-11 DIAGNOSIS — R7303 Prediabetes: Secondary | ICD-10-CM | POA: Diagnosis not present

## 2021-01-11 DIAGNOSIS — E038 Other specified hypothyroidism: Secondary | ICD-10-CM

## 2021-01-11 DIAGNOSIS — Z6841 Body Mass Index (BMI) 40.0 and over, adult: Secondary | ICD-10-CM

## 2021-01-11 DIAGNOSIS — R69 Illness, unspecified: Secondary | ICD-10-CM | POA: Diagnosis not present

## 2021-01-11 DIAGNOSIS — F3289 Other specified depressive episodes: Secondary | ICD-10-CM

## 2021-01-11 DIAGNOSIS — E7849 Other hyperlipidemia: Secondary | ICD-10-CM

## 2021-01-11 DIAGNOSIS — E66813 Obesity, class 3: Secondary | ICD-10-CM

## 2021-01-11 MED ORDER — TOPIRAMATE 25 MG PO TABS: 25.0000 mg | ORAL_TABLET | Freq: Every day | ORAL | 0 refills | Status: DC

## 2021-01-11 MED ORDER — VITAMIN D (ERGOCALCIFEROL) 1.25 MG (50000 UNIT) PO CAPS: ORAL_CAPSULE | ORAL | 0 refills | Status: DC

## 2021-01-11 MED ORDER — BUPROPION HCL ER (SR) 200 MG PO TB12: 200.0000 mg | ORAL_TABLET | Freq: Every day | ORAL | 0 refills | Status: DC

## 2021-01-11 MED ORDER — METFORMIN HCL 500 MG PO TABS: ORAL_TABLET | ORAL | 0 refills | Status: DC

## 2021-01-11 NOTE — Progress Notes (Signed)
Chief Complaint:   OBESITY Melissa Leach is here to discuss her progress with her obesity treatment plan along with follow-up of her obesity related diagnoses. Melissa Leach is on the Category 2 Plan or keeping a food journal and adhering to recommended goals of 1200-1400 calories and 80+ grams of protein daily and states she is following her eating plan approximately 60% of the time. Melissa Leach states she walks for 45 minutes 7 times per week.  Today's visit was #: 25 Starting weight: 216 lbs Starting date: 04/08/2019 Today's weight: 183 lbs Today's date: 01/11/2021 Total lbs lost to date: 33 Total lbs lost since last in-office visit: 0  Interim History: Melissa Leach has gotten off track with her snacking. She notes increased boredom eating and is working on decreasing this.  Subjective:   1. Pre-diabetes Melissa Leach is stable on metformin, and she is due for labs. She is working on decreasing her snacking.  2. Vitamin D deficiency Melissa Leach is on Vit D, and she denies nausea, vomiting, or muscle weakness.  3. Other hyperlipidemia Melissa Leach is working on diet an exercise, and she is due for labs.  4. Other specified hypothyroidism Melissa Leach is taking her Synthroid and Wellbutrin at the same time which may be affecting the absorption of Synthroid.  5. Other depression with emotional eating Melissa Leach notes increased cravings. She has temptations around her and more during the day.  Assessment/Plan:   1. Pre-diabetes Melissa Leach will continue to work on weight loss, exercise, and decreasing simple carbohydrates to help decrease the risk of diabetes. We will check labs today, and we will refill metformin for 1 month.  - CMP14+EGFR - Insulin, random - Hemoglobin A1c - metFORMIN (GLUCOPHAGE) 500 MG tablet; Take one tablet daily with breakfast.  Dispense: 30 tablet; Refill: 0  2. Vitamin D deficiency Low Vitamin D level contributes to fatigue and are associated with obesity, breast, and colon cancer. We will  check labs today, and we will refill prescription Vitamin D for 1 month. Melissa Leach will follow-up for routine testing of Vitamin D, at least 2-3 times per year to avoid over-replacement.  - VITAMIN D 25 Hydroxy (Vit-D Deficiency, Fractures) - Vitamin D, Ergocalciferol, (DRISDOL) 1.25 MG (50000 UNIT) CAPS capsule; TAKE 1 CAPSULE BY MOUTH ONE TIME PER WEEK  Dispense: 4 capsule; Refill: 0  3. Other hyperlipidemia Cardiovascular risk and specific lipid/LDL goals reviewed. We discussed several lifestyle modifications today. We will check labs today. Melissa Leach will continue to work on diet, exercise and weight loss efforts. Orders and follow up as documented in patient record.   - Lipid Panel With LDL/HDL Ratio  4. Other specified hypothyroidism We will check labs today, and will follow up at Melissa Leach's next visit. Orders and follow up as documented in patient record.  - T3 - T4, free - TSH  5. Other depression with emotional eating Emotional eating behavior strategies were discussed today to help Melissa Leach deal with her emotional/non-hunger eating behaviors. Melissa Leach agreed to start Topamax 25 mg qhs with no refills, and we will refill Wellbutrin SR for 1 month. Orders and follow up as documented in patient record.   - buPROPion (WELLBUTRIN SR) 200 MG 12 hr tablet; Take 1 tablet (200 mg total) by mouth daily.  Dispense: 30 tablet; Refill: 0 - topiramate (TOPAMAX) 25 MG tablet; Take 1 tablet (25 mg total) by mouth daily.  Dispense: 30 tablet; Refill: 0  6. Obesity with current BMI 34.6 Melissa Leach is currently in the action stage of change. As such, her goal is  to continue with weight loss efforts. She has agreed to keeping a food journal and adhering to recommended goals of 1200-1400 calories and 80+ grams of protein daily.   Exercise goals: As is.  Behavioral modification strategies: increasing lean protein intake and meal planning and cooking strategies.  Melissa Leach has agreed to follow-up with our  clinic in 3 to 4 weeks. She was informed of the importance of frequent follow-up visits to maximize her success with intensive lifestyle modifications for her multiple health conditions.   Melissa Leach was informed we would discuss her lab results at her next visit unless there is a critical issue that needs to be addressed sooner. Melissa Leach agreed to keep her next visit at the agreed upon time to discuss these results.  Objective:   Blood pressure 109/71, pulse 67, temperature 98.3 F (36.8 C), height _0  (1.549 m), weight 183 lb (83 kg), last menstrual period 12/27/2002, SpO2 97 %. Body mass index is 34.58 kg/m.  General: Cooperative, alert, well developed, in no acute distress. HEENT: Conjunctivae and lids unremarkable. Cardiovascular: Regular rhythm.  Lungs: Normal work of breathing. Neurologic: No focal deficits.   Lab Results  Component Value Date   CREATININE 0.82 05/29/2020   BUN 20 05/29/2020   NA 142 05/29/2020   K 4.8 05/29/2020   CL 108 (H) 05/29/2020   CO2 21 05/29/2020   Lab Results  Component Value Date   ALT 16 05/29/2020   AST 23 05/29/2020   ALKPHOS 98 05/29/2020   BILITOT 0.3 05/29/2020   Lab Results  Component Value Date   HGBA1C 5.3 05/29/2020   HGBA1C 5.4 03/14/2020   HGBA1C 5.5 11/01/2019   HGBA1C 5.5 07/22/2019   HGBA1C 5.6 04/08/2019   Lab Results  Component Value Date   INSULIN 9.2 05/29/2020   INSULIN 8.3 11/01/2019   INSULIN 9.8 07/22/2019   INSULIN 12.1 04/08/2019   Lab Results  Component Value Date   TSH 0.399 (L) 05/29/2020   Lab Results  Component Value Date   CHOL 140 05/29/2020   HDL 41 05/29/2020   LDLCALC 68 05/29/2020   TRIG 186 (H) 05/29/2020   Lab Results  Component Value Date   VD25OH 55.3 05/29/2020   VD25OH 60.7 11/01/2019   VD25OH 80.9 07/22/2019   Lab Results  Component Value Date   WBC 6.2 04/04/2020   HGB 12.9 04/04/2020   HCT 40.9 04/04/2020   MCV 98.8 04/04/2020   PLT 268 04/04/2020   No results found  for: IRON, TIBC, FERRITIN  Obesity Behavioral Intervention:   Approximately 15 minutes were spent on the discussion below.  ASK: We discussed the diagnosis of obesity with Niyah today and Erial agreed to give [place] permission to discuss obesity behavioral modification therapy today.  ASSESS: Allana has the diagnosis of obesity and her BMI today is 34.6. Aalliyah is in the action stage of change.   ADVISE: Amiri was educated on the multiple health risks of obesity as well as the benefit of weight loss to improve her health. She was advised of the need for long term treatment and the importance of lifestyle modifications to improve her current health and to decrease her risk of future health problems.  AGREE: Multiple dietary modification options and treatment options were discussed and Cammy agreed to follow the recommendations documented in the above note.  ARRANGE: Brigette was educated on the importance of frequent visits to treat obesity as outlined per CMS and USPSTF guidelines and agreed to schedule her next follow up  appointment today.  Attestation Statements:   Reviewed by clinician on day of visit: allergies, medications, problem list, medical history, surgical history, family history, social history, and previous encounter notes.   I, Trixie Dredge, am acting as transcriptionist for Dennard Nip, MD.  I have reviewed the above documentation for accuracy and completeness, and I agree with the above. -  Dennard Nip, MD

## 2021-01-12 LAB — CMP14+EGFR
ALT: 17 IU/L (ref 0–32)
AST: 27 IU/L (ref 0–40)
Albumin/Globulin Ratio: 2 (ref 1.2–2.2)
Albumin: 5 g/dL — ABNORMAL HIGH (ref 3.8–4.8)
Alkaline Phosphatase: 125 IU/L — ABNORMAL HIGH (ref 44–121)
BUN/Creatinine Ratio: 31 — ABNORMAL HIGH (ref 12–28)
BUN: 32 mg/dL — ABNORMAL HIGH (ref 8–27)
Bilirubin Total: 0.4 mg/dL (ref 0.0–1.2)
CO2: 22 mmol/L (ref 20–29)
Calcium: 9.8 mg/dL (ref 8.7–10.3)
Chloride: 97 mmol/L (ref 96–106)
Creatinine, Ser: 1.04 mg/dL — ABNORMAL HIGH (ref 0.57–1.00)
Globulin, Total: 2.5 g/dL (ref 1.5–4.5)
Glucose: 90 mg/dL (ref 70–99)
Potassium: 4.5 mmol/L (ref 3.5–5.2)
Sodium: 137 mmol/L (ref 134–144)
Total Protein: 7.5 g/dL (ref 6.0–8.5)
eGFR: 58 mL/min/{1.73_m2} — ABNORMAL LOW (ref >59)

## 2021-01-12 LAB — T3: T3, Total: 86 ng/dL (ref 71–180)

## 2021-01-12 LAB — LIPID PANEL WITH LDL/HDL RATIO
Cholesterol, Total: 132 mg/dL (ref 100–199)
HDL: 54 mg/dL (ref >39)
LDL Chol Calc (NIH): 61 mg/dL (ref 0–99)
LDL/HDL Ratio: 1.1 ratio (ref 0.0–3.2)
Triglycerides: 91 mg/dL (ref 0–149)
VLDL Cholesterol Cal: 17 mg/dL (ref 5–40)

## 2021-01-12 LAB — HEMOGLOBIN A1C
Est. average glucose Bld gHb Est-mCnc: 108 mg/dL
Hgb A1c MFr Bld: 5.4 % (ref 4.8–5.6)

## 2021-01-12 LAB — T4, FREE: Free T4: 1.41 ng/dL (ref 0.82–1.77)

## 2021-01-12 LAB — INSULIN, RANDOM: INSULIN: 9.1 u[IU]/mL (ref 2.6–24.9)

## 2021-01-12 LAB — TSH: TSH: 2.24 u[IU]/mL (ref 0.450–4.500)

## 2021-01-12 LAB — VITAMIN D 25 HYDROXY (VIT D DEFICIENCY, FRACTURES): Vit D, 25-Hydroxy: 65.3 ng/mL (ref 30.0–100.0)

## 2021-01-15 ENCOUNTER — Encounter (INDEPENDENT_AMBULATORY_CARE_PROVIDER_SITE_OTHER): Payer: Self-pay | Admitting: Emergency Medicine

## 2021-01-15 NOTE — Telephone Encounter (Signed)
Dr.Beasley 

## 2021-01-15 NOTE — Telephone Encounter (Signed)
Mychart has been sent to patient. 

## 2021-01-17 NOTE — Telephone Encounter (Signed)
Please review

## 2021-01-22 DIAGNOSIS — M79641 Pain in right hand: Secondary | ICD-10-CM | POA: Diagnosis not present

## 2021-01-22 DIAGNOSIS — Z96651 Presence of right artificial knee joint: Secondary | ICD-10-CM | POA: Diagnosis not present

## 2021-02-03 ENCOUNTER — Other Ambulatory Visit (INDEPENDENT_AMBULATORY_CARE_PROVIDER_SITE_OTHER): Payer: Self-pay | Admitting: Family Medicine

## 2021-02-03 DIAGNOSIS — F3289 Other specified depressive episodes: Secondary | ICD-10-CM

## 2021-02-05 NOTE — Telephone Encounter (Signed)
Last OV with Dr. Beasley 

## 2021-02-08 ENCOUNTER — Encounter (INDEPENDENT_AMBULATORY_CARE_PROVIDER_SITE_OTHER): Payer: Self-pay | Admitting: Family Medicine

## 2021-02-08 ENCOUNTER — Ambulatory Visit (INDEPENDENT_AMBULATORY_CARE_PROVIDER_SITE_OTHER): Payer: Medicare HMO | Admitting: Family Medicine

## 2021-02-08 ENCOUNTER — Other Ambulatory Visit: Payer: Self-pay

## 2021-02-08 VITALS — BP 102/68 | HR 82 | Temp 97.5°F | Ht 61.0 in | Wt 181.0 lb

## 2021-02-08 DIAGNOSIS — E559 Vitamin D deficiency, unspecified: Secondary | ICD-10-CM

## 2021-02-08 DIAGNOSIS — Z6841 Body Mass Index (BMI) 40.0 and over, adult: Secondary | ICD-10-CM

## 2021-02-08 DIAGNOSIS — R7303 Prediabetes: Secondary | ICD-10-CM

## 2021-02-08 DIAGNOSIS — F3289 Other specified depressive episodes: Secondary | ICD-10-CM

## 2021-02-08 DIAGNOSIS — R69 Illness, unspecified: Secondary | ICD-10-CM | POA: Diagnosis not present

## 2021-02-08 MED ORDER — METFORMIN HCL 500 MG PO TABS: ORAL_TABLET | ORAL | 0 refills | Status: DC

## 2021-02-08 MED ORDER — TOPIRAMATE 25 MG PO TABS: 25.0000 mg | ORAL_TABLET | Freq: Every day | ORAL | 0 refills | Status: DC

## 2021-02-08 MED ORDER — BUPROPION HCL ER (SR) 200 MG PO TB12: 200.0000 mg | ORAL_TABLET | Freq: Every day | ORAL | 0 refills | Status: DC

## 2021-02-08 MED ORDER — VITAMIN D (ERGOCALCIFEROL) 1.25 MG (50000 UNIT) PO CAPS: ORAL_CAPSULE | ORAL | 0 refills | Status: DC

## 2021-02-08 NOTE — Progress Notes (Signed)
Chief Complaint:   OBESITY Baby is here to discuss her progress with her obesity treatment plan along with follow-up of her obesity related diagnoses. Melissa Leach is on keeping a food journal and adhering to recommended goals of 1200-1400 calories and 80+ grams of protein daily and states she is following her eating plan approximately 60% of the time. Melissa Leach states she is walking 3 miles for 70 minutes 7 times per week.  Today's visit was #: 26 Starting weight: 216 lbs Starting date: 04/08/2019 Today's weight: 181 lbs Today's date: 02/08/2021 Total lbs lost to date: 35 Total lbs lost since last in-office visit: 2  Interim History: Melissa Leach continues to work on weight loss and she has lost another 2 lbs. She will be traveling for Thanksgiving and she is making eating strategies.  Subjective:   1. Vitamin D deficiency Melissa Leach is on Vit D, and she requests a refill. She denies signs of over-replacement.   2. Pre-diabetes Melissa Leach is stable on metformin, and she denies nausea, vomiting, or muscle weakness.  3. Other depression with emotional eating Melissa Leach is working on decreasing emotional eating behaviors. She feels she is doing well on the combinations of medications. No side effects were noted.  Assessment/Plan:   1. Vitamin D deficiency Low Vitamin D level contributes to fatigue and are associated with obesity, breast, and colon cancer. We will refill prescription Vitamin D for 1 month. Melissa Leach will follow-up for routine testing of Vitamin D, at least 2-3 times per year to avoid over-replacement.  - Vitamin D, Ergocalciferol, (DRISDOL) 1.25 MG (50000 UNIT) CAPS capsule; TAKE 1 CAPSULE BY MOUTH ONE TIME PER WEEK  Dispense: 4 capsule; Refill: 0  2. Pre-diabetes Melissa Leach will continue to work on weight loss, exercise, and decreasing simple carbohydrates to help decrease the risk of diabetes. We will refill metformin for 1 month.  - metFORMIN (GLUCOPHAGE) 500 MG tablet; Take one  tablet daily with breakfast.  Dispense: 30 tablet; Refill: 0  3. Other depression with emotional eating Behavior modification techniques were discussed today to help Melissa Leach deal with her emotional/non-hunger eating behaviors. We will refill Topamax and Wellbutrin SR for 1 month. Orders and follow up as documented in patient record.   - topiramate (TOPAMAX) 25 MG tablet; Take 1 tablet (25 mg total) by mouth daily.  Dispense: 30 tablet; Refill: 0 - buPROPion (WELLBUTRIN SR) 200 MG 12 hr tablet; Take 1 tablet (200 mg total) by mouth daily.  Dispense: 30 tablet; Refill: 0  4. Obesity with current BMI 34.2 Melissa Leach is currently in the action stage of change. As such, her goal is to continue with weight loss efforts. She has agreed to the Category 2 Plan.   Exercise goals: As is.  Behavioral modification strategies: travel eating strategies and holiday eating strategies .  Melissa Leach has agreed to follow-up with our clinic in 4 weeks. She was informed of the importance of frequent follow-up visits to maximize her success with intensive lifestyle modifications for her multiple health conditions.   Objective:   Blood pressure 102/68, pulse 82, temperature (!) 97.5 F (36.4 C), height 5\' 1"  (1.549 m), weight 181 lb (82.1 kg), last menstrual period 12/27/2002, SpO2 98 %. Body mass index is 34.2 kg/m.  General: Cooperative, alert, well developed, in no acute distress. HEENT: Conjunctivae and lids unremarkable. Cardiovascular: Regular rhythm.  Lungs: Normal work of breathing. Neurologic: No focal deficits.   Lab Results  Component Value Date   CREATININE 1.04 (H) 01/11/2021   BUN 32 (H)  01/11/2021   NA 137 01/11/2021   K 4.5 01/11/2021   CL 97 01/11/2021   CO2 22 01/11/2021   Lab Results  Component Value Date   ALT 17 01/11/2021   AST 27 01/11/2021   ALKPHOS 125 (H) 01/11/2021   BILITOT 0.4 01/11/2021   Lab Results  Component Value Date   HGBA1C 5.4 01/11/2021   HGBA1C 5.3  05/29/2020   HGBA1C 5.4 03/14/2020   HGBA1C 5.5 11/01/2019   HGBA1C 5.5 07/22/2019   Lab Results  Component Value Date   INSULIN 9.1 01/11/2021   INSULIN 9.2 05/29/2020   INSULIN 8.3 11/01/2019   INSULIN 9.8 07/22/2019   INSULIN 12.1 04/08/2019   Lab Results  Component Value Date   TSH 2.240 01/11/2021   Lab Results  Component Value Date   CHOL 132 01/11/2021   HDL 54 01/11/2021   LDLCALC 61 01/11/2021   TRIG 91 01/11/2021   Lab Results  Component Value Date   VD25OH 65.3 01/11/2021   VD25OH 55.3 05/29/2020   VD25OH 60.7 11/01/2019   Lab Results  Component Value Date   WBC 6.2 04/04/2020   HGB 12.9 04/04/2020   HCT 40.9 04/04/2020   MCV 98.8 04/04/2020   PLT 268 04/04/2020   No results found for: IRON, TIBC, FERRITIN  Obesity Behavioral Intervention:   Approximately 15 minutes were spent on the discussion below.  ASK: We discussed the diagnosis of obesity with Melissa Leach today and Melissa Leach agreed to give [place] permission to discuss obesity behavioral modification therapy today.  ASSESS: Melissa Leach has the diagnosis of obesity and her BMI today is 34.2. Melissa Leach is in the action stage of change.   ADVISE: Melissa Leach was educated on the multiple health risks of obesity as well as the benefit of weight loss to improve her health. She was advised of the need for long term treatment and the importance of lifestyle modifications to improve her current health and to decrease her risk of future health problems.  AGREE: Multiple dietary modification options and treatment options were discussed and Melissa Leach agreed to follow the recommendations documented in the above note.  ARRANGE: Melissa Leach was educated on the importance of frequent visits to treat obesity as outlined per CMS and USPSTF guidelines and agreed to schedule her next follow up appointment today.  Attestation Statements:   Reviewed by clinician on day of visit: allergies, medications, problem list, medical history,  surgical history, family history, social history, and previous encounter notes.   I, Trixie Dredge, am acting as transcriptionist for Dennard Nip, MD.  I have reviewed the above documentation for accuracy and completeness, and I agree with the above. -  Dennard Nip, MD

## 2021-02-20 ENCOUNTER — Other Ambulatory Visit (INDEPENDENT_AMBULATORY_CARE_PROVIDER_SITE_OTHER): Payer: Self-pay | Admitting: Family Medicine

## 2021-02-20 DIAGNOSIS — R7303 Prediabetes: Secondary | ICD-10-CM

## 2021-02-28 DIAGNOSIS — G5603 Carpal tunnel syndrome, bilateral upper limbs: Secondary | ICD-10-CM | POA: Diagnosis not present

## 2021-02-28 DIAGNOSIS — M13842 Other specified arthritis, left hand: Secondary | ICD-10-CM | POA: Diagnosis not present

## 2021-02-28 DIAGNOSIS — M13849 Other specified arthritis, unspecified hand: Secondary | ICD-10-CM | POA: Diagnosis not present

## 2021-03-03 ENCOUNTER — Other Ambulatory Visit (INDEPENDENT_AMBULATORY_CARE_PROVIDER_SITE_OTHER): Payer: Self-pay | Admitting: Family Medicine

## 2021-03-03 DIAGNOSIS — F3289 Other specified depressive episodes: Secondary | ICD-10-CM

## 2021-03-05 NOTE — Telephone Encounter (Signed)
LAST APPOINTMENT DATE: 02/08/21 NEXT APPOINTMENT DATE: 03/08/21   [id number] - [place], [place] - [address] AT [address] OF [facility] [address] [place] [address] Phone: [phone] Fax: [phone]  Patient is requesting a refill of the following medications: Requested Prescriptions   Pending Prescriptions Disp Refills   topiramate (TOPAMAX) 25 MG tablet [Pharmacy Med Name: TOPIRAMATE 25 MG TABLET] 30 tablet 0    Sig: TAKE 1 TABLET (25 MG TOTAL) BY MOUTH DAILY.    Date last filled: 02/08/21 Previously prescribed by Dr. Dalbert Garnet  Lab Results  Component Value Date   HGBA1C 5.4 01/11/2021   HGBA1C 5.3 05/29/2020   HGBA1C 5.4 03/14/2020   Lab Results  Component Value Date   LDLCALC 61 01/11/2021   CREATININE 1.04 (H) 01/11/2021   Lab Results  Component Value Date   VD25OH 65.3 01/11/2021   VD25OH 55.3 05/29/2020   VD25OH 60.7 11/01/2019    BP Readings from Last 3 Encounters:  02/08/21 102/68  01/11/21 109/71  12/14/20 100/69

## 2021-03-06 ENCOUNTER — Other Ambulatory Visit (INDEPENDENT_AMBULATORY_CARE_PROVIDER_SITE_OTHER): Payer: Self-pay | Admitting: Family Medicine

## 2021-03-06 DIAGNOSIS — F3289 Other specified depressive episodes: Secondary | ICD-10-CM

## 2021-03-06 NOTE — Telephone Encounter (Signed)
LAST APPOINTMENT DATE: 02/08/21 NEXT APPOINTMENT DATE: 03/08/21   [id number] - [place], [place] - [address] AT [address] OF [facility] [address] [place] [address] Phone: [phone] Fax: [phone]  Patient is requesting a refill of the following medications: Requested Prescriptions   Pending Prescriptions Disp Refills   buPROPion (WELLBUTRIN SR) 150 MG 12 hr tablet [Pharmacy Med Name: BUPROPION HCL SR 150 MG TABLET] 30 tablet 0    Sig: TAKE 1 TABLET (150 MG TOTAL) BY MOUTH IN THE MORNING.    Date last filled: 02/08/2021 Previously prescribed by Dr. Dalbert Garnet  Lab Results  Component Value Date   HGBA1C 5.4 01/11/2021   HGBA1C 5.3 05/29/2020   HGBA1C 5.4 03/14/2020   Lab Results  Component Value Date   LDLCALC 61 01/11/2021   CREATININE 1.04 (H) 01/11/2021   Lab Results  Component Value Date   VD25OH 65.3 01/11/2021   VD25OH 55.3 05/29/2020   VD25OH 60.7 11/01/2019    BP Readings from Last 3 Encounters:  02/08/21 102/68  01/11/21 109/71  12/14/20 100/69

## 2021-03-08 ENCOUNTER — Other Ambulatory Visit: Payer: Self-pay

## 2021-03-08 ENCOUNTER — Encounter (INDEPENDENT_AMBULATORY_CARE_PROVIDER_SITE_OTHER): Payer: Self-pay | Admitting: Family Medicine

## 2021-03-08 ENCOUNTER — Ambulatory Visit (INDEPENDENT_AMBULATORY_CARE_PROVIDER_SITE_OTHER): Payer: Medicare HMO | Admitting: Family Medicine

## 2021-03-08 VITALS — HR 65 | Temp 97.5°F | Ht 61.0 in | Wt 180.0 lb

## 2021-03-08 DIAGNOSIS — R7303 Prediabetes: Secondary | ICD-10-CM

## 2021-03-08 DIAGNOSIS — E559 Vitamin D deficiency, unspecified: Secondary | ICD-10-CM | POA: Diagnosis not present

## 2021-03-08 DIAGNOSIS — R69 Illness, unspecified: Secondary | ICD-10-CM | POA: Diagnosis not present

## 2021-03-08 DIAGNOSIS — Z6841 Body Mass Index (BMI) 40.0 and over, adult: Secondary | ICD-10-CM | POA: Diagnosis not present

## 2021-03-08 DIAGNOSIS — F3289 Other specified depressive episodes: Secondary | ICD-10-CM | POA: Diagnosis not present

## 2021-03-08 MED ORDER — TOPIRAMATE 25 MG PO TABS
25.0000 mg | ORAL_TABLET | Freq: Every day | ORAL | 0 refills | Status: DC
Start: 2021-03-08 — End: 2021-04-12

## 2021-03-08 MED ORDER — METFORMIN HCL 500 MG PO TABS: ORAL_TABLET | ORAL | 0 refills | Status: DC

## 2021-03-08 MED ORDER — VITAMIN D (ERGOCALCIFEROL) 1.25 MG (50000 UNIT) PO CAPS: ORAL_CAPSULE | ORAL | 0 refills | Status: DC

## 2021-03-08 MED ORDER — BUPROPION HCL ER (SR) 200 MG PO TB12: 200.0000 mg | ORAL_TABLET | Freq: Every day | ORAL | 0 refills | Status: DC

## 2021-03-08 NOTE — Progress Notes (Signed)
Chief Complaint:   OBESITY Deriah is here to discuss her progress with her obesity treatment plan along with follow-up of her obesity related diagnoses. Elie is on keeping a food journal and adhering to recommended goals of 1200-1400 calories and 80 grams protein and states she is following her eating plan approximately 30% of the time. Marijah states she is walking the dog 20-40 minutes 7 times per week.  Today's visit was #: 3 Starting weight: 216 lbs Starting date: 04/08/2019 Today's weight: 180 lbs Today's date: 03/08/2021 Total lbs lost to date: 36 Total lbs lost since last in-office visit: 1  Interim History: Jenny Reichmann is a patient of Dr. Leafy Ro. Per pt, she "can't stay on plan much because of lots of surgeries" and "just not eating what I'm suppose to." Pt is struggling with self motivation to stick to plan.  Subjective:   1. Pre-diabetes Tishauna has a diagnosis of prediabetes based on her elevated HgA1c and was informed this puts her at greater risk of developing diabetes. She continues to work on diet and exercise to decrease her risk of diabetes. She denies nausea or hypoglycemia.  2. Other depression with emotional eating Kezaria is struggling with emotional eating and using food for comfort to the extent that it is negatively impacting her health. She has been working on behavior modification techniques to help reduce her emotional eating. She shows no sign of suicidal or homicidal ideations.  3. Vitamin D deficiency She is currently taking prescription vitamin D 50,000 IU each week. She denies nausea, vomiting or muscle weakness.  Assessment/Plan:  No orders of the defined types were placed in this encounter.   Medications Discontinued During This Encounter  Medication Reason   topiramate (TOPAMAX) 25 MG tablet Reorder   Vitamin D, Ergocalciferol, (DRISDOL) 1.25 MG (50000 UNIT) CAPS capsule Reorder   metFORMIN (GLUCOPHAGE) 500 MG tablet Reorder   buPROPion  (WELLBUTRIN SR) 200 MG 12 hr tablet Reorder     Meds ordered this encounter  Medications   buPROPion (WELLBUTRIN SR) 200 MG 12 hr tablet    Sig: Take 1 tablet (200 mg total) by mouth daily.    Dispense:  30 tablet    Refill:  0   metFORMIN (GLUCOPHAGE) 500 MG tablet    Sig: Take one tablet daily with breakfast.    Dispense:  30 tablet    Refill:  0   topiramate (TOPAMAX) 25 MG tablet    Sig: Take 1 tablet (25 mg total) by mouth daily.    Dispense:  30 tablet    Refill:  0   Vitamin D, Ergocalciferol, (DRISDOL) 1.25 MG (50000 UNIT) CAPS capsule    Sig: TAKE 1 CAPSULE BY MOUTH ONE TIME PER WEEK    Dispense:  4 capsule    Refill:  0     1. Pre-diabetes Natajia will continue to work on weight loss, exercise, and decreasing simple carbohydrates to help decrease the risk of diabetes. Continue current treatment plan.  Refill- metFORMIN (GLUCOPHAGE) 500 MG tablet; Take one tablet daily with breakfast.  Dispense: 30 tablet; Refill: 0  2. Other depression with emotional eating Behavior modification techniques were discussed today to help Isioma deal with her emotional/non-hunger eating behaviors.  Orders and follow up as documented in patient record. Continue current treatment plan.  Refill- buPROPion (WELLBUTRIN SR) 200 MG 12 hr tablet; Take 1 tablet (200 mg total) by mouth daily.  Dispense: 30 tablet; Refill: 0 Refill- topiramate (TOPAMAX) 25 MG tablet; Take 1  tablet (25 mg total) by mouth daily.  Dispense: 30 tablet; Refill: 0  3. Vitamin D deficiency Low Vitamin D level contributes to fatigue and are associated with obesity, breast, and colon cancer. She agrees to continue to take prescription Vitamin D 50,000 IU every week and will follow-up for routine testing of Vitamin D, at least 2-3 times per year to avoid over-replacement.  Refill- Vitamin D, Ergocalciferol, (DRISDOL) 1.25 MG (50000 UNIT) CAPS capsule; TAKE 1 CAPSULE BY MOUTH ONE TIME PER WEEK  Dispense: 4 capsule; Refill:  0  4. Obesity with current BMI of 34.2  Jamariah is currently in the action stage of change. As such, her goal is to continue with weight loss efforts. She has agreed to the Category 2 Plan.   Exercise goals:  As is but increase walking.  Behavioral modification strategies: meal planning and cooking strategies, keeping healthy foods in the home, and planning for success.  Lubna has agreed to follow-up with our clinic in 4 weeks. She was informed of the importance of frequent follow-up visits to maximize her success with intensive lifestyle modifications for her multiple health conditions.   Objective:   Pulse 65, temperature (!) 97.5 F (36.4 C), height 5\' 1"  (1.549 m), weight 180 lb (81.6 kg), last menstrual period 12/27/2002, SpO2 100 %. Body mass index is 34.01 kg/m.  General: Cooperative, alert, well developed, in no acute distress. HEENT: Conjunctivae and lids unremarkable. Cardiovascular: Regular rhythm.  Lungs: Normal work of breathing. Neurologic: No focal deficits.   Lab Results  Component Value Date   CREATININE 1.04 (H) 01/11/2021   BUN 32 (H) 01/11/2021   NA 137 01/11/2021   K 4.5 01/11/2021   CL 97 01/11/2021   CO2 22 01/11/2021   Lab Results  Component Value Date   ALT 17 01/11/2021   AST 27 01/11/2021   ALKPHOS 125 (H) 01/11/2021   BILITOT 0.4 01/11/2021   Lab Results  Component Value Date   HGBA1C 5.4 01/11/2021   HGBA1C 5.3 05/29/2020   HGBA1C 5.4 03/14/2020   HGBA1C 5.5 11/01/2019   HGBA1C 5.5 07/22/2019   Lab Results  Component Value Date   INSULIN 9.1 01/11/2021   INSULIN 9.2 05/29/2020   INSULIN 8.3 11/01/2019   INSULIN 9.8 07/22/2019   INSULIN 12.1 04/08/2019   Lab Results  Component Value Date   TSH 2.240 01/11/2021   Lab Results  Component Value Date   CHOL 132 01/11/2021   HDL 54 01/11/2021   LDLCALC 61 01/11/2021   TRIG 91 01/11/2021   Lab Results  Component Value Date   VD25OH 65.3 01/11/2021   VD25OH 55.3 05/29/2020    VD25OH 60.7 11/01/2019   Lab Results  Component Value Date   WBC 6.2 04/04/2020   HGB 12.9 04/04/2020   HCT 40.9 04/04/2020   MCV 98.8 04/04/2020   PLT 268 04/04/2020   No results found for: IRON, TIBC, FERRITIN  Obesity Behavioral Intervention:   Approximately 15 minutes were spent on the discussion below.  ASK: We discussed the diagnosis of obesity with Rovena today and Stevey agreed to give [place] permission to discuss obesity behavioral modification therapy today.  ASSESS: Treazure has the diagnosis of obesity and her BMI today is 34.2. Tayla is in the action stage of change.   ADVISE: Zamira was educated on the multiple health risks of obesity as well as the benefit of weight loss to improve her health. She was advised of the need for long term treatment and the importance  of lifestyle modifications to improve her current health and to decrease her risk of future health problems.  AGREE: Multiple dietary modification options and treatment options were discussed and Akyra agreed to follow the recommendations documented in the above note.  ARRANGE: Talina was educated on the importance of frequent visits to treat obesity as outlined per CMS and USPSTF guidelines and agreed to schedule her next follow up appointment today.  Attestation Statements:   Reviewed by clinician on day of visit: allergies, medications, problem list, medical history, surgical history, family history, social history, and previous encounter notes.  Edmund Hilda, CMA, am acting as transcriptionist for [employer], DO  I have reviewed the above documentation for accuracy and completeness, and I agree with the above. Carlye Grippe, D.O.  The 21st Century Cures Act was signed into law in 2016 which includes the topic of electronic health records.  This provides immediate access to information in MyChart.  This includes consultation notes, operative notes, office notes, lab results and  pathology reports.  If you have any questions about what you read please let us know at your next visit so we can discuss your concerns and take corrective action if need be.  We are right here with you.

## 2021-03-09 DIAGNOSIS — G5603 Carpal tunnel syndrome, bilateral upper limbs: Secondary | ICD-10-CM | POA: Diagnosis not present

## 2021-03-14 DIAGNOSIS — Z8249 Family history of ischemic heart disease and other diseases of the circulatory system: Secondary | ICD-10-CM | POA: Diagnosis not present

## 2021-03-14 DIAGNOSIS — G8929 Other chronic pain: Secondary | ICD-10-CM | POA: Diagnosis not present

## 2021-03-14 DIAGNOSIS — E785 Hyperlipidemia, unspecified: Secondary | ICD-10-CM | POA: Diagnosis not present

## 2021-03-14 DIAGNOSIS — J309 Allergic rhinitis, unspecified: Secondary | ICD-10-CM | POA: Diagnosis not present

## 2021-03-14 DIAGNOSIS — L719 Rosacea, unspecified: Secondary | ICD-10-CM | POA: Diagnosis not present

## 2021-03-14 DIAGNOSIS — K219 Gastro-esophageal reflux disease without esophagitis: Secondary | ICD-10-CM | POA: Diagnosis not present

## 2021-03-14 DIAGNOSIS — F419 Anxiety disorder, unspecified: Secondary | ICD-10-CM | POA: Diagnosis not present

## 2021-03-14 DIAGNOSIS — G43909 Migraine, unspecified, not intractable, without status migrainosus: Secondary | ICD-10-CM | POA: Diagnosis not present

## 2021-03-14 DIAGNOSIS — E119 Type 2 diabetes mellitus without complications: Secondary | ICD-10-CM | POA: Diagnosis not present

## 2021-03-14 DIAGNOSIS — R69 Illness, unspecified: Secondary | ICD-10-CM | POA: Diagnosis not present

## 2021-03-14 DIAGNOSIS — E669 Obesity, unspecified: Secondary | ICD-10-CM | POA: Diagnosis not present

## 2021-03-14 DIAGNOSIS — M1990 Unspecified osteoarthritis, unspecified site: Secondary | ICD-10-CM | POA: Diagnosis not present

## 2021-03-14 DIAGNOSIS — E039 Hypothyroidism, unspecified: Secondary | ICD-10-CM | POA: Diagnosis not present

## 2021-04-01 ENCOUNTER — Other Ambulatory Visit (INDEPENDENT_AMBULATORY_CARE_PROVIDER_SITE_OTHER): Payer: Self-pay | Admitting: Family Medicine

## 2021-04-01 DIAGNOSIS — F3289 Other specified depressive episodes: Secondary | ICD-10-CM

## 2021-04-03 ENCOUNTER — Ambulatory Visit: Payer: Medicare HMO | Admitting: Neurology

## 2021-04-03 NOTE — Telephone Encounter (Signed)
LOV w/ Dr. Opalski

## 2021-04-04 DIAGNOSIS — B349 Viral infection, unspecified: Secondary | ICD-10-CM | POA: Diagnosis not present

## 2021-04-06 ENCOUNTER — Other Ambulatory Visit: Payer: Self-pay | Admitting: Orthopedic Surgery

## 2021-04-06 ENCOUNTER — Other Ambulatory Visit (INDEPENDENT_AMBULATORY_CARE_PROVIDER_SITE_OTHER): Payer: Self-pay | Admitting: Family Medicine

## 2021-04-06 DIAGNOSIS — R7303 Prediabetes: Secondary | ICD-10-CM

## 2021-04-06 DIAGNOSIS — Z96612 Presence of left artificial shoulder joint: Secondary | ICD-10-CM

## 2021-04-09 NOTE — Telephone Encounter (Signed)
LOV w/ Dr. O

## 2021-04-10 ENCOUNTER — Ambulatory Visit: Payer: Medicare HMO | Admitting: Neurology

## 2021-04-10 DIAGNOSIS — G244 Idiopathic orofacial dystonia: Secondary | ICD-10-CM | POA: Diagnosis not present

## 2021-04-10 DIAGNOSIS — G43709 Chronic migraine without aura, not intractable, without status migrainosus: Secondary | ICD-10-CM

## 2021-04-10 NOTE — Progress Notes (Signed)
Diagnosis: oromandibular dystonia  History: This is a 71 year old female who is here for oral mandibular dystonia causing significant disruption to her life.  Symptoms started over a year ago with pain in the jaw muscles, limited mouth opening to where she had significantly decreased intake and weight loss, also dysarthria and temporomandibular joint problems due to the symptoms.  They were impairing her daily activities with social embarrassment, inability to work, weight loss and forming a significant impact on the overall quality of life of the patient.  Inability to open her jaw limits the maximal mouth opening and hampers speech, mastication/eating.  Medications tried: Patient's been under the care of neurology, primary care, she has tried physical therapy, massage, chiropractic, analgesics, muscle relaxer such as Flexeril and baclofen which gave her significant side effects such as sedation.  These were tried over a 33-month period without reduction of pain or quality of life or range of motion.  Botox was used, 100 units in 2 muscles in 5 locations in each muscle. 50 units injected into the left masseter, 50 units in the right masseter a 100 units of botox was used and 0 units was discarded.  Patient tolerated the procedure.  There was no significant bleeding or pain.

## 2021-04-10 NOTE — Progress Notes (Signed)
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

## 2021-04-10 NOTE — Progress Notes (Signed)
Consent Form ?Botulism Toxin Injection For Chronic Migraine ? ?04/10/2021: Doing great, >>70% decrease migraines.  ? ?Reviewed orally with patient, additionally signature is on file: ? ?Botulism toxin has been approved by the Federal drug administration for treatment of chronic migraine. Botulism toxin does not cure chronic migraine and it may not be effective in some patients. ? ?The administration of botulism toxin is accomplished by injecting a small amount of toxin into the muscles of the neck and head. Dosage must be titrated for each individual. Any benefits resulting from botulism toxin tend to wear off after 3 months with a repeat injection required if benefit is to be maintained. Injections are usually done every 3-4 months with maximum effect peak achieved by about 2 or 3 weeks. Botulism toxin is expensive and you should be sure of what costs you will incur resulting from the injection. ? ?The side effects of botulism toxin use for chronic migraine may include: ? ? -Transient, and usually mild, facial weakness with facial injections ? -Transient, and usually mild, head or neck weakness with head/neck injections ? -Reduction or loss of forehead facial animation due to forehead muscle weakness ? -Eyelid drooping ? -Dry eye ? -Pain at the site of injection or bruising at the site of injection ? -Double vision ? -Potential unknown long term risks ? ?Contraindications: You should not have Botox if you are pregnant, nursing, allergic to albumin, have an infection, skin condition, or muscle weakness at the site of the injection, or have myasthenia gravis, Lambert-Eaton syndrome, or ALS. ? ?It is also possible that as with any injection, there may be an allergic reaction or no effect from the medication. Reduced effectiveness after repeated injections is sometimes seen and rarely infection at the injection site may occur. All care will be taken to prevent these side effects. If therapy is given over a long time,  atrophy and wasting in the muscle injected may occur. Occasionally the patient's become refractory to treatment because they develop antibodies to the toxin. In this event, therapy needs to be modified. ? ?I have read the above information and consent to the administration of botulism toxin. ? ? ? ?BOTOX PROCEDURE NOTE FOR MIGRAINE HEADACHE ? ? ? ?Contraindications and precautions discussed with patient(above). Aseptic procedure was observed and patient tolerated procedure. Procedure performed by Dr. Toni Maren Wiesen ? ?The condition has existed for more than 6 months, and pt does not have a diagnosis of ALS, Myasthenia Gravis or Lambert-Eaton Syndrome.  Risks and benefits of injections discussed and pt agrees to proceed with the procedure.  Written consent obtained ? ?These injections are medically necessary. Pt  receives good benefits from these injections. These injections do not cause sedations or hallucinations which the oral therapies may cause. ? ?Description of procedure: ? ?The patient was placed in a sitting position. The standard protocol was used for Botox as follows, with 5 units of Botox injected at each site: ? ? ?-Procerus muscle, midline injection ? ?-Corrugator muscle, bilateral injection ? ?-Frontalis muscle, bilateral injection, with 2 sites each side, medial injection was performed in the upper one third of the frontalis muscle, in the region vertical from the medial inferior edge of the superior orbital rim. The lateral injection was again in the upper one third of the forehead vertically above the lateral limbus of the cornea, 1.5 cm lateral to the medial injection site. ? ?-Temporalis muscle injection, 4 sites, bilaterally. The first injection was 3 cm above the tragus of the ear, second injection   site was 1.5 cm to 3 cm up from the first injection site in line with the tragus of the ear. The third injection site was 1.5-3 cm forward between the first 2 injection sites. The fourth injection site  was 1.5 cm posterior to the second injection site.  ? ?-Occipitalis muscle injection, 3 sites, bilaterally. The first injection was done one half way between the occipital protuberance and the tip of the mastoid process behind the ear. The second injection site was done lateral and superior to the first, 1 fingerbreadth from the first injection. The third injection site was 1 fingerbreadth superiorly and medially from the first injection site. ? ?-Cervical paraspinal muscle injection, 2 sites, bilateral knee first injection site was 1 cm from the midline of the cervical spine, 3 cm inferior to the lower border of the occipital protuberance. The second injection site was 1.5 cm superiorly and laterally to the first injection site. ? ?-Trapezius muscle injection was performed at 3 sites, bilaterally. The first injection site was in the upper trapezius muscle halfway between the inflection point of the neck, and the acromion. The second injection site was one half way between the acromion and the first injection site. The third injection was done between the first injection site and the inflection point of the neck. ? ? ?Will return for repeat injection in 3 months. ? ? ?155 units of Botox was used, 45u Botox not injected was wasted. The patient tolerated the procedure well, there were no complications of the above procedure. ? ? ?

## 2021-04-11 DIAGNOSIS — J019 Acute sinusitis, unspecified: Secondary | ICD-10-CM | POA: Diagnosis not present

## 2021-04-12 ENCOUNTER — Other Ambulatory Visit: Payer: Self-pay

## 2021-04-12 ENCOUNTER — Ambulatory Visit (INDEPENDENT_AMBULATORY_CARE_PROVIDER_SITE_OTHER): Payer: Medicare HMO | Admitting: Family Medicine

## 2021-04-12 ENCOUNTER — Encounter (INDEPENDENT_AMBULATORY_CARE_PROVIDER_SITE_OTHER): Payer: Self-pay | Admitting: Family Medicine

## 2021-04-12 VITALS — BP 96/66 | HR 94 | Temp 98.0°F | Ht 61.0 in | Wt 180.0 lb

## 2021-04-12 DIAGNOSIS — R7303 Prediabetes: Secondary | ICD-10-CM | POA: Diagnosis not present

## 2021-04-12 DIAGNOSIS — F3289 Other specified depressive episodes: Secondary | ICD-10-CM | POA: Diagnosis not present

## 2021-04-12 DIAGNOSIS — E559 Vitamin D deficiency, unspecified: Secondary | ICD-10-CM | POA: Diagnosis not present

## 2021-04-12 DIAGNOSIS — Z6834 Body mass index (BMI) 34.0-34.9, adult: Secondary | ICD-10-CM

## 2021-04-12 DIAGNOSIS — R69 Illness, unspecified: Secondary | ICD-10-CM | POA: Diagnosis not present

## 2021-04-12 MED ORDER — VITAMIN D (ERGOCALCIFEROL) 1.25 MG (50000 UNIT) PO CAPS: ORAL_CAPSULE | ORAL | 0 refills | Status: DC

## 2021-04-12 MED ORDER — TOPIRAMATE 25 MG PO TABS: 25.0000 mg | ORAL_TABLET | Freq: Every day | ORAL | 0 refills | Status: DC

## 2021-04-12 MED ORDER — METFORMIN HCL 500 MG PO TABS: ORAL_TABLET | ORAL | 0 refills | Status: DC

## 2021-04-12 MED ORDER — BUPROPION HCL ER (SR) 200 MG PO TB12: 200.0000 mg | ORAL_TABLET | Freq: Every day | ORAL | 0 refills | Status: DC

## 2021-04-16 NOTE — Progress Notes (Signed)
Chief Complaint:   OBESITY Melissa Leach is here to discuss her progress with her obesity treatment plan along with follow-up of her obesity related diagnoses. Melissa Leach is on the Category 2 Plan and states she is following her eating plan approximately 50% of the time. Melissa Leach states she is walking for 40 minutes 7 times per week.  Today's visit was #: 28 Starting weight: 216 lbs Starting date: 04/08/2019 Today's weight: 180 lbs Today's date: 04/12/2021 Total lbs lost to date: 36 Total lbs lost since last in-office visit: 0  Interim History: Melissa Leach has done well with maintaining her weight. She went to [place] on vacation and she has had a sinus infection since coming back. She is working on getting back to meal planning.  Subjective:   1. Vitamin D deficiency Melissa Leach is stable on Vit D, and she requests a refill.  2. Pre-diabetes Melissa Leach is stable on metformin and she is working on diet and exercise.  3. Other depression with emotional eating Melissa Leach has done well with minimizing emotional eating behaviors over the holidays.  Assessment/Plan:   1. Vitamin D deficiency We will refill prescription Vitamin D 50,000 IU every week for 1 month. Melissa Leach will follow-up for routine testing of Vitamin D, at least 2-3 times per year to avoid over-replacement.  - Vitamin D, Ergocalciferol, (DRISDOL) 1.25 MG (50000 UNIT) CAPS capsule; TAKE 1 CAPSULE BY MOUTH ONE TIME PER WEEK  Dispense: 4 capsule; Refill: 0  2. Pre-diabetes We will refill metformin for 1 month. Melissa Leach will continue to work on weight loss, exercise, and decreasing simple carbohydrates to help decrease the risk of diabetes.   - metFORMIN (GLUCOPHAGE) 500 MG tablet; Take one tablet daily with breakfast.  Dispense: 30 tablet; Refill: 0  3. Other depression with emotional eating We will refill Topamax, and Wellbutrin SR for 1 month. Behavior modification techniques were discussed today to help Melissa Leach deal with her  emotional/non-hunger eating behaviors. Orders and follow up as documented in patient record.   - buPROPion (WELLBUTRIN SR) 200 MG 12 hr tablet; Take 1 tablet (200 mg total) by mouth daily.  Dispense: 30 tablet; Refill: 0 - topiramate (TOPAMAX) 25 MG tablet; Take 1 tablet (25 mg total) by mouth daily.  Dispense: 30 tablet; Refill: 0  4. Obesity with current BMI of 34.1 Melissa Leach is currently in the action stage of change. As such, her goal is to continue with weight loss efforts. She has agreed to the Category 2 Plan or keeping a food journal and adhering to recommended goals of 1200-1400 calories and 80+ grams of protein daily.   Exercise goals: As is.  Behavioral modification strategies: increasing water intake and meal planning and cooking strategies.  Melissa Leach has agreed to follow-up with our clinic in 4 weeks. She was informed of the importance of frequent follow-up visits to maximize her success with intensive lifestyle modifications for her multiple health conditions.   Objective:   Blood pressure 96/66, pulse 94, temperature 98 F (36.7 C), height 5\' 1"  (1.549 m), weight 180 lb (81.6 kg), last menstrual period 12/27/2002, SpO2 96 %. Body mass index is 34.01 kg/m.  General: Cooperative, alert, well developed, in no acute distress. HEENT: Conjunctivae and lids unremarkable. Cardiovascular: Regular rhythm.  Lungs: Normal work of breathing. Neurologic: No focal deficits.   Lab Results  Component Value Date   CREATININE 1.04 (H) 01/11/2021   BUN 32 (H) 01/11/2021   NA 137 01/11/2021   K 4.5 01/11/2021   CL 97 01/11/2021  CO2 22 01/11/2021   Lab Results  Component Value Date   ALT 17 01/11/2021   AST 27 01/11/2021   ALKPHOS 125 (H) 01/11/2021   BILITOT 0.4 01/11/2021   Lab Results  Component Value Date   HGBA1C 5.4 01/11/2021   HGBA1C 5.3 05/29/2020   HGBA1C 5.4 03/14/2020   HGBA1C 5.5 11/01/2019   HGBA1C 5.5 07/22/2019   Lab Results  Component Value Date   INSULIN  9.1 01/11/2021   INSULIN 9.2 05/29/2020   INSULIN 8.3 11/01/2019   INSULIN 9.8 07/22/2019   INSULIN 12.1 04/08/2019   Lab Results  Component Value Date   TSH 2.240 01/11/2021   Lab Results  Component Value Date   CHOL 132 01/11/2021   HDL 54 01/11/2021   LDLCALC 61 01/11/2021   TRIG 91 01/11/2021   Lab Results  Component Value Date   VD25OH 65.3 01/11/2021   VD25OH 55.3 05/29/2020   VD25OH 60.7 11/01/2019   Lab Results  Component Value Date   WBC 6.2 04/04/2020   HGB 12.9 04/04/2020   HCT 40.9 04/04/2020   MCV 98.8 04/04/2020   PLT 268 04/04/2020   No results found for: IRON, TIBC, FERRITIN  Obesity Behavioral Intervention:   Approximately 15 minutes were spent on the discussion below.  ASK: We discussed the diagnosis of obesity with [name] today and Melissa Leach agreed to give [place] permission to discuss obesity behavioral modification therapy today.  ASSESS: Melissa Leach has the diagnosis of obesity and her BMI today is 34.1. Melissa Leach is in the action stage of change.   ADVISE: Melissa Leach was educated on the multiple health risks of obesity as well as the benefit of weight loss to improve her health. She was advised of the need for long term treatment and the importance of lifestyle modifications to improve her current health and to decrease her risk of future health problems.  AGREE: Multiple dietary modification options and treatment options were discussed and Amantha agreed to follow the recommendations documented in the above note.  ARRANGE: Melissa Leach was educated on the importance of frequent visits to treat obesity as outlined per CMS and USPSTF guidelines and agreed to schedule her next follow up appointment today.  Attestation Statements:   Reviewed by clinician on day of visit: allergies, medications, problem list, medical history, surgical history, family history, social history, and previous encounter notes.   I, Trixie Dredge, am acting as transcriptionist for  Dennard Nip, MD.  I have reviewed the above documentation for accuracy and completeness, and I agree with the above. -  Dennard Nip, MD

## 2021-04-24 DIAGNOSIS — H43813 Vitreous degeneration, bilateral: Secondary | ICD-10-CM | POA: Diagnosis not present

## 2021-05-04 ENCOUNTER — Other Ambulatory Visit (INDEPENDENT_AMBULATORY_CARE_PROVIDER_SITE_OTHER): Payer: Self-pay | Admitting: Family Medicine

## 2021-05-04 DIAGNOSIS — R7303 Prediabetes: Secondary | ICD-10-CM

## 2021-05-05 ENCOUNTER — Other Ambulatory Visit (INDEPENDENT_AMBULATORY_CARE_PROVIDER_SITE_OTHER): Payer: Self-pay | Admitting: Family Medicine

## 2021-05-05 DIAGNOSIS — F3289 Other specified depressive episodes: Secondary | ICD-10-CM

## 2021-05-07 ENCOUNTER — Other Ambulatory Visit (HOSPITAL_COMMUNITY): Payer: Self-pay | Admitting: Orthopedic Surgery

## 2021-05-07 ENCOUNTER — Other Ambulatory Visit: Payer: Self-pay | Admitting: Orthopedic Surgery

## 2021-05-07 DIAGNOSIS — Z96651 Presence of right artificial knee joint: Secondary | ICD-10-CM

## 2021-05-07 NOTE — Telephone Encounter (Signed)
LAST APPOINTMENT DATE: 04/12/21 NEXT APPOINTMENT DATE: 05/17/21   [id number] - [place], [place] - [address] AT [address]NER OF [facility] [address] [place] [address] Phone: [phone] Fax: [phone]  Patient is requesting a refill of the following medications: Requested Prescriptions   Pending Prescriptions Disp Refills   metFORMIN (GLUCOPHAGE) 500 MG tablet [Pharmacy Med Name: METFORMIN HCL 500 MG TABLET] 30 tablet 0    Sig: TAKE ONE TABLET DAILY WITH BREAKFAST.    Date last filled: 04/12/21 Previously prescribed by Dr. Dalbert Garnet  Lab Results  Component Value Date   HGBA1C 5.4 01/11/2021   HGBA1C 5.3 05/29/2020   HGBA1C 5.4 03/14/2020   Lab Results  Component Value Date   LDLCALC 61 01/11/2021   CREATININE 1.04 (H) 01/11/2021   Lab Results  Component Value Date   VD25OH 65.3 01/11/2021   VD25OH 55.3 05/29/2020   VD25OH 60.7 11/01/2019    BP Readings from Last 3 Encounters:  04/12/21 96/66  02/08/21 102/68  01/11/21 109/71

## 2021-05-07 NOTE — Telephone Encounter (Signed)
LAST APPOINTMENT DATE: 04/12/21 NEXT APPOINTMENT DATE: 05/17/21   [id number] - [place], [place] - [address] AT [address]NER OF [facility] [address] [place] [address] Phone: [phone] Fax: [phone]  Patient is requesting a refill of the following medications: Requested Prescriptions   Pending Prescriptions Disp Refills   topiramate (TOPAMAX) 25 MG tablet [Pharmacy Med Name: TOPIRAMATE 25 MG TABLET] 30 tablet 0    Sig: TAKE 1 TABLET (25 MG TOTAL) BY MOUTH DAILY.    Date last filled: 04/12/21 Previously prescribed by Dr. Dalbert Garnet  Lab Results  Component Value Date   HGBA1C 5.4 01/11/2021   HGBA1C 5.3 05/29/2020   HGBA1C 5.4 03/14/2020   Lab Results  Component Value Date   LDLCALC 61 01/11/2021   CREATININE 1.04 (H) 01/11/2021   Lab Results  Component Value Date   VD25OH 65.3 01/11/2021   VD25OH 55.3 05/29/2020   VD25OH 60.7 11/01/2019    BP Readings from Last 3 Encounters:  04/12/21 96/66  02/08/21 102/68  01/11/21 109/71

## 2021-05-08 DIAGNOSIS — R071 Chest pain on breathing: Secondary | ICD-10-CM | POA: Diagnosis not present

## 2021-05-09 DIAGNOSIS — M25561 Pain in right knee: Secondary | ICD-10-CM | POA: Diagnosis not present

## 2021-05-09 DIAGNOSIS — Z96651 Presence of right artificial knee joint: Secondary | ICD-10-CM | POA: Diagnosis not present

## 2021-05-09 NOTE — Progress Notes (Signed)
° °  Melissa Leach June 28, 1950 [id number]   History: Postmenopausal 71 y.o. presents for annual exam. C/o irritated/itchy area inner labia x 1 year. Does have a history of lichen sclerosus, has not tried the steroid cream.    Gynecologic History Patient's last menstrual period was 12/27/2002. Contraception: abstinence and post menopausal status Last Pap: 2021. Results were: normal Last mammogram: 05/2020. Results were: normal  Obstetric History OB History  Gravida Para Term Preterm AB Living  2 2 2     2   SAB IAB Ectopic Multiple Live Births          2    # Outcome Date GA Lbr Len/2nd Weight Sex Delivery Anes PTL Lv  2 Term         LIV  1 Term         LIV     The following portions of the patient's history were reviewed and updated as appropriate: allergies, current medications, past family history, past medical history, past social history, past surgical history, and problem list.  Review of Systems Pertinent items are noted in HPI.  Past medical history, past surgical history, family history and social history were all reviewed and documented in the EPIC chart.  ROS:  A ROS was performed and pertinent positives and negatives are included. Review of Systems - Negative except gyn- vular irritation  Exam:  Vitals:   05/10/21 0858  BP: 122/80  Pulse: 89  SpO2: 97%  Weight: 186 lb (84.4 kg)  Height: 5' 0.5" (1.537 m)   Body mass index is 35.73 kg/m.  General appearance:  Normal Thyroid:  Symmetrical, normal in size, without palpable masses or nodularity. Respiratory  Auscultation:  Clear without wheezing or rhonchi Cardiovascular  Auscultation:  Regular rate, without rubs, murmurs or gallops  Edema/varicosities:  Not grossly evident Abdominal  Soft,nontender, without masses, guarding or rebound.  Liver/spleen:  No organomegaly noted  Hernia:  None appreciated  Skin  Inspection:  Grossly normal Breasts: Examined lying and sitting.   Right: Without masses,  retractions, nipple discharge or axillary adenopathy.   Left: Without masses, retractions, nipple discharge or axillary adenopathy. Genitourinary   Inguinal/mons:  Normal without inguinal adenopathy  External genitalia:  Lichen sclerosus plaques noted on the inner labia minora bilaterally  Vagina:  Normal appearing with normal color and discharge, no lesions. Atrophy: mild Prolapse: none  Cervix:  Normal appearing without discharge or lesions  Uterus:  Normal in size, shape and contour.  Midline and mobile, nontender  Adnexa/parametria:     Rt: Normal in size, without masses or tenderness.   Lt: Normal in size, without masses or tenderness.  Anus and perineum: Normal  Digital rectal exam: Normal sphincter tone without palpated masses or tenderness  Patient informed chaperone available to be present for breast and pelvic exam. Patient has requested no chaperone to be present. Patient has been advised what will be completed during breast and pelvic exam.   Assessment/Plan:   - Well woman exam - Lichen sclerosus, rx for clobetasol ointment sent to pharmacy - Vulvovaginal atrophy- moisturize with vit E oil daily   Discussed SBE, colonoscopy and DEXA screening as directed. Recommend 12mins of exercise weekly, including weight bearing exercise. Encouraged the use of seatbelts and sunscreen.  Return in 1 year for annual or sooner prn.  Kerry Dory WHNP-BC, 9:40 AM 05/10/2021

## 2021-05-10 ENCOUNTER — Encounter: Payer: Self-pay | Admitting: Radiology

## 2021-05-10 ENCOUNTER — Other Ambulatory Visit: Payer: Self-pay

## 2021-05-10 ENCOUNTER — Ambulatory Visit (INDEPENDENT_AMBULATORY_CARE_PROVIDER_SITE_OTHER): Payer: Medicare HMO | Admitting: Radiology

## 2021-05-10 VITALS — BP 122/80 | HR 89 | Ht 60.5 in | Wt 186.0 lb

## 2021-05-10 DIAGNOSIS — L900 Lichen sclerosus et atrophicus: Secondary | ICD-10-CM

## 2021-05-10 DIAGNOSIS — G5601 Carpal tunnel syndrome, right upper limb: Secondary | ICD-10-CM | POA: Diagnosis not present

## 2021-05-10 DIAGNOSIS — Z01419 Encounter for gynecological examination (general) (routine) without abnormal findings: Secondary | ICD-10-CM | POA: Diagnosis not present

## 2021-05-10 MED ORDER — CLOBETASOL PROPIONATE 0.05 % EX OINT: 1.0000 "application " | TOPICAL_OINTMENT | Freq: Two times a day (BID) | CUTANEOUS | 3 refills | Status: AC

## 2021-05-11 ENCOUNTER — Encounter (HOSPITAL_COMMUNITY): Payer: Self-pay

## 2021-05-11 ENCOUNTER — Encounter (HOSPITAL_COMMUNITY): Payer: Medicare HMO

## 2021-05-16 ENCOUNTER — Other Ambulatory Visit (HOSPITAL_COMMUNITY): Payer: Self-pay | Admitting: Orthopedic Surgery

## 2021-05-16 DIAGNOSIS — R52 Pain, unspecified: Secondary | ICD-10-CM

## 2021-05-16 DIAGNOSIS — M79604 Pain in right leg: Secondary | ICD-10-CM | POA: Diagnosis not present

## 2021-05-16 DIAGNOSIS — T8484XA Pain due to internal orthopedic prosthetic devices, implants and grafts, initial encounter: Secondary | ICD-10-CM | POA: Diagnosis not present

## 2021-05-16 DIAGNOSIS — M25561 Pain in right knee: Secondary | ICD-10-CM | POA: Diagnosis not present

## 2021-05-17 ENCOUNTER — Ambulatory Visit (INDEPENDENT_AMBULATORY_CARE_PROVIDER_SITE_OTHER): Payer: Medicare HMO | Admitting: Family Medicine

## 2021-05-17 ENCOUNTER — Ambulatory Visit (HOSPITAL_COMMUNITY)
Admission: RE | Admit: 2021-05-17 | Discharge: 2021-05-17 | Disposition: A | Discharge status: Home / Self Care | Payer: Medicare HMO | Source: Ambulatory Visit | Attending: Orthopedic Surgery | Admitting: Orthopedic Surgery

## 2021-05-17 ENCOUNTER — Other Ambulatory Visit: Payer: Self-pay

## 2021-05-17 DIAGNOSIS — R52 Pain, unspecified: Secondary | ICD-10-CM

## 2021-05-24 DIAGNOSIS — M79641 Pain in right hand: Secondary | ICD-10-CM | POA: Diagnosis not present

## 2021-05-25 NOTE — Progress Notes (Signed)
Surgery orders requested via Epic inbox. °

## 2021-05-28 NOTE — Progress Notes (Addendum)
Your procedure is scheduled on:    05/31/21   Report to [facility] Main  Entrance   Report to admitting at       0915am             DO NOT BRING INSURANCE CARD, PICTURE ID OR WALLET DAY OF SURGERY.      Call this number if you have problems the morning of surgery [phone]    REMEMBER: NO  SOLID FOODS , CANDY, GUM OR MINTS AFTER MIDNITE THE NITE BEFORE SURGERY .       Marland Kitchen CLEAR LIQUIDS UNTIL     0900am            DAY OF SURGERY.      PLEASE FINISH g2 lower sugar  DRINK PER SURGEON ORDER  WHICH NEEDS TO BE COMPLETED AT   0900am        MORNING OF SURGERY.       CLEAR LIQUID DIET   Foods Allowed      WATER BLACK COFFEE ( SUGAR OK, NO MILK, CREAM OR CREAMER) REGULAR AND DECAF  TEA ( SUGAR OK NO MILK, CREAM, OR CREAMER) REGULAR AND DECAF  PLAIN JELLO ( NO RED)  FRUIT ICES ( NO RED, NO FRUIT PULP)  POPSICLES ( NO RED)  JUICE- APPLE, WHITE GRAPE AND WHITE CRANBERRY  SPORT DRINK LIKE GATORADE ( NO RED)  CLEAR BROTH ( VEGETABLE , CHICKEN OR BEEF)                                                                     BRUSH YOUR TEETH MORNING OF SURGERY AND RINSE YOUR MOUTH OUT, NO CHEWING GUM CANDY OR MINTS.     Take these medicines the morning of surgery with A SIP OF WATER:  Wellbutrin, zyrtec, synthroid, eye drops as usual, protonix, zoloft,    DO NOT TAKE ANY DIABETIC MEDICATIONS DAY OF YOUR SURGERY                               You may not have any metal on your body including hair pins and              piercings  Do not wear jewelry, make-up, lotions, powders or perfumes, deodorant             Do not wear nail polish on your fingernails.              IF YOU ARE A FEMALE AND WANT TO SHAVE UNDER ARMS OR LEGS PRIOR TO SURGERY YOU MUST DO SO AT LEAST 48 HOURS PRIOR TO SURGERY.              Men may shave face and neck.   Do not bring valuables to the hospital. [facility] IS NOT             RESPONSIBLE   FOR VALUABLES.  Contacts, dentures or bridgework may not be  worn into surgery.  Leave suitcase in the car. After surgery it may be brought to your room.     Patients discharged the day of surgery will not be allowed to drive home. IF YOU ARE HAVING SURGERY AND  GOING HOME THE SAME DAY, YOU MUST HAVE AN ADULT TO DRIVE YOU HOME AND BE WITH YOU FOR 24 HOURS. YOU MAY GO HOME BY TAXI OR UBER OR ORTHERWISE, BUT AN ADULT MUST ACCOMPANY YOU HOME AND STAY WITH YOU FOR 24 HOURS.                Please read over the following fact sheets you were given: _____________________________________________________________________  [facility] - Preparing for Surgery Before surgery, you can play an important role.  Because skin is not sterile, your skin needs to be as free of germs as possible.  You can reduce the number of germs on your skin by washing with CHG (chlorahexidine gluconate) soap before surgery.  CHG is an antiseptic cleaner which kills germs and bonds with the skin to continue killing germs even after washing. Please DO NOT use if you have an allergy to CHG or antibacterial soaps.  If your skin becomes reddened/irritated stop using the CHG and inform your nurse when you arrive at Short Stay. Do not shave (including legs and underarms) for at least 48 hours prior to the first CHG shower.  You may shave your face/neck. Please follow these instructions carefully:  1.  Shower with CHG Soap the night before surgery and the  morning of Surgery.  2.  If you choose to wash your hair, wash your hair first as usual with your  normal  shampoo.  3.  After you shampoo, rinse your hair and body thoroughly to remove the  shampoo.                           4.  Use CHG as you would any other liquid soap.  You can apply chg directly  to the skin and wash                       Gently with a scrungie or clean washcloth.  5.  Apply the CHG Soap to your body ONLY FROM THE NECK DOWN.   Do not use on face/ open                           Wound or open sores. Avoid contact with eyes,  ears mouth and genitals (private parts).                       Wash face,  Genitals (private parts) with your normal soap.             6.  Wash thoroughly, paying special attention to the area where your surgery  will be performed.  7.  Thoroughly rinse your body with warm water from the neck down.  8.  DO NOT shower/wash with your normal soap after using and rinsing off  the CHG Soap.                9.  Pat yourself dry with a clean towel.            10.  Wear clean pajamas.            11.  Place clean sheets on your bed the night of your first shower and do not  sleep with pets. Day of Surgery : Do not apply any lotions/deodorants the morning of surgery.  Please wear clean clothes to the hospital/surgery center.  FAILURE TO FOLLOW THESE INSTRUCTIONS MAY  RESULT IN THE CANCELLATION OF YOUR SURGERY PATIENT SIGNATURE_________________________________  NURSE SIGNATURE__________________________________  ________________________________________________________________________

## 2021-05-29 ENCOUNTER — Encounter (HOSPITAL_COMMUNITY)
Admission: RE | Admit: 2021-05-29 | Discharge: 2021-05-29 | Disposition: A | Discharge status: Home / Self Care | Payer: Medicare HMO | Source: Ambulatory Visit | Attending: Orthopedic Surgery | Admitting: Orthopedic Surgery

## 2021-05-29 ENCOUNTER — Encounter (HOSPITAL_COMMUNITY): Payer: Self-pay

## 2021-05-29 ENCOUNTER — Other Ambulatory Visit: Payer: Self-pay

## 2021-05-29 VITALS — BP 107/61 | HR 72 | Temp 98.7°F | Resp 16 | Ht 61.0 in | Wt 186.0 lb

## 2021-05-29 DIAGNOSIS — K219 Gastro-esophageal reflux disease without esophagitis: Secondary | ICD-10-CM | POA: Diagnosis not present

## 2021-05-29 DIAGNOSIS — Z79899 Other long term (current) drug therapy: Secondary | ICD-10-CM | POA: Diagnosis not present

## 2021-05-29 DIAGNOSIS — E038 Other specified hypothyroidism: Secondary | ICD-10-CM | POA: Diagnosis not present

## 2021-05-29 DIAGNOSIS — Z20822 Contact with and (suspected) exposure to covid-19: Secondary | ICD-10-CM | POA: Insufficient documentation

## 2021-05-29 DIAGNOSIS — Z01818 Encounter for other preprocedural examination: Secondary | ICD-10-CM | POA: Insufficient documentation

## 2021-05-29 DIAGNOSIS — Z96641 Presence of right artificial hip joint: Secondary | ICD-10-CM | POA: Diagnosis not present

## 2021-05-29 DIAGNOSIS — Z96611 Presence of right artificial shoulder joint: Secondary | ICD-10-CM | POA: Diagnosis not present

## 2021-05-29 DIAGNOSIS — Z96651 Presence of right artificial knee joint: Secondary | ICD-10-CM

## 2021-05-29 DIAGNOSIS — T84012A Broken internal right knee prosthesis, initial encounter: Secondary | ICD-10-CM | POA: Diagnosis not present

## 2021-05-29 DIAGNOSIS — E7800 Pure hypercholesterolemia, unspecified: Secondary | ICD-10-CM | POA: Diagnosis not present

## 2021-05-29 DIAGNOSIS — G4700 Insomnia, unspecified: Secondary | ICD-10-CM | POA: Diagnosis not present

## 2021-05-29 DIAGNOSIS — Z96652 Presence of left artificial knee joint: Secondary | ICD-10-CM | POA: Diagnosis not present

## 2021-05-29 HISTORY — DX: Prediabetes: R73.03

## 2021-05-29 LAB — COMPREHENSIVE METABOLIC PANEL
ALT: 21 U/L (ref 0–44)
AST: 26 U/L (ref 15–41)
Albumin: 4.1 g/dL (ref 3.5–5.0)
Alkaline Phosphatase: 83 U/L (ref 38–126)
Anion gap: 7 (ref 5–15)
BUN: 23 mg/dL (ref 8–23)
CO2: 27 mmol/L (ref 22–32)
Calcium: 9.2 mg/dL (ref 8.9–10.3)
Chloride: 100 mmol/L (ref 98–111)
Creatinine, Ser: 1.3 mg/dL — ABNORMAL HIGH (ref 0.44–1.00)
GFR, Estimated: 45 mL/min — ABNORMAL LOW (ref >60)
Glucose, Bld: 95 mg/dL (ref 70–99)
Potassium: 4.2 mmol/L (ref 3.5–5.1)
Sodium: 134 mmol/L — ABNORMAL LOW (ref 135–145)
Total Bilirubin: 0.6 mg/dL (ref 0.3–1.2)
Total Protein: 7.1 g/dL (ref 6.5–8.1)

## 2021-05-29 LAB — HEMOGLOBIN A1C
Hgb A1c MFr Bld: 5.2 % (ref 4.8–5.6)
Mean Plasma Glucose: 102.54 mg/dL

## 2021-05-29 LAB — SURGICAL PCR SCREEN
MRSA, PCR: NEGATIVE
Staphylococcus aureus: NEGATIVE

## 2021-05-29 LAB — CBC
HCT: 39 % (ref 36.0–46.0)
Hemoglobin: 12.4 g/dL (ref 12.0–15.0)
MCH: 31.1 pg (ref 26.0–34.0)
MCHC: 31.8 g/dL (ref 30.0–36.0)
MCV: 97.7 fL (ref 80.0–100.0)
Platelets: 261 10*3/uL (ref 150–400)
RBC: 3.99 MIL/uL (ref 3.87–5.11)
RDW: 14.4 % (ref 11.5–15.5)
WBC: 6.6 10*3/uL (ref 4.0–10.5)
nRBC: 0 % (ref 0.0–0.2)

## 2021-05-29 LAB — GLUCOSE, CAPILLARY: Glucose-Capillary: 97 mg/dL (ref 70–99)

## 2021-05-29 LAB — SARS CORONAVIRUS 2 (TAT 6-24 HRS): SARS Coronavirus 2: NEGATIVE

## 2021-05-29 NOTE — Progress Notes (Addendum)
Anesthesia Review:  PCP: DR Jonathon Jordan  Cardiologist : none  Chest x-ray : EKG :05/29/21  Echo : Stress test: Cardiac Cath :  Activity level: can do a flight of stairs wihout difficulty  Sleep Study/ CPAP : none  Fasting Blood Sugar :      / Checks Blood Sugar -- times a day:   Blood Thinner/ Instructions /Last Dose: ASA / Instructions/ Last Dose :   Covid test on 05/29/21  Prediabetes- does not check glucose at home  Hgba1c- 05/29/21 -5.2

## 2021-05-30 NOTE — Anesthesia Preprocedure Evaluation (Addendum)
Anesthesia Evaluation  ?Patient identified by MRN, date of birth, ID band ?Patient awake ? ? ? ?Reviewed: ?Allergy & Precautions, NPO status , Patient's Chart, lab work & pertinent test results ? ?History of Anesthesia Complications ?Negative for: history of anesthetic complications ? ?Airway ?Mallampati: II ? ?TM Distance: >3 FB ?Neck ROM: Full ? ? ? Dental ?no notable dental hx. ? ?  ?Pulmonary ?former smoker,  ?  ?Pulmonary exam normal ? ? ? ? ? ? ? Cardiovascular ?negative cardio ROS ?Normal cardiovascular exam ? ? ?  ?Neuro/Psych ? Headaches, Anxiety Depression   ? GI/Hepatic ?Neg liver ROS, hiatal hernia, PUD, GERD  Medicated and Controlled,  ?Endo/Other  ?Hypothyroidism  ? Renal/GU ?negative Renal ROS  ?negative genitourinary ?  ?Musculoskeletal ? ?(+) Arthritis ,  ? Abdominal ?  ?Peds ? Hematology ?negative hematology ROS ?(+)   ?Anesthesia Other Findings ?Day of surgery medications reviewed with patient. ? Reproductive/Obstetrics ?negative OB ROS ? ?  ? ? ? ? ? ? ? ? ? ? ? ? ? ?  ?  ? ? ? ? ? ? ? ?Anesthesia Physical ?Anesthesia Plan ? ?ASA: 2 ? ?Anesthesia Plan: Spinal  ? ?Post-op Pain Management: Tylenol PO (pre-op) and Regional block*  ? ?Induction:  ? ?PONV Risk Score and Plan: 3 and Treatment may vary due to age or medical condition, Ondansetron, Propofol infusion, Dexamethasone and Midazolam ? ?Airway Management Planned: Natural Airway and Simple Face Mask ? ?Additional Equipment: None ? ?Intra-op Plan:  ? ?Post-operative Plan:  ? ?Informed Consent: I have reviewed the patients History and Physical, chart, labs and discussed the procedure including the risks, benefits and alternatives for the proposed anesthesia with the patient or authorized representative who has indicated his/her understanding and acceptance.  ? ? ? ? ? ?Plan Discussed with: CRNA ? ?Anesthesia Plan Comments:   ? ? ? ? ? ?Anesthesia Quick Evaluation ? ?

## 2021-05-31 ENCOUNTER — Other Ambulatory Visit (INDEPENDENT_AMBULATORY_CARE_PROVIDER_SITE_OTHER): Payer: Self-pay | Admitting: Family Medicine

## 2021-05-31 ENCOUNTER — Encounter (HOSPITAL_COMMUNITY): Payer: Self-pay | Admitting: Orthopedic Surgery

## 2021-05-31 ENCOUNTER — Other Ambulatory Visit: Payer: Self-pay

## 2021-05-31 ENCOUNTER — Encounter (INDEPENDENT_AMBULATORY_CARE_PROVIDER_SITE_OTHER): Payer: Self-pay

## 2021-05-31 ENCOUNTER — Encounter (HOSPITAL_COMMUNITY)
Admission: RE | Disposition: A | Discharge status: Home / Self Care | Payer: Self-pay | Source: Home / Self Care | Attending: Orthopedic Surgery

## 2021-05-31 ENCOUNTER — Inpatient Hospital Stay (HOSPITAL_COMMUNITY): Payer: Medicare HMO | Admitting: Physician Assistant

## 2021-05-31 ENCOUNTER — Inpatient Hospital Stay (HOSPITAL_COMMUNITY)
Admission: RE | Admit: 2021-05-31 | Discharge: 2021-06-02 | DRG: 468 | Disposition: A | Discharge status: Home / Self Care | Payer: Medicare HMO | Attending: Orthopedic Surgery | Admitting: Orthopedic Surgery

## 2021-05-31 ENCOUNTER — Inpatient Hospital Stay (HOSPITAL_COMMUNITY): Payer: Medicare HMO | Admitting: Anesthesiology

## 2021-05-31 DIAGNOSIS — E038 Other specified hypothyroidism: Secondary | ICD-10-CM | POA: Diagnosis not present

## 2021-05-31 DIAGNOSIS — Z79899 Other long term (current) drug therapy: Secondary | ICD-10-CM

## 2021-05-31 DIAGNOSIS — Z87891 Personal history of nicotine dependence: Secondary | ICD-10-CM | POA: Diagnosis not present

## 2021-05-31 DIAGNOSIS — Z20822 Contact with and (suspected) exposure to covid-19: Secondary | ICD-10-CM | POA: Diagnosis present

## 2021-05-31 DIAGNOSIS — Z83438 Family history of other disorder of lipoprotein metabolism and other lipidemia: Secondary | ICD-10-CM

## 2021-05-31 DIAGNOSIS — Z881 Allergy status to other antibiotic agents status: Secondary | ICD-10-CM

## 2021-05-31 DIAGNOSIS — Z96611 Presence of right artificial shoulder joint: Secondary | ICD-10-CM | POA: Diagnosis present

## 2021-05-31 DIAGNOSIS — K219 Gastro-esophageal reflux disease without esophagitis: Secondary | ICD-10-CM | POA: Diagnosis present

## 2021-05-31 DIAGNOSIS — Z882 Allergy status to sulfonamides status: Secondary | ICD-10-CM

## 2021-05-31 DIAGNOSIS — Z96651 Presence of right artificial knee joint: Secondary | ICD-10-CM | POA: Diagnosis not present

## 2021-05-31 DIAGNOSIS — G4700 Insomnia, unspecified: Secondary | ICD-10-CM | POA: Diagnosis not present

## 2021-05-31 DIAGNOSIS — Z79890 Hormone replacement therapy (postmenopausal): Secondary | ICD-10-CM | POA: Diagnosis not present

## 2021-05-31 DIAGNOSIS — Z7984 Long term (current) use of oral hypoglycemic drugs: Secondary | ICD-10-CM | POA: Diagnosis not present

## 2021-05-31 DIAGNOSIS — F3289 Other specified depressive episodes: Secondary | ICD-10-CM

## 2021-05-31 DIAGNOSIS — Z01818 Encounter for other preprocedural examination: Secondary | ICD-10-CM

## 2021-05-31 DIAGNOSIS — E7800 Pure hypercholesterolemia, unspecified: Secondary | ICD-10-CM | POA: Diagnosis present

## 2021-05-31 DIAGNOSIS — Z471 Aftercare following joint replacement surgery: Secondary | ICD-10-CM | POA: Diagnosis not present

## 2021-05-31 DIAGNOSIS — Z96652 Presence of left artificial knee joint: Secondary | ICD-10-CM | POA: Diagnosis present

## 2021-05-31 DIAGNOSIS — Y831 Surgical operation with implant of artificial internal device as the cause of abnormal reaction of the patient, or of later complication, without mention of misadventure at the time of the procedure: Secondary | ICD-10-CM | POA: Diagnosis present

## 2021-05-31 DIAGNOSIS — T84012A Broken internal right knee prosthesis, initial encounter: Secondary | ICD-10-CM | POA: Diagnosis not present

## 2021-05-31 DIAGNOSIS — Z96641 Presence of right artificial hip joint: Secondary | ICD-10-CM | POA: Diagnosis not present

## 2021-05-31 DIAGNOSIS — G8918 Other acute postprocedural pain: Secondary | ICD-10-CM | POA: Diagnosis not present

## 2021-05-31 DIAGNOSIS — T8484XA Pain due to internal orthopedic prosthetic devices, implants and grafts, initial encounter: Secondary | ICD-10-CM | POA: Diagnosis not present

## 2021-05-31 DIAGNOSIS — R7303 Prediabetes: Secondary | ICD-10-CM

## 2021-05-31 DIAGNOSIS — T84092A Other mechanical complication of internal right knee prosthesis, initial encounter: Secondary | ICD-10-CM | POA: Diagnosis not present

## 2021-05-31 HISTORY — PX: TOTAL KNEE REVISION: SHX996

## 2021-05-31 LAB — TYPE AND SCREEN
ABO/RH(D): O NEG
Antibody Screen: NEGATIVE

## 2021-05-31 LAB — GLUCOSE, CAPILLARY
Glucose-Capillary: 91 mg/dL (ref 70–99)
Glucose-Capillary: 122 mg/dL — ABNORMAL HIGH (ref 70–99)

## 2021-05-31 SURGERY — TOTAL KNEE REVISION
Anesthesia: Spinal | Site: Knee | Laterality: Right

## 2021-05-31 MED ORDER — ASPIRIN 81 MG PO CHEW
81.0000 mg | CHEWABLE_TABLET | Freq: Two times a day (BID) | ORAL | Status: DC
  Administered 2021-05-31 – 2021-06-02 (×4): 81 mg via ORAL
  Filled 2021-05-31 (×4): qty 1

## 2021-05-31 MED ORDER — PHENYLEPHRINE HCL-NACL 20-0.9 MG/250ML-% IV SOLN
INTRAVENOUS | Status: DC | PRN
  Administered 2021-05-31: 25 ug/min via INTRAVENOUS

## 2021-05-31 MED ORDER — ONDANSETRON HCL 4 MG PO TABS: 4.0000 mg | ORAL_TABLET | Freq: Four times a day (QID) | ORAL | Status: DC | PRN

## 2021-05-31 MED ORDER — BUPIVACAINE-EPINEPHRINE (PF) 0.25% -1:200000 IJ SOLN
INTRAMUSCULAR | Status: AC
  Filled 2021-05-31: qty 30

## 2021-05-31 MED ORDER — SUMATRIPTAN SUCCINATE 50 MG PO TABS
100.0000 mg | ORAL_TABLET | ORAL | Status: DC | PRN
  Filled 2021-05-31: qty 2

## 2021-05-31 MED ORDER — PROPOFOL 500 MG/50ML IV EMUL
INTRAVENOUS | Status: DC | PRN
  Administered 2021-05-31: 40 ug/kg/min via INTRAVENOUS

## 2021-05-31 MED ORDER — METOCLOPRAMIDE HCL 5 MG PO TABS: 5.0000 mg | ORAL_TABLET | Freq: Three times a day (TID) | ORAL | Status: DC | PRN

## 2021-05-31 MED ORDER — MELOXICAM 15 MG PO TABS
15.0000 mg | ORAL_TABLET | Freq: Every day | ORAL | Status: DC
  Administered 2021-05-31 – 2021-06-02 (×3): 15 mg via ORAL
  Filled 2021-05-31 (×3): qty 1

## 2021-05-31 MED ORDER — CEFAZOLIN SODIUM-DEXTROSE 2-4 GM/100ML-% IV SOLN
2.0000 g | INTRAVENOUS | Status: AC
  Administered 2021-05-31: 2 g via INTRAVENOUS
  Filled 2021-05-31: qty 100

## 2021-05-31 MED ORDER — ORAL CARE MOUTH RINSE: 15.0000 mL | Freq: Once | OROMUCOSAL | Status: AC

## 2021-05-31 MED ORDER — PROPOFOL 10 MG/ML IV BOLUS
INTRAVENOUS | Status: DC | PRN
  Administered 2021-05-31: 30 mg via INTRAVENOUS

## 2021-05-31 MED ORDER — DOCUSATE SODIUM 100 MG PO CAPS
100.0000 mg | ORAL_CAPSULE | Freq: Two times a day (BID) | ORAL | Status: DC
  Administered 2021-05-31 – 2021-06-02 (×4): 100 mg via ORAL
  Filled 2021-05-31 (×4): qty 1

## 2021-05-31 MED ORDER — CLONIDINE HCL (ANALGESIA) 100 MCG/ML EP SOLN
EPIDURAL | Status: DC | PRN
  Administered 2021-05-31: 100 ug

## 2021-05-31 MED ORDER — OXYCODONE HCL 5 MG/5ML PO SOLN: 5.0000 mg | Freq: Once | ORAL | Status: DC | PRN

## 2021-05-31 MED ORDER — CEFAZOLIN SODIUM-DEXTROSE 2-4 GM/100ML-% IV SOLN
2.0000 g | Freq: Four times a day (QID) | INTRAVENOUS | Status: AC
  Administered 2021-05-31 (×2): 2 g via INTRAVENOUS
  Filled 2021-05-31 (×2): qty 100

## 2021-05-31 MED ORDER — VANCOMYCIN HCL 1000 MG IV SOLR
INTRAVENOUS | Status: AC
  Filled 2021-05-31: qty 40

## 2021-05-31 MED ORDER — PHENYLEPHRINE HCL (PRESSORS) 10 MG/ML IV SOLN
INTRAVENOUS | Status: AC
  Filled 2021-05-31: qty 1

## 2021-05-31 MED ORDER — METHOCARBAMOL 500 MG PO TABS
500.0000 mg | ORAL_TABLET | Freq: Four times a day (QID) | ORAL | Status: DC | PRN
  Administered 2021-05-31: 500 mg via ORAL
  Filled 2021-05-31: qty 1

## 2021-05-31 MED ORDER — LACTATED RINGERS IV SOLN: INTRAVENOUS | Status: DC

## 2021-05-31 MED ORDER — OXYCODONE HCL 5 MG PO TABS
5.0000 mg | ORAL_TABLET | ORAL | Status: DC | PRN
  Administered 2021-05-31: 10 mg via ORAL
  Filled 2021-05-31: qty 2

## 2021-05-31 MED ORDER — TOBRAMYCIN SULFATE 1.2 G IJ SOLR
INTRAMUSCULAR | Status: AC
  Filled 2021-05-31: qty 2.4

## 2021-05-31 MED ORDER — FENTANYL CITRATE PF 50 MCG/ML IJ SOSY: 25.0000 ug | PREFILLED_SYRINGE | INTRAMUSCULAR | Status: DC | PRN

## 2021-05-31 MED ORDER — DOXYCYCLINE HYCLATE 50 MG PO CAPS
50.0000 mg | ORAL_CAPSULE | Freq: Every day | ORAL | Status: DC
  Filled 2021-05-31: qty 1

## 2021-05-31 MED ORDER — PANTOPRAZOLE SODIUM 40 MG PO TBEC
40.0000 mg | DELAYED_RELEASE_TABLET | Freq: Every day | ORAL | Status: DC
Start: 2021-06-01 — End: 2021-06-02
  Administered 2021-06-01 – 2021-06-02 (×2): 40 mg via ORAL
  Filled 2021-05-31 (×2): qty 1

## 2021-05-31 MED ORDER — POLYETHYLENE GLYCOL 3350 17 G PO PACK: 17.0000 g | PACK | Freq: Every day | ORAL | Status: DC | PRN

## 2021-05-31 MED ORDER — TOPIRAMATE 25 MG PO TABS
25.0000 mg | ORAL_TABLET | Freq: Every day | ORAL | Status: DC
Start: 2021-05-31 — End: 2021-06-02
  Administered 2021-05-31 – 2021-06-02 (×3): 25 mg via ORAL
  Filled 2021-05-31 (×3): qty 1

## 2021-05-31 MED ORDER — MENTHOL 3 MG MT LOZG: 1.0000 | LOZENGE | OROMUCOSAL | Status: DC | PRN

## 2021-05-31 MED ORDER — OXYCODONE HCL 5 MG PO TABS: 5.0000 mg | ORAL_TABLET | Freq: Once | ORAL | Status: DC | PRN

## 2021-05-31 MED ORDER — POVIDONE-IODINE 10 % EX SWAB
2.0000 "application " | Freq: Once | CUTANEOUS | Status: AC
  Administered 2021-05-31: 2 via TOPICAL

## 2021-05-31 MED ORDER — LEVOTHYROXINE SODIUM 75 MCG PO TABS
75.0000 ug | ORAL_TABLET | Freq: Every day | ORAL | Status: DC
  Administered 2021-06-01 – 2021-06-02 (×2): 75 ug via ORAL
  Filled 2021-05-31 (×2): qty 1

## 2021-05-31 MED ORDER — ATORVASTATIN CALCIUM 40 MG PO TABS
40.0000 mg | ORAL_TABLET | Freq: Every evening | ORAL | Status: DC
  Administered 2021-05-31 – 2021-06-01 (×2): 40 mg via ORAL
  Filled 2021-05-31 (×2): qty 1

## 2021-05-31 MED ORDER — TRANEXAMIC ACID-NACL 1000-0.7 MG/100ML-% IV SOLN
1000.0000 mg | INTRAVENOUS | Status: AC
  Administered 2021-05-31: 1000 mg via INTRAVENOUS
  Filled 2021-05-31: qty 100

## 2021-05-31 MED ORDER — ONDANSETRON HCL 4 MG/2ML IJ SOLN
INTRAMUSCULAR | Status: DC | PRN
Start: 2021-05-31 — End: 2021-05-31
  Administered 2021-05-31: 4 mg via INTRAVENOUS

## 2021-05-31 MED ORDER — OXYCODONE HCL 5 MG PO TABS
10.0000 mg | ORAL_TABLET | ORAL | Status: DC | PRN
  Administered 2021-05-31 – 2021-06-01 (×2): 15 mg via ORAL
  Filled 2021-05-31 (×3): qty 3

## 2021-05-31 MED ORDER — BUPIVACAINE-EPINEPHRINE (PF) 0.5% -1:200000 IJ SOLN
INTRAMUSCULAR | Status: DC | PRN
  Administered 2021-05-31: 15 mL via PERINEURAL

## 2021-05-31 MED ORDER — EPHEDRINE SULFATE-NACL 50-0.9 MG/10ML-% IV SOSY
PREFILLED_SYRINGE | INTRAVENOUS | Status: DC | PRN
  Administered 2021-05-31 (×2): 5 mg via INTRAVENOUS

## 2021-05-31 MED ORDER — HYDROMORPHONE HCL 1 MG/ML IJ SOLN
0.5000 mg | INTRAMUSCULAR | Status: DC | PRN
  Administered 2021-05-31: 1 mg via INTRAVENOUS
  Administered 2021-06-01: 0.5 mg via INTRAVENOUS
  Filled 2021-05-31 (×3): qty 1

## 2021-05-31 MED ORDER — DIPHENHYDRAMINE HCL 12.5 MG/5ML PO ELIX: 12.5000 mg | ORAL_SOLUTION | ORAL | Status: DC | PRN

## 2021-05-31 MED ORDER — DEXAMETHASONE SODIUM PHOSPHATE 10 MG/ML IJ SOLN
8.0000 mg | Freq: Once | INTRAMUSCULAR | Status: AC
  Administered 2021-05-31: 8 mg via INTRAVENOUS

## 2021-05-31 MED ORDER — PROPOFOL 500 MG/50ML IV EMUL
INTRAVENOUS | Status: AC
  Filled 2021-05-31: qty 50

## 2021-05-31 MED ORDER — TRANEXAMIC ACID-NACL 1000-0.7 MG/100ML-% IV SOLN
1000.0000 mg | Freq: Once | INTRAVENOUS | Status: AC
  Administered 2021-05-31: 1000 mg via INTRAVENOUS
  Filled 2021-05-31: qty 100

## 2021-05-31 MED ORDER — BUPIVACAINE-EPINEPHRINE 0.25% -1:200000 IJ SOLN
INTRAMUSCULAR | Status: DC | PRN
  Administered 2021-05-31: 30 mL

## 2021-05-31 MED ORDER — CHLORHEXIDINE GLUCONATE 0.12 % MT SOLN
15.0000 mL | Freq: Once | OROMUCOSAL | Status: AC
  Administered 2021-05-31: 15 mL via OROMUCOSAL

## 2021-05-31 MED ORDER — METOCLOPRAMIDE HCL 5 MG/ML IJ SOLN: 5.0000 mg | Freq: Three times a day (TID) | INTRAMUSCULAR | Status: DC | PRN

## 2021-05-31 MED ORDER — ACETAMINOPHEN 500 MG PO TABS
1000.0000 mg | ORAL_TABLET | Freq: Once | ORAL | Status: AC
  Administered 2021-05-31: 1000 mg via ORAL
  Filled 2021-05-31: qty 2

## 2021-05-31 MED ORDER — METHOCARBAMOL 1000 MG/10ML IJ SOLN
500.0000 mg | Freq: Four times a day (QID) | INTRAVENOUS | Status: DC | PRN
  Filled 2021-05-31: qty 5

## 2021-05-31 MED ORDER — FERROUS SULFATE 325 (65 FE) MG PO TABS
325.0000 mg | ORAL_TABLET | Freq: Three times a day (TID) | ORAL | Status: DC
  Administered 2021-05-31 – 2021-06-02 (×5): 325 mg via ORAL
  Filled 2021-05-31 (×5): qty 1

## 2021-05-31 MED ORDER — LORATADINE 10 MG PO TABS
10.0000 mg | ORAL_TABLET | Freq: Every day | ORAL | Status: DC
  Administered 2021-06-01 – 2021-06-02 (×2): 10 mg via ORAL
  Filled 2021-05-31 (×2): qty 1

## 2021-05-31 MED ORDER — ONDANSETRON HCL 4 MG/2ML IJ SOLN: 4.0000 mg | Freq: Four times a day (QID) | INTRAMUSCULAR | Status: DC | PRN

## 2021-05-31 MED ORDER — KETOROLAC TROMETHAMINE 30 MG/ML IJ SOLN
INTRAMUSCULAR | Status: DC | PRN
  Administered 2021-05-31: 30 mg via INTRAVENOUS

## 2021-05-31 MED ORDER — BUPIVACAINE HCL (PF) 0.5 % IJ SOLN
INTRAMUSCULAR | Status: DC | PRN
  Administered 2021-05-31: 3 mL via INTRATHECAL

## 2021-05-31 MED ORDER — DEXAMETHASONE SODIUM PHOSPHATE 10 MG/ML IJ SOLN
10.0000 mg | Freq: Once | INTRAMUSCULAR | Status: AC
  Administered 2021-06-01: 10 mg via INTRAVENOUS
  Filled 2021-05-31: qty 1

## 2021-05-31 MED ORDER — CIPROFLOXACIN HCL 0.3 % OP SOLN
1.0000 [drp] | Freq: Three times a day (TID) | OPHTHALMIC | Status: AC
  Administered 2021-05-31 – 2021-06-01 (×3): 1 [drp] via OPHTHALMIC
  Filled 2021-05-31: qty 2.5

## 2021-05-31 MED ORDER — SODIUM CHLORIDE (PF) 0.9 % IJ SOLN
INTRAMUSCULAR | Status: AC
  Filled 2021-05-31: qty 50

## 2021-05-31 MED ORDER — MIDAZOLAM HCL 2 MG/2ML IJ SOLN
1.0000 mg | INTRAMUSCULAR | Status: DC
  Administered 2021-05-31 (×2): 1 mg via INTRAVENOUS
  Filled 2021-05-31: qty 2

## 2021-05-31 MED ORDER — BISACODYL 10 MG RE SUPP: 10.0000 mg | Freq: Every day | RECTAL | Status: DC | PRN

## 2021-05-31 MED ORDER — SERTRALINE HCL 100 MG PO TABS
100.0000 mg | ORAL_TABLET | Freq: Every day | ORAL | Status: DC
Start: 2021-05-31 — End: 2021-06-02
  Administered 2021-05-31 – 2021-06-02 (×3): 100 mg via ORAL
  Filled 2021-05-31 (×3): qty 1

## 2021-05-31 MED ORDER — METFORMIN HCL 500 MG PO TABS
500.0000 mg | ORAL_TABLET | Freq: Every day | ORAL | Status: DC
  Administered 2021-06-01 – 2021-06-02 (×2): 500 mg via ORAL
  Filled 2021-05-31 (×2): qty 1

## 2021-05-31 MED ORDER — SODIUM CHLORIDE 0.9 % IV SOLN: INTRAVENOUS | Status: DC

## 2021-05-31 MED ORDER — KETOROLAC TROMETHAMINE 0.5 % OP SOLN
1.0000 [drp] | Freq: Three times a day (TID) | OPHTHALMIC | Status: AC | PRN
  Administered 2021-05-31: 1 [drp] via OPHTHALMIC
  Filled 2021-05-31 (×2): qty 3

## 2021-05-31 MED ORDER — KETOROLAC TROMETHAMINE 30 MG/ML IJ SOLN
INTRAMUSCULAR | Status: AC
  Filled 2021-05-31: qty 1

## 2021-05-31 MED ORDER — SODIUM CHLORIDE (PF) 0.9 % IJ SOLN
INTRAMUSCULAR | Status: DC | PRN
  Administered 2021-05-31: 30 mL via INTRAVENOUS

## 2021-05-31 MED ORDER — FENTANYL CITRATE PF 50 MCG/ML IJ SOSY
50.0000 ug | PREFILLED_SYRINGE | INTRAMUSCULAR | Status: DC
  Administered 2021-05-31 (×2): 50 ug via INTRAVENOUS
  Filled 2021-05-31: qty 2

## 2021-05-31 MED ORDER — PHENOL 1.4 % MT LIQD: 1.0000 | OROMUCOSAL | Status: DC | PRN

## 2021-05-31 MED ORDER — EPHEDRINE 5 MG/ML INJ
INTRAVENOUS | Status: AC
  Filled 2021-05-31: qty 5

## 2021-05-31 MED ORDER — BSS IO SOLN
15.0000 mL | Freq: Once | INTRAOCULAR | Status: AC
  Administered 2021-05-31: 15 mL
  Filled 2021-05-31: qty 15

## 2021-05-31 MED ORDER — INSULIN ASPART 100 UNIT/ML IJ SOLN
0.0000 [IU] | Freq: Three times a day (TID) | INTRAMUSCULAR | Status: DC
  Administered 2021-06-01: 3 [IU] via SUBCUTANEOUS

## 2021-05-31 MED ORDER — BUPROPION HCL ER (SR) 100 MG PO TB12
200.0000 mg | ORAL_TABLET | Freq: Every day | ORAL | Status: DC
  Administered 2021-06-01 – 2021-06-02 (×2): 200 mg via ORAL
  Filled 2021-05-31 (×2): qty 2

## 2021-05-31 SURGICAL SUPPLY — 67 items
ADAPTER BOLT FEMORAL +2/-2 (Knees) ×1 IMPLANT
ADH SKN CLS APL DERMABOND .7 (GAUZE/BANDAGES/DRESSINGS) ×1
ADPR FEM +2/-2 OFST BOLT (Knees) ×1 IMPLANT
ADPR FEM 5D STRL KN PFC SGM (Orthopedic Implant) ×1 IMPLANT
BAG COUNTER SPONGE SURGICOUNT (BAG) IMPLANT
BAG DECANTER FOR FLEXI CONT (MISCELLANEOUS) IMPLANT
BAG SPEC THK2 15X12 ZIP CLS (MISCELLANEOUS)
BAG SPNG CNTER NS LX DISP (BAG)
BAG ZIPLOCK 12X15 (MISCELLANEOUS) IMPLANT
BLADE SAW SGTL 11.0X1.19X90.0M (BLADE) IMPLANT
BLADE SAW SGTL 13.0X1.19X90.0M (BLADE) ×3 IMPLANT
BLADE SAW SGTL 81X20 HD (BLADE) ×3 IMPLANT
BLADE SURG SZ10 CARB STEEL (BLADE) ×6 IMPLANT
BNDG ELASTIC 6X5.8 VLCR STR LF (GAUZE/BANDAGES/DRESSINGS) ×3 IMPLANT
BRUSH FEMORAL CANAL (MISCELLANEOUS) IMPLANT
CEMENT HV SMART SET (Cement) ×2 IMPLANT
COVER SURGICAL LIGHT HANDLE (MISCELLANEOUS) ×3 IMPLANT
CUFF TOURN SGL QUICK 34 (TOURNIQUET CUFF) ×2
CUFF TRNQT CYL 34X4.125X (TOURNIQUET CUFF) ×2 IMPLANT
DERMABOND ADVANCED (GAUZE/BANDAGES/DRESSINGS) ×1
DERMABOND ADVANCED .7 DNX12 (GAUZE/BANDAGES/DRESSINGS) ×2 IMPLANT
DRAPE INCISE IOBAN 66X45 STRL (DRAPES) ×3 IMPLANT
DRAPE U-SHAPE 47X51 STRL (DRAPES) ×3 IMPLANT
DRESSING AQUACEL AG SP 3.5X10 (GAUZE/BANDAGES/DRESSINGS) IMPLANT
DRSG AQUACEL AG ADV 3.5X10 (GAUZE/BANDAGES/DRESSINGS) ×3 IMPLANT
DRSG AQUACEL AG SP 3.5X10 (GAUZE/BANDAGES/DRESSINGS)
DRSG TELFA 4X10 ISLAND STR (GAUZE/BANDAGES/DRESSINGS) ×1 IMPLANT
DURAPREP 26ML APPLICATOR (WOUND CARE) ×6 IMPLANT
ELECT REM PT RETURN 15FT ADLT (MISCELLANEOUS) ×3 IMPLANT
FEM TC3 RT PFC SIGMA SZ2.5 (Orthopedic Implant) ×2 IMPLANT
FEMORAL ADAPTER (Orthopedic Implant) ×1 IMPLANT
FEMORAL TC3 RT PFC SIGMA SZ2.5 (Orthopedic Implant) IMPLANT
GAUZE SPONGE 2X2 8PLY STRL LF (GAUZE/BANDAGES/DRESSINGS) IMPLANT
GLOVE SURG UNDER POLY LF SZ7.5 (GLOVE) ×6 IMPLANT
GOWN STRL REUS W/TWL LRG LVL3 (GOWN DISPOSABLE) ×3 IMPLANT
HANDPIECE INTERPULSE COAX TIP (DISPOSABLE) ×2
HOLDER FOLEY CATH W/STRAP (MISCELLANEOUS) IMPLANT
INSERT TIBIAL TC3 SZ 2.5 12.5 (Knees) ×1 IMPLANT
KIT TURNOVER KIT A (KITS) IMPLANT
MANIFOLD NEPTUNE II (INSTRUMENTS) ×3 IMPLANT
NDL SAFETY ECLIPSE 18X1.5 (NEEDLE) IMPLANT
NEEDLE HYPO 18GX1.5 SHARP (NEEDLE)
NS IRRIG 1000ML POUR BTL (IV SOLUTION) ×3 IMPLANT
PACK TOTAL KNEE CUSTOM (KITS) ×3 IMPLANT
PROTECTOR NERVE ULNAR (MISCELLANEOUS) ×3 IMPLANT
SET HNDPC FAN SPRY TIP SCT (DISPOSABLE) ×2 IMPLANT
SET PAD KNEE POSITIONER (MISCELLANEOUS) ×3 IMPLANT
SLEEVE UNIV FRM 40MM (Knees) ×1 IMPLANT
SOLUTION IRRIG SURGIPHOR (IV SOLUTION) IMPLANT
SOLUTION PRONTOSAN WOUND 350ML (IRRIGATION / IRRIGATOR) ×1 IMPLANT
SPIKE FLUID TRANSFER (MISCELLANEOUS) IMPLANT
SPONGE GAUZE 2X2 STER 10/PKG (GAUZE/BANDAGES/DRESSINGS)
SPONGE T-LAP 18X18 ~~LOC~~+RFID (SPONGE) ×9 IMPLANT
STAPLER VISISTAT 35W (STAPLE) IMPLANT
STEM UNIVERSAL REVISION 75X16 (Stem) ×1 IMPLANT
SUT MNCRL AB 3-0 PS2 18 (SUTURE) ×3 IMPLANT
SUT STRATAFIX PDS+ 0 24IN (SUTURE) ×3 IMPLANT
SUT VIC AB 1 CT1 36 (SUTURE) ×3 IMPLANT
SUT VIC AB 2-0 CT1 27 (SUTURE) ×6
SUT VIC AB 2-0 CT1 TAPERPNT 27 (SUTURE) ×6 IMPLANT
SYR 50ML LL SCALE MARK (SYRINGE) IMPLANT
TOWER CARTRIDGE SMART MIX (DISPOSABLE) ×3 IMPLANT
TRAY FOLEY MTR SLVR 16FR STAT (SET/KITS/TRAYS/PACK) ×3 IMPLANT
TUBE KAMVAC SUCTION (TUBING) IMPLANT
TUBE SUCTION HIGH CAP CLEAR NV (SUCTIONS) ×3 IMPLANT
WATER STERILE IRR 1000ML POUR (IV SOLUTION) ×3 IMPLANT
WRAP KNEE MAXI GEL POST OP (GAUZE/BANDAGES/DRESSINGS) ×3 IMPLANT

## 2021-05-31 NOTE — Evaluation (Signed)
Physical Therapy Evaluation ?Patient Details ?Name: Melissa Leach ?MRN: [id number] ?DOB: 03/01/1951 ?Today's Date: 05/31/2021 ? ?History of Present Illness ? Pt is a 71 y.o. female s/p Rt total knee revision. PMH signifcant for hypothyroidism, HLD, depression, anxiety, pre-diabetes, OA, R TSA, L reverse total shoulder, and B THA.  ?Clinical Impression ? Pt is POD 0 s/p Rt total knee revision resulting in the deficits listed below (see PT Problem List). Pt performed sit to stand transfers with MIN guard for safety and cues for safe hand placement. Pt ambulated total of ~61ft with MIN guard for safety and cues for RW management. Cues provided for step to gait patten, pt progressing to step through with decreased stride length, with no LOB observed. PT reviewed circulation interventions for promotion of DVT prevention and improved strength/ROM, pt demonstrated understanding. Pt lives alone, but will have assist from family and home health aide upon d/c for at least first 2 weeks. Pt will benefit from skilled PT to maximize safety with functional mobility and increase independence.  ?   ?   ? ?Recommendations for follow up therapy are one component of a multi-disciplinary discharge planning process, led by the attending physician.  Recommendations may be updated based on patient status, additional functional criteria and insurance authorization. ? ?Follow Up Recommendations Follow physician's recommendations for discharge plan and follow up therapies ? ?  ?Assistance Recommended at Discharge Intermittent Supervision/Assistance  ?Patient can return home with the following ? A little help with walking and/or transfers;A little help with bathing/dressing/bathroom;Assistance with cooking/housework;Assist for transportation;Help with stairs or ramp for entrance ? ?  ?Equipment Recommendations None recommended by PT (pt owns RW)  ?Recommendations for Other Services ?    ?  ?Functional Status Assessment Patient has had a recent  decline in their functional status and demonstrates the ability to make significant improvements in function in a reasonable and predictable amount of time.  ? ?  ?Precautions / Restrictions Precautions ?Precautions: Fall ?Restrictions ?Weight Bearing Restrictions: No  ? ?  ? ?Mobility ? Bed Mobility ?Overal bed mobility: Needs Assistance ?Bed Mobility: Supine to Sit ?  ?  ?Supine to sit: Supervision, HOB elevated ?  ?  ?  ?  ? ?Transfers ?Overall transfer level: Needs assistance ?Equipment used: Rolling walker (2 wheels) ?Transfers: Sit to/from Stand ?Sit to Stand: Min guard ?  ?  ?  ?  ?  ?General transfer comment: MIN guard for safety ?  ? ?Ambulation/Gait ?Ambulation/Gait assistance: Min guard ?Gait Distance (Feet): 90 Feet ?Assistive device: Rolling walker (2 wheels) ?Gait Pattern/deviations: Step-to pattern, Step-through pattern, Decreased stride length, Decreased weight shift to right, Antalgic ?Gait velocity: decr ?  ?  ?General Gait Details: cues for step to progressing to step through with no LOB observed. ? ?Stairs ?  ?  ?  ?  ?  ? ?Wheelchair Mobility ?  ? ?Modified Rankin (Stroke Patients Only) ?  ? ?  ? ?Balance Overall balance assessment: Needs assistance ?Sitting-balance support: Feet supported ?Sitting balance-Leahy Scale: Good ?  ?  ?Standing balance support: Bilateral upper extremity supported, During functional activity, Reliant on assistive device for balance ?Standing balance-Leahy Scale: Poor ?  ?  ?  ?  ?  ?  ?  ?  ?  ?  ?  ?  ?   ? ? ? ?Pertinent Vitals/Pain Pain Assessment ?Pain Assessment: 0-10 ?Pain Score: 6  ?Pain Location: Rt knee ?Pain Descriptors / Indicators: Tender, Sharp ?Pain Intervention(s): Limited activity within patient's tolerance, Monitored  during session, Repositioned, Ice applied  ? ? ?Home Living Family/patient expects to be discharged to:: Private residence ?Living Arrangements: Alone ?Available Help at Discharge: Family;Personal care attendant ?Type of Home: House ?Home  Access: Stairs to enter ?Entrance Stairs-Rails: Right ?Entrance Stairs-Number of Steps: 1 ?  ?Home Layout: One level ?Home Equipment: Agricultural consultant (2 wheels);Crutches;Cane - single point;Shower seat;BSC/3in1;Grab bars - toilet;Grab bars - tub/shower ?Additional Comments: sister helping upon d/c. Hired aide coming morning/night for first week and then daughter coming to town to help. using cructhes for 1 day, RW Prior since Feb. 3rd, no AD prior.  ?  ?Prior Function Prior Level of Function : Independent/Modified Independent;Driving ?  ?  ?  ?  ?  ?  ?  ?  ?  ? ? ?Hand Dominance  ?   ? ?  ?Extremity/Trunk Assessment  ? Upper Extremity Assessment ?Upper Extremity Assessment: Overall WFL for tasks assessed ?  ? ?Lower Extremity Assessment ?Lower Extremity Assessment: RLE deficits/detail ?RLE Deficits / Details: Pt with good quad set strength, able to perform SLR with no extensor lag noted. ?  ? ?Cervical / Trunk Assessment ?Cervical / Trunk Assessment: Normal  ?Communication  ? Communication: No difficulties  ?Cognition Arousal/Alertness: Awake/alert ?Behavior During Therapy: [facility] for tasks assessed/performed ?Overall Cognitive Status: Within Functional Limits for tasks assessed ?  ?  ?  ?  ?  ?  ?  ?  ?  ?  ?  ?  ?  ?  ?  ?  ?  ?  ?  ? ?  ?General Comments   ? ?  ?Exercises Total Joint Exercises ?Ankle Circles/Pumps: AROM, Both, 20 reps, Seated ?Quad Sets: AROM, Right, 10 reps, Seated ?Long Arc Quad: AROM, Right, 10 reps, Seated  ? ?Assessment/Plan  ?  ?PT Assessment Patient needs continued PT services  ?PT Problem List Decreased strength;Decreased range of motion;Decreased balance;Decreased activity tolerance;Decreased mobility;Decreased knowledge of use of DME;Pain ? ?   ?  ?PT Treatment Interventions DME instruction;Gait training;Stair training;Functional mobility training;Therapeutic activities;Therapeutic exercise;Balance training;Patient/family education   ? ?PT Goals (Current goals can be found in the Care Plan  section)  ?Acute Rehab PT Goals ?Patient Stated Goal: get back home and have good recovery/less pain ?PT Goal Formulation: With patient ?Time For Goal Achievement: 06/14/21 ?Potential to Achieve Goals: Good ? ?  ?Frequency 7X/week ?  ? ? ?Co-evaluation   ?  ?  ?  ?  ? ? ?  ?AM-PAC PT "6 Clicks" Mobility  ?Outcome Measure Help needed turning from your back to your side while in a flat bed without using bedrails?: None ?Help needed moving from lying on your back to sitting on the side of a flat bed without using bedrails?: A Little ?Help needed moving to and from a bed to a chair (including a wheelchair)?: A Little ?Help needed standing up from a chair using your arms (e.g., wheelchair or bedside chair)?: A Little ?Help needed to walk in hospital room?: A Little ?Help needed climbing 3-5 steps with a railing? : A Little ?6 Click Score: 19 ? ?  ?End of Session Equipment Utilized During Treatment: Gait belt ?Activity Tolerance: Patient tolerated treatment well ?Patient left: in chair;with call bell/phone within reach ?Nurse Communication: Mobility status ?PT Visit Diagnosis: Unsteadiness on feet (R26.81);Muscle weakness (generalized) (M62.81);Pain ?Pain - Right/Left: Right ?Pain - part of body: Knee ?  ? ?Time: 1856-3149 ?PT Time Calculation (min) (ACUTE ONLY): 33 min ? ? ?Charges:   PT Evaluation ?$PT Eval Low  Complexity: 1 Low ?PT Treatments ?$Therapeutic Activity: 8-22 mins ?$Neuromuscular Re-education:  (5 min TE) ?  ?   ? ?Lyman Speller., PT, DPT  ?Acute Rehabilitation Services  ?Office [phone] ? ? ?05/31/2021, 6:08 PM ? ?

## 2021-05-31 NOTE — Anesthesia Postprocedure Evaluation (Addendum)
Anesthesia Post Note ? ?Patient: Melissa Leach ? ?Procedure(s) Performed: TOTAL KNEE REVISION/FEMORAL COMPONENT (Right: Knee) ? ?  ? ?Patient location during evaluation: PACU ?Anesthesia Type: Spinal ?Level of consciousness: oriented and awake and alert ?Pain management: pain level controlled ?Vital Signs Assessment: post-procedure vital signs reviewed and stable ?Respiratory status: spontaneous breathing, respiratory function stable and nonlabored ventilation ?Cardiovascular status: blood pressure returned to baseline and stable ?Postop Assessment: no headache, no backache, no apparent nausea or vomiting, spinal receding and patient able to bend at knees ?Anesthetic complications: no ?Comments: FB sensation OS, no visible corneal abrasion but will treat as such. ? ? ?No notable events documented. ? ?Last Vitals:  ?Vitals:  ? 05/31/21 1415 05/31/21 1430  ?BP: (!) 98/58 (!) 101/58  ?Pulse: 80 79  ?Resp: 15 15  ?Temp:  36.6 ?C  ?SpO2: 98% 98%  ?  ?Last Pain:  ?Vitals:  ? 05/31/21 1430  ?TempSrc:   ?PainSc: 0-No pain  ? ? ?  ?  ?  ?  ?  ?  ? ?Carlise Stofer A. ? ? ? ? ?

## 2021-05-31 NOTE — Addendum Note (Signed)
Addendum  created 05/31/21 1532 by Josephine Igo, MD  ? Clinical Note Signed, Order list changed, Pharmacy for encounter modified  ?  ?

## 2021-05-31 NOTE — H&P (Signed)
TOTAL KNEE REVISION ADMISSION H&P  Patient is being admitted for right revision total knee arthroplasty.  Subjective:  Chief Complaint:right knee pain and broken femoral component  HPI: Melissa Leach, 71 y.o. female, has a history of multiple right knee surgeries. She had an index total knee arthroplasty many years ago, with subsequent single component tibial revision in 2013. She has done well since then, until she began having some pain over the past 6 months. A few weeks ago, she had significant increase in pain as well as mechanical symptoms, and presented to our clinic. X-rays in clinic revealed a broken femoral component. We discussed this diagnosis and need for revision, which she agreed with.    Patient Active Problem List   Diagnosis Date Noted   Chronic migraine without aura without status migrainosus, not intractable 08/11/2020   Bruxism 08/11/2020   Hypothyroidism 05/08/2020   Status post reverse total shoulder replacement, left 04/07/2020   Insulin resistance 10/13/2019   Hypercholesterolemia 07/26/2019   Other specified hypothyroidism 07/26/2019   Vitamin D deficiency 05/11/2019   Prediabetes 05/11/2019   Pain of right shoulder joint on movement 05/06/2017   S/P shoulder replacement, right [id number]   Lichenoid dermatitis 05/17/2016   Gallstones 08/11/2012   Postoperative wound infection 07/21/2012   Expected blood loss anemia 03/04/2012   S/P right THA, AA 03/02/2012   History of revision of total replacement of right hip joint 03/02/2012   Obese 12/10/2011   Past Medical History:  Diagnosis Date   Anxiety    Arthritis    SEVERE PAIN RIGHT HIP   Blood in urine    "ALWAYS"  --AND STATES ALWAYS TREATED FOR UTI--BUT HAS NEVER HAD UTI.   Cellulitis    Chronic back pain    Complication of anesthesia    "SCRATCHED CORNEA"  WAKING UP FROM  KNEE SURGERY--'82   Depression    PT'S HUSBAND DIED OF CANCER AT [facility] 02-23-2012   Gastric ulcer    Gastritis    HISTORY OF  GASTRITIS--OCCAS ABDOMINAL PAIN    GERD (gastroesophageal reflux disease)    takes Omeprazole daily and states only bc she takes Mobic   H/O hiatal hernia    Headache(784.0)    MIGRAINE- INFREQUENT   History of migraine    takes Topamax daily and Imitrex prn   History of shingles    Hyperlipidemia    takes Lipitor daily   Hypothyroidism 05/13/2012   Insomnia    takes Xanax and Ativan prn   Joint pain    Joint swelling    Lichenoid dermatitis 03/16/2007   perianal biopsy   Nocturia    Osteoarthritis    Pre-diabetes    Recurrent sinus infections    Stomach ulcer    Vitamin D deficiency     Past Surgical History:  Procedure Laterality Date   COLONOSCOPY     epidural injections     x 2 in the past 4weeks   ESOPHAGOGASTRODUODENOSCOPY     EYE SURGERY  04/01/1992   RA surgery-   HEMATOMA EVACUATION  12/09/2011   Procedure: EVACUATION HEMATOMA;  Surgeon: Mauri Pole, MD;  Location: [facility];  Service: Orthopedics;  Laterality: Right;  Evacuation of Hematoma Right Knee, I & D and Closed Manipulation of Right Knee   IRRIGATION AND DEBRIDEMENT KNEE  12/09/2011   Procedure: IRRIGATION AND DEBRIDEMENT KNEE;  Surgeon: Mauri Pole, MD;  Location: [facility];  Service: Orthopedics;  Laterality: Right;   JOINT REPLACEMENT     RIGHT  TOTAL KNEE REPLACEMENT AND RT TOTAL KNEE REVISION    AND  LEFT TOTAL KNEE REPLACEMENT   KNEE CLOSED REDUCTION  12/09/2011   Procedure: CLOSED MANIPULATION KNEE;  Surgeon: Mauri Pole, MD;  Location: [facility];  Service: Orthopedics;  Laterality: Right;   LUMBAR SPINE SURGERY  05/15/2012   3 disc fusion   REVERSE SHOULDER ARTHROPLASTY Left 04/07/2020   Procedure: REVERSE SHOULDER ARTHROPLASTY;  Surgeon: Netta Cedars, MD;  Location: [facility];  Service: Orthopedics;  Laterality: Left;   right carpal tunnel surgery      TONSILLECTOMY     TOTAL HIP ARTHROPLASTY  06/25/2011   Procedure: TOTAL HIP ARTHROPLASTY ANTERIOR APPROACH;  Surgeon: Mauri Pole, MD;   Location: [facility];  Service: Orthopedics;  Laterality: Left;   TOTAL HIP ARTHROPLASTY  03/02/2012   Procedure: TOTAL HIP ARTHROPLASTY ANTERIOR APPROACH;  Surgeon: Mauri Pole, MD;  Location: [facility];  Service: Orthopedics;  Laterality: Right;   TOTAL KNEE REVISION  10/28/2011   Procedure: TOTAL KNEE REVISION;  Surgeon: Mauri Pole, MD;  Location: [facility];  Service: Orthopedics;  Laterality: Right;   TOTAL SHOULDER ARTHROPLASTY Right 01/03/2017   Procedure: RIGHT SHOULDER ANATOMIC SHOULDER REPLACEMENT AND ROTATOR CUFF REPAIR;  Surgeon: Netta Cedars, MD;  Location: [facility];  Service: Orthopedics;  Laterality: Right;   TUBAL LIGATION     WOUND EXPLORATION Bilateral 07/06/2012   Procedure: WOUND EXPLORATION lumbar;  Surgeon: Charlie Pitter, MD;  Location: [facility];  Service: Neurosurgery;  Laterality: Bilateral;  Incision and Drainage    No current facility-administered medications for this encounter.   Current Outpatient Medications  Medication Sig Dispense Refill Last Dose   atorvastatin (LIPITOR) 40 MG tablet Take 40 mg by mouth every evening.      buPROPion (WELLBUTRIN SR) 200 MG 12 hr tablet Take 1 tablet (200 mg total) by mouth daily. 30 tablet 0    Calcium Carb-Cholecalciferol (CALCIUM 600+D3 PO) Take 1 tablet by mouth daily.      cetirizine (ZYRTEC) 10 MG tablet Take 10 mg by mouth daily.      clobetasol ointment (TEMOVATE) [id number] % Apply 1 application topically 2 (two) times daily. 30 g 3    doxycycline (VIBRAMYCIN) 50 MG capsule Take 50 mg by mouth daily.      hydrocortisone 2.5 % cream Apply 1 application topically daily as needed (irritation).      levothyroxine (SYNTHROID) 75 MCG tablet Take 75 mcg by mouth daily before breakfast.      metFORMIN (GLUCOPHAGE) 500 MG tablet TAKE ONE TABLET DAILY WITH BREAKFAST. 30 tablet 0    methocarbamol (ROBAXIN) 500 MG tablet Take 1 tablet (500 mg total) by mouth every 8 (eight) hours as needed. 40 tablet 1    Multiple Vitamin (MULTIVITAMIN WITH  MINERALS) TABS Take 1 tablet by mouth daily.      Multiple Vitamins-Minerals (PRESERVISION AREDS 2+MULTI VIT PO) Take 1 capsule by mouth 2 (two) times daily.      oxyCODONE (OXY IR/ROXICODONE) 5 MG immediate release tablet Take 5 mg by mouth every 4 (four) hours as needed for pain.      pantoprazole (PROTONIX) 40 MG tablet Take 40 mg by mouth daily.      sertraline (ZOLOFT) 50 MG tablet Take 100 mg by mouth daily.      SUMAtriptan (IMITREX) 100 MG tablet Take 100 mg by mouth every 2 (two) hours as needed for migraine.      topiramate (TOPAMAX) 25 MG tablet TAKE 1  TABLET (25 MG TOTAL) BY MOUTH DAILY. 30 tablet 0    Vitamin D, Ergocalciferol, (DRISDOL) 1.25 MG (50000 UNIT) CAPS capsule TAKE 1 CAPSULE BY MOUTH ONE TIME PER WEEK 4 capsule 0    meloxicam (MOBIC) 15 MG tablet Take 15 mg by mouth daily. (Patient not taking: Reported on 05/28/2021)   Not Taking   Allergies  Allergen Reactions   Ceftriaxone Other (See Comments)    gallstones   Sulfa Antibiotics Rash    Social History   Tobacco Use   Smoking status: Former    Packs/day: 0.50    Types: Cigarettes    Quit date: 10/21/1976    Years since quitting: 44.6   Smokeless tobacco: Never   Tobacco comments:    QUIT SMOKING OVER 25 YRS AGO  Substance Use Topics   Alcohol use: Yes    Alcohol/week: 2.0 standard drinks    Types: 2 Standard drinks or equivalent per week    Comment: Socially    Family History  Problem Relation Age of Onset   Diabetes Mother    Hypertension Mother    Thyroid disease Mother    Depression Mother    Anxiety disorder Mother    Obesity Mother    Thyroid disease Father    Heart failure Father    Hypertension Father    Hyperlipidemia Father    Heart disease Father       Review of Systems  Constitutional:  Negative for chills and fever.  Respiratory:  Negative for cough and shortness of breath.   Cardiovascular:  Negative for chest pain.  Gastrointestinal:  Negative for nausea and vomiting.   Musculoskeletal:  Positive for arthralgias.     Objective:  Physical Exam Very pleasant 71 year old female awake alert and oriented. She is in no acute distress. She has been walking with a knee immobilizer but is challenged with ambulation due to the sense of instability  Right knee exam: No palpable effusion Range of motion produces mechanical symptoms in the knee and thus is limited She does have some lower extremity edema without erythema No significant calf tenderness  Vital signs in last 24 hours:    Labs:  Estimated body mass index is 35.14 kg/m as calculated from the following:   Height as of 05/29/21: 5\' 1"  (1.549 m).   Weight as of 05/29/21: 84.4 kg.  Imaging Review  Imaging: Today I reviewed the radiographs are performed on May 09, 2020. These radiographs reveal failure of her femoral component with fracture of the femoral stem at the junction of the component. This would indicate to me that her femoral stem remains well fixed and due to cantilever forces the stem component junction fatigue and broke. This would indicate lack of distal femoral fixation. Her tibial component remained stable and well fixed.    Assessment/Plan:   Failed femoral component, right knee(s) with failed previous arthroplasty.   The patient history, physical examination, clinical judgment of the provider and imaging studies are consistent with end stage degenerative joint disease of the right knee(s), previous total knee arthroplasty. Revision total knee arthroplasty is deemed medically necessary. The treatment options including medical management, injection therapy, arthroscopy and revision arthroplasty were discussed at length. The risks and benefits of revision total knee arthroplasty were presented and reviewed. The risks due to aseptic loosening, infection, stiffness, patella tracking problems, thromboembolic complications and other imponderables were discussed. The patient acknowledged  the explanation, agreed to proceed with the plan and consent was signed. Patient is being admitted  for inpatient treatment for surgery, pain control, PT, OT, prophylactic antibiotics, VTE prophylaxis, progressive ambulation and ADL's and discharge planning.The patient is planning to be discharged  home.     Costella Hatcher, PA-C Orthopedic Surgery [facility] [place] [phone]

## 2021-05-31 NOTE — Op Note (Signed)
NAME: Melissa Leach, Melissa K. ?MEDICAL RECORD NO: [id number] ?ACCOUNT NO: [id number] ?DATE OF BIRTH: May 17, 1950 ?FACILITY: [facility] ?LOCATION: [facility] ?PHYSICIAN: Madlyn Frankel. Charlann Boxer, MD ? ?Operative Report  ? ?DATE OF PROCEDURE: 05/31/2021 ? 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 ? 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 ? ?PROCEDURE:  Revision right total knee arthroplasty with revision of the femoral component utilizing a DePuy Sigma TC3 revision femoral component size 2.5 with a 5-degree bolt and +2 adapter, with a 40 mm porous sleeve and a 75 x 16 press-fit stem and a  ?12.5 TC3 insert to match the 2.5 femur. ? ?SURGEON:  Durene Romans, MD ? ?ASSISTANT:  Rosalene Billings, PA-C.  Note that Ms. Domenic Schwab was present for the entirety of the case from preoperative positioning, perioperative management of the operative extremity, general facilitation of the case and primary wound closure. ? ?ANESTHESIA:  Regional plus spinal. ? ?COMPLICATIONS:  None apparent. ? ?BLOOD LOSS:  Less than 100 mL. ? ?TOURNIQUET:  Up for close to 2 hours at 225 mmHg. ? ?INDICATIONS FOR PROCEDURE:  The patient is a very pleasant 71 year old female with longstanding history of surgery involving her right knee.  Her right knee was revised around 20 years ago at [facility].  She was under my care for her right knee in  ?2013, when she had developed loosening of her tibial component.  At that time, we revised her right tibial component but maintained her femoral component.  She presented recently with increased pain and instability to her right knee.  Radiographs  ?revealed breakage of the distal aspect of the femoral component at the bolt junction.  Based on this, the indications for surgery were quite clear that she needed to have her femoral component  revised.  We reviewed the risks and challenges of revision in ? this setting with regards to hardware.  We discussed risks of infection, DVT.  We discussed the potential for breakage of other hardware and the need for future surgeries.  We discussed and reviewed the postoperative course and expectations in this  ?setting.  Consent was obtained for benefit of pain relief. ? ?DESCRIPTION OF PROCEDURE:  The patient was brought to the operative theater.  Once adequate anesthesia, preoperative antibiotics, Ancef administered, she was positioned supine with a right thigh tourniquet placed.  The right lower extremity was carefully ? prepped and draped in sterile fashion.  A timeout was performed, identifying the patient, planned procedure, and extremity.  Her old incision was excised.  Soft tissue planes created.  Median arthrotomy was made, encountering clear synovial fluid other  ?than significant metal staining of her synovial lining of the knee.  Following initial debridement of this metalosis stained synovium as well as generalized exposure of the knee, I was able to flex the knee up with the patella retracted laterally with  ?subluxation.  The femoral component was obviously loose as anticipated.  We were able to remove the femoral component and the polyethylene.  At this point, I spent time further debriding as much of the metal stained synovium as I could.  This included  ?around some of the bone to perform as much debridement.  There was significant posterior synovial staining which I did not do a tremendous amount of debridement on based on the proximity to the posterior neurovascular structures.  Once the debridement  ?  was carried out, we worked on removing her cemented femoral stem.  I used a combination of flexible osteotomes and was able to remove the stem and then spent time to remove the remaining cement.  Once I had the cement removed without apparent  ?complication, we reamed from a 12 mm reamer up to  initially 15 mm for the possibility of a cemented component.  I then broached the distal aspect of the femur and with each broach, we trialed to determine whether or not we would need a larger broach for  ?extension gap balancing.  As I went through this process through trial reductions, we ended up with a 40 mm sleeve, which helped to balance our extension and flexion gaps.  Additionally, I changed her from a size 2 femur to a size 2.5 to help with the  ?posterior gap and flexion balance.  I elected to use the press-fit sleeve.  In addition, I decided to use a press-fit stem.  For that reason, I had to ream up to 16 mm for this to adequately fit into the canal.  Once this was done, we irrigated the canal ? with pulse lavage irrigation.  We then irrigated the remaining knee with pulse lavage.  We irrigated the knee also with Prontosan.  Final components were opened and configured on the back table under my direct supervision including the orientation of  ?the sleeve to this component to make certain we had the necessary external rotation.  I then mixed cement with the idea of placing cement in the distal femur to provide some support to try to minimize some of this cantilever stress on the distal aspect  ?of the stem to minimize early failure as well as hopeful long-term fatigue failure.  Once the cement was mixed, I placed this all around the distal femur, not to interfere with the press-fit sleeve and then the component was impacted.  We had evaluated  ?the trial reductions based on her medial epicondyle that was remaining and made certain that the final component reached the same level.  Once this was done, the knee was brought to extension with a 12.5 insert and the knee was brought to extension and  ?held in extension until the cement cured.  Once the cement fully cured, we elected to use a 12.5 insert.  The final 12.5 TC3 insert to match the 2.5 femur was opened and placed into the tibia.  The knee was then  irrigated again and the remaining  ?Prontosan was placed into the knee.  We then reapproximated the extensor mechanism using #1 Vicryl and #1 Stratafix suture.  The remainder of the wound was closed with 2-0 Vicryl and running Monocryl stitch.  The knee was then cleaned, dried, and dressed ? sterilely using surgical glue and Aquacel dressing.  The patient was then brought to the recovery room in stable condition, tolerated the procedure well.  Findings reviewed with family. ? ?Postoperatively, we will have her be partial weightbearing for likely 6 weeks to allow for bony ingrowth and protect the joint.  She will otherwise be able to work on motion. ? ? ?SHW ?D: 05/31/2021 1:11:11 pm T: 05/31/2021 10:45:00 pm  ?JOB: [id number]/ [id number]  ?

## 2021-05-31 NOTE — Discharge Instructions (Signed)

## 2021-05-31 NOTE — Progress Notes (Signed)
Assisted Dr. Kathryn Howze with Right Knee Adductor Canal block. Side rails up, monitors on throughout procedure. See vital signs in flow sheet. Tolerated Procedure well.  

## 2021-05-31 NOTE — Plan of Care (Signed)
?  Problem: Education: ?Goal: Knowledge of General Education information will improve ?Description: Including pain rating scale, medication(s)/side effects and non-pharmacologic comfort measures ?Outcome: Progressing ?  ?Problem: Clinical Measurements: ?Goal: Ability to maintain clinical measurements within normal limits will improve ?Outcome: Progressing ?  ?Problem: Education: ?Goal: Knowledge of the prescribed therapeutic regimen will improve ?Outcome: Progressing ?  ?Problem: Activity: ?Goal: Range of joint motion will improve ?Outcome: Progressing ?  ?Problem: Pain Management: ?Goal: Pain level will decrease with appropriate interventions ?Outcome: Progressing ?  ?

## 2021-05-31 NOTE — Anesthesia Procedure Notes (Signed)
Spinal ? ?Patient location during procedure: OR ?Start time: 05/31/2021 10:39 AM ?End time: 05/31/2021 10:42 AM ?Reason for block: surgical anesthesia ?Staffing ?Performed: anesthesiologist  ?Anesthesiologist: Kaylyn Layer, MD ?Preanesthetic Checklist ?Completed: patient identified, IV checked, risks and benefits discussed, surgical consent, monitors and equipment checked, pre-op evaluation and timeout performed ?Spinal Block ?Patient position: sitting ?Prep: DuraPrep and site prepped and draped ?Patient monitoring: continuous pulse ox, blood pressure and heart rate ?Approach: midline ?Location: L3-4 ?Injection technique: single-shot ?Needle ?Needle type: Pencan  ?Needle gauge: 24 G ?Needle length: 9 cm ?Assessment ?Events: CSF return ?Additional Notes ?Risks, benefits, and alternative discussed. Patient gave consent to procedure. Prepped and draped in sitting position. Patient sedated but responsive to voice. Clear CSF obtained after one needle pass. Positive terminal aspiration. No pain or paraesthesias with injection. Patient tolerated procedure well. Vital signs stable. Amalia Greenhouse, MD ? ? ? ? ?

## 2021-05-31 NOTE — Brief Op Note (Signed)
05/31/2021 ? ?1:00 PM ? ?PATIENT:  Melissa Leach  71 y.o. female ? ?PRE-OPERATIVE DIAGNOSIS:  Failed right total hip replacement due to breakage of femoral component distally at bolt junction ? ?POST-OPERATIVE DIAGNOSIS:  Failed right total hip replacement due to breakage of femoral component distally at bolt junction ? ?PROCEDURE:  Procedure(s): ?TOTAL KNEE REVISION/FEMORAL COMPONENT (Right) ? ?SURGEON:  Surgeon(s) and Role: ?   Paralee Cancel, MD - Primary ? ?PHYSICIAN ASSISTANT: Costella Hatcher, PA-C ? ?ANESTHESIA:   regional and spinal ? ?EBL:  50 mL  ? ?BLOOD ADMINISTERED:none ? ?DRAINS: none  ? ?LOCAL MEDICATIONS USED:  MARCAINE    ? ?SPECIMEN:  No Specimen ? ?DISPOSITION OF SPECIMEN:  N/A ? ?COUNTS:  YES ? ?TOURNIQUET:  120 min at 225 mmHg ? ?DICTATION: .Other Dictation: Dictation Number [id number] ? ?PLAN OF CARE: Admit to inpatient  ? ?PATIENT DISPOSITION:  PACU - hemodynamically stable. ?  ?Delay start of Pharmacological VTE agent (>24hrs) due to surgical blood loss or risk of bleeding: no ? ?

## 2021-05-31 NOTE — Interval H&P Note (Signed)
History and Physical Interval Note: ? ?05/31/2021 ?9:07 AM ? ?Melissa Leach  has presented today for surgery, with the diagnosis of FRACTURED RIGHT TOTAL KNEE FEMORAL COMPONENT.  The various methods of treatment have been discussed with the patient and family. After consideration of risks, benefits and other options for treatment, the patient has consented to  Procedure(s): ?TOTAL KNEE REVISION/FEMORAL COMPONENT (Right) as a surgical intervention.  The patient's history has been reviewed, patient examined, no change in status, stable for surgery.  I have reviewed the patient's chart and labs.  Questions were answered to the patient's satisfaction.   ? ? ?[name] ? ? ?

## 2021-05-31 NOTE — Telephone Encounter (Signed)
Message sent to pt via My chart

## 2021-05-31 NOTE — Anesthesia Procedure Notes (Signed)
Anesthesia Regional Block: Adductor canal block  ? ?Pre-Anesthetic Checklist: , timeout performed,  Correct Patient, Correct Site, Correct Laterality,  Correct Procedure, Correct Position, site marked,  Risks and benefits discussed,  Pre-op evaluation,  At surgeon's request and post-op pain management ? ?Laterality: Right ? ?Prep: Maximum Sterile Barrier Precautions used, chloraprep     ?  ?Needles:  ?Injection technique: Single-shot ? ?Needle Type: Echogenic Stimulator Needle   ? ? ?Needle Length: 9cm  ?Needle Gauge: 22  ? ? ? ?Additional Needles: ? ? ?Procedures:,,,, ultrasound used (permanent image in chart),,    ?Narrative:  ?Start time: 05/31/2021 10:19 AM ?End time: 05/31/2021 10:22 AM ?Injection made incrementally with aspirations every 5 mL. ? ?Performed by: Personally  ?Anesthesiologist: Kaylyn Layer, MD ? ?Additional Notes: ?Risks, benefits, and alternative discussed. Patient gave consent for procedure. Patient prepped and draped in sterile fashion. Sedation administered, patient remains easily responsive to voice. Relevant anatomy identified with ultrasound guidance. Local anesthetic given in 5cc increments with no signs or symptoms of intravascular injection. No pain or paraesthesias with injection. Patient monitored throughout procedure with signs of LAST or immediate complications. Tolerated well. Ultrasound image placed in chart.  ?Amalia Greenhouse, MD ? ? ? ? ? ? ?

## 2021-05-31 NOTE — Transfer of Care (Signed)
Immediate Anesthesia Transfer of Care Note ? ?Patient: Melissa Leach ? ?Procedure(s) Performed: TOTAL KNEE REVISION/FEMORAL COMPONENT (Right: Knee) ? ?Patient Location: PACU ? ?Anesthesia Type:MAC and Spinal ? ?Level of Consciousness: awake, alert , oriented and patient cooperative ? ?Airway & Oxygen Therapy: Patient Spontanous Breathing and Patient connected to face mask oxygen ? ?Post-op Assessment: Report given to RN and Post -op Vital signs reviewed and stable ? ?Post vital signs: Reviewed and stable ? ?Last Vitals:  ?Vitals Value Taken Time  ?BP 103/56 05/31/21 1328  ?Temp    ?Pulse 88 05/31/21 1328  ?Resp 18 05/31/21 1328  ?SpO2 100 % 05/31/21 1328  ?Vitals shown include unvalidated device data. ? ?Last Pain:  ?Vitals:  ? 05/31/21 0848  ?TempSrc:   ?PainSc: 7   ?   ? ?Patients Stated Pain Goal: 6 (05/31/21 0848) ? ?Complications: No notable events documented. ?

## 2021-06-01 ENCOUNTER — Inpatient Hospital Stay (HOSPITAL_COMMUNITY): Payer: Medicare HMO

## 2021-06-01 LAB — BASIC METABOLIC PANEL
Anion gap: 9 (ref 5–15)
BUN: 15 mg/dL (ref 8–23)
CO2: 23 mmol/L (ref 22–32)
Calcium: 8.5 mg/dL — ABNORMAL LOW (ref 8.9–10.3)
Chloride: 103 mmol/L (ref 98–111)
Creatinine, Ser: 0.83 mg/dL (ref 0.44–1.00)
GFR, Estimated: >60 mL/min (ref >60)
Glucose, Bld: 116 mg/dL — ABNORMAL HIGH (ref 70–99)
Potassium: 3.8 mmol/L (ref 3.5–5.1)
Sodium: 135 mmol/L (ref 135–145)

## 2021-06-01 LAB — CBC
HCT: 34.3 % — ABNORMAL LOW (ref 36.0–46.0)
Hemoglobin: 10.7 g/dL — ABNORMAL LOW (ref 12.0–15.0)
MCH: 30.8 pg (ref 26.0–34.0)
MCHC: 31.2 g/dL (ref 30.0–36.0)
MCV: 98.8 fL (ref 80.0–100.0)
Platelets: 204 10*3/uL (ref 150–400)
RBC: 3.47 MIL/uL — ABNORMAL LOW (ref 3.87–5.11)
RDW: 14.4 % (ref 11.5–15.5)
WBC: 8.7 10*3/uL (ref 4.0–10.5)
nRBC: 0 % (ref 0.0–0.2)

## 2021-06-01 LAB — GLUCOSE, CAPILLARY
Glucose-Capillary: 99 mg/dL (ref 70–99)
Glucose-Capillary: 119 mg/dL — ABNORMAL HIGH (ref 70–99)
Glucose-Capillary: 160 mg/dL — ABNORMAL HIGH (ref 70–99)
Glucose-Capillary: 143 mg/dL — ABNORMAL HIGH (ref 70–99)

## 2021-06-01 MED ORDER — HYDROMORPHONE HCL 2 MG PO TABS
2.0000 mg | ORAL_TABLET | ORAL | Status: DC | PRN
  Administered 2021-06-01 – 2021-06-02 (×6): 4 mg via ORAL
  Filled 2021-06-01 (×6): qty 2

## 2021-06-01 MED ORDER — CEFADROXIL 500 MG PO CAPS
500.0000 mg | ORAL_CAPSULE | Freq: Two times a day (BID) | ORAL | Status: DC
  Administered 2021-06-01 – 2021-06-02 (×2): 500 mg via ORAL
  Filled 2021-06-01 (×2): qty 1

## 2021-06-01 MED ORDER — ASPIRIN 81 MG PO CHEW: 81.0000 mg | CHEWABLE_TABLET | Freq: Two times a day (BID) | ORAL | 0 refills | Status: AC

## 2021-06-01 MED ORDER — KETOROLAC TROMETHAMINE 15 MG/ML IJ SOLN
7.5000 mg | Freq: Four times a day (QID) | INTRAMUSCULAR | Status: AC
  Administered 2021-06-01 (×2): 7.5 mg via INTRAVENOUS
  Filled 2021-06-01 (×2): qty 1

## 2021-06-01 MED ORDER — METHOCARBAMOL 500 MG PO TABS: 500.0000 mg | ORAL_TABLET | Freq: Four times a day (QID) | ORAL | 0 refills | Status: DC | PRN

## 2021-06-01 MED ORDER — HYDROMORPHONE HCL 2 MG PO TABS: 2.0000 mg | ORAL_TABLET | ORAL | 0 refills | Status: DC | PRN

## 2021-06-01 MED ORDER — CEFADROXIL 500 MG PO CAPS: 500.0000 mg | ORAL_CAPSULE | Freq: Two times a day (BID) | ORAL | 0 refills | Status: AC

## 2021-06-01 MED ORDER — POLYETHYLENE GLYCOL 3350 17 G PO PACK: 17.0000 g | PACK | Freq: Every day | ORAL | 0 refills | Status: DC | PRN

## 2021-06-01 MED ORDER — DOXYCYCLINE MONOHYDRATE 25 MG/5ML PO SUSR
50.0000 mg | Freq: Every day | ORAL | Status: DC
  Administered 2021-06-01 – 2021-06-02 (×2): 50 mg via ORAL
  Filled 2021-06-01 (×2): qty 10

## 2021-06-01 MED ORDER — DOCUSATE SODIUM 100 MG PO CAPS: 100.0000 mg | ORAL_CAPSULE | Freq: Two times a day (BID) | ORAL | 0 refills | Status: DC

## 2021-06-01 NOTE — Progress Notes (Addendum)
? ?Subjective: ?1 Day Post-Op Procedure(s) (LRB): ?TOTAL KNEE REVISION/FEMORAL COMPONENT (Right) ?Patient reports pain as mild.   ?Patient seen in rounds with Dr. Charlann Boxer. ?Patient is well, and has had no acute complaints or problems. No acute events overnight. Foley catheter removed. Patient ambulated 90 feet with PT. She reports severe pain overnight, but not necessarily when she was walking with PT.  ?We will continue therapy today.  ? ?Objective: ?Vital signs in last 24 hours: ?Temp:  [97.5 ?F (36.4 ?C)-98.1 ?F (36.7 ?C)] 98 ?F (36.7 ?C) (03/03 0530) ?Pulse Rate:  [70-92] 70 (03/03 0530) ?Resp:  [14-26] 16 (03/03 0530) ?BP: (98-132)/(51-80) 128/72 (03/03 0530) ?SpO2:  [94 %-100 %] 100 % (03/03 0530) ?Weight:  [84.4 kg] 84.4 kg (03/02 0830) ? ?Intake/Output from previous day: ? ?Intake/Output Summary (Last 24 hours) at 06/01/2021 0748 ?Last data filed at 06/01/2021 0556 ?Gross per 24 hour  ?Intake 4396.17 ml  ?Output 3400 ml  ?Net 996.17 ml  ?  ? ?Intake/Output this shift: ?No intake/output data recorded. ? ?Labs: ?Recent Labs  ?  05/29/21 ?1248 06/01/21 ?0316  ?HGB 12.4 10.7*  ? ?Recent Labs  ?  05/29/21 ?1248 06/01/21 ?0316  ?WBC 6.6 8.7  ?RBC 3.99 3.47*  ?HCT 39.0 34.3*  ?PLT 261 204  ? ?Recent Labs  ?  05/29/21 ?1248 06/01/21 ?0316  ?NA 134* 135  ?K 4.2 3.8  ?CL 100 103  ?CO2 27 23  ?BUN 23 15  ?CREATININE 1.30* 0.83  ?GLUCOSE 95 116*  ?CALCIUM 9.2 8.5*  ? ?No results for input(s): LABPT, INR in the last 72 hours. ? ?Exam: ?General - Patient is Alert and Oriented ?Extremity - Neurologically intact ?Sensation intact distally ?Intact pulses distally ?Dorsiflexion/Plantar flexion intact ?Dressing - dressing C/D/I ?Motor Function - intact, moving foot and toes well on exam.  ? ?Past Medical History:  ?Diagnosis Date  ? Anxiety   ? Arthritis   ? SEVERE PAIN RIGHT HIP  ? Blood in urine   ? "ALWAYS"  --AND STATES ALWAYS TREATED FOR UTI--BUT HAS NEVER HAD UTI.  ? Cellulitis   ? Chronic back pain   ? Complication of  anesthesia   ? "SCRATCHED CORNEA"  WAKING UP FROM  KNEE SURGERY--'82  ? Depression   ? PT'S HUSBAND DIED OF CANCER AT [facility] February 17, 2012  ? Gastric ulcer   ? Gastritis   ? HISTORY OF GASTRITIS--OCCAS ABDOMINAL PAIN   ? GERD (gastroesophageal reflux disease)   ? takes Omeprazole daily and states only bc she takes Mobic  ? H/O hiatal hernia   ? Headache(784.0)   ? MIGRAINE- INFREQUENT  ? History of migraine   ? takes Topamax daily and Imitrex prn  ? History of shingles   ? Hyperlipidemia   ? takes Lipitor daily  ? Hypothyroidism 05/13/2012  ? Insomnia   ? takes Xanax and Ativan prn  ? Joint pain   ? Joint swelling   ? Lichenoid dermatitis 03/16/2007  ? perianal biopsy  ? Nocturia   ? Osteoarthritis   ? Pre-diabetes   ? Recurrent sinus infections   ? Stomach ulcer   ? Vitamin D deficiency   ? ? ?Assessment/Plan: ?1 Day Post-Op Procedure(s) (LRB): ?TOTAL KNEE REVISION/FEMORAL COMPONENT (Right) ?Principal Problem: ?  S/P revision of total knee, right ? ?Estimated body mass index is 35.16 kg/m? as calculated from the following: ?  Height as of this encounter: 5\' 1"  (1.549 m). ?  Weight as of this encounter: 84.4 kg. ?Advance diet ?Up with therapy ?  ? ?  DVT Prophylaxis - Aspirin ?PWB 50% RLE per Dr. Charlann Boxer ?Hgb stable at 10.7 this AM. ? ?Patient having significant post op pain in the setting of major revision surgery. She will require another night stay to continue optimizing pain control and medication usage. Will practice PWB with PT today. ? ?We will obtain XR to evaluate the knee due to significant pain.  ? ?Plan for discharge home tomorrow once meeting goals with PT.  ? ? ? ?Dennie Bible, PA-C ?Orthopedic Surgery ?[phone] ?06/01/2021, 7:48 AM  ?

## 2021-06-01 NOTE — TOC Transition Note (Signed)
Transition of Care (TOC) - CM/SW Discharge Note ? ?Patient Details  ?Name: Nallely Yost ?MRN: [id number] ?Date of Birth: 1950/09/30 ? ?Transition of Care (TOC) CM/SW Contact:  ?Sherie Don, LCSW ?Phone Number: ?06/01/2021, 9:51 AM ? ?Clinical Narrative: Patient is expected to discharge home after working with PT. CSW met with patient to review discharge plan. Per patient, she usually goes to [facility] for OPPT. Patient has a rolling walker, 3N1, shower bench, and elevated toilets at home so there are no DME needs at this time. TOC signing off. ? ?Final next level of care: OP Rehab ?Barriers to Discharge: No Barriers Identified ? ?Patient Goals and CMS Choice ?Patient states their goals for this hospitalization and ongoing recovery are:: Go to OPPT at [facility] ?Choice offered to / list presented to : NA ? ?Discharge Plan and Services       ?DME Arranged: N/A ?DME Agency: NA ? ?Readmission Risk Interventions ?No flowsheet data found. ? ?

## 2021-06-01 NOTE — Progress Notes (Addendum)
Physical Therapy Treatment ?Patient Details ?Name: Melissa Leach ?MRN: [id number] ?DOB: 01-08-1951 ?Today's Date: 06/01/2021 ? ? ?History of Present Illness Pt is a 71 y.o. female s/p Rt total knee revision. PMH signifcant for hypothyroidism, HLD, depression, anxiety, pre-diabetes, OA, R TSA, L reverse total shoulder, and B THA. ? ?  ?PT Comments  ? ? Today's treatment session focused on ambulation with new PWB 50% status and stair training. Per EMR/imaging- "appears to be a small, minimally displaced perihardware fracture of the lateral femoral condyle." Pt ambulated ~35ft total with use of RW and crutches and education/demonstration for PWB status with increased use of UEs to assist. Pt requesting trial of stairs with use of crutches vs. RW as she reports she "does better" with them (personal crutches in room). Therapist provided cues for proper sequencing for stair negotiation with use of single railing and crutches while maintaining PWB on R LE, x1 posterior LOB on last step when descending requiring up to MIN A from therapist for correction and use of UE support on railing/crutch. Review of importance of having supervision for safety, pt will have assist form her sister and has hired home health aide to assist in mornings/evenings as well. PT reviewed LE HEP, pt demonstrated understanding with no reports of increased pain. Pt will benefit from continued skilled PT to for further gait and stair training to maximize safety prior to d/c.  ? ?   ?Recommendations for follow up therapy are one component of a multi-disciplinary discharge planning process, led by the attending physician.  Recommendations may be updated based on patient status, additional functional criteria and insurance authorization. ? ?Follow Up Recommendations ? Follow physician's recommendations for discharge plan and follow up therapies ?  ?  ?Assistance Recommended at Discharge Frequent or constant Supervision/Assistance  ?Patient can return home  with the following A little help with walking and/or transfers;A little help with bathing/dressing/bathroom;Assistance with cooking/housework;Assist for transportation;Help with stairs or ramp for entrance ?  ?Equipment Recommendations ? None recommended by PT (pt owns RW)  ?  ?Recommendations for Other Services   ? ? ?  ?Precautions / Restrictions Precautions ?Precautions: Fall ?Restrictions ?Weight Bearing Restrictions: Yes ?RLE Weight Bearing: Partial weight bearing ?RLE Partial Weight Bearing Percentage or Pounds: 50%  ?  ? ?Mobility ? Bed Mobility ?Overal bed mobility: Needs Assistance ?Bed Mobility: Supine to Sit ?  ?  ?Supine to sit: Supervision, HOB elevated ?  ?  ?  ?  ? ?Transfers ?Overall transfer level: Needs assistance ?Equipment used: Rolling walker (2 wheels) ?Transfers: Sit to/from Stand ?Sit to Stand: Min guard ?  ?  ?  ?  ?  ?General transfer comment: x2; MIN guard for safety ?  ? ?Ambulation/Gait ?Ambulation/Gait assistance: Min guard ?Gait Distance (Feet): 70 Feet ?Assistive device: Rolling walker (2 wheels) ?Gait Pattern/deviations: Step-to pattern, Decreased stride length, Decreased weight shift to right, Antalgic ?Gait velocity: decr ?  ?  ?General Gait Details: education on PWB status and use of UEs to asisst. Trialed ambulation with use of RW and use of cructhes as pt reports increased comfort with cructhes since she used prior to admission. Initially unsteady with use of cructhes, improved with further distance. ? ? ?Stairs ?Stairs: Yes ?Stairs assistance: Min assist ?Stair Management: One rail Right, Step to pattern, Forwards, With crutches ?Number of Stairs: 5 ?General stair comments: Cues for proper sequencing and use of bil crutches on L side and hand rail on R, pt reports she has R support on steps at  home. Cues for use of B UEs to assist with maintain PWB precautions for stair negotiation. Pt with X1 posterior LOB on last step when descending requiring MIN A from therapist to  correct. ? ? ?Wheelchair Mobility ?  ? ?Modified Rankin (Stroke Patients Only) ?  ? ? ?  ?Balance Overall balance assessment: Needs assistance ?Sitting-balance support: Feet supported ?Sitting balance-Leahy Scale: Good ?  ?  ?Standing balance support: Bilateral upper extremity supported, During functional activity, Reliant on assistive device for balance ?Standing balance-Leahy Scale: Poor ?  ?  ?  ?  ?  ?  ?  ?  ?  ?  ?  ?  ?  ? ?  ?Cognition Arousal/Alertness: Awake/alert ?Behavior During Therapy: [facility] for tasks assessed/performed ?Overall Cognitive Status: Within Functional Limits for tasks assessed ?  ?  ?  ?  ?  ?  ?  ?  ?  ?  ?  ?  ?  ?  ?  ?  ?  ?  ?  ? ?  ?Exercises Total Joint Exercises ?Ankle Circles/Pumps: AROM, Both, 20 reps, Seated ?Quad Sets: AROM, Right, 10 reps, Seated ?Hip ABduction/ADduction: AROM, Right, 10 reps, Seated ?Long Arc Quad: AROM, Right, 10 reps, Seated ? ?  ?General Comments   ?  ?  ? ?Pertinent Vitals/Pain Pain Assessment ?Pain Assessment: 0-10 ?Pain Score: 5  ?Pain Location: Rt knee ?Pain Descriptors / Indicators: Sore, Discomfort ?Pain Intervention(s): Limited activity within patient's tolerance, Monitored during session, Repositioned  ? ? ?Home Living   ?  ?  ?  ?  ?  ?  ?  ?  ?  ?   ?  ?Prior Function    ?  ?  ?   ? ?PT Goals (current goals can now be found in the care plan section) Acute Rehab PT Goals ?Patient Stated Goal: get back home and have good recovery/less pain ?PT Goal Formulation: With patient ?Time For Goal Achievement: 06/14/21 ?Potential to Achieve Goals: Good ?Progress towards PT goals: Progressing toward goals ? ?  ?Frequency ? ? ? 7X/week ? ? ? ?  ?PT Plan Current plan remains appropriate  ? ? ?Co-evaluation   ?  ?  ?  ?  ? ?  ?AM-PAC PT "6 Clicks" Mobility   ?Outcome Measure ? Help needed turning from your back to your side while in a flat bed without using bedrails?: None ?Help needed moving from lying on your back to sitting on the side of a flat bed without  using bedrails?: A Little ?Help needed moving to and from a bed to a chair (including a wheelchair)?: A Little ?Help needed standing up from a chair using your arms (e.g., wheelchair or bedside chair)?: A Little ?Help needed to walk in hospital room?: A Little ?Help needed climbing 3-5 steps with a railing? : A Little ?6 Click Score: 19 ? ?  ?End of Session Equipment Utilized During Treatment: Gait belt ?Activity Tolerance: Patient tolerated treatment well ?Patient left: in chair;with call bell/phone within reach ?Nurse Communication: Mobility status ?PT Visit Diagnosis: Unsteadiness on feet (R26.81);Muscle weakness (generalized) (M62.81);Pain ?Pain - Right/Left: Right ?Pain - part of body: Knee ?  ? ? ?Time: [id number] ?PT Time Calculation (min) (ACUTE ONLY): 36 min ? ?Charges:  $Gait Training: 8-22 mins ?$Therapeutic Exercise: 8-22 mins          ?          ? ?Festus Barren., PT, DPT  ?Acute Rehabilitation Services  ?Office [phone] ? ?  06/01/2021, 1:38 PM ? ?

## 2021-06-02 LAB — CBC
HCT: 34.6 % — ABNORMAL LOW (ref 36.0–46.0)
Hemoglobin: 10.8 g/dL — ABNORMAL LOW (ref 12.0–15.0)
MCH: 30.9 pg (ref 26.0–34.0)
MCHC: 31.2 g/dL (ref 30.0–36.0)
MCV: 98.9 fL (ref 80.0–100.0)
Platelets: 219 10*3/uL (ref 150–400)
RBC: 3.5 MIL/uL — ABNORMAL LOW (ref 3.87–5.11)
RDW: 14.6 % (ref 11.5–15.5)
WBC: 8.2 10*3/uL (ref 4.0–10.5)
nRBC: 0 % (ref 0.0–0.2)

## 2021-06-02 LAB — GLUCOSE, CAPILLARY: Glucose-Capillary: 92 mg/dL (ref 70–99)

## 2021-06-02 MED ORDER — GABAPENTIN 100 MG PO CAPS
200.0000 mg | ORAL_CAPSULE | Freq: Two times a day (BID) | ORAL | Status: DC
Start: 2021-06-02 — End: 2021-06-02
  Administered 2021-06-02: 200 mg via ORAL
  Filled 2021-06-02: qty 2

## 2021-06-02 MED ORDER — GABAPENTIN 100 MG PO CAPS: 100.0000 mg | ORAL_CAPSULE | Freq: Three times a day (TID) | ORAL | 0 refills | Status: DC

## 2021-06-02 NOTE — Progress Notes (Signed)
Physical Therapy Treatment ?Patient Details ?Name: Melissa Leach ?MRN: [id number] ?DOB: 05-23-1950 ?Today's Date: 06/02/2021 ? ? ?History of Present Illness Pt is a 71 y.o. female s/p Rt total knee revision. PMH signifcant for hypothyroidism, HLD, depression, anxiety, pre-diabetes, OA, R TSA, L reverse total shoulder, and B THA. ? ?  ?PT Comments  ? ? Pt progressing well today, reviewed knee positioning, importance of terminal knee extension and pt verbalizes understanding. Pt is meeting goals and is ready to d/c with family assist as needed from PT standpoint.  ?Recommendations for follow up therapy are one component of a multi-disciplinary discharge planning process, led by the attending physician.  Recommendations may be updated based on patient status, additional functional criteria and insurance authorization. ? ?Follow Up Recommendations ? Follow physician's recommendations for discharge plan and follow up therapies ?  ?  ?Assistance Recommended at Discharge Intermittent Supervision/Assistance  ?Patient can return home with the following Assistance with cooking/housework;Assist for transportation;Help with stairs or ramp for entrance ?  ?Equipment Recommendations ? None recommended by PT  ?  ?Recommendations for Other Services   ? ? ?  ?Precautions / Restrictions Precautions ?Precautions: Fall;Knee ?Precaution Comments: reviewed positioning RLE, ways to avoid R hip external rotation and knee flexion, importance of terminal knee extension ?Restrictions ?RLE Weight Bearing: Partial weight bearing ?RLE Partial Weight Bearing Percentage or Pounds: 50%  ?  ? ?Mobility ? Bed Mobility ?Overal bed mobility: Needs Assistance ?Bed Mobility: Supine to Sit, Sit to Supine ?  ?  ?Supine to sit: Supervision, Min guard ?Sit to supine: Supervision ?  ?General bed mobility comments: initial assist to lower RLE, pt then able to self assist RLE with LLE ?  ? ?Transfers ?Overall transfer level: Needs assistance ?Equipment used:  Rolling walker (2 wheels) ?Transfers: Sit to/from Stand ?Sit to Stand: Min guard, Supervision ?  ?  ?  ?  ?  ?General transfer comment: cues for hand placement ?  ? ?Ambulation/Gait ?Ambulation/Gait assistance: Supervision ?Gait Distance (Feet): 40 Feet ?Assistive device: Rolling walker (2 wheels) ?Gait Pattern/deviations: Step-to pattern, Knee flexed in stance - right ?  ?  ?  ?General Gait Details: verbal cues for sequence, RW position and PWB ? ? ?Stairs ?Stairs: Yes ?Stairs assistance: Min assist, Min guard ?Stair Management: No rails, Step to pattern, Backwards, With walker ?Number of Stairs: 2 ?General stair comments: multi-modal cues for sequence, technique, and PWB. repeated x2 for instruction, pt able to self cue correct technique on second trial ? ? ?Wheelchair Mobility ?  ? ?Modified Rankin (Stroke Patients Only) ?  ? ? ?  ?Balance Overall balance assessment: History of Falls (pt reports she falls "a lot") ?  ?  ?  ?  ?  ?  ?  ?  ?  ?  ?  ?  ?  ?  ?  ?  ?  ?  ?  ? ?  ?Cognition Arousal/Alertness: Awake/alert ?Behavior During Therapy: [facility] for tasks assessed/performed ?Overall Cognitive Status: Within Functional Limits for tasks assessed ?  ?  ?  ?  ?  ?  ?  ?  ?  ?  ?  ?  ?  ?  ?  ?  ?  ?  ?  ? ?  ?Exercises Total Joint Exercises ?Ankle Circles/Pumps: AROM, Both, 5 reps ?Quad Sets: AROM, Right, 5 reps ? ?  ?General Comments   ?  ?  ? ?Pertinent Vitals/Pain Pain Assessment ?Pain Assessment: 0-10 ?Pain Score: 5  ?Pain Location: Rt  knee ?Pain Descriptors / Indicators: Sore, Discomfort ?Pain Intervention(s): Limited activity within patient's tolerance, Monitored during session, Premedicated before session, Repositioned, Ice applied  ? ? ?Home Living   ?  ?  ?  ?  ?  ?  ?  ?  ?  ?   ?  ?Prior Function    ?  ?  ?   ? ?PT Goals (current goals can now be found in the care plan section) Acute Rehab PT Goals ?Patient Stated Goal: get back home and have good recovery/less pain ?PT Goal Formulation: With patient ?Time  For Goal Achievement: 06/14/21 ?Potential to Achieve Goals: Good ?Progress towards PT goals: Progressing toward goals ? ?  ?Frequency ? ? ? 7X/week ? ? ? ?  ?PT Plan Current plan remains appropriate  ? ? ?Co-evaluation   ?  ?  ?  ?  ? ?  ?AM-PAC PT "6 Clicks" Mobility   ?Outcome Measure ? Help needed turning from your back to your side while in a flat bed without using bedrails?: None ?Help needed moving from lying on your back to sitting on the side of a flat bed without using bedrails?: A Little ?Help needed moving to and from a bed to a chair (including a wheelchair)?: A Little ?Help needed standing up from a chair using your arms (e.g., wheelchair or bedside chair)?: A Little ?Help needed to walk in hospital room?: A Little ?Help needed climbing 3-5 steps with a railing? : A Little ?6 Click Score: 19 ? ?  ?End of Session Equipment Utilized During Treatment: Gait belt ?Activity Tolerance: Patient tolerated treatment well ?Patient left: in bed;with call bell/phone within reach;with bed alarm set ?Nurse Communication: Mobility status ?PT Visit Diagnosis: Unsteadiness on feet (R26.81);Muscle weakness (generalized) (M62.81);Pain ?Pain - Right/Left: Right ?Pain - part of body: Knee ?  ? ? ?Time: 1610-9604 ?PT Time Calculation (min) (ACUTE ONLY): 31 min ? ?Charges:  $Gait Training: 23-37 mins          ?          ? Delice Bison, PT ? ?Acute Rehab Dept [facility]) [phone] ?Pager [phone] ? ?06/02/2021 ? ? ? ?Jeffree Cazeau ?06/02/2021, 10:30 AM ? ?

## 2021-06-02 NOTE — Plan of Care (Signed)

## 2021-06-02 NOTE — Discharge Summary (Signed)
Patient ID: Melissa Leach MRN: [id number] DOB/AGE: [birth year] 71 y.o.  Admit date: 05/31/2021 Discharge date: 06/02/2021  Primary Diagnosis: Right total knee arthroplasty broken femoral component Admission Diagnoses: Status post right knee arthroplasty revision Past Medical History:  Diagnosis Date   Anxiety    Arthritis    SEVERE PAIN RIGHT HIP   Blood in urine    "ALWAYS"  --AND STATES ALWAYS TREATED FOR UTI--BUT HAS NEVER HAD UTI.   Cellulitis    Chronic back pain    Complication of anesthesia    "SCRATCHED CORNEA"  WAKING UP FROM  KNEE SURGERY--'82   Depression    PT'S HUSBAND DIED OF CANCER AT [facility] 02-29-12   Gastric ulcer    Gastritis    HISTORY OF GASTRITIS--OCCAS ABDOMINAL PAIN    GERD (gastroesophageal reflux disease)    takes Omeprazole daily and states only bc she takes Mobic   H/O hiatal hernia    Headache(784.0)    MIGRAINE- INFREQUENT   History of migraine    takes Topamax daily and Imitrex prn   History of shingles    Hyperlipidemia    takes Lipitor daily   Hypothyroidism 05/13/2012   Insomnia    takes Xanax and Ativan prn   Joint pain    Joint swelling    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   Weight as of this encounter: 84.4 kg.  Procedure:  Procedure(s) (LRB): TOTAL KNEE REVISION/FEMORAL COMPONENT (Right)   Consults: None  HPI: Melissa Leach, 71 y.o. female, has a history of multiple right knee surgeries. She had an index total knee arthroplasty many years ago, with subsequent single component tibial revision in 2013. She has done well since then, until she began having some pain over the past 6 months. A  few weeks ago, she had significant increase in pain as well as mechanical symptoms, and presented to our clinic. X-rays in clinic revealed a broken femoral component. We discussed this diagnosis and need for revision, which she agreed with.  Patient presented to the operating room on 05/31/2021 for revision.  She was subsequently admitted for pain control and monitoring. Laboratory Data: Admission on 05/31/2021  Component Date Value Ref Range Status   Glucose-Capillary 05/31/2021 91  70 - 99 mg/dL Final   Glucose reference range applies only to samples taken after fasting for at least 8 hours.   WBC 06/01/2021 8.7  4.0 - 10.5 K/uL Final   RBC 06/01/2021 3.47 (L)  3.87 - 5.11 MIL/uL Final   Hemoglobin 06/01/2021 10.7 (L)  12.0 - 15.0 g/dL Final   HCT 65/78/4696 34.3 (L)  36.0 - 46.0 % Final   MCV 06/01/2021 98.8  80.0 - 100.0 fL Final   MCH 06/01/2021 30.8  26.0 - 34.0 pg Final   MCHC 06/01/2021 31.2  30.0 - 36.0 g/dL Final   RDW 29/52/8413 14.4  11.5 - 15.5 % Final   Platelets 06/01/2021 204  150 - 400 K/uL Final   nRBC 06/01/2021 0.0  0.0 - 0.2 % Final   Performed at [facility], [address]  [place], [place] [address]   Sodium 06/01/2021 135  135 - 145 mmol/L Final   Potassium 06/01/2021 3.8  3.5 - 5.1 mmol/L Final   Chloride 06/01/2021 103  98 - 111 mmol/L Final   CO2 06/01/2021 23  22 - 32 mmol/L Final   Glucose, Bld 06/01/2021 116 (H)  70 - 99 mg/dL Final   Glucose reference range applies only to samples taken after fasting for at least 8 hours.   BUN 06/01/2021 15  8 - 23 mg/dL Final   Creatinine, Ser 06/01/2021 0.83  0.44 - 1.00 mg/dL Final   Calcium 98/01/9146 8.5 (L)  8.9 - 10.3 mg/dL Final   GFR, Estimated 06/01/2021 >60  >60 mL/min Final   Comment: (NOTE) Calculated using the CKD-EPI Creatinine Equation (2021)    Anion gap 06/01/2021 9  5 - 15 Final   Performed at [facility], [address] [place], [place] [address]    Glucose-Capillary 05/31/2021 122 (H)  70 - 99 mg/dL Final   Glucose reference range applies only to samples taken after fasting for at least 8 hours.   Glucose-Capillary 06/01/2021 99  70 - 99 mg/dL Final   Glucose reference range applies only to samples taken after fasting for at least 8 hours.   Glucose-Capillary 06/01/2021 119 (H)  70 - 99 mg/dL Final   Glucose reference range applies only to samples taken after fasting for at least 8 hours.   WBC 06/02/2021 8.2  4.0 - 10.5 K/uL Final   RBC 06/02/2021 3.50 (L)  3.87 - 5.11 MIL/uL Final   Hemoglobin 06/02/2021 10.8 (L)  12.0 - 15.0 g/dL Final   HCT 21/30/8657 34.6 (L)  36.0 - 46.0 % Final   MCV 06/02/2021 98.9  80.0 - 100.0 fL Final   MCH 06/02/2021 30.9  26.0 - 34.0 pg Final   MCHC 06/02/2021 31.2  30.0 - 36.0 g/dL Final   RDW 84/69/6295 14.6  11.5 - 15.5 % Final   Platelets 06/02/2021 219  150 - 400 K/uL Final   nRBC 06/02/2021 0.0  0.0 - 0.2 % Final   Performed at [facility], [address] [place], [place] [address]   Glucose-Capillary 06/01/2021 160 (H)  70 - 99 mg/dL Final   Glucose reference range applies only to samples taken after fasting for at least 8 hours.   Glucose-Capillary 06/01/2021 143 (H)  70 - 99 mg/dL Final   Glucose reference range applies only to samples taken after fasting for at least 8 hours.   Glucose-Capillary 06/02/2021 92  70 - 99 mg/dL Final   Glucose reference range applies only to samples taken after fasting for at least 8 hours.  Hospital Outpatient Visit on 05/29/2021  Component Date Value Ref Range Status   SARS Coronavirus 2 05/29/2021 NEGATIVE  NEGATIVE Final   Comment: (NOTE) SARS-CoV-2 target nucleic acids are NOT DETECTED.  The SARS-CoV-2 RNA is generally detectable in upper and lower respiratory specimens during the acute phase of infection. Negative results do not preclude SARS-CoV-2 infection, do not rule out co-infections with other pathogens, and should not be used  as the sole basis for treatment or other patient management decisions. Negative results must be combined with clinical observations, patient history, and epidemiological information. The expected result is Negative.  Fact Sheet for Patients: HairSlick.no  Fact Sheet for Healthcare Providers: quierodirigir.com  This test is not yet approved or cleared by the [place] FDA and  has been authorized for detection and/or diagnosis  of SARS-CoV-2 by FDA under an Emergency Use Authorization (EUA). This EUA will remain  in effect (meaning this test can be used) for the duration of the COVID-19 declaration under Se                          ction 564(b)(1) of the Act, 21 U.S.C. section 360bbb-3(b)(1), unless the authorization is terminated or revoked sooner.  Performed at [facility], [address] [place], [place] [address]    Glucose-Capillary 05/29/2021 97  70 - 99 mg/dL Final   Glucose reference range applies only to samples taken after fasting for at least 8 hours.  Hospital Outpatient Visit on 05/29/2021  Component Date Value Ref Range Status   WBC 05/29/2021 6.6  4.0 - 10.5 K/uL Final   RBC 05/29/2021 3.99  3.87 - 5.11 MIL/uL Final   Hemoglobin 05/29/2021 12.4  12.0 - 15.0 g/dL Final   HCT 16/60/6301 39.0  36.0 - 46.0 % Final   MCV 05/29/2021 97.7  80.0 - 100.0 fL Final   MCH 05/29/2021 31.1  26.0 - 34.0 pg Final   MCHC 05/29/2021 31.8  30.0 - 36.0 g/dL Final   RDW 60/12/9321 14.4  11.5 - 15.5 % Final   Platelets 05/29/2021 261  150 - 400 K/uL Final   nRBC 05/29/2021 0.0  0.0 - 0.2 % Final   Performed at [facility], [address], [place], [place] [address]   Hgb A1c MFr Bld 05/29/2021 5.2  4.8 - 5.6 % Final   Comment: (NOTE) Pre diabetes:          5.7%-6.4%  Diabetes:              >6.4%  Glycemic control for   <7.0% adults with diabetes    Mean Plasma Glucose 05/29/2021 102.54  mg/dL  Final   Performed at [facility], [address], [place], [place] [address]   MRSA, PCR 05/29/2021 NEGATIVE  NEGATIVE Final   Staphylococcus aureus 05/29/2021 NEGATIVE  NEGATIVE Final   Comment: (NOTE) The Xpert SA Assay (FDA approved for NASAL specimens in patients 19 years of age and older), is one component of a comprehensive surveillance program. It is not intended to diagnose infection nor to guide or monitor treatment. Performed at [facility], [address], [place], [place] [address]    Sodium 05/29/2021 134 (L)  135 - 145 mmol/L Final   Potassium 05/29/2021 4.2  3.5 - 5.1 mmol/L Final   Chloride 05/29/2021 100  98 - 111 mmol/L Final   CO2 05/29/2021 27  22 - 32 mmol/L Final   Glucose, Bld 05/29/2021 95  70 - 99 mg/dL Final   Glucose reference range applies only to samples taken after fasting for at least 8 hours.   BUN 05/29/2021 23  8 - 23 mg/dL Final   Creatinine, Ser 05/29/2021 1.30 (H)  0.44 - 1.00 mg/dL Final   Calcium 37/62/8315 9.2  8.9 - 10.3 mg/dL Final   Total Protein 17/61/6073 7.1  6.5 - 8.1 g/dL Final   Albumin 71/08/2692 4.1  3.5 - 5.0 g/dL Final   AST 85/46/2703 26  15 - 41 U/L Final   ALT 05/29/2021 21  0 - 44 U/L Final   Alkaline Phosphatase 05/29/2021 83  38 - 126 U/L Final   Total Bilirubin 05/29/2021 0.6  0.3 - 1.2 mg/dL Final   GFR, Estimated 05/29/2021 45 (L)  >60 mL/min  Final   Comment: (NOTE) Calculated using the CKD-EPI Creatinine Equation (2021)    Anion gap 05/29/2021 7  5 - 15 Final   Performed at [facility], [address], [place], [place] [address]   ABO/RH(D) 05/29/2021 O NEG   Final   Antibody Screen 05/29/2021 NEG   Final   Sample Expiration 05/29/2021 06/03/2021,2359   Final   Extend sample reason 05/29/2021    Final                   Value:NO TRANSFUSIONS OR PREGNANCY IN THE PAST 3 MONTHS Performed at [facility], [address], [place], [place] [address]       X-Rays:DG Knee Right Port  Result Date: 06/01/2021 CLINICAL DATA:  Postoperative, right knee replacement EXAM: PORTABLE RIGHT KNEE - 1-2 VIEW COMPARISON:  None. FINDINGS: Status post right knee hinged total arthroplasty. There appears to be a small, minimally displaced perihardware fracture of the lateral femoral condyle about the arthroplasty component seen on frontal view. Expected overlying postoperative change. IMPRESSION: 1.  Status post right knee hinged total arthroplasty. 2. There appears to be a small, minimally displaced perihardware fracture of the lateral femoral condyle about the arthroplasty component, seen on frontal view. Electronically Signed   By: Jearld Lesch M.D.   On: 06/01/2021 09:51   VAS [place] LOWER EXTREMITY VENOUS (DVT)  Result Date: 05/17/2021  Lower Venous DVT Study Patient Name:  ENEDELIA MARTORELLI [facility]  Date of Exam:   05/17/2021 Medical Rec #: [id number]         Accession #:    [id number] Date of Birth: 1951-01-03         Patient Gender: F Patient Age:   55 years Exam Location:  [facility] Procedure:      VAS [place] LOWER EXTREMITY VENOUS (DVT) Referring Phys: Durene Romans --------------------------------------------------------------------------------  Indications: Pain, and Swelling of the right leg.  Risk Factors: Surgery Multiple right knee revisions. Performing Technologist: Dorthula Matas RVS, RCS  Examination Guidelines: A complete evaluation includes B-mode imaging, spectral Doppler, color Doppler, and power Doppler as needed of all accessible portions of each vessel. Bilateral testing is considered an integral part of a complete examination. Limited examinations for reoccurring indications may be performed as noted. The reflux portion of the exam is performed with the patient in reverse Trendelenburg.  +---------+---------------+---------+-----------+----------+--------------+  RIGHT     Compressibility Phasicity Spontaneity Properties Thrombus Aging   +---------+---------------+---------+-----------+----------+--------------+  CFV       Full                                                             +---------+---------------+---------+-----------+----------+--------------+  SFJ       Full                                                             +---------+---------------+---------+-----------+----------+--------------+  FV Prox   Full                                                             +---------+---------------+---------+-----------+----------+--------------+  FV Mid    Full                                                             +---------+---------------+---------+-----------+----------+--------------+  FV Distal Full                                                             +---------+---------------+---------+-----------+----------+--------------+  POP       Full                                                             +---------+---------------+---------+-----------+----------+--------------+  PTV       Full                                                             +---------+---------------+---------+-----------+----------+--------------+  PERO      Full                                                             +---------+---------------+---------+-----------+----------+--------------+  GSV       Full                                                             +---------+---------------+---------+-----------+----------+--------------+  SSV       Full                                                             +---------+---------------+---------+-----------+----------+--------------+    Findings reported to Courtney at 8:26 am.  Summary: RIGHT: - There is no evidence of deep vein thrombosis in the lower extremity. - There is no evidence of superficial venous thrombosis.  - No cystic structure found in the popliteal fossa.  LEFT: - No evidence of common femoral vein obstruction.  *See table(s) above for measurements and  observations. Electronically signed by Coral ElseVance Brabham MD on 05/17/2021 at 10:40:52 PM.    Final     EKG: Orders placed or performed during the hospital encounter of 05/29/21   EKG 12 lead per protocol   EKG 12 lead per protocol     Hospital Course: Harriet MassonCynthia Kay Patnode is a  71 y.o. who was admitted to Hospital. They were brought to the operating room on 05/31/2021 and underwent Procedure(s): TOTAL KNEE REVISION/FEMORAL COMPONENT.  Patient tolerated the procedure well and was later transferred to the recovery room and then to the orthopaedic floor for postoperative care.  They were given PO and IV analgesics for pain control following their surgery.  They were given 24 hours of postoperative antibiotics of  Anti-infectives (From admission, onward)    Start     Dose/Rate Route Frequency Ordered Stop   06/01/21 2200  cefadroxil (DURICEF) capsule 500 mg        500 mg Oral 2 times daily 06/01/21 1501 06/08/21 2159   06/01/21 1000  doxycycline (VIBRAMYCIN) 50 MG capsule 50 mg  Status:  Discontinued        50 mg Oral Daily 05/31/21 1633 06/01/21 0024   06/01/21 0800  doxycycline (VIBRAMYCIN) 25 MG/5ML suspension 50 mg        50 mg Oral Daily 06/01/21 0024     06/01/21 0000  cefadroxil (DURICEF) 500 MG capsule        500 mg Oral 2 times daily 06/01/21 1504 06/08/21 2359   05/31/21 1730  ceFAZolin (ANCEF) IVPB 2g/100 mL premix        2 g 200 mL/hr over 30 Minutes Intravenous Every 6 hours 05/31/21 1633 05/31/21 2333   05/31/21 1000  ceFAZolin (ANCEF) IVPB 2g/100 mL premix        2 g 200 mL/hr over 30 Minutes Intravenous On call to O.R. 05/31/21 0827 05/31/21 1100      and started on DVT prophylaxis in the form of Aspirin.   PT and OT were ordered for total joint protocol.  Discharge planning consulted to help with postop disposition and equipment needs.  Patient had an uneventful night on the evening of surgery though with pain.  They started to get up OOB with therapy on day one.  Continued to work with  therapy into day two.the patient had progressed with therapy and meeting their goals.  Patient was seen in rounds and was ready to go home.  Patient had her pain medications adjusted and I also added gabapentin prior to discharge for burning pain.   Diet: Regular diet Activity:PWB 50% Follow-up:in 2 weeks Disposition - Home Discharged Condition: good   Discharge Instructions     Call MD / Call 911   Complete by: As directed    If you experience chest pain or shortness of breath, CALL 911 and be transported to the hospital emergency room.  If you develope a fever above 101 F, pus (white drainage) or increased drainage or redness at the wound, or calf pain, call your surgeon's office.   Call MD / Call 911   Complete by: As directed    If you experience chest pain or shortness of breath, CALL 911 and be transported to the hospital emergency room.  If you develope a fever above 101 F, pus (white drainage) or increased drainage or redness at the wound, or calf pain, call your surgeon's office.   Change dressing   Complete by: As directed    Maintain surgical dressing until follow up in the clinic. If the edges start to pull up, may reinforce with tape. If the dressing is no longer working, may remove and cover with gauze and tape, but must keep the area dry and clean.  Call with any questions or concerns.   Constipation Prevention   Complete by: As directed  Drink plenty of fluids.  Prune juice may be helpful.  You may use a stool softener, such as Colace (over the counter) 100 mg twice a day.  Use MiraLax (over the counter) for constipation as needed.   Constipation Prevention   Complete by: As directed    Drink plenty of fluids.  Prune juice may be helpful.  You may use a stool softener, such as Colace (over the counter) 100 mg twice a day.  Use MiraLax (over the counter) for constipation as needed.   Diet - low sodium heart healthy   Complete by: As directed    Diet - low sodium heart  healthy   Complete by: As directed    Increase activity slowly as tolerated   Complete by: As directed    Partial weight bearing with assist device as directed.   Increase activity slowly as tolerated   Complete by: As directed    Post-operative opioid taper instructions:   Complete by: As directed    POST-OPERATIVE OPIOID TAPER INSTRUCTIONS: It is important to wean off of your opioid medication as soon as possible. If you do not need pain medication after your surgery it is ok to stop day one. Opioids include: Codeine, Hydrocodone(Norco, Vicodin), Oxycodone(Percocet, oxycontin) and hydromorphone amongst others.  Long term and even short term use of opiods can cause: Increased pain response Dependence Constipation Depression Respiratory depression And more.  Withdrawal symptoms can include Flu like symptoms Nausea, vomiting And more Techniques to manage these symptoms Hydrate well Eat regular healthy meals Stay active Use relaxation techniques(deep breathing, meditating, yoga) Do Not substitute Alcohol to help with tapering If you have been on opioids for less than two weeks and do not have pain than it is ok to stop all together.  Plan to wean off of opioids This plan should start within one week post op of your joint replacement. Maintain the same interval or time between taking each dose and first decrease the dose.  Cut the total daily intake of opioids by one tablet each day Next start to increase the time between doses. The last dose that should be eliminated is the evening dose.      Post-operative opioid taper instructions:   Complete by: As directed    POST-OPERATIVE OPIOID TAPER INSTRUCTIONS: It is important to wean off of your opioid medication as soon as possible. If you do not need pain medication after your surgery it is ok to stop day one. Opioids include: Codeine, Hydrocodone(Norco, Vicodin), Oxycodone(Percocet, oxycontin) and hydromorphone amongst others.   Long term and even short term use of opiods can cause: Increased pain response Dependence Constipation Depression Respiratory depression And more.  Withdrawal symptoms can include Flu like symptoms Nausea, vomiting And more Techniques to manage these symptoms Hydrate well Eat regular healthy meals Stay active Use relaxation techniques(deep breathing, meditating, yoga) Do Not substitute Alcohol to help with tapering If you have been on opioids for less than two weeks and do not have pain than it is ok to stop all together.  Plan to wean off of opioids This plan should start within one week post op of your joint replacement. Maintain the same interval or time between taking each dose and first decrease the dose.  Cut the total daily intake of opioids by one tablet each day Next start to increase the time between doses. The last dose that should be eliminated is the evening dose.      TED hose   Complete by: As  directed    Use stockings (TED hose) for 2 weeks on both leg(s).  You may remove them at night for sleeping.      Allergies as of 06/02/2021       Reactions   Ceftriaxone Other (See Comments)   gallstones   Sulfa Antibiotics Rash        Medication List     STOP taking these medications    oxyCODONE 5 MG immediate release tablet Commonly known as: Oxy IR/ROXICODONE       TAKE these medications    aspirin 81 MG chewable tablet Chew 1 tablet (81 mg total) by mouth 2 (two) times daily for 28 days.   atorvastatin 40 MG tablet Commonly known as: LIPITOR Take 40 mg by mouth every evening.   buPROPion 200 MG 12 hr tablet Commonly known as: Wellbutrin SR Take 1 tablet (200 mg total) by mouth daily.   CALCIUM 600+D3 PO Take 1 tablet by mouth daily.   cefadroxil 500 MG capsule Commonly known as: DURICEF Take 1 capsule (500 mg total) by mouth 2 (two) times daily for 7 days.   cetirizine 10 MG tablet Commonly known as: ZYRTEC Take 10 mg by mouth  daily.   clobetasol ointment 0.05 % Commonly known as: TEMOVATE Apply 1 application topically 2 (two) times daily.   docusate sodium 100 MG capsule Commonly known as: COLACE Take 1 capsule (100 mg total) by mouth 2 (two) times daily.   doxycycline 50 MG capsule Commonly known as: VIBRAMYCIN Take 50 mg by mouth daily.   gabapentin 100 MG capsule Commonly known as: Neurontin Take 1 capsule (100 mg total) by mouth 3 (three) times daily for 30 doses.   hydrocortisone 2.5 % cream Apply 1 application topically daily as needed (irritation).   HYDROmorphone 2 MG tablet Commonly known as: DILAUDID Take 1-2 tablets (2-4 mg total) by mouth every 4 (four) hours as needed for severe pain.   levothyroxine 75 MCG tablet Commonly known as: SYNTHROID Take 75 mcg by mouth daily before breakfast.   meloxicam 15 MG tablet Commonly known as: MOBIC Take 15 mg by mouth daily.   metFORMIN 500 MG tablet Commonly known as: GLUCOPHAGE TAKE ONE TABLET DAILY WITH BREAKFAST.   methocarbamol 500 MG tablet Commonly known as: ROBAXIN Take 1 tablet (500 mg total) by mouth every 6 (six) hours as needed for muscle spasms. What changed:  when to take this reasons to take this   multivitamin with minerals Tabs tablet Take 1 tablet by mouth daily.   pantoprazole 40 MG tablet Commonly known as: PROTONIX Take 40 mg by mouth daily.   polyethylene glycol 17 g packet Commonly known as: MIRALAX / GLYCOLAX Take 17 g by mouth daily as needed for mild constipation.   PRESERVISION AREDS 2+MULTI VIT PO Take 1 capsule by mouth 2 (two) times daily.   sertraline 50 MG tablet Commonly known as: ZOLOFT Take 100 mg by mouth daily.   SUMAtriptan 100 MG tablet Commonly known as: IMITREX Take 100 mg by mouth every 2 (two) hours as needed for migraine.   topiramate 25 MG tablet Commonly known as: TOPAMAX TAKE 1 TABLET (25 MG TOTAL) BY MOUTH DAILY.   Vitamin D (Ergocalciferol) 1.25 MG (50000 UNIT) Caps  capsule Commonly known as: DRISDOL TAKE 1 CAPSULE BY MOUTH ONE TIME PER WEEK               Discharge Care Instructions  (From admission, onward)  Start     Ordered   06/01/21 0000  Change dressing       Comments: Maintain surgical dressing until follow up in the clinic. If the edges start to pull up, may reinforce with tape. If the dressing is no longer working, may remove and cover with gauze and tape, but must keep the area dry and clean.  Call with any questions or concerns.   06/01/21 1504            Follow-up Information     Durene Romans, MD. Schedule an appointment as soon as possible for a visit in 2 week(s).   Specialty: Orthopedic Surgery Contact information: [address] [place] [address] [phone]                 Signed: Dion Saucier PA-C Orthopaedic Surgery 06/02/2021, 9:29 AM

## 2021-06-03 ENCOUNTER — Other Ambulatory Visit (INDEPENDENT_AMBULATORY_CARE_PROVIDER_SITE_OTHER): Payer: Self-pay | Admitting: Family Medicine

## 2021-06-03 DIAGNOSIS — F3289 Other specified depressive episodes: Secondary | ICD-10-CM

## 2021-06-03 DIAGNOSIS — E559 Vitamin D deficiency, unspecified: Secondary | ICD-10-CM

## 2021-06-04 ENCOUNTER — Other Ambulatory Visit (INDEPENDENT_AMBULATORY_CARE_PROVIDER_SITE_OTHER): Payer: Self-pay | Admitting: Family Medicine

## 2021-06-04 ENCOUNTER — Encounter (INDEPENDENT_AMBULATORY_CARE_PROVIDER_SITE_OTHER): Payer: Self-pay | Admitting: Family Medicine

## 2021-06-04 ENCOUNTER — Encounter (HOSPITAL_COMMUNITY): Payer: Self-pay | Admitting: Orthopedic Surgery

## 2021-06-04 DIAGNOSIS — R7303 Prediabetes: Secondary | ICD-10-CM

## 2021-06-04 DIAGNOSIS — E559 Vitamin D deficiency, unspecified: Secondary | ICD-10-CM

## 2021-06-04 DIAGNOSIS — F3289 Other specified depressive episodes: Secondary | ICD-10-CM

## 2021-06-04 NOTE — Telephone Encounter (Signed)
Dr.Beasley 

## 2021-06-05 NOTE — Telephone Encounter (Signed)
LAST APPOINTMENT DATE: 04/12/21 ?NEXT APPOINTMENT DATE: 07/19/21 ? ? ?[id number] , [place] - [address] AT [address] [facility] [address] ?[address] ? [place] [address] ?Phone: [phone] Fax: [phone] ? ?Patient is requesting a refill of the following medications: ?Requested Prescriptions  ? ?Pending Prescriptions Disp Refills  ? buPROPion (WELLBUTRIN SR) 200 MG 12 hr tablet 30 tablet 0  ?  Sig: Take 1 tablet (200 mg total) by mouth daily.  ? Vitamin D, Ergocalciferol, (DRISDOL) 1.25 MG (50000 UNIT) CAPS capsule 4 capsule 0  ?  Sig: TAKE 1 CAPSULE BY MOUTH ONE TIME PER WEEK  ? metFORMIN (GLUCOPHAGE) 500 MG tablet 30 tablet 0  ? topiramate (TOPAMAX) 25 MG tablet 30 tablet 0  ?  Sig: Take 1 tablet (25 mg total) by mouth daily.  ? ? ?Date last filled: 05/2021 and 04/2021 ?Previously prescribed by Dr. Dalbert Garnet ? ?Lab Results  ?Component Value Date  ? HGBA1C 5.2 05/29/2021  ? HGBA1C 5.4 01/11/2021  ? HGBA1C 5.3 05/29/2020  ? ?Lab Results  ?Component Value Date  ? LDLCALC 61 01/11/2021  ? CREATININE 0.83 06/01/2021  ? ?Lab Results  ?Component Value Date  ? VD25OH 65.3 01/11/2021  ? VD25OH 55.3 05/29/2020  ? VD25OH 60.7 11/01/2019  ? ? ?BP Readings from Last 3 Encounters:  ?06/02/21 (!) 142/81  ?05/29/21 107/61  ?05/10/21 122/80  ? ? ?

## 2021-06-05 NOTE — Telephone Encounter (Signed)
Please see message and advise.  Thank you. ?I have sent them to you.

## 2021-06-05 NOTE — Telephone Encounter (Signed)
Dr.Beasley 

## 2021-06-06 ENCOUNTER — Telehealth (INDEPENDENT_AMBULATORY_CARE_PROVIDER_SITE_OTHER): Payer: Medicare HMO | Admitting: Family Medicine

## 2021-06-06 ENCOUNTER — Encounter (INDEPENDENT_AMBULATORY_CARE_PROVIDER_SITE_OTHER): Payer: Self-pay | Admitting: Family Medicine

## 2021-06-06 DIAGNOSIS — E669 Obesity, unspecified: Secondary | ICD-10-CM | POA: Diagnosis not present

## 2021-06-06 DIAGNOSIS — E559 Vitamin D deficiency, unspecified: Secondary | ICD-10-CM | POA: Diagnosis not present

## 2021-06-06 DIAGNOSIS — F3289 Other specified depressive episodes: Secondary | ICD-10-CM | POA: Diagnosis not present

## 2021-06-06 DIAGNOSIS — Z6834 Body mass index (BMI) 34.0-34.9, adult: Secondary | ICD-10-CM | POA: Diagnosis not present

## 2021-06-06 DIAGNOSIS — R69 Illness, unspecified: Secondary | ICD-10-CM | POA: Diagnosis not present

## 2021-06-06 MED ORDER — VITAMIN D (ERGOCALCIFEROL) 1.25 MG (50000 UNIT) PO CAPS: ORAL_CAPSULE | ORAL | 0 refills | Status: DC

## 2021-06-06 MED ORDER — TOPIRAMATE 25 MG PO TABS: 25.0000 mg | ORAL_TABLET | Freq: Every day | ORAL | 0 refills | Status: DC

## 2021-06-06 MED ORDER — BUPROPION HCL ER (SR) 200 MG PO TB12: 200.0000 mg | ORAL_TABLET | Freq: Every day | ORAL | 0 refills | Status: DC

## 2021-06-06 NOTE — Progress Notes (Signed)
?TeleHealth Visit:  ?Due to the COVID-19 pandemic, this visit was completed with telemedicine (audio/video) technology to reduce patient and provider exposure as well as to preserve personal protective equipment.  ? ?Melissa Leach has verbally consented to this TeleHealth visit. The patient is located at home, the provider is located at home. The participants in this visit include the listed provider and patient. The visit was conducted today via MyChart video. ? ?OBESITY ?Melissa Leach is here to discuss her progress with her obesity treatment plan along with follow-up of her obesity related diagnoses.  ? ?Today's date: 06/06/2021 ?Today's visit was #: 64 ?Starting weight: 216 lbs ?Starting date: 04/08/19 ?Total lbs lost to date: 26 ?She does not have a weight to report today. ? ? ?Interim History: Melissa Leach had a right total knee revision on March 2.  She reports she stayed 2 nights in the hospital.  This is her fourth surgery on this knee.  She will start physical therapy next week. ?Her sister is helping her as well as a hired Building control surveyor..  She notes low appetite since surgery.  She feels her protein intake is pretty good. ? ?Nutrition Plan: the Category 2 Plan. - 60 %.adherence.  ?Hunger is well controlled. Cravings are well controlled.  ?Activity: None.  ? ? ?Assessment/Plan:  ?Vitamin D Deficiency ?Vitamin D is at goal of 50. She is on weekly prescription Vitamin D 50,000IU.  ?Lab Results  ?Component Value Date  ? VD25OH 65.3 01/11/2021  ? VD25OH 55.3 05/29/2020  ? VD25OH 60.7 11/01/2019  ? ? ?Plan: ?Continue prescription vitamin D 50,000 IU weekly.  ? ?2. Other depression with emotional eating ?Mood is stable.  Cravings well controlled.  She is on bupropion 200 mg daily and topiramate 25 mg daily.  ? ?3. Obesity: Current BMI 34.03 ?   ?Melissa Leach is currently in the action stage of change. As such, her goal is to continue with weight loss efforts. She has agreed to the Category 2 Plan.  ? ?Exercise goals: No exercise has  been prescribed at this time. ? ?Behavioral modification strategies: increasing lean protein intake and decreasing simple carbohydrates. ? ?Melissa Leach has agreed to follow-up with our clinic in 6 weeks.  ?Objective:  ? ?VITALS: Per patient if applicable, see vitals. ?GENERAL: Alert and in no acute distress. ?CARDIOPULMONARY: No increased WOB. Speaking in clear sentences.  ?PSYCH: Pleasant and cooperative. Speech normal rate and rhythm. Affect is appropriate. Insight and judgement are appropriate. Attention is focused, linear, and appropriate.  ?NEURO: Oriented as arrived to appointment on time with no prompting.  ? ?Lab Results  ?Component Value Date  ? CREATININE 0.83 06/01/2021  ? BUN 15 06/01/2021  ? NA 135 06/01/2021  ? K 3.8 06/01/2021  ? CL 103 06/01/2021  ? CO2 23 06/01/2021  ? ?Lab Results  ?Component Value Date  ? ALT 21 05/29/2021  ? AST 26 05/29/2021  ? ALKPHOS 83 05/29/2021  ? BILITOT 0.6 05/29/2021  ? ?Lab Results  ?Component Value Date  ? HGBA1C 5.2 05/29/2021  ? HGBA1C 5.4 01/11/2021  ? HGBA1C 5.3 05/29/2020  ? HGBA1C 5.4 03/14/2020  ? HGBA1C 5.5 11/01/2019  ? ?Lab Results  ?Component Value Date  ? INSULIN 9.1 01/11/2021  ? INSULIN 9.2 05/29/2020  ? INSULIN 8.3 11/01/2019  ? INSULIN 9.8 07/22/2019  ? INSULIN 12.1 04/08/2019  ? ?Lab Results  ?Component Value Date  ? TSH 2.240 01/11/2021  ? ?Lab Results  ?Component Value Date  ? CHOL 132 01/11/2021  ? HDL 54 01/11/2021  ?  [place] 61 01/11/2021  ? TRIG 91 01/11/2021  ? ?Lab Results  ?Component Value Date  ? WBC 8.2 06/02/2021  ? HGB 10.8 (L) 06/02/2021  ? HCT 34.6 (L) 06/02/2021  ? MCV 98.9 06/02/2021  ? PLT 219 06/02/2021  ? ?No results found for: IRON, TIBC, FERRITIN ?Lab Results  ?Component Value Date  ? VD25OH 65.3 01/11/2021  ? VD25OH 55.3 05/29/2020  ? VD25OH 60.7 11/01/2019  ? ? ?Attestation Statements:  ? ?Reviewed by clinician on day of visit: allergies, medications, problem list, medical history, surgical history, family history, social history,  and previous encounter notes. ? ? ?  ?

## 2021-06-12 DIAGNOSIS — M25561 Pain in right knee: Secondary | ICD-10-CM | POA: Diagnosis not present

## 2021-06-14 DIAGNOSIS — Z96651 Presence of right artificial knee joint: Secondary | ICD-10-CM | POA: Diagnosis not present

## 2021-06-14 DIAGNOSIS — M25561 Pain in right knee: Secondary | ICD-10-CM | POA: Diagnosis not present

## 2021-06-18 ENCOUNTER — Ambulatory Visit (INDEPENDENT_AMBULATORY_CARE_PROVIDER_SITE_OTHER): Payer: Medicare HMO | Admitting: Family Medicine

## 2021-06-18 DIAGNOSIS — M25561 Pain in right knee: Secondary | ICD-10-CM | POA: Diagnosis not present

## 2021-06-20 DIAGNOSIS — M25561 Pain in right knee: Secondary | ICD-10-CM | POA: Diagnosis not present

## 2021-06-22 ENCOUNTER — Other Ambulatory Visit (INDEPENDENT_AMBULATORY_CARE_PROVIDER_SITE_OTHER): Payer: Self-pay | Admitting: Family Medicine

## 2021-06-22 DIAGNOSIS — M25561 Pain in right knee: Secondary | ICD-10-CM | POA: Diagnosis not present

## 2021-06-22 DIAGNOSIS — R7303 Prediabetes: Secondary | ICD-10-CM

## 2021-06-25 ENCOUNTER — Other Ambulatory Visit (INDEPENDENT_AMBULATORY_CARE_PROVIDER_SITE_OTHER): Payer: Self-pay | Admitting: Family Medicine

## 2021-06-25 DIAGNOSIS — E559 Vitamin D deficiency, unspecified: Secondary | ICD-10-CM

## 2021-06-25 DIAGNOSIS — F3289 Other specified depressive episodes: Secondary | ICD-10-CM

## 2021-06-25 DIAGNOSIS — R7303 Prediabetes: Secondary | ICD-10-CM

## 2021-06-25 DIAGNOSIS — M25561 Pain in right knee: Secondary | ICD-10-CM | POA: Diagnosis not present

## 2021-06-25 MED ORDER — METFORMIN HCL 500 MG PO TABS: 500.0000 mg | ORAL_TABLET | Freq: Every day | ORAL | 0 refills | Status: DC

## 2021-06-25 MED ORDER — BUPROPION HCL ER (SR) 200 MG PO TB12: 200.0000 mg | ORAL_TABLET | Freq: Every day | ORAL | 0 refills | Status: DC

## 2021-06-25 MED ORDER — TOPIRAMATE 25 MG PO TABS: 25.0000 mg | ORAL_TABLET | Freq: Every day | ORAL | 0 refills | Status: DC

## 2021-06-25 MED ORDER — VITAMIN D (ERGOCALCIFEROL) 1.25 MG (50000 UNIT) PO CAPS: ORAL_CAPSULE | ORAL | 0 refills | Status: DC

## 2021-06-25 NOTE — Telephone Encounter (Signed)
LAST APPOINTMENT DATE: 06/06/21 ?NEXT APPOINTMENT DATE: 07/19/21 ? ? ?[id number] - [place], [place] - [address] AT [address] [facility] [address] ?[address] ?[place] [address] ?Phone: [phone] Fax: [phone] ? ?Patient is requesting a refill of the following medications: ?Requested Prescriptions  ? ?Pending Prescriptions Disp Refills  ? Vitamin D, Ergocalciferol, (DRISDOL) 1.25 MG (50000 UNIT) CAPS capsule 4 capsule 0  ?  Sig: TAKE 1 CAPSULE BY MOUTH ONE TIME PER WEEK  ? topiramate (TOPAMAX) 25 MG tablet 30 tablet 0  ?  Sig: Take 1 tablet (25 mg total) by mouth daily.  ? buPROPion (WELLBUTRIN SR) 200 MG 12 hr tablet 30 tablet 0  ?  Sig: Take 1 tablet (200 mg total) by mouth daily.  ? ? ?Date last filled: 06/06/21 ?Previously prescribed by Adah Salvage, FNP ? ?Lab Results  ?Component Value Date  ? HGBA1C 5.2 05/29/2021  ? HGBA1C 5.4 01/11/2021  ? HGBA1C 5.3 05/29/2020  ? ?Lab Results  ?Component Value Date  ? LDLCALC 61 01/11/2021  ? CREATININE 0.83 06/01/2021  ? ?Lab Results  ?Component Value Date  ? VD25OH 65.3 01/11/2021  ? VD25OH 55.3 05/29/2020  ? VD25OH 60.7 11/01/2019  ? ? ?BP Readings from Last 3 Encounters:  ?06/02/21 (!) 142/81  ?05/29/21 107/61  ?05/10/21 122/80  ? ? ?

## 2021-06-25 NOTE — Telephone Encounter (Signed)
LAST APPOINTMENT DATE: 06/06/21 ?NEXT APPOINTMENT DATE: 07/19/21 ? ? ?[id number] [place] [address] ?[place] ?[place] [address] ?Phone: [phone] Fax: [phone] ? ?Patient is requesting a refill of the following medications: ?Requested Prescriptions  ? ?Pending Prescriptions Disp Refills  ? metFORMIN (GLUCOPHAGE) 500 MG tablet 30 tablet 0  ? ? ?Date last filled: 05/07/21 ?Previously prescribed by Dr. Leafy Ro ? ?Lab Results  ?Component Value Date  ? HGBA1C 5.2 05/29/2021  ? HGBA1C 5.4 01/11/2021  ? HGBA1C 5.3 05/29/2020  ? ?Lab Results  ?Component Value Date  ? [place] 61 01/11/2021  ? CREATININE 0.83 06/01/2021  ? ?Lab Results  ?Component Value Date  ? VD25OH 65.3 01/11/2021  ? VD25OH 55.3 05/29/2020  ? VD25OH 60.7 11/01/2019  ? ? ?BP Readings from Last 3 Encounters:  ?06/02/21 (!) 142/81  ?05/29/21 107/61  ?05/10/21 122/80  ? ? ?

## 2021-06-27 DIAGNOSIS — M25561 Pain in right knee: Secondary | ICD-10-CM | POA: Diagnosis not present

## 2021-06-29 ENCOUNTER — Other Ambulatory Visit (INDEPENDENT_AMBULATORY_CARE_PROVIDER_SITE_OTHER): Payer: Self-pay | Admitting: Family Medicine

## 2021-06-29 DIAGNOSIS — M25561 Pain in right knee: Secondary | ICD-10-CM | POA: Diagnosis not present

## 2021-06-29 DIAGNOSIS — F3289 Other specified depressive episodes: Secondary | ICD-10-CM

## 2021-07-03 ENCOUNTER — Ambulatory Visit: Payer: Medicare HMO | Admitting: Neurology

## 2021-07-03 DIAGNOSIS — G43709 Chronic migraine without aura, not intractable, without status migrainosus: Secondary | ICD-10-CM

## 2021-07-03 DIAGNOSIS — M25561 Pain in right knee: Secondary | ICD-10-CM | POA: Diagnosis not present

## 2021-07-03 NOTE — Progress Notes (Signed)
Consent signed ?Botox- 200 units x 1 vial ?Lot: [id number] ?Expiration: 12/2023 ?NDC: [phone] ? ?Bacteriostatic 0.9% Sodium Chloride- 83mL total ?Lot: [id number] ?Expiration: 10/31/2022 ?NDC: 3664-4034-74 ? ?Dx: Q59.563 ?B/B ? ?

## 2021-07-03 NOTE — Progress Notes (Signed)
Consent Form ?Botulism Toxin Injection For Chronic Migraine ? ?04/10/2021: Doing great, >>70% decrease migraines.  ? ?Reviewed orally with patient, additionally signature is on file: ? ?Botulism toxin has been approved by the Federal drug administration for treatment of chronic migraine. Botulism toxin does not cure chronic migraine and it may not be effective in some patients. ? ?The administration of botulism toxin is accomplished by injecting a small amount of toxin into the muscles of the neck and head. Dosage must be titrated for each individual. Any benefits resulting from botulism toxin tend to wear off after 3 months with a repeat injection required if benefit is to be maintained. Injections are usually done every 3-4 months with maximum effect peak achieved by about 2 or 3 weeks. Botulism toxin is expensive and you should be sure of what costs you will incur resulting from the injection. ? ?The side effects of botulism toxin use for chronic migraine may include: ? ? -Transient, and usually mild, facial weakness with facial injections ? -Transient, and usually mild, head or neck weakness with head/neck injections ? -Reduction or loss of forehead facial animation due to forehead muscle weakness ? -Eyelid drooping ? -Dry eye ? -Pain at the site of injection or bruising at the site of injection ? -Double vision ? -Potential unknown long term risks ? ?Contraindications: You should not have Botox if you are pregnant, nursing, allergic to albumin, have an infection, skin condition, or muscle weakness at the site of the injection, or have myasthenia gravis, Lambert-Eaton syndrome, or ALS. ? ?It is also possible that as with any injection, there may be an allergic reaction or no effect from the medication. Reduced effectiveness after repeated injections is sometimes seen and rarely infection at the injection site may occur. All care will be taken to prevent these side effects. If therapy is given over a long time,  atrophy and wasting in the muscle injected may occur. Occasionally the patient's become refractory to treatment because they develop antibodies to the toxin. In this event, therapy needs to be modified. ? ?I have read the above information and consent to the administration of botulism toxin. ? ? ? ?BOTOX PROCEDURE NOTE FOR MIGRAINE HEADACHE ? ? ? ?Contraindications and precautions discussed with patient(above). Aseptic procedure was observed and patient tolerated procedure. Procedure performed by Dr. Georgia Dom ? ?The condition has existed for more than 6 months, and pt does not have a diagnosis of ALS, Myasthenia Gravis or Lambert-Eaton Syndrome.  Risks and benefits of injections discussed and pt agrees to proceed with the procedure.  Written consent obtained ? ?These injections are medically necessary. Pt  receives good benefits from these injections. These injections do not cause sedations or hallucinations which the oral therapies may cause. ? ?Description of procedure: ? ?The patient was placed in a sitting position. The standard protocol was used for Botox as follows, with 5 units of Botox injected at each site: ? ? ?-Procerus muscle, midline injection ? ?-Corrugator muscle, bilateral injection ? ?-Frontalis muscle, bilateral injection, with 2 sites each side, medial injection was performed in the upper one third of the frontalis muscle, in the region vertical from the medial inferior edge of the superior orbital rim. The lateral injection was again in the upper one third of the forehead vertically above the lateral limbus of the cornea, 1.5 cm lateral to the medial injection site. ? ?-Temporalis muscle injection, 4 sites, bilaterally. The first injection was 3 cm above the tragus of the ear, second injection  site was 1.5 cm to 3 cm up from the first injection site in line with the tragus of the ear. The third injection site was 1.5-3 cm forward between the first 2 injection sites. The fourth injection site  was 1.5 cm posterior to the second injection site.  ? ?-Occipitalis muscle injection, 3 sites, bilaterally. The first injection was done one half way between the occipital protuberance and the tip of the mastoid process behind the ear. The second injection site was done lateral and superior to the first, 1 fingerbreadth from the first injection. The third injection site was 1 fingerbreadth superiorly and medially from the first injection site. ? ?-Cervical paraspinal muscle injection, 2 sites, bilateral knee first injection site was 1 cm from the midline of the cervical spine, 3 cm inferior to the lower border of the occipital protuberance. The second injection site was 1.5 cm superiorly and laterally to the first injection site. ? ?-Trapezius muscle injection was performed at 3 sites, bilaterally. The first injection site was in the upper trapezius muscle halfway between the inflection point of the neck, and the acromion. The second injection site was one half way between the acromion and the first injection site. The third injection was done between the first injection site and the inflection point of the neck. ? ? ?Will return for repeat injection in 3 months. ? ? ?155 units of Botox was used, 45u Botox not injected was wasted. The patient tolerated the procedure well, there were no complications of the above procedure. ? ? ?

## 2021-07-06 DIAGNOSIS — M25561 Pain in right knee: Secondary | ICD-10-CM | POA: Diagnosis not present

## 2021-07-10 DIAGNOSIS — M25561 Pain in right knee: Secondary | ICD-10-CM | POA: Diagnosis not present

## 2021-07-12 ENCOUNTER — Other Ambulatory Visit (INDEPENDENT_AMBULATORY_CARE_PROVIDER_SITE_OTHER): Payer: Self-pay | Admitting: Family Medicine

## 2021-07-12 DIAGNOSIS — R7303 Prediabetes: Secondary | ICD-10-CM

## 2021-07-12 DIAGNOSIS — M25561 Pain in right knee: Secondary | ICD-10-CM | POA: Diagnosis not present

## 2021-07-17 DIAGNOSIS — M25561 Pain in right knee: Secondary | ICD-10-CM | POA: Diagnosis not present

## 2021-07-19 ENCOUNTER — Encounter (INDEPENDENT_AMBULATORY_CARE_PROVIDER_SITE_OTHER): Payer: Self-pay | Admitting: Family Medicine

## 2021-07-19 ENCOUNTER — Ambulatory Visit (INDEPENDENT_AMBULATORY_CARE_PROVIDER_SITE_OTHER): Payer: Medicare HMO | Admitting: Family Medicine

## 2021-07-19 VITALS — BP 109/75 | HR 90 | Temp 97.6°F | Ht 61.0 in | Wt 176.0 lb

## 2021-07-19 DIAGNOSIS — F3289 Other specified depressive episodes: Secondary | ICD-10-CM | POA: Diagnosis not present

## 2021-07-19 DIAGNOSIS — Z4789 Encounter for other orthopedic aftercare: Secondary | ICD-10-CM | POA: Diagnosis not present

## 2021-07-19 DIAGNOSIS — R69 Illness, unspecified: Secondary | ICD-10-CM | POA: Diagnosis not present

## 2021-07-19 DIAGNOSIS — R7303 Prediabetes: Secondary | ICD-10-CM

## 2021-07-19 DIAGNOSIS — Z471 Aftercare following joint replacement surgery: Secondary | ICD-10-CM | POA: Diagnosis not present

## 2021-07-19 DIAGNOSIS — F32A Depression, unspecified: Secondary | ICD-10-CM | POA: Insufficient documentation

## 2021-07-19 DIAGNOSIS — E559 Vitamin D deficiency, unspecified: Secondary | ICD-10-CM | POA: Diagnosis not present

## 2021-07-19 MED ORDER — VITAMIN D (ERGOCALCIFEROL) 1.25 MG (50000 UNIT) PO CAPS: ORAL_CAPSULE | ORAL | 0 refills | Status: DC

## 2021-07-19 MED ORDER — TOPIRAMATE 25 MG PO TABS: 25.0000 mg | ORAL_TABLET | Freq: Every day | ORAL | 0 refills | Status: DC

## 2021-07-19 MED ORDER — METFORMIN HCL 500 MG PO TABS: 500.0000 mg | ORAL_TABLET | Freq: Every day | ORAL | 0 refills | Status: DC

## 2021-07-19 MED ORDER — BUPROPION HCL ER (SR) 200 MG PO TB12: 200.0000 mg | ORAL_TABLET | Freq: Every day | ORAL | 0 refills | Status: DC

## 2021-07-27 ENCOUNTER — Other Ambulatory Visit (INDEPENDENT_AMBULATORY_CARE_PROVIDER_SITE_OTHER): Payer: Self-pay | Admitting: Family Medicine

## 2021-07-27 DIAGNOSIS — F3289 Other specified depressive episodes: Secondary | ICD-10-CM

## 2021-07-31 DIAGNOSIS — Z13820 Encounter for screening for osteoporosis: Secondary | ICD-10-CM | POA: Diagnosis not present

## 2021-07-31 DIAGNOSIS — Z1231 Encounter for screening mammogram for malignant neoplasm of breast: Secondary | ICD-10-CM | POA: Diagnosis not present

## 2021-08-01 NOTE — Progress Notes (Signed)
? ? ? ?Chief Complaint:  ? ?OBESITY ?Melissa Leach is here to discuss her progress with her obesity treatment plan along with follow-up of her obesity related diagnoses. Melissa Leach is on the Category 2 Plan and states she is following her eating plan approximately 50% of the time. Melissa Leach states she is walking for 20 minutes 7 times per week. ? ?Today's visit was #: 30 ?Starting weight: 216 lbs ?Starting date: 06/06/2021 ?Today's weight: 176 lbs ?Today's date: 07/19/2021 ?Total lbs lost to date: 35 ?Total lbs lost since last in-office visit: 4 ? ?Interim History: Melissa Leach has done well with weight loss. Her last visit was 3 months ago and she is recovering from knee replacement. She has been working on Network engineer.  ? ?Subjective:  ? ?1. Pre-diabetes ?Melissa Leach continues to work on her diet. She had some mildly elevated glucose while hospitalized and she is on steroid.  ? ?2. Vitamin D deficiency ?Melissa Leach is on Vitamin D, and her last level was not yet at goal. She is healing from knee replacement surgery.  ? ?3. Other depression with emotional eating ?Melissa Leach continues  to do well with decreasing emotional eating behaviors. No side effects were noted.  ? ?Assessment/Plan:  ? ?1. Pre-diabetes ?Melissa Leach Melissa Leach continue metformin and we Melissa Leach refill for 1 month. We Melissa Leach recheck labs in 2 months.  ? ?- metFORMIN (GLUCOPHAGE) 500 MG tablet; Take 1 tablet (500 mg total) by mouth daily with breakfast.  Dispense: 30 tablet; Refill: 0 ? ?2. Vitamin D deficiency ?Melissa Leach Melissa Leach continue her Vitamin D, and we Melissa Leach refill for 1 month. We Melissa Leach recheck labs in 2 months.  ? ?- Vitamin D, Ergocalciferol, (DRISDOL) 1.25 MG (50000 UNIT) CAPS capsule; TAKE 1 CAPSULE BY MOUTH ONE TIME PER WEEK  Dispense: 4 capsule; Refill: 0 ? ?3. Other depression with emotional eating ?We Melissa Leach refill Wellbutrin SR, and Topamax for 1 month. Behavior modification techniques were discussed today to help Melissa Leach deal with her  emotional/non-hunger eating behaviors.  Orders and follow up as documented in patient record.  ? ?- buPROPion (WELLBUTRIN SR) 200 MG 12 hr tablet; Take 1 tablet (200 mg total) by mouth daily.  Dispense: 30 tablet; Refill: 0 ?- topiramate (TOPAMAX) 25 MG tablet; Take 1 tablet (25 mg total) by mouth daily.  Dispense: 30 tablet; Refill: 0 ? ?4. Obesity, current BMI 33.3 ?Melissa Leach is currently in the action stage of change. As such, her goal is to continue with weight loss efforts. She has agreed to the Category 2 Plan.  ? ?Exercise goals: As is. ? ?Behavioral modification strategies: increasing lean protein intake. ? ?Melissa Leach has agreed to follow-up with our clinic in 4 weeks. She was informed of the importance of frequent follow-up visits to maximize her success with intensive lifestyle modifications for her multiple health conditions.  ? ?Objective:  ? ?Blood pressure 109/75, pulse 90, temperature 97.6 ?F (36.4 ?C), height 5\' 1"  (1.549 m), weight 176 lb (79.8 kg), last menstrual period 12/27/2002, SpO2 97 %. ?Body mass index is 33.25 kg/m?. ? ?General: Cooperative, alert, well developed, in no acute distress. ?HEENT: Conjunctivae and lids unremarkable. ?Cardiovascular: Regular rhythm.  ?Lungs: Normal work of breathing. ?Neurologic: No focal deficits.  ? ?Lab Results  ?Component Value Date  ? CREATININE 0.83 06/01/2021  ? BUN 15 06/01/2021  ? NA 135 06/01/2021  ? K 3.8 06/01/2021  ? CL 103 06/01/2021  ? CO2 23 06/01/2021  ? ?Lab Results  ?Component Value Date  ? ALT 21 05/29/2021  ?  AST 26 05/29/2021  ? ALKPHOS 83 05/29/2021  ? BILITOT 0.6 05/29/2021  ? ?Lab Results  ?Component Value Date  ? HGBA1C 5.2 05/29/2021  ? HGBA1C 5.4 01/11/2021  ? HGBA1C 5.3 05/29/2020  ? HGBA1C 5.4 03/14/2020  ? HGBA1C 5.5 11/01/2019  ? ?Lab Results  ?Component Value Date  ? INSULIN 9.1 01/11/2021  ? INSULIN 9.2 05/29/2020  ? INSULIN 8.3 11/01/2019  ? INSULIN 9.8 07/22/2019  ? INSULIN 12.1 04/08/2019  ? ?Lab Results  ?Component Value Date  ?  TSH 2.240 01/11/2021  ? ?Lab Results  ?Component Value Date  ? CHOL 132 01/11/2021  ? HDL 54 01/11/2021  ? [place] 61 01/11/2021  ? TRIG 91 01/11/2021  ? ?Lab Results  ?Component Value Date  ? VD25OH 65.3 01/11/2021  ? VD25OH 55.3 05/29/2020  ? VD25OH 60.7 11/01/2019  ? ?Lab Results  ?Component Value Date  ? WBC 8.2 06/02/2021  ? HGB 10.8 (L) 06/02/2021  ? HCT 34.6 (L) 06/02/2021  ? MCV 98.9 06/02/2021  ? PLT 219 06/02/2021  ? ?No results found for: IRON, TIBC, FERRITIN ? ?Obesity Behavioral Intervention:  ? ?Approximately 15 minutes were spent on the discussion below. ? ?ASK: ?We discussed the diagnosis of obesity with Melissa Leach today and Melissa Leach agreed to give [place] permission to discuss obesity behavioral modification therapy today. ? ?ASSESS: ?Melissa Leach has the diagnosis of obesity and her BMI today is 33.3. Melissa Leach is in the action stage of change.  ? ?ADVISE: ?Melissa Leach was educated on the multiple health risks of obesity as well as the benefit of weight loss to improve her health. She was advised of the need for long term treatment and the importance of lifestyle modifications to improve her current health and to decrease her risk of future health problems. ? ?AGREE: ?Multiple dietary modification options and treatment options were discussed and Melissa Leach agreed to follow the recommendations documented in the above note. ? ?ARRANGE: ?Melissa Leach was educated on the importance of frequent visits to treat obesity as outlined per CMS and USPSTF guidelines and agreed to schedule her next follow up appointment today. ? ?Attestation Statements:  ? ?Reviewed by clinician on day of visit: allergies, medications, problem list, medical history, surgical history, family history, social history, and previous encounter notes. ? ? ?I, Trixie Dredge, am acting as transcriptionist for Dennard Nip, MD. ? ?I have reviewed the above documentation for accuracy and completeness, and I agree with the above. -  Dennard Nip, MD ? ? ?

## 2021-08-07 IMAGING — CT CT SHOULDER*L* W/O CM
1 of 3 series · 7 of 14 positions shown, 9 images · non-contrast
Comparison: None.

CLINICAL DATA: Chronic left shoulder pain and decreased range of
motion. Preoperative planning examination.

EXAM:
CT OF THE UPPER LEFT EXTREMITY WITHOUT CONTRAST
TECHNIQUE: Multidetector CT imaging of the upper left extremity was performed
according to the standard protocol.

[Series 3: thin soft · axial · 0.56mm/px · z∈[-201,-62]mm · 7 of 311 slices shown, 9 images]
[im 39/311  soft-tissue]
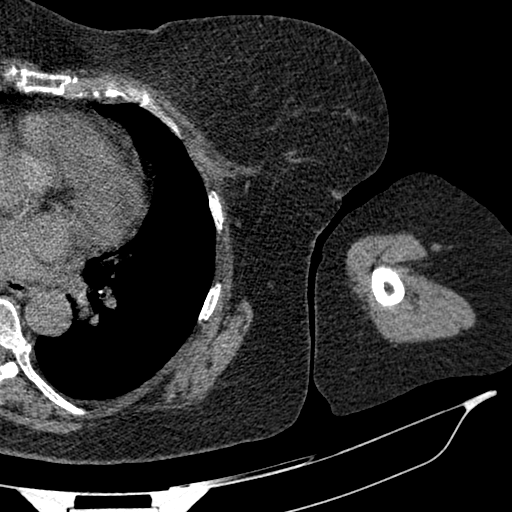
[im 39/311  bone]
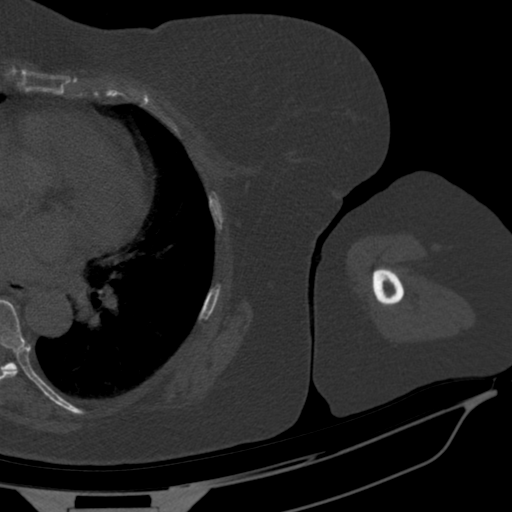
[im 78/311  bone]
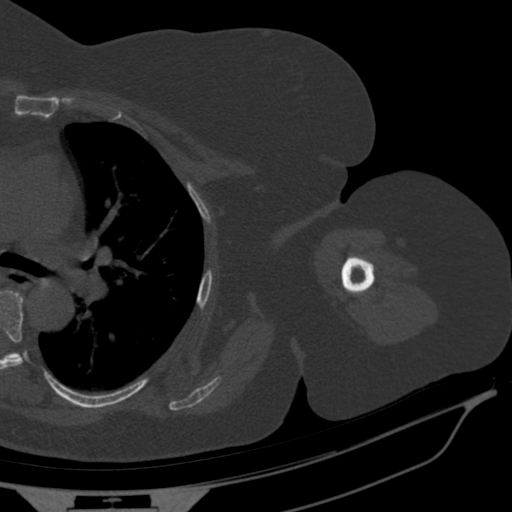
[im 117/311  bone]
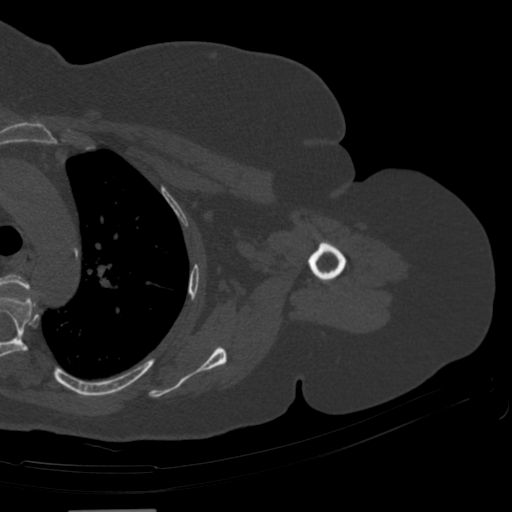
[im 156/311  bone]
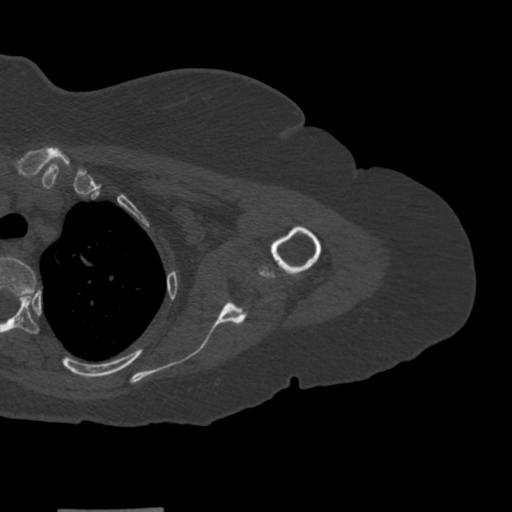
[im 194/311  soft-tissue]
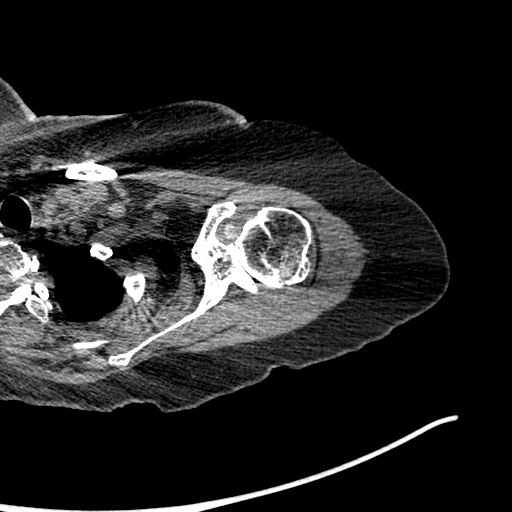
[im 194/311  bone]
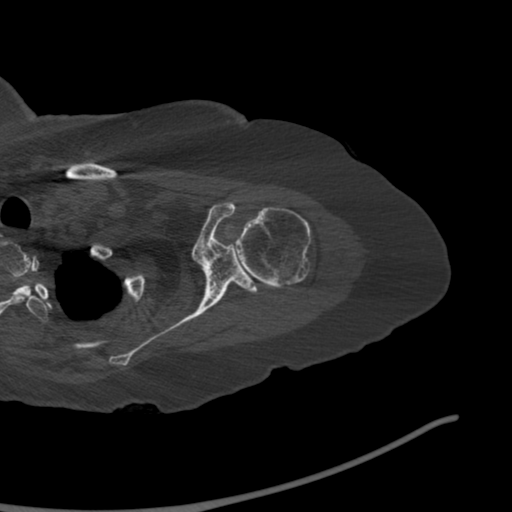
[im 233/311  bone]
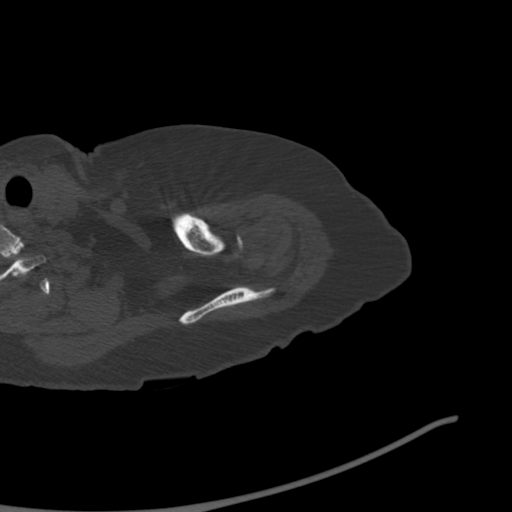
[im 272/311  bone]
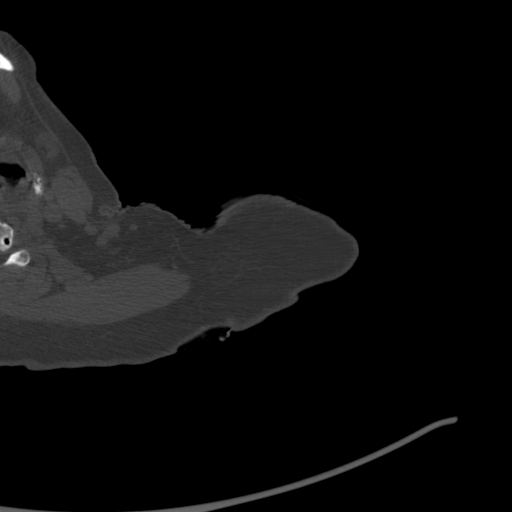

[7 of 14 positions shown; findings below may reference images not displayed]

FINDINGS: Bones/Joint/Cartilage

No acute bony or joint abnormality. The patient has advanced
glenohumeral osteoarthritis with bone-on-bone joint space narrowing
and a large osteophyte off the humeral head. There are a few
subchondral cyst in the glenoid. The 2 largest are in the inferior
glenoid and measure approximately 0.5 cm in diameter.

The patient has mild acromioclavicular degenerative disease. The
acromion is type 1 with a small subacromial spur. No lytic or
sclerotic lesion.

Ligaments

Suboptimally assessed by CT.

Muscles and Tendons

As visualized by CT, the rotator cuff appears intact. Musculature of
the shoulder girdle is preserved.

Soft tissues

Imaged lung parenchyma is clear. Aortic atherosclerosis is noted.
The heart is incompletely visualized but appears enlarged.
IMPRESSION: Advanced glenohumeral osteoarthritis as described.

Mild acromioclavicular degenerative change.

Aortic Atherosclerosis ([id number]).

## 2021-08-09 ENCOUNTER — Other Ambulatory Visit (INDEPENDENT_AMBULATORY_CARE_PROVIDER_SITE_OTHER): Payer: Self-pay | Admitting: Family Medicine

## 2021-08-09 DIAGNOSIS — R7303 Prediabetes: Secondary | ICD-10-CM

## 2021-08-09 DIAGNOSIS — G5602 Carpal tunnel syndrome, left upper limb: Secondary | ICD-10-CM | POA: Diagnosis not present

## 2021-08-16 ENCOUNTER — Ambulatory Visit (INDEPENDENT_AMBULATORY_CARE_PROVIDER_SITE_OTHER): Payer: Medicare HMO | Admitting: Family Medicine

## 2021-08-16 ENCOUNTER — Encounter (INDEPENDENT_AMBULATORY_CARE_PROVIDER_SITE_OTHER): Payer: Self-pay | Admitting: Family Medicine

## 2021-08-16 VITALS — BP 100/66 | HR 78 | Temp 97.6°F | Ht 61.0 in | Wt 176.0 lb

## 2021-08-16 DIAGNOSIS — M25512 Pain in left shoulder: Secondary | ICD-10-CM | POA: Diagnosis not present

## 2021-08-16 DIAGNOSIS — E559 Vitamin D deficiency, unspecified: Secondary | ICD-10-CM

## 2021-08-16 DIAGNOSIS — R7303 Prediabetes: Secondary | ICD-10-CM

## 2021-08-16 DIAGNOSIS — R69 Illness, unspecified: Secondary | ICD-10-CM | POA: Diagnosis not present

## 2021-08-16 DIAGNOSIS — Z7984 Long term (current) use of oral hypoglycemic drugs: Secondary | ICD-10-CM

## 2021-08-16 DIAGNOSIS — F3289 Other specified depressive episodes: Secondary | ICD-10-CM

## 2021-08-16 DIAGNOSIS — Z6833 Body mass index (BMI) 33.0-33.9, adult: Secondary | ICD-10-CM | POA: Diagnosis not present

## 2021-08-16 DIAGNOSIS — E669 Obesity, unspecified: Secondary | ICD-10-CM | POA: Diagnosis not present

## 2021-08-16 MED ORDER — METFORMIN HCL 500 MG PO TABS: 500.0000 mg | ORAL_TABLET | Freq: Every day | ORAL | 0 refills | Status: DC

## 2021-08-16 MED ORDER — BUPROPION HCL ER (SR) 150 MG PO TB12: 150.0000 mg | ORAL_TABLET | Freq: Two times a day (BID) | ORAL | 1 refills | Status: DC

## 2021-08-16 MED ORDER — VITAMIN D (ERGOCALCIFEROL) 1.25 MG (50000 UNIT) PO CAPS: ORAL_CAPSULE | ORAL | 0 refills | Status: DC

## 2021-08-16 MED ORDER — TOPIRAMATE 25 MG PO TABS: 25.0000 mg | ORAL_TABLET | Freq: Every day | ORAL | 0 refills | Status: DC

## 2021-08-22 DIAGNOSIS — E039 Hypothyroidism, unspecified: Secondary | ICD-10-CM | POA: Diagnosis not present

## 2021-08-23 ENCOUNTER — Other Ambulatory Visit (INDEPENDENT_AMBULATORY_CARE_PROVIDER_SITE_OTHER): Payer: Self-pay | Admitting: Family Medicine

## 2021-08-23 DIAGNOSIS — F3289 Other specified depressive episodes: Secondary | ICD-10-CM

## 2021-08-23 DIAGNOSIS — M25512 Pain in left shoulder: Secondary | ICD-10-CM | POA: Diagnosis not present

## 2021-08-23 DIAGNOSIS — M25642 Stiffness of left hand, not elsewhere classified: Secondary | ICD-10-CM | POA: Diagnosis not present

## 2021-08-30 DIAGNOSIS — M25512 Pain in left shoulder: Secondary | ICD-10-CM | POA: Diagnosis not present

## 2021-08-30 NOTE — Progress Notes (Signed)
Chief Complaint:   OBESITY Banner is here to discuss her progress with her obesity treatment plan along with follow-up of her obesity related diagnoses. Toronda is on the Category 2 Plan and states she is following her eating plan approximately 50% of the time. Hang states she is walking for 40 minutes 7 times per week.  Today's visit was #: 54 Starting weight: 216 lbs Starting date: 06/06/2021 Today's weight: 176 lbs Today's date: 08/16/2021 Total lbs lost to date: 40 Total lbs lost since last in-office visit: 0  Interim History: Lyrae has done well with maintaining her weight. She is struggling with increased sugar cravings, but she is trying to increase walking to help decrease cravings.  Subjective:   1. Vitamin D deficiency Treniece is stable on Vitamin D, with no side effects noted. Her level is not yet at goal.   2. Pre-diabetes Lovelee is stable on metformin, and she denies nausea, vomiting, or hypoglycemia.   3. Other depression with emotional eating Sherelyn notes increased stress recently, and she notes increased emotional eating behaviors.   Assessment/Plan:   1. Vitamin D deficiency We will refill prescription Vitamin D for 1 month. Karielys will follow-up for routine testing of Vitamin D, at least 2-3 times per year to avoid over-replacement.  - Vitamin D, Ergocalciferol, (DRISDOL) 1.25 MG (50000 UNIT) CAPS capsule; TAKE 1 CAPSULE BY MOUTH ONE TIME PER WEEK  Dispense: 4 capsule; Refill: 0  2. Pre-diabetes We will refill metformin for 1 month. Janiyla will continue to work on weight loss, exercise, and decreasing simple carbohydrates to help decrease the risk of diabetes.   - metFORMIN (GLUCOPHAGE) 500 MG tablet; Take 1 tablet (500 mg total) by mouth daily with breakfast.  Dispense: 30 tablet; Refill: 0  3. Other depression with emotional eating Bria agreed to increase Wellbutrin SR to 150 mg BID with 1 refill; and we will refill Topamax for 1 month.  Behavior modification techniques were discussed today to help Kealy deal with her emotional/non-hunger eating behaviors. Orders and follow up as documented in patient record.   - buPROPion (WELLBUTRIN SR) 150 MG 12 hr tablet; Take 1 tablet (150 mg total) by mouth 2 (two) times daily.  Dispense: 60 tablet; Refill: 1 - topiramate (TOPAMAX) 25 MG tablet; Take 1 tablet (25 mg total) by mouth daily.  Dispense: 30 tablet; Refill: 0  4. Obesity, Current BMI 33.3 Charlena is currently in the action stage of change. As such, her goal is to continue with weight loss efforts. She has agreed to the Category 2 Plan.   Exercise goals: As is.  Behavioral modification strategies: ways to avoid night time snacking.  Tajai has agreed to follow-up with our clinic in 3 weeks. She was informed of the importance of frequent follow-up visits to maximize her success with intensive lifestyle modifications for her multiple health conditions.   Objective:   Blood pressure 100/66, pulse 78, temperature 97.6 F (36.4 C), height 5\' 1"  (1.549 m), weight 176 lb (79.8 kg), last menstrual period 12/27/2002, SpO2 97 %. Body mass index is 33.25 kg/m.  General: Cooperative, alert, well developed, in no acute distress. HEENT: Conjunctivae and lids unremarkable. Cardiovascular: Regular rhythm.  Lungs: Normal work of breathing. Neurologic: No focal deficits.   Lab Results  Component Value Date   CREATININE 0.83 06/01/2021   BUN 15 06/01/2021   NA 135 06/01/2021   K 3.8 06/01/2021   CL 103 06/01/2021   CO2 23 06/01/2021   Lab Results  Component Value Date   ALT 21 05/29/2021   AST 26 05/29/2021   ALKPHOS 83 05/29/2021   BILITOT 0.6 05/29/2021   Lab Results  Component Value Date   HGBA1C 5.2 05/29/2021   HGBA1C 5.4 01/11/2021   HGBA1C 5.3 05/29/2020   HGBA1C 5.4 03/14/2020   HGBA1C 5.5 11/01/2019   Lab Results  Component Value Date   INSULIN 9.1 01/11/2021   INSULIN 9.2 05/29/2020   INSULIN 8.3  11/01/2019   INSULIN 9.8 07/22/2019   INSULIN 12.1 04/08/2019   Lab Results  Component Value Date   TSH 2.240 01/11/2021   Lab Results  Component Value Date   CHOL 132 01/11/2021   HDL 54 01/11/2021   LDLCALC 61 01/11/2021   TRIG 91 01/11/2021   Lab Results  Component Value Date   VD25OH 65.3 01/11/2021   VD25OH 55.3 05/29/2020   VD25OH 60.7 11/01/2019   Lab Results  Component Value Date   WBC 8.2 06/02/2021   HGB 10.8 (L) 06/02/2021   HCT 34.6 (L) 06/02/2021   MCV 98.9 06/02/2021   PLT 219 06/02/2021   No results found for: IRON, TIBC, FERRITIN  Obesity Behavioral Intervention:   Approximately 15 minutes were spent on the discussion below.  ASK: We discussed the diagnosis of obesity with Paree today and Deundra agreed to give [place] permission to discuss obesity behavioral modification therapy today.  ASSESS: Mareta has the diagnosis of obesity and her BMI today is 33.3. Eilley is in the action stage of change.   ADVISE: Lucerito was educated on the multiple health risks of obesity as well as the benefit of weight loss to improve her health. She was advised of the need for long term treatment and the importance of lifestyle modifications to improve her current health and to decrease her risk of future health problems.  AGREE: Multiple dietary modification options and treatment options were discussed and Londan agreed to follow the recommendations documented in the above note.  ARRANGE: Chele was educated on the importance of frequent visits to treat obesity as outlined per CMS and USPSTF guidelines and agreed to schedule her next follow up appointment today.  Attestation Statements:   Reviewed by clinician on day of visit: allergies, medications, problem list, medical history, surgical history, family history, social history, and previous encounter notes.   I, Trixie Dredge, am acting as transcriptionist for Dennard Nip, MD.  I have reviewed the above  documentation for accuracy and completeness, and I agree with the above. -  Dennard Nip, MD

## 2021-09-03 DIAGNOSIS — Z79899 Other long term (current) drug therapy: Secondary | ICD-10-CM | POA: Diagnosis not present

## 2021-09-03 DIAGNOSIS — E782 Mixed hyperlipidemia: Secondary | ICD-10-CM | POA: Diagnosis not present

## 2021-09-05 ENCOUNTER — Ambulatory Visit (INDEPENDENT_AMBULATORY_CARE_PROVIDER_SITE_OTHER): Payer: Medicare HMO | Admitting: Family Medicine

## 2021-09-05 DIAGNOSIS — R69 Illness, unspecified: Secondary | ICD-10-CM | POA: Diagnosis not present

## 2021-09-05 DIAGNOSIS — G43909 Migraine, unspecified, not intractable, without status migrainosus: Secondary | ICD-10-CM | POA: Diagnosis not present

## 2021-09-05 DIAGNOSIS — K257 Chronic gastric ulcer without hemorrhage or perforation: Secondary | ICD-10-CM | POA: Diagnosis not present

## 2021-09-05 DIAGNOSIS — E039 Hypothyroidism, unspecified: Secondary | ICD-10-CM | POA: Diagnosis not present

## 2021-09-05 DIAGNOSIS — E8881 Metabolic syndrome: Secondary | ICD-10-CM | POA: Diagnosis not present

## 2021-09-05 DIAGNOSIS — R7303 Prediabetes: Secondary | ICD-10-CM | POA: Diagnosis not present

## 2021-09-05 DIAGNOSIS — L439 Lichen planus, unspecified: Secondary | ICD-10-CM | POA: Diagnosis not present

## 2021-09-05 DIAGNOSIS — K219 Gastro-esophageal reflux disease without esophagitis: Secondary | ICD-10-CM | POA: Diagnosis not present

## 2021-09-05 DIAGNOSIS — Z0000 Encounter for general adult medical examination without abnormal findings: Secondary | ICD-10-CM | POA: Diagnosis not present

## 2021-09-05 DIAGNOSIS — E782 Mixed hyperlipidemia: Secondary | ICD-10-CM | POA: Diagnosis not present

## 2021-09-05 DIAGNOSIS — Z79899 Other long term (current) drug therapy: Secondary | ICD-10-CM | POA: Diagnosis not present

## 2021-09-06 ENCOUNTER — Ambulatory Visit (INDEPENDENT_AMBULATORY_CARE_PROVIDER_SITE_OTHER): Payer: Medicare HMO | Admitting: Family Medicine

## 2021-09-06 ENCOUNTER — Encounter (INDEPENDENT_AMBULATORY_CARE_PROVIDER_SITE_OTHER): Payer: Self-pay | Admitting: Family Medicine

## 2021-09-06 VITALS — BP 92/62 | HR 83 | Temp 98.2°F | Ht 61.0 in | Wt 177.0 lb

## 2021-09-06 DIAGNOSIS — E559 Vitamin D deficiency, unspecified: Secondary | ICD-10-CM | POA: Diagnosis not present

## 2021-09-06 DIAGNOSIS — G2581 Restless legs syndrome: Secondary | ICD-10-CM

## 2021-09-06 DIAGNOSIS — E669 Obesity, unspecified: Secondary | ICD-10-CM | POA: Diagnosis not present

## 2021-09-06 DIAGNOSIS — Z7984 Long term (current) use of oral hypoglycemic drugs: Secondary | ICD-10-CM | POA: Diagnosis not present

## 2021-09-06 DIAGNOSIS — R7303 Prediabetes: Secondary | ICD-10-CM

## 2021-09-06 DIAGNOSIS — M25512 Pain in left shoulder: Secondary | ICD-10-CM | POA: Diagnosis not present

## 2021-09-06 DIAGNOSIS — Z6833 Body mass index (BMI) 33.0-33.9, adult: Secondary | ICD-10-CM | POA: Diagnosis not present

## 2021-09-06 DIAGNOSIS — F3289 Other specified depressive episodes: Secondary | ICD-10-CM | POA: Diagnosis not present

## 2021-09-06 DIAGNOSIS — R69 Illness, unspecified: Secondary | ICD-10-CM | POA: Diagnosis not present

## 2021-09-06 MED ORDER — VITAMIN D (ERGOCALCIFEROL) 1.25 MG (50000 UNIT) PO CAPS: ORAL_CAPSULE | ORAL | 0 refills | Status: DC

## 2021-09-06 MED ORDER — BUPROPION HCL ER (SR) 150 MG PO TB12: 150.0000 mg | ORAL_TABLET | Freq: Two times a day (BID) | ORAL | 1 refills | Status: DC

## 2021-09-06 MED ORDER — TOPIRAMATE 25 MG PO TABS: 25.0000 mg | ORAL_TABLET | Freq: Every day | ORAL | 0 refills | Status: DC

## 2021-09-06 MED ORDER — METFORMIN HCL 500 MG PO TABS: 500.0000 mg | ORAL_TABLET | Freq: Every day | ORAL | 0 refills | Status: DC

## 2021-09-07 LAB — CBC WITH DIFFERENTIAL/PLATELET
Basophils Absolute: 0.1 10*3/uL (ref 0.0–0.2)
Basos: 1 %
EOS (ABSOLUTE): 0.2 10*3/uL (ref 0.0–0.4)
Eos: 3 %
Hematocrit: 41.4 % (ref 34.0–46.6)
Hemoglobin: 13.8 g/dL (ref 11.1–15.9)
Immature Grans (Abs): 0 10*3/uL (ref 0.0–0.1)
Immature Granulocytes: 0 %
Lymphocytes Absolute: 1.6 10*3/uL (ref 0.7–3.1)
Lymphs: 22 %
MCH: 30.9 pg (ref 26.6–33.0)
MCHC: 33.3 g/dL (ref 31.5–35.7)
MCV: 93 fL (ref 79–97)
Monocytes Absolute: 0.7 10*3/uL (ref 0.1–0.9)
Monocytes: 9 %
Neutrophils Absolute: 4.7 10*3/uL (ref 1.4–7.0)
Neutrophils: 65 %
Platelets: 267 10*3/uL (ref 150–450)
RBC: 4.46 x10E6/uL (ref 3.77–5.28)
RDW: 12.7 % (ref 11.7–15.4)
WBC: 7.3 10*3/uL (ref 3.4–10.8)

## 2021-09-07 LAB — VITAMIN B12: Vitamin B-12: 1149 pg/mL (ref 232–1245)

## 2021-09-07 LAB — MAGNESIUM: Magnesium: 2.2 mg/dL (ref 1.6–2.3)

## 2021-09-12 DIAGNOSIS — G43909 Migraine, unspecified, not intractable, without status migrainosus: Secondary | ICD-10-CM | POA: Diagnosis not present

## 2021-09-12 DIAGNOSIS — E785 Hyperlipidemia, unspecified: Secondary | ICD-10-CM | POA: Diagnosis not present

## 2021-09-12 DIAGNOSIS — K219 Gastro-esophageal reflux disease without esophagitis: Secondary | ICD-10-CM | POA: Diagnosis not present

## 2021-09-12 DIAGNOSIS — Z6834 Body mass index (BMI) 34.0-34.9, adult: Secondary | ICD-10-CM | POA: Diagnosis not present

## 2021-09-12 DIAGNOSIS — Z791 Long term (current) use of non-steroidal anti-inflammatories (NSAID): Secondary | ICD-10-CM | POA: Diagnosis not present

## 2021-09-12 DIAGNOSIS — M1990 Unspecified osteoarthritis, unspecified site: Secondary | ICD-10-CM | POA: Diagnosis not present

## 2021-09-12 DIAGNOSIS — E039 Hypothyroidism, unspecified: Secondary | ICD-10-CM | POA: Diagnosis not present

## 2021-09-12 DIAGNOSIS — E669 Obesity, unspecified: Secondary | ICD-10-CM | POA: Diagnosis not present

## 2021-09-12 DIAGNOSIS — R69 Illness, unspecified: Secondary | ICD-10-CM | POA: Diagnosis not present

## 2021-09-12 DIAGNOSIS — E119 Type 2 diabetes mellitus without complications: Secondary | ICD-10-CM | POA: Diagnosis not present

## 2021-09-12 DIAGNOSIS — E559 Vitamin D deficiency, unspecified: Secondary | ICD-10-CM | POA: Diagnosis not present

## 2021-09-12 DIAGNOSIS — F419 Anxiety disorder, unspecified: Secondary | ICD-10-CM | POA: Diagnosis not present

## 2021-09-13 DIAGNOSIS — M25512 Pain in left shoulder: Secondary | ICD-10-CM | POA: Diagnosis not present

## 2021-09-15 NOTE — Progress Notes (Unsigned)
Chief Complaint:   OBESITY Melissa Leach is here to discuss her progress with her obesity treatment plan along with follow-up of her obesity related diagnoses. Melissa Leach is on the Category 2 Plan and states she is following her eating plan approximately 60% of the time. Melissa Leach states she is walking  60 minutes 7 times per week.  Today's visit was #: 32 Starting weight: 216 lbs Starting date: 06/06/2021 Today's weight: 177 lbs Today's date: 09/06/2021 Total lbs lost to date: 39 lbs Total lbs lost since last in-office visit: 0  Interim History: This is Melissa Leach's first visit with me.  She had TSH, FLP, CMP at PCP's office visit yesterday, all within normal limits.  Melissa Leach had tried to give blood last week and was told she could not because hemoglobin was too low.  Subjective:   1. Vitamin D deficiency She is currently taking prescription vitamin D 50,000 IU each week. She denies nausea, vomiting or muscle weakness.  2. Restless legs Kajuana has had 2 months of symptoms.  Has the feeling that she needs to mover her feet and legs several times per night.   3. Pre-diabetes Melissa Leach has a diagnosis of prediabetes based on her elevated HgA1c and was informed this puts her at greater risk of developing diabetes. She continues to work on diet and exercise to decrease her risk of diabetes. She denies nausea or hypoglycemia.  4. Other depression with emotional eating Melissa Leach's Buproprion was increased 3 weeks ago by Dr Dalbert Garnet, she has been tolerating it well.  She denies any negative affects on her sleep.  Her sadness has not improved.     Assessment/Plan:   Orders Placed This Encounter  Procedures   Magnesium   CBC with Differential/Platelet   Vitamin B12    Medications Discontinued During This Encounter  Medication Reason   buPROPion (WELLBUTRIN SR) 150 MG 12 hr tablet Reorder   topiramate (TOPAMAX) 25 MG tablet Reorder   Vitamin D, Ergocalciferol, (DRISDOL) 1.25 MG (50000 UNIT)  CAPS capsule Reorder   metFORMIN (GLUCOPHAGE) 500 MG tablet Reorder     Meds ordered this encounter  Medications   Vitamin D, Ergocalciferol, (DRISDOL) 1.25 MG (50000 UNIT) CAPS capsule    Sig: TAKE 1 CAPSULE BY MOUTH ONE TIME PER WEEK    Dispense:  4 capsule    Refill:  0   topiramate (TOPAMAX) 25 MG tablet    Sig: Take 1 tablet (25 mg total) by mouth daily.    Dispense:  30 tablet    Refill:  0   buPROPion (WELLBUTRIN SR) 150 MG 12 hr tablet    Sig: Take 1 tablet (150 mg total) by mouth 2 (two) times daily.    Dispense:  60 tablet    Refill:  1   metFORMIN (GLUCOPHAGE) 500 MG tablet    Sig: Take 1 tablet (500 mg total) by mouth daily with breakfast.    Dispense:  30 tablet    Refill:  0     1. Vitamin D deficiency Refill - Vitamin D, Ergocalciferol, (DRISDOL) 1.25 MG (50000 UNIT) CAPS capsule; TAKE 1 CAPSULE BY MOUTH ONE TIME PER WEEK  Dispense: 4 capsule; Refill: 0  2. Restless legs Check labs today. See below.  - Magnesium - CBC with Differential/Platelet - Vitamin B12  3. Pre-diabetes Refill - metFORMIN (GLUCOPHAGE) 500 MG tablet; Take 1 tablet (500 mg total) by mouth daily with breakfast.  Dispense: 30 tablet; Refill: 0  4. Other depression with emotional eating Refill- topiramate (  TOPAMAX) 25 MG tablet; Take 1 tablet (25 mg total) by mouth daily.  Dispense: 30 tablet; Refill: 0  Refill- buPROPion (WELLBUTRIN SR) 150 MG 12 hr tablet; Take 1 tablet (150 mg total) by mouth 2 (two) times daily.  Dispense: 60 tablet; Refill: 1  5. Obesity, Current BMI 33.4 Melissa Leach will focus on not skipping proteins.  Melissa Leach is currently in the action stage of change. As such, her goal is to continue with weight loss efforts. She has agreed to the Category 2 Plan.   Exercise goals:  As is.   Behavioral modification strategies: increasing lean protein intake and planning for success.  Melissa Leach has agreed to follow-up with our clinic in 4 weeks with Dr Dalbert Garnet.  She was informed of  the importance of frequent follow-up visits to maximize her success with intensive lifestyle modifications for her multiple health conditions.   Melissa Leach was informed we would discuss her lab results at her next visit unless there is a critical issue that needs to be addressed sooner. Melissa Leach agreed to keep her next visit at the agreed upon time to discuss these results.  Objective:   Blood pressure 92/62, pulse 83, temperature 98.2 F (36.8 C), height 5\' 1"  (1.549 m), weight 177 lb (80.3 kg), last menstrual period 12/27/2002, SpO2 97 %. Body mass index is 33.44 kg/m.  General: Cooperative, alert, well developed, in no acute distress. HEENT: Conjunctivae and lids unremarkable. Cardiovascular: Regular rhythm.  Lungs: Normal work of breathing. Neurologic: No focal deficits.   Lab Results  Component Value Date   CREATININE 0.83 06/01/2021   BUN 15 06/01/2021   NA 135 06/01/2021   K 3.8 06/01/2021   CL 103 06/01/2021   CO2 23 06/01/2021   Lab Results  Component Value Date   ALT 21 05/29/2021   AST 26 05/29/2021   ALKPHOS 83 05/29/2021   BILITOT 0.6 05/29/2021   Lab Results  Component Value Date   HGBA1C 5.2 05/29/2021   HGBA1C 5.4 01/11/2021   HGBA1C 5.3 05/29/2020   HGBA1C 5.4 03/14/2020   HGBA1C 5.5 11/01/2019   Lab Results  Component Value Date   INSULIN 9.1 01/11/2021   INSULIN 9.2 05/29/2020   INSULIN 8.3 11/01/2019   INSULIN 9.8 07/22/2019   INSULIN 12.1 04/08/2019   Lab Results  Component Value Date   TSH 2.240 01/11/2021   Lab Results  Component Value Date   CHOL 132 01/11/2021   HDL 54 01/11/2021   LDLCALC 61 01/11/2021   TRIG 91 01/11/2021   Lab Results  Component Value Date   VD25OH 65.3 01/11/2021   VD25OH 55.3 05/29/2020   VD25OH 60.7 11/01/2019   Lab Results  Component Value Date   WBC 7.3 09/06/2021   HGB 13.8 09/06/2021   HCT 41.4 09/06/2021   MCV 93 09/06/2021   PLT 267 09/06/2021   No results found for: "IRON", "TIBC",  "FERRITIN"  Obesity Behavioral Intervention:   Approximately 15 minutes were spent on the discussion below.  ASK: We discussed the diagnosis of obesity with Melissa Leach today and Melissa Leach agreed to give Melissa Leach permission to discuss obesity behavioral modification therapy today.  ASSESS: Tamanika has the diagnosis of obesity and her BMI today is 33.4. Damaria is in the action stage of change.   ADVISE: Oakley was educated on the multiple health risks of obesity as well as the benefit of weight loss to improve her health. She was advised of the need for long term treatment and the importance of lifestyle modifications to  improve her current health and to decrease her risk of future health problems.  AGREE: Multiple dietary modification options and treatment options were discussed and Audreana agreed to follow the recommendations documented in the above note.  ARRANGE: Melissa Leach was educated on the importance of frequent visits to treat obesity as outlined per CMS and USPSTF guidelines and agreed to schedule her next follow up appointment today.  Attestation Statements:   Reviewed by clinician on day of visit: allergies, medications, problem list, medical history, surgical history, family history, social history, and previous encounter notes.  I, Melissa Leach, RMA, am acting as Location manager for [employer], DO.  I have reviewed the above documentation for accuracy and completeness, and I agree with the above. Melissa Leach, D.O.  The [place] was signed into law in 2016 which includes the topic of electronic health records.  This provides immediate access to information in MyChart.  This includes consultation notes, operative notes, office notes, lab results and pathology reports.  If you have any questions about what you read please let us know at your next visit so we can discuss your concerns and take corrective action if need be.  We are right here with you.

## 2021-09-20 ENCOUNTER — Other Ambulatory Visit (INDEPENDENT_AMBULATORY_CARE_PROVIDER_SITE_OTHER): Payer: Self-pay | Admitting: Family Medicine

## 2021-09-20 DIAGNOSIS — M25512 Pain in left shoulder: Secondary | ICD-10-CM | POA: Diagnosis not present

## 2021-09-20 DIAGNOSIS — F3289 Other specified depressive episodes: Secondary | ICD-10-CM

## 2021-09-26 ENCOUNTER — Ambulatory Visit: Payer: Medicare HMO | Admitting: Neurology

## 2021-09-26 DIAGNOSIS — G43709 Chronic migraine without aura, not intractable, without status migrainosus: Secondary | ICD-10-CM

## 2021-09-26 NOTE — Progress Notes (Signed)
Consent Form Botulism Toxin Injection For Chronic Migraine  09/26/2021: stable. : Doing great, >>70% decrease migraines.   Reviewed orally with patient, additionally signature is on file:  Botulism toxin has been approved by the Federal drug administration for treatment of chronic migraine. Botulism toxin does not cure chronic migraine and it may not be effective in some patients.  The administration of botulism toxin is accomplished by injecting a small amount of toxin into the muscles of the neck and head. Dosage must be titrated for each individual. Any benefits resulting from botulism toxin tend to wear off after 3 months with a repeat injection required if benefit is to be maintained. Injections are usually done every 3-4 months with maximum effect peak achieved by about 2 or 3 weeks. Botulism toxin is expensive and you should be sure of what costs you will incur resulting from the injection.  The side effects of botulism toxin use for chronic migraine may include:   -Transient, and usually mild, facial weakness with facial injections  -Transient, and usually mild, head or neck weakness with head/neck injections  -Reduction or loss of forehead facial animation due to forehead muscle weakness  -Eyelid drooping  -Dry eye  -Pain at the site of injection or bruising at the site of injection  -Double vision  -Potential unknown long term risks  Contraindications: You should not have Botox if you are pregnant, nursing, allergic to albumin, have an infection, skin condition, or muscle weakness at the site of the injection, or have myasthenia gravis, Lambert-Eaton syndrome, or ALS.  It is also possible that as with any injection, there may be an allergic reaction or no effect from the medication. Reduced effectiveness after repeated injections is sometimes seen and rarely infection at the injection site may occur. All care will be taken to prevent these side effects. If therapy is given over a  long time, atrophy and wasting in the muscle injected may occur. Occasionally the patient's become refractory to treatment because they develop antibodies to the toxin. In this event, therapy needs to be modified.  I have read the above information and consent to the administration of botulism toxin.    BOTOX PROCEDURE NOTE FOR MIGRAINE HEADACHE    Contraindications and precautions discussed with patient(above). Aseptic procedure was observed and patient tolerated procedure. Procedure performed by Dr. Artemio Aly  The condition has existed for more than 6 months, and pt does not have a diagnosis of ALS, Myasthenia Gravis or Lambert-Eaton Syndrome.  Risks and benefits of injections discussed and pt agrees to proceed with the procedure.  Written consent obtained  These injections are medically necessary. Pt  receives good benefits from these injections. These injections do not cause sedations or hallucinations which the oral therapies may cause.  Description of procedure:  The patient was placed in a sitting position. The standard protocol was used for Botox as follows, with 5 units of Botox injected at each site:   -Procerus muscle, midline injection  -Corrugator muscle, bilateral injection  -Frontalis muscle, bilateral injection, with 2 sites each side, medial injection was performed in the upper one third of the frontalis muscle, in the region vertical from the medial inferior edge of the superior orbital rim. The lateral injection was again in the upper one third of the forehead vertically above the lateral limbus of the cornea, 1.5 cm lateral to the medial injection site.  -Temporalis muscle injection, 4 sites, bilaterally. The first injection was 3 cm above the tragus of the ear,  second injection site was 1.5 cm to 3 cm up from the first injection site in line with the tragus of the ear. The third injection site was 1.5-3 cm forward between the first 2 injection sites. The fourth  injection site was 1.5 cm posterior to the second injection site.   -Occipitalis muscle injection, 3 sites, bilaterally. The first injection was done one half way between the occipital protuberance and the tip of the mastoid process behind the ear. The second injection site was done lateral and superior to the first, 1 fingerbreadth from the first injection. The third injection site was 1 fingerbreadth superiorly and medially from the first injection site.  -Cervical paraspinal muscle injection, 2 sites, bilateral knee first injection site was 1 cm from the midline of the cervical spine, 3 cm inferior to the lower border of the occipital protuberance. The second injection site was 1.5 cm superiorly and laterally to the first injection site.  -Trapezius muscle injection was performed at 3 sites, bilaterally. The first injection site was in the upper trapezius muscle halfway between the inflection point of the neck, and the acromion. The second injection site was one half way between the acromion and the first injection site. The third injection was done between the first injection site and the inflection point of the neck.   Will return for repeat injection in 3 months.   155 units of Botox was used, 45u Botox not injected was wasted. The patient tolerated the procedure well, there were no complications of the above procedure.

## 2021-09-26 NOTE — Progress Notes (Signed)
Botox- 200 units x 1 vial Lot: [id number] Expiration: 05/2024 NDC: 0023-3921-02  Bacteriostatic 0.9% Sodium Chloride- 4mL total Lot: [id number] Expiration: 10/31/2022 NDC: 0409-1966-02  Dx: G43.709 B/B  

## 2021-09-27 DIAGNOSIS — M25512 Pain in left shoulder: Secondary | ICD-10-CM | POA: Diagnosis not present

## 2021-10-04 ENCOUNTER — Other Ambulatory Visit (INDEPENDENT_AMBULATORY_CARE_PROVIDER_SITE_OTHER): Payer: Self-pay | Admitting: Family Medicine

## 2021-10-04 DIAGNOSIS — R7303 Prediabetes: Secondary | ICD-10-CM

## 2021-10-08 ENCOUNTER — Ambulatory Visit (INDEPENDENT_AMBULATORY_CARE_PROVIDER_SITE_OTHER): Payer: Medicare HMO | Admitting: Family Medicine

## 2021-10-09 ENCOUNTER — Encounter (INDEPENDENT_AMBULATORY_CARE_PROVIDER_SITE_OTHER): Payer: Self-pay | Admitting: Family Medicine

## 2021-10-09 ENCOUNTER — Ambulatory Visit (INDEPENDENT_AMBULATORY_CARE_PROVIDER_SITE_OTHER): Payer: Medicare HMO | Admitting: Family Medicine

## 2021-10-09 VITALS — BP 88/61 | HR 78 | Temp 97.8°F | Ht 61.0 in | Wt 176.0 lb

## 2021-10-09 DIAGNOSIS — Z7984 Long term (current) use of oral hypoglycemic drugs: Secondary | ICD-10-CM | POA: Diagnosis not present

## 2021-10-09 DIAGNOSIS — R7303 Prediabetes: Secondary | ICD-10-CM | POA: Diagnosis not present

## 2021-10-09 DIAGNOSIS — R69 Illness, unspecified: Secondary | ICD-10-CM | POA: Diagnosis not present

## 2021-10-09 DIAGNOSIS — Z6833 Body mass index (BMI) 33.0-33.9, adult: Secondary | ICD-10-CM

## 2021-10-09 DIAGNOSIS — E559 Vitamin D deficiency, unspecified: Secondary | ICD-10-CM | POA: Diagnosis not present

## 2021-10-09 DIAGNOSIS — F3289 Other specified depressive episodes: Secondary | ICD-10-CM | POA: Diagnosis not present

## 2021-10-09 DIAGNOSIS — E669 Obesity, unspecified: Secondary | ICD-10-CM | POA: Diagnosis not present

## 2021-10-09 MED ORDER — VITAMIN D (ERGOCALCIFEROL) 1.25 MG (50000 UNIT) PO CAPS
ORAL_CAPSULE | ORAL | 0 refills | Status: DC
Start: 2021-10-09 — End: 2021-11-07

## 2021-10-09 MED ORDER — METFORMIN HCL 500 MG PO TABS: 500.0000 mg | ORAL_TABLET | Freq: Every day | ORAL | 0 refills | Status: DC

## 2021-10-09 MED ORDER — BUPROPION HCL ER (SR) 150 MG PO TB12: 150.0000 mg | ORAL_TABLET | Freq: Two times a day (BID) | ORAL | 0 refills | Status: DC

## 2021-10-09 MED ORDER — TOPIRAMATE 25 MG PO TABS: 25.0000 mg | ORAL_TABLET | Freq: Every day | ORAL | 0 refills | Status: DC

## 2021-10-09 NOTE — Progress Notes (Unsigned)
Chief Complaint:   OBESITY Brion is here to discuss her progress with her obesity treatment plan along with follow-up of her obesity related diagnoses. Ebonie is on the Category 2 Plan and states she is following her eating plan approximately 50% of the time. Shanetra states she is walking for 30 minutes 7 times per week.  Today's visit was #: 33 Starting weight: 216 lbs Starting date: 06/06/2021 Today's weight: 176 lbs Today's date: 10/09/2021 Total lbs lost to date: 40 Total lbs lost since last in-office visit: 1  Interim History: Jaleiah continues to do well with weight loss.  She will be traveling a lot in the next few weeks, and she would like to discuss travel eating strategies.  Subjective:   1. Vitamin D deficiency Kerilyn is stable on vitamin D, and she denies nausea, vomiting, or muscle weakness.  2. Pre-diabetes Tiffancy continues to work on her diet and exercise, and she notes decreased polyphagia overall.  3. Other depression with emotional eating Taylah continues to work on decreasing emotional eating behaviors.  She has no problems with her medications.  Assessment/Plan:   1. Vitamin D deficiency Chee will continue prescription vitamin D 50,000 units once weekly, and we will refill for 1 month.  - Vitamin D, Ergocalciferol, (DRISDOL) 1.25 MG (50000 UNIT) CAPS capsule; TAKE 1 CAPSULE BY MOUTH ONE TIME PER WEEK  Dispense: 4 capsule; Refill: 0  2. Pre-diabetes Blessings will continue metformin 500 mg once daily with breakfast, and we will refill for 1 month.  - metFORMIN (GLUCOPHAGE) 500 MG tablet; Take 1 tablet (500 mg total) by mouth daily with breakfast.  Dispense: 30 tablet; Refill: 0  3. Other depression with emotional eating Yesmin will continue her medications, and we will refill Wellbutrin SR and Topamax for 1 month.  - topiramate (TOPAMAX) 25 MG tablet; Take 1 tablet (25 mg total) by mouth daily.  Dispense: 30 tablet; Refill: 0 - buPROPion  (WELLBUTRIN SR) 150 MG 12 hr tablet; Take 1 tablet (150 mg total) by mouth 2 (two) times daily.  Dispense: 60 tablet; Refill: 0  4. Obesity, Current BMI 33.4 Abbigael is currently in the action stage of change. As such, her goal is to continue with weight loss efforts. She has agreed to the Category 2 Plan.   Exercise goals: As is.   Behavioral modification strategies: increasing lean protein intake, increasing water intake, and travel eating strategies.  Nevah has agreed to follow-up with our clinic in 4 weeks. She was informed of the importance of frequent follow-up visits to maximize her success with intensive lifestyle modifications for her multiple health conditions.   Objective:   Blood pressure (!) 88/61, pulse 78, temperature 97.8 F (36.6 C), height 5\' 1"  (1.549 m), weight 176 lb (79.8 kg), last menstrual period 12/27/2002, SpO2 98 %. Body mass index is 33.25 kg/m.  General: Cooperative, alert, well developed, in no acute distress. HEENT: Conjunctivae and lids unremarkable. Cardiovascular: Regular rhythm.  Lungs: Normal work of breathing. Neurologic: No focal deficits.   Lab Results  Component Value Date   CREATININE 0.83 06/01/2021   BUN 15 06/01/2021   NA 135 06/01/2021   K 3.8 06/01/2021   CL 103 06/01/2021   CO2 23 06/01/2021   Lab Results  Component Value Date   ALT 21 05/29/2021   AST 26 05/29/2021   ALKPHOS 83 05/29/2021   BILITOT 0.6 05/29/2021   Lab Results  Component Value Date   HGBA1C 5.2 05/29/2021   HGBA1C 5.4  01/11/2021   HGBA1C 5.3 05/29/2020   HGBA1C 5.4 03/14/2020   HGBA1C 5.5 11/01/2019   Lab Results  Component Value Date   INSULIN 9.1 01/11/2021   INSULIN 9.2 05/29/2020   INSULIN 8.3 11/01/2019   INSULIN 9.8 07/22/2019   INSULIN 12.1 04/08/2019   Lab Results  Component Value Date   TSH 2.240 01/11/2021   Lab Results  Component Value Date   CHOL 132 01/11/2021   HDL 54 01/11/2021   LDLCALC 61 01/11/2021   TRIG 91 01/11/2021    Lab Results  Component Value Date   VD25OH 65.3 01/11/2021   VD25OH 55.3 05/29/2020   VD25OH 60.7 11/01/2019   Lab Results  Component Value Date   WBC 7.3 09/06/2021   HGB 13.8 09/06/2021   HCT 41.4 09/06/2021   MCV 93 09/06/2021   PLT 267 09/06/2021   No results found for: "IRON", "TIBC", "FERRITIN"  Obesity Behavioral Intervention:   Approximately 15 minutes were spent on the discussion below.  ASK: We discussed the diagnosis of obesity with Novah today and Jozalyn agreed to give [place] permission to discuss obesity behavioral modification therapy today.  ASSESS: Lasonja has the diagnosis of obesity and her BMI today is 33.4. Lavetta is in the action stage of change.   ADVISE: Imani was educated on the multiple health risks of obesity as well as the benefit of weight loss to improve her health. She was advised of the need for long term treatment and the importance of lifestyle modifications to improve her current health and to decrease her risk of future health problems.  AGREE: Multiple dietary modification options and treatment options were discussed and Jamayah agreed to follow the recommendations documented in the above note.  ARRANGE: Bennie was educated on the importance of frequent visits to treat obesity as outlined per CMS and USPSTF guidelines and agreed to schedule her next follow up appointment today.  Attestation Statements:   Reviewed by clinician on day of visit: allergies, medications, problem list, medical history, surgical history, family history, social history, and previous encounter notes.   I, Burt Knack, am acting as transcriptionist for Quillian Quince, MD.  I have reviewed the above documentation for accuracy and completeness, and I agree with the above. -  Quillian Quince, MD

## 2021-10-15 ENCOUNTER — Other Ambulatory Visit (INDEPENDENT_AMBULATORY_CARE_PROVIDER_SITE_OTHER): Payer: Self-pay | Admitting: Family Medicine

## 2021-10-15 DIAGNOSIS — F3289 Other specified depressive episodes: Secondary | ICD-10-CM

## 2021-10-23 DIAGNOSIS — L718 Other rosacea: Secondary | ICD-10-CM | POA: Diagnosis not present

## 2021-10-23 DIAGNOSIS — L820 Inflamed seborrheic keratosis: Secondary | ICD-10-CM | POA: Diagnosis not present

## 2021-10-23 DIAGNOSIS — I8392 Asymptomatic varicose veins of left lower extremity: Secondary | ICD-10-CM | POA: Diagnosis not present

## 2021-10-23 DIAGNOSIS — L570 Actinic keratosis: Secondary | ICD-10-CM | POA: Diagnosis not present

## 2021-10-23 DIAGNOSIS — L821 Other seborrheic keratosis: Secondary | ICD-10-CM | POA: Diagnosis not present

## 2021-10-31 DIAGNOSIS — M25512 Pain in left shoulder: Secondary | ICD-10-CM | POA: Diagnosis not present

## 2021-11-01 ENCOUNTER — Other Ambulatory Visit (INDEPENDENT_AMBULATORY_CARE_PROVIDER_SITE_OTHER): Payer: Self-pay | Admitting: Family Medicine

## 2021-11-01 DIAGNOSIS — F3289 Other specified depressive episodes: Secondary | ICD-10-CM

## 2021-11-01 DIAGNOSIS — R7303 Prediabetes: Secondary | ICD-10-CM

## 2021-11-07 ENCOUNTER — Encounter (INDEPENDENT_AMBULATORY_CARE_PROVIDER_SITE_OTHER): Payer: Self-pay

## 2021-11-07 ENCOUNTER — Ambulatory Visit (INDEPENDENT_AMBULATORY_CARE_PROVIDER_SITE_OTHER): Payer: Medicare HMO | Admitting: Family Medicine

## 2021-11-07 ENCOUNTER — Encounter (INDEPENDENT_AMBULATORY_CARE_PROVIDER_SITE_OTHER): Payer: Self-pay | Admitting: Family Medicine

## 2021-11-07 VITALS — BP 101/68 | HR 62 | Temp 97.8°F | Ht 61.0 in | Wt 175.0 lb

## 2021-11-07 DIAGNOSIS — E559 Vitamin D deficiency, unspecified: Secondary | ICD-10-CM

## 2021-11-07 DIAGNOSIS — E669 Obesity, unspecified: Secondary | ICD-10-CM

## 2021-11-07 DIAGNOSIS — R7303 Prediabetes: Secondary | ICD-10-CM | POA: Diagnosis not present

## 2021-11-07 DIAGNOSIS — F3289 Other specified depressive episodes: Secondary | ICD-10-CM

## 2021-11-07 DIAGNOSIS — Z7984 Long term (current) use of oral hypoglycemic drugs: Secondary | ICD-10-CM | POA: Diagnosis not present

## 2021-11-07 DIAGNOSIS — R69 Illness, unspecified: Secondary | ICD-10-CM | POA: Diagnosis not present

## 2021-11-07 DIAGNOSIS — Z6833 Body mass index (BMI) 33.0-33.9, adult: Secondary | ICD-10-CM

## 2021-11-07 MED ORDER — BUPROPION HCL ER (SR) 150 MG PO TB12: 150.0000 mg | ORAL_TABLET | Freq: Two times a day (BID) | ORAL | 1 refills | Status: DC

## 2021-11-07 MED ORDER — TOPIRAMATE 25 MG PO TABS: 25.0000 mg | ORAL_TABLET | Freq: Every day | ORAL | 1 refills | Status: DC

## 2021-11-07 MED ORDER — VITAMIN D (ERGOCALCIFEROL) 1.25 MG (50000 UNIT) PO CAPS: ORAL_CAPSULE | ORAL | 1 refills | Status: DC

## 2021-11-07 MED ORDER — METFORMIN HCL 500 MG PO TABS: 500.0000 mg | ORAL_TABLET | Freq: Every day | ORAL | 1 refills | Status: DC

## 2021-11-12 DIAGNOSIS — M25512 Pain in left shoulder: Secondary | ICD-10-CM | POA: Diagnosis not present

## 2021-11-13 ENCOUNTER — Other Ambulatory Visit (INDEPENDENT_AMBULATORY_CARE_PROVIDER_SITE_OTHER): Payer: Self-pay | Admitting: Family Medicine

## 2021-11-13 DIAGNOSIS — F3289 Other specified depressive episodes: Secondary | ICD-10-CM

## 2021-11-14 NOTE — Progress Notes (Signed)
Chief Complaint:   OBESITY Melissa Leach is here to discuss her progress with her obesity treatment plan along with follow-up of her obesity related diagnoses. Melissa Leach is on the Category 2 Plan and states she is following her eating plan approximately 50% of the time. Melissa Leach states she is walking for 20 minutes 2 times per week.  Today's visit was #: 34 Starting weight: 216 lbs Starting date: 06/06/2021 Today's weight: 175 lbs Today's date: 11/07/2021 Total lbs lost to date: 41 Total lbs lost since last in-office visit: 1  Interim History: Melissa Leach has been on vacation in [place] and did well with portion control. She is especially, struggling  with sugar and bread cravings. She will continue traveling this Summer.    Subjective:   1. Vitamin D deficiency Melissa Leach is stable on Vitamin D, with no signs of over-replacement.  2. Pre-diabetes Melissa Leach is working on her diet but struggling with carbohydrates. She denies nausea, vomiting, and hypoglycemia.   3. Other depression with emotional eating Melissa Leach is working on decreasing emotional eating behaviors. She notes some increase in sugar and bread cravings especially after vacation.   Assessment/Plan:   1. Vitamin D deficiency We will refill prescription Vitamin D 50,000 IU every week for 2 months. Melissa Leach will follow-up for routine testing of Vitamin D, at least 2-3 times per year to avoid over-replacement.  - Vitamin D, Ergocalciferol, (DRISDOL) 1.25 MG (50000 UNIT) CAPS capsule; TAKE 1 CAPSULE BY MOUTH ONE TIME PER WEEK  Dispense: 4 capsule; Refill: 1  2. Pre-diabetes We will refill metformin for 2 months. Melissa Leach will continue to work on weight loss, exercise, and decreasing simple carbohydrates to help decrease the risk of diabetes.   - metFORMIN (GLUCOPHAGE) 500 MG tablet; Take 1 tablet (500 mg total) by mouth daily with breakfast.  Dispense: 30 tablet; Refill: 1  3. Other depression with emotional eating We will refill  Wellbutrin SR and Topamax 25 mg for 2 months. Melissa Leach is to work on decrease on temptations at home.   - buPROPion (WELLBUTRIN SR) 150 MG 12 hr tablet; Take 1 tablet (150 mg total) by mouth 2 (two) times daily.  Dispense: 60 tablet; Refill: 1 - topiramate (TOPAMAX) 25 MG tablet; Take 1 tablet (25 mg total) by mouth daily.  Dispense: 30 tablet; Refill: 1  4. Obesity, Current BMI 33.1 Melissa Leach is currently in the action stage of change. As such, her goal is to continue with weight loss efforts. She has agreed to the Category 2 Plan.   Exercise goals: As is.   Behavioral modification strategies: increasing lean protein intake.  Melissa Leach has agreed to follow-up with our clinic in 4 weeks. She was informed of the importance of frequent follow-up visits to maximize her success with intensive lifestyle modifications for her multiple health conditions.   Objective:   Blood pressure 101/68, pulse 62, temperature 97.8 F (36.6 C), height 5\' 1"  (1.549 m), weight 175 lb (79.4 kg), last menstrual period 12/27/2002, SpO2 98 %. Body mass index is 33.07 kg/m.  General: Cooperative, alert, well developed, in no acute distress. HEENT: Conjunctivae and lids unremarkable. Cardiovascular: Regular rhythm.  Lungs: Normal work of breathing. Neurologic: No focal deficits.   Lab Results  Component Value Date   CREATININE 0.83 06/01/2021   BUN 15 06/01/2021   NA 135 06/01/2021   K 3.8 06/01/2021   CL 103 06/01/2021   CO2 23 06/01/2021   Lab Results  Component Value Date   ALT 21 05/29/2021   AST  26 05/29/2021   ALKPHOS 83 05/29/2021   BILITOT 0.6 05/29/2021   Lab Results  Component Value Date   HGBA1C 5.2 05/29/2021   HGBA1C 5.4 01/11/2021   HGBA1C 5.3 05/29/2020   HGBA1C 5.4 03/14/2020   HGBA1C 5.5 11/01/2019   Lab Results  Component Value Date   INSULIN 9.1 01/11/2021   INSULIN 9.2 05/29/2020   INSULIN 8.3 11/01/2019   INSULIN 9.8 07/22/2019   INSULIN 12.1 04/08/2019   Lab Results   Component Value Date   TSH 2.240 01/11/2021   Lab Results  Component Value Date   CHOL 132 01/11/2021   HDL 54 01/11/2021   LDLCALC 61 01/11/2021   TRIG 91 01/11/2021   Lab Results  Component Value Date   VD25OH 65.3 01/11/2021   VD25OH 55.3 05/29/2020   VD25OH 60.7 11/01/2019   Lab Results  Component Value Date   WBC 7.3 09/06/2021   HGB 13.8 09/06/2021   HCT 41.4 09/06/2021   MCV 93 09/06/2021   PLT 267 09/06/2021   No results found for: "IRON", "TIBC", "FERRITIN"  Obesity Behavioral Intervention:   Approximately 15 minutes were spent on the discussion below.  ASK: We discussed the diagnosis of obesity with Melissa Leach today and Melissa Leach agreed to give [place] permission to discuss obesity behavioral modification therapy today.  ASSESS: Melissa Leach has the diagnosis of obesity and her BMI today is 33.1. Melissa Leach is in the action stage of change.   ADVISE: Melissa Leach was educated on the multiple health risks of obesity as well as the benefit of weight loss to improve her health. She was advised of the need for long term treatment and the importance of lifestyle modifications to improve her current health and to decrease her risk of future health problems.  AGREE: Multiple dietary modification options and treatment options were discussed and Melissa Leach agreed to follow the recommendations documented in the above note.  ARRANGE: Melissa Leach was educated on the importance of frequent visits to treat obesity as outlined per CMS and USPSTF guidelines and agreed to schedule her next follow up appointment today.  Attestation Statements:   Reviewed by clinician on day of visit: allergies, medications, problem list, medical history, surgical history, family history, social history, and previous encounter notes.   I, Burt Knack, am acting as transcriptionist for Quillian Quince, MD.  I have reviewed the above documentation for accuracy and completeness, and I agree with the above. -  Quillian Quince, MD

## 2021-11-27 DIAGNOSIS — M25512 Pain in left shoulder: Secondary | ICD-10-CM | POA: Diagnosis not present

## 2021-12-02 ENCOUNTER — Other Ambulatory Visit (INDEPENDENT_AMBULATORY_CARE_PROVIDER_SITE_OTHER): Payer: Self-pay | Admitting: Family Medicine

## 2021-12-02 DIAGNOSIS — R7303 Prediabetes: Secondary | ICD-10-CM

## 2021-12-04 DIAGNOSIS — M25512 Pain in left shoulder: Secondary | ICD-10-CM | POA: Diagnosis not present

## 2021-12-11 ENCOUNTER — Encounter (INDEPENDENT_AMBULATORY_CARE_PROVIDER_SITE_OTHER): Payer: Self-pay | Admitting: Family Medicine

## 2021-12-11 ENCOUNTER — Ambulatory Visit (INDEPENDENT_AMBULATORY_CARE_PROVIDER_SITE_OTHER): Payer: Medicare HMO | Admitting: Family Medicine

## 2021-12-11 VITALS — BP 89/47 | HR 85 | Temp 97.9°F | Ht 61.0 in | Wt 175.0 lb

## 2021-12-11 DIAGNOSIS — R7303 Prediabetes: Secondary | ICD-10-CM | POA: Diagnosis not present

## 2021-12-11 DIAGNOSIS — F3289 Other specified depressive episodes: Secondary | ICD-10-CM | POA: Diagnosis not present

## 2021-12-11 DIAGNOSIS — E559 Vitamin D deficiency, unspecified: Secondary | ICD-10-CM

## 2021-12-11 DIAGNOSIS — E669 Obesity, unspecified: Secondary | ICD-10-CM

## 2021-12-11 DIAGNOSIS — E7800 Pure hypercholesterolemia, unspecified: Secondary | ICD-10-CM

## 2021-12-11 DIAGNOSIS — M25512 Pain in left shoulder: Secondary | ICD-10-CM | POA: Diagnosis not present

## 2021-12-11 DIAGNOSIS — E7849 Other hyperlipidemia: Secondary | ICD-10-CM

## 2021-12-11 DIAGNOSIS — Z6833 Body mass index (BMI) 33.0-33.9, adult: Secondary | ICD-10-CM | POA: Diagnosis not present

## 2021-12-11 DIAGNOSIS — R69 Illness, unspecified: Secondary | ICD-10-CM | POA: Diagnosis not present

## 2021-12-11 MED ORDER — TOPIRAMATE 25 MG PO TABS: 25.0000 mg | ORAL_TABLET | Freq: Every day | ORAL | 1 refills | Status: DC

## 2021-12-11 MED ORDER — METFORMIN HCL 500 MG PO TABS: 500.0000 mg | ORAL_TABLET | Freq: Every day | ORAL | 1 refills | Status: DC

## 2021-12-11 MED ORDER — VITAMIN D (ERGOCALCIFEROL) 1.25 MG (50000 UNIT) PO CAPS: ORAL_CAPSULE | ORAL | 1 refills | Status: DC

## 2021-12-12 ENCOUNTER — Other Ambulatory Visit (INDEPENDENT_AMBULATORY_CARE_PROVIDER_SITE_OTHER): Payer: Self-pay | Admitting: Family Medicine

## 2021-12-12 DIAGNOSIS — F3289 Other specified depressive episodes: Secondary | ICD-10-CM

## 2021-12-12 LAB — CMP14+EGFR
ALT: 16 IU/L (ref 0–32)
AST: 25 IU/L (ref 0–40)
Albumin/Globulin Ratio: 2 (ref 1.2–2.2)
Albumin: 5.1 g/dL — ABNORMAL HIGH (ref 3.9–4.9)
Alkaline Phosphatase: 123 IU/L — ABNORMAL HIGH (ref 44–121)
BUN/Creatinine Ratio: 28 (ref 12–28)
BUN: 33 mg/dL — ABNORMAL HIGH (ref 8–27)
Bilirubin Total: 0.3 mg/dL (ref 0.0–1.2)
CO2: 21 mmol/L (ref 20–29)
Calcium: 9.8 mg/dL (ref 8.7–10.3)
Chloride: 93 mmol/L — ABNORMAL LOW (ref 96–106)
Creatinine, Ser: 1.17 mg/dL — ABNORMAL HIGH (ref 0.57–1.00)
Globulin, Total: 2.6 g/dL (ref 1.5–4.5)
Glucose: 81 mg/dL (ref 70–99)
Potassium: 4 mmol/L (ref 3.5–5.2)
Sodium: 136 mmol/L (ref 134–144)
Total Protein: 7.7 g/dL (ref 6.0–8.5)
eGFR: 50 mL/min/{1.73_m2} — ABNORMAL LOW (ref >59)

## 2021-12-12 LAB — INSULIN, RANDOM: INSULIN: 15.5 u[IU]/mL (ref 2.6–24.9)

## 2021-12-12 LAB — HEMOGLOBIN A1C
Est. average glucose Bld gHb Est-mCnc: 111 mg/dL
Hgb A1c MFr Bld: 5.5 % (ref 4.8–5.6)

## 2021-12-12 LAB — LIPID PANEL WITH LDL/HDL RATIO
Cholesterol, Total: 146 mg/dL (ref 100–199)
HDL: 51 mg/dL (ref >39)
LDL Chol Calc (NIH): 73 mg/dL (ref 0–99)
LDL/HDL Ratio: 1.4 ratio (ref 0.0–3.2)
Triglycerides: 126 mg/dL (ref 0–149)
VLDL Cholesterol Cal: 22 mg/dL (ref 5–40)

## 2021-12-12 LAB — VITAMIN D 25 HYDROXY (VIT D DEFICIENCY, FRACTURES): Vit D, 25-Hydroxy: 39.1 ng/mL (ref 30.0–100.0)

## 2021-12-12 LAB — TSH: TSH: 1.22 u[IU]/mL (ref 0.450–4.500)

## 2021-12-17 NOTE — Progress Notes (Unsigned)
Chief Complaint:   OBESITY Melissa Leach is here to discuss her progress with her obesity treatment plan along with follow-up of her obesity related diagnoses. Melissa Leach is on the Category 2 Plan and states she is following her eating plan approximately 50% of the time. Melissa Leach states she is walking for 40 minutes 7 times per week.  Today's visit was #: 12 Starting weight: 216 lbs Starting date: 06/06/2021 Today's weight: 175 lbs Today's date: 12/11/2021 Total lbs lost to date: 41 Total lbs lost since last in-office visit: 0  Interim History: Melissa Leach has done well with maintaining her weight in the last month.  She is not following her plan closely, but she is working on not skipping meals.  She is not meeting her protein goal.  Subjective:   1. Vitamin D deficiency Melissa Leach is on vitamin D, and she is due to have labs done.  She is at risk for over replacement.  2. Pre-diabetes Melissa Leach is doing well with her diet, and she is due for labs.  3. Other hyperlipidemia Melissa Leach is working on her diet, and she is due for labs.  She is stable on Lipitor.  4. Other depression with emotional eating Melissa Leach is still working on decreasing emotional eating behaviors and she struggles at times.  Assessment/Plan:   1. Vitamin D deficiency We will check labs today.  Melissa Leach will continue prescription vitamin D 50,000 units once weekly, and we will refill for 2 months.  - Vitamin D, Ergocalciferol, (DRISDOL) 1.25 MG (50000 UNIT) CAPS capsule; TAKE 1 CAPSULE BY MOUTH ONE TIME PER WEEK  Dispense: 4 capsule; Refill: 1 - VITAMIN D 25 Hydroxy (Vit-D Deficiency, Fractures)  2. Pre-diabetes We will check labs today.  Melissa Leach will continue metformin 500 mg every morning, and we will refill for 2 months.  - metFORMIN (GLUCOPHAGE) 500 MG tablet; Take 1 tablet (500 mg total) by mouth daily with breakfast.  Dispense: 30 tablet; Refill: 1 - CMP14+EGFR - Hemoglobin A1c - TSH - Insulin, random  3. Other  hyperlipidemia We will check labs today, and we will follow-up at Melissa Leach's next visit.  - Lipid Panel With LDL/HDL Ratio  4. Other depression with emotional eating Melissa Leach will continue Topamax 25 mg once daily, and we will refill for 2 months.  - topiramate (TOPAMAX) 25 MG tablet; Take 1 tablet (25 mg total) by mouth daily.  Dispense: 30 tablet; Refill: 1  5. Obesity, Current BMI 33.2 Melissa Leach is currently in the action stage of change. As such, her goal is to continue with weight loss efforts. She has agreed to keeping a food journal and adhering to recommended goals of 1200 calories and 75+ grams of protein daily.   Exercise goals: As is.   Behavioral modification strategies: increasing lean protein intake and decreasing liquid calories.  Melissa Leach has agreed to follow-up with our clinic in 3 to 4 weeks. She was informed of the importance of frequent follow-up visits to maximize her success with intensive lifestyle modifications for her multiple health conditions.   Melissa Leach was informed we would discuss her lab results at her next visit unless there is a critical issue that needs to be addressed sooner. Melissa Leach agreed to keep her next visit at the agreed upon time to discuss these results.  Objective:   Blood pressure (!) 89/47, pulse 85, temperature 97.9 F (36.6 C), height '5\' 1"'  (1.549 m), weight 175 lb (79.4 kg), last menstrual period 12/27/2002, SpO2 97 %. Body mass index is 33.07 kg/m.  General: Cooperative, alert, well developed, in no acute distress. HEENT: Conjunctivae and lids unremarkable. Cardiovascular: Regular rhythm.  Lungs: Normal work of breathing. Neurologic: No focal deficits.   Lab Results  Component Value Date   CREATININE 1.17 (H) 12/11/2021   BUN 33 (H) 12/11/2021   NA 136 12/11/2021   K 4.0 12/11/2021   CL 93 (L) 12/11/2021   CO2 21 12/11/2021   Lab Results  Component Value Date   ALT 16 12/11/2021   AST 25 12/11/2021   ALKPHOS 123 (H) 12/11/2021    BILITOT 0.3 12/11/2021   Lab Results  Component Value Date   HGBA1C 5.5 12/11/2021   HGBA1C 5.2 05/29/2021   HGBA1C 5.4 01/11/2021   HGBA1C 5.3 05/29/2020   HGBA1C 5.4 03/14/2020   Lab Results  Component Value Date   INSULIN 15.5 12/11/2021   INSULIN 9.1 01/11/2021   INSULIN 9.2 05/29/2020   INSULIN 8.3 11/01/2019   INSULIN 9.8 07/22/2019   Lab Results  Component Value Date   TSH 1.220 12/11/2021   Lab Results  Component Value Date   CHOL 146 12/11/2021   HDL 51 12/11/2021   LDLCALC 73 12/11/2021   TRIG 126 12/11/2021   Lab Results  Component Value Date   VD25OH 39.1 12/11/2021   VD25OH 65.3 01/11/2021   VD25OH 55.3 05/29/2020   Lab Results  Component Value Date   WBC 7.3 09/06/2021   HGB 13.8 09/06/2021   HCT 41.4 09/06/2021   MCV 93 09/06/2021   PLT 267 09/06/2021   No results found for: "IRON", "TIBC", "FERRITIN"  Attestation Statements:   Reviewed by clinician on day of visit: allergies, medications, problem list, medical history, surgical history, family history, social history, and previous encounter notes.   I, Trixie Dredge, am acting as transcriptionist for Dennard Nip, MD.  I have reviewed the above documentation for accuracy and completeness, and I agree with the above. -  Dennard Nip, MD

## 2021-12-18 DIAGNOSIS — M25512 Pain in left shoulder: Secondary | ICD-10-CM | POA: Diagnosis not present

## 2021-12-19 ENCOUNTER — Telehealth: Payer: Self-pay | Admitting: *Deleted

## 2021-12-19 ENCOUNTER — Ambulatory Visit: Payer: Medicare HMO | Admitting: Neurology

## 2021-12-19 DIAGNOSIS — G43709 Chronic migraine without aura, not intractable, without status migrainosus: Secondary | ICD-10-CM

## 2021-12-19 NOTE — Telephone Encounter (Signed)
Completed Botox (Medical and Pharmacy Benefit) PA on Cover My Meds. Key: [id number].   Form [employer] Specialty Botox Injectable Precertification Form Precertification for Botox Requests [phone] phone [phone] fax

## 2021-12-19 NOTE — Patient Instructions (Signed)
Consent Form Botulism Toxin Injection For Chronic Migraine  12/19/2021: stable  09/26/2021: stable. : Doing great, >>70% decrease migraines.   Reviewed orally with patient, additionally signature is on file:  Botulism toxin has been approved by the Federal drug administration for treatment of chronic migraine. Botulism toxin does not cure chronic migraine and it may not be effective in some patients.  The administration of botulism toxin is accomplished by injecting a small amount of toxin into the muscles of the neck and head. Dosage must be titrated for each individual. Any benefits resulting from botulism toxin tend to wear off after 3 months with a repeat injection required if benefit is to be maintained. Injections are usually done every 3-4 months with maximum effect peak achieved by about 2 or 3 weeks. Botulism toxin is expensive and you should be sure of what costs you will incur resulting from the injection.  The side effects of botulism toxin use for chronic migraine may include:   -Transient, and usually mild, facial weakness with facial injections  -Transient, and usually mild, head or neck weakness with head/neck injections  -Reduction or loss of forehead facial animation due to forehead muscle weakness  -Eyelid drooping  -Dry eye  -Pain at the site of injection or bruising at the site of injection  -Double vision  -Potential unknown long term risks  Contraindications: You should not have Botox if you are pregnant, nursing, allergic to albumin, have an infection, skin condition, or muscle weakness at the site of the injection, or have myasthenia gravis, Lambert-Eaton syndrome, or ALS.  It is also possible that as with any injection, there may be an allergic reaction or no effect from the medication. Reduced effectiveness after repeated injections is sometimes seen and rarely infection at the injection site may occur. All care will be taken to prevent these side effects. If  therapy is given over a long time, atrophy and wasting in the muscle injected may occur. Occasionally the patient's become refractory to treatment because they develop antibodies to the toxin. In this event, therapy needs to be modified.  I have read the above information and consent to the administration of botulism toxin.    BOTOX PROCEDURE NOTE FOR MIGRAINE HEADACHE    Contraindications and precautions discussed with patient(above). Aseptic procedure was observed and patient tolerated procedure. Procedure performed by Dr. Toni Maleigh Bagot  The condition has existed for more than 6 months, and pt does not have a diagnosis of ALS, Myasthenia Gravis or Lambert-Eaton Syndrome.  Risks and benefits of injections discussed and pt agrees to proceed with the procedure.  Written consent obtained  These injections are medically necessary. Pt  receives good benefits from these injections. These injections do not cause sedations or hallucinations which the oral therapies may cause.  Description of procedure:  The patient was placed in a sitting position. The standard protocol was used for Botox as follows, with 5 units of Botox injected at each site:   -Procerus muscle, midline injection  -Corrugator muscle, bilateral injection  -Frontalis muscle, bilateral injection, with 2 sites each side, medial injection was performed in the upper one third of the frontalis muscle, in the region vertical from the medial inferior edge of the superior orbital rim. The lateral injection was again in the upper one third of the forehead vertically above the lateral limbus of the cornea, 1.5 cm lateral to the medial injection site.  -Temporalis muscle injection, 4 sites, bilaterally. The first injection was 3 cm above the tragus   of the ear, second injection site was 1.5 cm to 3 cm up from the first injection site in line with the tragus of the ear. The third injection site was 1.5-3 cm forward between the first 2 injection  sites. The fourth injection site was 1.5 cm posterior to the second injection site.   -Occipitalis muscle injection, 3 sites, bilaterally. The first injection was done one half way between the occipital protuberance and the tip of the mastoid process behind the ear. The second injection site was done lateral and superior to the first, 1 fingerbreadth from the first injection. The third injection site was 1 fingerbreadth superiorly and medially from the first injection site.  -Cervical paraspinal muscle injection, 2 sites, bilateral knee first injection site was 1 cm from the midline of the cervical spine, 3 cm inferior to the lower border of the occipital protuberance. The second injection site was 1.5 cm superiorly and laterally to the first injection site.  -Trapezius muscle injection was performed at 3 sites, bilaterally. The first injection site was in the upper trapezius muscle halfway between the inflection point of the neck, and the acromion. The second injection site was one half way between the acromion and the first injection site. The third injection was done between the first injection site and the inflection point of the neck.   Will return for repeat injection in 3 months.   155 units of Botox was used, 45u Botox not injected was wasted. The patient tolerated the procedure well, there were no complications of the above procedure.   

## 2021-12-19 NOTE — Progress Notes (Addendum)
Consent Form Botulism Toxin Injection For Chronic Migraine  12/19/2021: stable  09/26/2021: stable. : Doing great, >>70% decrease migraines.   Reviewed orally with patient, additionally signature is on file:  Botulism toxin has been approved by the Federal drug administration for treatment of chronic migraine. Botulism toxin does not cure chronic migraine and it may not be effective in some patients.  The administration of botulism toxin is accomplished by injecting a small amount of toxin into the muscles of the neck and head. Dosage must be titrated for each individual. Any benefits resulting from botulism toxin tend to wear off after 3 months with a repeat injection required if benefit is to be maintained. Injections are usually done every 3-4 months with maximum effect peak achieved by about 2 or 3 weeks. Botulism toxin is expensive and you should be sure of what costs you will incur resulting from the injection.  The side effects of botulism toxin use for chronic migraine may include:   -Transient, and usually mild, facial weakness with facial injections  -Transient, and usually mild, head or neck weakness with head/neck injections  -Reduction or loss of forehead facial animation due to forehead muscle weakness  -Eyelid drooping  -Dry eye  -Pain at the site of injection or bruising at the site of injection  -Double vision  -Potential unknown long term risks  Contraindications: You should not have Botox if you are pregnant, nursing, allergic to albumin, have an infection, skin condition, or muscle weakness at the site of the injection, or have myasthenia gravis, Lambert-Eaton syndrome, or ALS.  It is also possible that as with any injection, there may be an allergic reaction or no effect from the medication. Reduced effectiveness after repeated injections is sometimes seen and rarely infection at the injection site may occur. All care will be taken to prevent these side effects. If  therapy is given over a long time, atrophy and wasting in the muscle injected may occur. Occasionally the patient's become refractory to treatment because they develop antibodies to the toxin. In this event, therapy needs to be modified.  I have read the above information and consent to the administration of botulism toxin.    BOTOX PROCEDURE NOTE FOR MIGRAINE HEADACHE    Contraindications and precautions discussed with patient(above). Aseptic procedure was observed and patient tolerated procedure. Procedure performed by Dr. Georgia Dom  The condition has existed for more than 6 months, and pt does not have a diagnosis of ALS, Myasthenia Gravis or Lambert-Eaton Syndrome.  Risks and benefits of injections discussed and pt agrees to proceed with the procedure.  Written consent obtained  These injections are medically necessary. Pt  receives good benefits from these injections. These injections do not cause sedations or hallucinations which the oral therapies may cause.  Description of procedure:  The patient was placed in a sitting position. The standard protocol was used for Botox as follows, with 5 units of Botox injected at each site:   -Procerus muscle, midline injection  -Corrugator muscle, bilateral injection  -Frontalis muscle, bilateral injection, with 2 sites each side, medial injection was performed in the upper one third of the frontalis muscle, in the region vertical from the medial inferior edge of the superior orbital rim. The lateral injection was again in the upper one third of the forehead vertically above the lateral limbus of the cornea, 1.5 cm lateral to the medial injection site.  -Temporalis muscle injection, 4 sites, bilaterally. The first injection was 3 cm above the tragus  of the ear, second injection site was 1.5 cm to 3 cm up from the first injection site in line with the tragus of the ear. The third injection site was 1.5-3 cm forward between the first 2 injection  sites. The fourth injection site was 1.5 cm posterior to the second injection site.   -Occipitalis muscle injection, 3 sites, bilaterally. The first injection was done one half way between the occipital protuberance and the tip of the mastoid process behind the ear. The second injection site was done lateral and superior to the first, 1 fingerbreadth from the first injection. The third injection site was 1 fingerbreadth superiorly and medially from the first injection site.  -Cervical paraspinal muscle injection, 2 sites, bilateral knee first injection site was 1 cm from the midline of the cervical spine, 3 cm inferior to the lower border of the occipital protuberance. The second injection site was 1.5 cm superiorly and laterally to the first injection site.  -Trapezius muscle injection was performed at 3 sites, bilaterally. The first injection site was in the upper trapezius muscle halfway between the inflection point of the neck, and the acromion. The second injection site was one half way between the acromion and the first injection site. The third injection was done between the first injection site and the inflection point of the neck.   Will return for repeat injection in 3 months.   155 units of Botox was used, 45u Botox not injected was wasted. The patient tolerated the procedure well, there were no complications of the above procedure.

## 2021-12-19 NOTE — Progress Notes (Addendum)
Botox consent signed by patient  Botox- 200 units x 1 vial Lot: [id number] Expiration: 12/2023 NDC: 3291-9166-06  Bacteriostatic 0.9% Sodium Chloride- 61mL total Lot: [id number] Expiration: 10/31/2022 NDC: 7741-4239-53  Dx: U02.334 B&B

## 2021-12-20 NOTE — Telephone Encounter (Signed)
We received a Botox approval notice from [facility]. Auth # [id number] approved for 4 visits from 12/19/2021 - 12/20/2022.   I called [facility] and spoke with Joanie. I was told the CPT code [phone] does not require authorization. Reference # for call is [id number].

## 2021-12-25 DIAGNOSIS — M25512 Pain in left shoulder: Secondary | ICD-10-CM | POA: Diagnosis not present

## 2021-12-25 MED ORDER — ONABOTULINUMTOXINA 200 UNITS IJ SOLR
155.0000 [IU] | Freq: Once | INTRAMUSCULAR | Status: AC
  Administered 2021-12-19: 155 [IU] via INTRAMUSCULAR

## 2021-12-25 NOTE — Addendum Note (Signed)
Addended by: Gildardo Griffes on: 12/25/2021 02:17 PM   Modules accepted: Orders

## 2021-12-25 NOTE — Addendum Note (Signed)
Addended by: Sarina Ill B on: 12/25/2021 05:06 PM   Modules accepted: Level of Service

## 2022-01-01 DIAGNOSIS — M25512 Pain in left shoulder: Secondary | ICD-10-CM | POA: Diagnosis not present

## 2022-01-07 NOTE — Telephone Encounter (Signed)
Pt rescheduled Botox appt due to going out of town

## 2022-01-08 DIAGNOSIS — M25512 Pain in left shoulder: Secondary | ICD-10-CM | POA: Diagnosis not present

## 2022-01-10 ENCOUNTER — Encounter (INDEPENDENT_AMBULATORY_CARE_PROVIDER_SITE_OTHER): Payer: Self-pay | Admitting: Family Medicine

## 2022-01-10 ENCOUNTER — Ambulatory Visit (INDEPENDENT_AMBULATORY_CARE_PROVIDER_SITE_OTHER): Payer: Medicare HMO | Admitting: Family Medicine

## 2022-01-10 VITALS — BP 81/56 | HR 85 | Temp 98.1°F | Ht 61.0 in | Wt 178.0 lb

## 2022-01-10 DIAGNOSIS — E559 Vitamin D deficiency, unspecified: Secondary | ICD-10-CM | POA: Diagnosis not present

## 2022-01-10 DIAGNOSIS — Z6833 Body mass index (BMI) 33.0-33.9, adult: Secondary | ICD-10-CM

## 2022-01-10 DIAGNOSIS — F3289 Other specified depressive episodes: Secondary | ICD-10-CM

## 2022-01-10 DIAGNOSIS — E669 Obesity, unspecified: Secondary | ICD-10-CM | POA: Diagnosis not present

## 2022-01-10 DIAGNOSIS — Z6841 Body Mass Index (BMI) 40.0 and over, adult: Secondary | ICD-10-CM

## 2022-01-10 DIAGNOSIS — R7303 Prediabetes: Secondary | ICD-10-CM

## 2022-01-10 DIAGNOSIS — R69 Illness, unspecified: Secondary | ICD-10-CM | POA: Diagnosis not present

## 2022-01-10 MED ORDER — METFORMIN HCL 500 MG PO TABS: 500.0000 mg | ORAL_TABLET | Freq: Every day | ORAL | 0 refills | Status: DC

## 2022-01-10 MED ORDER — BUPROPION HCL ER (SR) 150 MG PO TB12: 150.0000 mg | ORAL_TABLET | Freq: Two times a day (BID) | ORAL | 0 refills | Status: DC

## 2022-01-10 MED ORDER — VITAMIN D (ERGOCALCIFEROL) 1.25 MG (50000 UNIT) PO CAPS: ORAL_CAPSULE | ORAL | 0 refills | Status: DC

## 2022-01-10 MED ORDER — TOPIRAMATE 25 MG PO TABS: 25.0000 mg | ORAL_TABLET | Freq: Every day | ORAL | 0 refills | Status: DC

## 2022-01-15 DIAGNOSIS — M25512 Pain in left shoulder: Secondary | ICD-10-CM | POA: Diagnosis not present

## 2022-01-15 NOTE — Progress Notes (Signed)
Chief Complaint:   OBESITY Melissa Leach is here to discuss her progress with her obesity treatment plan along with follow-up of her obesity related diagnoses. Melissa Leach is on keeping a food journal and adhering to recommended goals of 1200 calories and 75+ grams of protein daily and states she is following her eating plan approximately 40% of the time. Melissa Leach states she is walking for 40 minutes 7 times per week.  Today's visit was #: 28 Starting weight: 216 lbs Starting date: 06/06/2021 Today's weight: 178 lbs Today's date: 01/10/2022 Total lbs lost to date: 38 Total lbs lost since last in-office visit: 0  Interim History: Melissa Leach has been struggling with sweet cravings and she has given into these cravings more often recently. She feels she has a sugar addiction and she is thinking about "detoxing" off carbohydrates.   Subjective:   1. Pre-diabetes Melissa Leach is working on her diet, but she is struggling more.   2. Vitamin D deficiency Melissa Leach os on Vitamin D prescription with no side effects noted.   3. Other depression with emotional eating Melissa Leach is struggling with more emotional eating behaviors, and she denies an increase in stress but she feels addicted.   Assessment/Plan:   1. Pre-diabetes Melissa Leach will continue metformin 500 mg daily with breakfast, and we will refill for 1 month. She was changed to the low carbohydrate plan.   - metFORMIN (GLUCOPHAGE) 500 MG tablet; Take 1 tablet (500 mg total) by mouth daily with breakfast.  Dispense: 30 tablet; Refill: 0  2. Vitamin D deficiency Melissa Leach will continue prescription Vitamin D 50,000 IU every week, and we will refill for 1 month. She will follow-up for routine testing of Vitamin D, at least 2-3 times per year to avoid over-replacement.  - Vitamin D, Ergocalciferol, (DRISDOL) 1.25 MG (50000 UNIT) CAPS capsule; TAKE 1 CAPSULE BY MOUTH ONE TIME PER WEEK  Dispense: 4 capsule; Refill: 0  3. Other depression with emotional  eating We will refill Topamax and Wellbutrin SR for 1 month. Melissa Leach was changed to the low carbohydrate plan to help break the sugar addiction cycle.   - topiramate (TOPAMAX) 25 MG tablet; Take 1 tablet (25 mg total) by mouth daily.  Dispense: 30 tablet; Refill: 0 - buPROPion (WELLBUTRIN SR) 150 MG 12 hr tablet; Take 1 tablet (150 mg total) by mouth 2 (two) times daily.  Dispense: 60 tablet; Refill: 0  4. Obesity, Current BMI 33.7 Melissa Leach is currently in the action stage of change. As such, her goal is to continue with weight loss efforts. She has agreed to change to following a lower carbohydrate, vegetable and lean protein rich diet plan.   Exercise goals: As is.   Behavioral modification strategies: increasing lean protein intake and meal planning and cooking strategies.  Melissa Leach has agreed to follow-up with our clinic in 4 weeks. She was informed of the importance of frequent follow-up visits to maximize her success with intensive lifestyle modifications for her multiple health conditions.   Objective:   Blood pressure (!) 81/56, pulse 85, temperature 98.1 F (36.7 C), height 5\' 1"  (1.549 m), weight 178 lb (80.7 kg), last menstrual period 12/27/2002, SpO2 97 %. Body mass index is 33.63 kg/m.  General: Cooperative, alert, well developed, in no acute distress. HEENT: Conjunctivae and lids unremarkable. Cardiovascular: Regular rhythm.  Lungs: Normal work of breathing. Neurologic: No focal deficits.   Lab Results  Component Value Date   CREATININE 1.17 (H) 12/11/2021   BUN 33 (H) 12/11/2021   NA  136 12/11/2021   K 4.0 12/11/2021   CL 93 (L) 12/11/2021   CO2 21 12/11/2021   Lab Results  Component Value Date   ALT 16 12/11/2021   AST 25 12/11/2021   ALKPHOS 123 (H) 12/11/2021   BILITOT 0.3 12/11/2021   Lab Results  Component Value Date   HGBA1C 5.5 12/11/2021   HGBA1C 5.2 05/29/2021   HGBA1C 5.4 01/11/2021   HGBA1C 5.3 05/29/2020   HGBA1C 5.4 03/14/2020   Lab  Results  Component Value Date   INSULIN 15.5 12/11/2021   INSULIN 9.1 01/11/2021   INSULIN 9.2 05/29/2020   INSULIN 8.3 11/01/2019   INSULIN 9.8 07/22/2019   Lab Results  Component Value Date   TSH 1.220 12/11/2021   Lab Results  Component Value Date   CHOL 146 12/11/2021   HDL 51 12/11/2021   LDLCALC 73 12/11/2021   TRIG 126 12/11/2021   Lab Results  Component Value Date   VD25OH 39.1 12/11/2021   VD25OH 65.3 01/11/2021   VD25OH 55.3 05/29/2020   Lab Results  Component Value Date   WBC 7.3 09/06/2021   HGB 13.8 09/06/2021   HCT 41.4 09/06/2021   MCV 93 09/06/2021   PLT 267 09/06/2021   No results found for: "IRON", "TIBC", "FERRITIN"  Attestation Statements:   Reviewed by clinician on day of visit: allergies, medications, problem list, medical history, surgical history, family history, social history, and previous encounter notes.   I, Trixie Dredge, am acting as transcriptionist for Dennard Nip, MD.  I have reviewed the above documentation for accuracy and completeness, and I agree with the above. -  Dennard Nip, MD

## 2022-01-22 DIAGNOSIS — M25512 Pain in left shoulder: Secondary | ICD-10-CM | POA: Diagnosis not present

## 2022-01-29 DIAGNOSIS — M25512 Pain in left shoulder: Secondary | ICD-10-CM | POA: Diagnosis not present

## 2022-02-04 DIAGNOSIS — M25512 Pain in left shoulder: Secondary | ICD-10-CM | POA: Diagnosis not present

## 2022-02-07 ENCOUNTER — Encounter (INDEPENDENT_AMBULATORY_CARE_PROVIDER_SITE_OTHER): Payer: Self-pay | Admitting: Family Medicine

## 2022-02-07 ENCOUNTER — Ambulatory Visit (INDEPENDENT_AMBULATORY_CARE_PROVIDER_SITE_OTHER): Payer: Medicare HMO | Admitting: Family Medicine

## 2022-02-07 VITALS — BP 102/71 | HR 78 | Temp 97.9°F | Ht 61.0 in | Wt 175.6 lb

## 2022-02-07 DIAGNOSIS — E559 Vitamin D deficiency, unspecified: Secondary | ICD-10-CM

## 2022-02-07 DIAGNOSIS — Z6833 Body mass index (BMI) 33.0-33.9, adult: Secondary | ICD-10-CM | POA: Diagnosis not present

## 2022-02-07 DIAGNOSIS — R69 Illness, unspecified: Secondary | ICD-10-CM | POA: Diagnosis not present

## 2022-02-07 DIAGNOSIS — R7303 Prediabetes: Secondary | ICD-10-CM

## 2022-02-07 DIAGNOSIS — E669 Obesity, unspecified: Secondary | ICD-10-CM

## 2022-02-07 DIAGNOSIS — F3289 Other specified depressive episodes: Secondary | ICD-10-CM

## 2022-02-07 MED ORDER — METFORMIN HCL 500 MG PO TABS: 500.0000 mg | ORAL_TABLET | Freq: Every day | ORAL | 0 refills | Status: DC

## 2022-02-07 MED ORDER — TOPIRAMATE 25 MG PO TABS: 25.0000 mg | ORAL_TABLET | Freq: Every day | ORAL | 0 refills | Status: DC

## 2022-02-07 MED ORDER — VITAMIN D (ERGOCALCIFEROL) 1.25 MG (50000 UNIT) PO CAPS: ORAL_CAPSULE | ORAL | 0 refills | Status: DC

## 2022-02-07 MED ORDER — BUPROPION HCL ER (SR) 150 MG PO TB12: 150.0000 mg | ORAL_TABLET | Freq: Two times a day (BID) | ORAL | 0 refills | Status: DC

## 2022-02-12 DIAGNOSIS — M25512 Pain in left shoulder: Secondary | ICD-10-CM | POA: Diagnosis not present

## 2022-02-20 NOTE — Progress Notes (Signed)
Chief Complaint:   OBESITY Melissa Leach is here to discuss her progress with her obesity treatment plan along with follow-up of her obesity related diagnoses. Melissa Leach is on following a lower carbohydrate, vegetable and lean protein rich diet plan and states she is following her eating plan approximately 60% of the time. Melissa Leach states she is walking for 35 minutes 7 times per week.  Today's visit was #: 37 Starting weight: 216 lbs Starting date: 06/06/2021 Today's weight: 175 lbs Today's date: 02/07/2022 Total lbs lost to date: 41 Total lbs lost since last in-office visit: 3  Interim History: Melissa Leach did well with weight loss on her low carbohydrate plan for about 2 weeks. She notes cravings have decreased and she is back on her Category 2 plan. She has a lot of traveling coming up this month.   Subjective:   1. Vitamin D deficiency Melissa Leach is on Vitamin, and her last level was at goal.   2. Pre-diabetes Melissa Leach is on metformin. She is working on her diet and weight loss.   3. Other depression with emotional eating Melissa Leach is working on decreasing emotional eating behaviors. No side effects were noted with her medications.   Assessment/Plan:   1. Vitamin D deficiency We will refill prescription Vitamin D for 1 month. Brenlynn will follow-up for routine testing of Vitamin D, at least 2-3 times per year to avoid over-replacement.  - Vitamin D, Ergocalciferol, (DRISDOL) 1.25 MG (50000 UNIT) CAPS capsule; TAKE 1 CAPSULE BY MOUTH ONE TIME PER WEEK  Dispense: 4 capsule; Refill: 0  2. Pre-diabetes We will refill metformin for 1 month. Anaia will continue to work on her diet, exercise, and decreasing simple carbohydrates to help decrease the risk of diabetes.   - metFORMIN (GLUCOPHAGE) 500 MG tablet; Take 1 tablet (500 mg total) by mouth daily with breakfast.  Dispense: 30 tablet; Refill: 0  3. Other depression with emotional eating Melissa Leach will continue her medications, and we will  refill Wellbutrin SR and Topamax for 1 month.   - buPROPion (WELLBUTRIN SR) 150 MG 12 hr tablet; Take 1 tablet (150 mg total) by mouth 2 (two) times daily.  Dispense: 60 tablet; Refill: 0 - topiramate (TOPAMAX) 25 MG tablet; Take 1 tablet (25 mg total) by mouth daily.  Dispense: 30 tablet; Refill: 0  4. Obesity, Current BMI 33.2 Melissa Leach is currently in the action stage of change. As such, her goal is to continue with weight loss efforts. She has agreed to the Category 2 Plan.   Exercise goals: As is.   Behavioral modification strategies: increasing lean protein intake, travel eating strategies, and holiday eating strategies .  Melissa Leach has agreed to follow-up with our clinic in 4 weeks. She was informed of the importance of frequent follow-up visits to maximize her success with intensive lifestyle modifications for her multiple health conditions.   Objective:   Blood pressure 102/71, pulse 78, temperature 97.9 F (36.6 C), height 5\' 1"  (1.549 m), weight 175 lb 9.6 oz (79.7 kg), last menstrual period 12/27/2002, SpO2 100 %. Body mass index is 33.18 kg/m.  General: Cooperative, alert, well developed, in no acute distress. HEENT: Conjunctivae and lids unremarkable. Cardiovascular: Regular rhythm.  Lungs: Normal work of breathing. Neurologic: No focal deficits.   Lab Results  Component Value Date   CREATININE 1.17 (H) 12/11/2021   BUN 33 (H) 12/11/2021   NA 136 12/11/2021   K 4.0 12/11/2021   CL 93 (L) 12/11/2021   CO2 21 12/11/2021   Lab  Results  Component Value Date   ALT 16 12/11/2021   AST 25 12/11/2021   ALKPHOS 123 (H) 12/11/2021   BILITOT 0.3 12/11/2021   Lab Results  Component Value Date   HGBA1C 5.5 12/11/2021   HGBA1C 5.2 05/29/2021   HGBA1C 5.4 01/11/2021   HGBA1C 5.3 05/29/2020   HGBA1C 5.4 03/14/2020   Lab Results  Component Value Date   INSULIN 15.5 12/11/2021   INSULIN 9.1 01/11/2021   INSULIN 9.2 05/29/2020   INSULIN 8.3 11/01/2019   INSULIN 9.8  07/22/2019   Lab Results  Component Value Date   TSH 1.220 12/11/2021   Lab Results  Component Value Date   CHOL 146 12/11/2021   HDL 51 12/11/2021   LDLCALC 73 12/11/2021   TRIG 126 12/11/2021   Lab Results  Component Value Date   VD25OH 39.1 12/11/2021   VD25OH 65.3 01/11/2021   VD25OH 55.3 05/29/2020   Lab Results  Component Value Date   WBC 7.3 09/06/2021   HGB 13.8 09/06/2021   HCT 41.4 09/06/2021   MCV 93 09/06/2021   PLT 267 09/06/2021   No results found for: "IRON", "TIBC", "FERRITIN"  Attestation Statements:   Reviewed by clinician on day of visit: allergies, medications, problem list, medical history, surgical history, family history, social history, and previous encounter notes.  I have personally spent 45 minutes total time today in preparation, patient care, and documentation for this visit, including the following: review of clinical lab tests; review of medical tests/procedures/services.  I, Burt Knack, am acting as transcriptionist for Quillian Quince, MD.  I have reviewed the above documentation for accuracy and completeness, and I agree with the above. -  Quillian Quince, MD

## 2022-02-27 DIAGNOSIS — M25569 Pain in unspecified knee: Secondary | ICD-10-CM | POA: Diagnosis not present

## 2022-03-05 DIAGNOSIS — M25569 Pain in unspecified knee: Secondary | ICD-10-CM | POA: Diagnosis not present

## 2022-03-07 ENCOUNTER — Ambulatory Visit (INDEPENDENT_AMBULATORY_CARE_PROVIDER_SITE_OTHER): Payer: Medicare HMO | Admitting: Family Medicine

## 2022-03-14 ENCOUNTER — Ambulatory Visit: Payer: Medicare HMO | Admitting: Neurology

## 2022-03-18 ENCOUNTER — Ambulatory Visit: Payer: Medicare HMO | Admitting: Neurology

## 2022-03-19 ENCOUNTER — Encounter (INDEPENDENT_AMBULATORY_CARE_PROVIDER_SITE_OTHER): Payer: Self-pay | Admitting: Physician Assistant

## 2022-03-19 ENCOUNTER — Ambulatory Visit (INDEPENDENT_AMBULATORY_CARE_PROVIDER_SITE_OTHER): Payer: Medicare HMO | Admitting: Physician Assistant

## 2022-03-19 VITALS — BP 97/65 | HR 76 | Temp 97.8°F | Ht 61.0 in | Wt 177.0 lb

## 2022-03-19 DIAGNOSIS — F3289 Other specified depressive episodes: Secondary | ICD-10-CM | POA: Diagnosis not present

## 2022-03-19 DIAGNOSIS — E559 Vitamin D deficiency, unspecified: Secondary | ICD-10-CM

## 2022-03-19 DIAGNOSIS — E669 Obesity, unspecified: Secondary | ICD-10-CM

## 2022-03-19 DIAGNOSIS — R7303 Prediabetes: Secondary | ICD-10-CM | POA: Diagnosis not present

## 2022-03-19 DIAGNOSIS — Z6833 Body mass index (BMI) 33.0-33.9, adult: Secondary | ICD-10-CM

## 2022-03-19 DIAGNOSIS — R69 Illness, unspecified: Secondary | ICD-10-CM | POA: Diagnosis not present

## 2022-03-19 MED ORDER — METFORMIN HCL 500 MG PO TABS: 500.0000 mg | ORAL_TABLET | Freq: Every day | ORAL | 0 refills | Status: DC

## 2022-03-19 MED ORDER — VITAMIN D (ERGOCALCIFEROL) 1.25 MG (50000 UNIT) PO CAPS: ORAL_CAPSULE | ORAL | 0 refills | Status: DC

## 2022-03-19 MED ORDER — BUPROPION HCL ER (SR) 150 MG PO TB12: 150.0000 mg | ORAL_TABLET | Freq: Two times a day (BID) | ORAL | 0 refills | Status: DC

## 2022-03-19 MED ORDER — TOPIRAMATE 25 MG PO TABS: 25.0000 mg | ORAL_TABLET | Freq: Every day | ORAL | 0 refills | Status: DC

## 2022-03-20 ENCOUNTER — Encounter: Payer: Self-pay | Admitting: Neurology

## 2022-03-20 ENCOUNTER — Ambulatory Visit: Payer: Medicare HMO | Admitting: Neurology

## 2022-03-20 DIAGNOSIS — G43709 Chronic migraine without aura, not intractable, without status migrainosus: Secondary | ICD-10-CM | POA: Diagnosis not present

## 2022-03-20 MED ORDER — ONABOTULINUMTOXINA 100 UNITS IJ SOLR
155.0000 [IU] | Freq: Once | INTRAMUSCULAR | Status: AC
  Administered 2022-03-20: 155 [IU] via INTRAMUSCULAR

## 2022-03-20 NOTE — Progress Notes (Signed)
Consent Form Botulism Toxin Injection For Chronic Migraine  12/19/2021: stable 03/20/2022: stable  12/19/2021: stable 09/26/2021: stable. : Doing great, >>70% decrease migraines.   Reviewed orally with patient, additionally signature is on file:  Botulism toxin has been approved by the Federal drug administration for treatment of chronic migraine. Botulism toxin does not cure chronic migraine and it may not be effective in some patients.  The administration of botulism toxin is accomplished by injecting a small amount of toxin into the muscles of the neck and head. Dosage must be titrated for each individual. Any benefits resulting from botulism toxin tend to wear off after 3 months with a repeat injection required if benefit is to be maintained. Injections are usually done every 3-4 months with maximum effect peak achieved by about 2 or 3 weeks. Botulism toxin is expensive and you should be sure of what costs you will incur resulting from the injection.  The side effects of botulism toxin use for chronic migraine may include:   -Transient, and usually mild, facial weakness with facial injections  -Transient, and usually mild, head or neck weakness with head/neck injections  -Reduction or loss of forehead facial animation due to forehead muscle weakness  -Eyelid drooping  -Dry eye  -Pain at the site of injection or bruising at the site of injection  -Double vision  -Potential unknown long term risks  Contraindications: You should not have Botox if you are pregnant, nursing, allergic to albumin, have an infection, skin condition, or muscle weakness at the site of the injection, or have myasthenia gravis, Lambert-Eaton syndrome, or ALS.  It is also possible that as with any injection, there may be an allergic reaction or no effect from the medication. Reduced effectiveness after repeated injections is sometimes seen and rarely infection at the injection site may occur. All care will be taken to  prevent these side effects. If therapy is given over a long time, atrophy and wasting in the muscle injected may occur. Occasionally the patient's become refractory to treatment because they develop antibodies to the toxin. In this event, therapy needs to be modified.  I have read the above information and consent to the administration of botulism toxin.    BOTOX PROCEDURE NOTE FOR MIGRAINE HEADACHE    Contraindications and precautions discussed with patient(above). Aseptic procedure was observed and patient tolerated procedure. Procedure performed by Dr. Artemio Aly  The condition has existed for more than 6 months, and pt does not have a diagnosis of ALS, Myasthenia Gravis or Lambert-Eaton Syndrome.  Risks and benefits of injections discussed and pt agrees to proceed with the procedure.  Written consent obtained  These injections are medically necessary. Pt  receives good benefits from these injections. These injections do not cause sedations or hallucinations which the oral therapies may cause.  Description of procedure:  The patient was placed in a sitting position. The standard protocol was used for Botox as follows, with 5 units of Botox injected at each site:   -Procerus muscle, midline injection  -Corrugator muscle, bilateral injection  -Frontalis muscle, bilateral injection, with 2 sites each side, medial injection was performed in the upper one third of the frontalis muscle, in the region vertical from the medial inferior edge of the superior orbital rim. The lateral injection was again in the upper one third of the forehead vertically above the lateral limbus of the cornea, 1.5 cm lateral to the medial injection site.  -Temporalis muscle injection, 4 sites, bilaterally. The first injection was 3  cm above the tragus of the ear, second injection site was 1.5 cm to 3 cm up from the first injection site in line with the tragus of the ear. The third injection site was 1.5-3 cm forward  between the first 2 injection sites. The fourth injection site was 1.5 cm posterior to the second injection site.   -Occipitalis muscle injection, 3 sites, bilaterally. The first injection was done one half way between the occipital protuberance and the tip of the mastoid process behind the ear. The second injection site was done lateral and superior to the first, 1 fingerbreadth from the first injection. The third injection site was 1 fingerbreadth superiorly and medially from the first injection site.  -Cervical paraspinal muscle injection, 2 sites, bilateral knee first injection site was 1 cm from the midline of the cervical spine, 3 cm inferior to the lower border of the occipital protuberance. The second injection site was 1.5 cm superiorly and laterally to the first injection site.  -Trapezius muscle injection was performed at 3 sites, bilaterally. The first injection site was in the upper trapezius muscle halfway between the inflection point of the neck, and the acromion. The second injection site was one half way between the acromion and the first injection site. The third injection was done between the first injection site and the inflection point of the neck.   Will return for repeat injection in 3 months.   155 units of Botox was used, 45u Botox not injected was wasted. The patient tolerated the procedure well, there were no complications of the above procedure.

## 2022-03-20 NOTE — Progress Notes (Signed)
Botox- 100 units x 2 vials Lot: [id number] Expiration: 06/2024 NDC: 0086-7619-50  Bacteriostatic 0.9% Sodium Chloride- 68mL total Lot: [id number] Expiration: 12/01/2022 NDC: 2458-0998-33  Dx: A25.053 B/B

## 2022-03-26 DIAGNOSIS — M25569 Pain in unspecified knee: Secondary | ICD-10-CM | POA: Diagnosis not present

## 2022-04-03 DIAGNOSIS — M25569 Pain in unspecified knee: Secondary | ICD-10-CM | POA: Diagnosis not present

## 2022-04-04 NOTE — Progress Notes (Signed)
Chief Complaint:   OBESITY Melissa Leach is here to discuss her progress with her obesity treatment plan along with follow-up of her obesity related diagnoses. Christine is on the Category 2 Plan and states she is following her eating plan approximately 50% of the time. [name] states she is walking 15 minutes 7 times per week.  Today's visit was #: 89 Starting weight: 216 lbs Starting date: 06/06/2021 Today's weight: 177 lbs Today's date: 03/19/2022 Total lbs lost to date: 39 lbs Total lbs lost since last in-office visit: 0  Interim History: Melissa Leach has done well with weight loss. Did some celebration eating with recent trips to [place] and [place] to see family. Getting back on track with her prescribed nutrition plan.  Subjective:   1. Vitamin D deficiency [name] is taking ergocalciferol weekly. Last Vit D level of 39.1 on 12/11/21. No side effects.  2. Pre-diabetes A1c 5.5-at goal/insulin 15.5--not at goal on 12/11/21. Taking Metformin 500 mg daily--no side effects. Working on decreasing simple carbs and exercise to promote weight loss.  3. Other depression with emotional eating [name] is taking Wellbutrin 150 mg twice a day and Topamax 25 mg every night. No side effects with medications. Working on decreasing simple carbs, healthy eating, exercise to promote weight loss.  Assessment/Plan:   1. Vitamin D deficiency Continue/Refill ergocalciferol once weekly for 1 month with 0 refills.  -Refill Vitamin D, Ergocalciferol, (DRISDOL) 1.25 MG (50000 UNIT) CAPS capsule; TAKE 1 CAPSULE BY MOUTH ONE TIME PER WEEK  Dispense: 4 capsule; Refill: 0  2. Pre-diabetes Continue/Refill Metformin 500 mg daily for 1 month with 0 refills.  Continue healthy eating plan  to decrease simple carbohydrates, increase lean protein and exercise to promote weight loss.  Refill- metFORMIN (GLUCOPHAGE) 500 MG tablet; Take 1 tablet (500 mg total) by mouth daily with breakfast.  Dispense: 30 tablet; Refill:  0  3. Other depression with emotional eating Continue/Refill Wellbutrin SR 150 mg twice a day AND Continue/RefillTopamax 25 mg daily for 1 month with 0 refills.  -Refill buPROPion (WELLBUTRIN SR) 150 MG 12 hr tablet; Take 1 tablet (150 mg total) by mouth 2 (two) times daily.  Dispense: 60 tablet; Refill: 0  -Refill topiramate (TOPAMAX) 25 MG tablet; Take 1 tablet (25 mg total) by mouth daily.  Dispense: 30 tablet; Refill: 0  4. Obesity, Current BMI 33.6 Melissa Leach is currently in the action stage of change. As such, her goal is to continue with weight loss efforts. She has agreed to the Category 2 Plan.   Exercise goals: As is. Yoga chair.  Behavioral modification strategies: increasing lean protein intake, decreasing simple carbohydrates, emotional eating strategies, and holiday eating strategies .  [name] has agreed to follow-up with our clinic in 4 weeks. She was informed of the importance of frequent follow-up visits to maximize her success with intensive lifestyle modifications for her multiple health conditions.   Objective:   Blood pressure 97/65, pulse 76, temperature 97.8 F (36.6 C), height 5\' 1"  (1.549 m), weight 177 lb (80.3 kg), last menstrual period 12/27/2002, SpO2 100 %. Body mass index is 33.44 kg/m.  General: Cooperative, alert, well developed, in no acute distress. HEENT: Conjunctivae and lids unremarkable. Cardiovascular: Regular rhythm.  Lungs: Normal work of breathing. Neurologic: No focal deficits.   Lab Results  Component Value Date   CREATININE 1.17 (H) 12/11/2021   BUN 33 (H) 12/11/2021   NA 136 12/11/2021   K 4.0 12/11/2021   CL 93 (L) 12/11/2021   CO2 21  12/11/2021   Lab Results  Component Value Date   ALT 16 12/11/2021   AST 25 12/11/2021   ALKPHOS 123 (H) 12/11/2021   BILITOT 0.3 12/11/2021   Lab Results  Component Value Date   HGBA1C 5.5 12/11/2021   HGBA1C 5.2 05/29/2021   HGBA1C 5.4 01/11/2021   HGBA1C 5.3 05/29/2020   HGBA1C 5.4  03/14/2020   Lab Results  Component Value Date   INSULIN 15.5 12/11/2021   INSULIN 9.1 01/11/2021   INSULIN 9.2 05/29/2020   INSULIN 8.3 11/01/2019   INSULIN 9.8 07/22/2019   Lab Results  Component Value Date   TSH 1.220 12/11/2021   Lab Results  Component Value Date   CHOL 146 12/11/2021   HDL 51 12/11/2021   LDLCALC 73 12/11/2021   TRIG 126 12/11/2021   Lab Results  Component Value Date   VD25OH 39.1 12/11/2021   VD25OH 65.3 01/11/2021   VD25OH 55.3 05/29/2020   Lab Results  Component Value Date   WBC 7.3 09/06/2021   HGB 13.8 09/06/2021   HCT 41.4 09/06/2021   MCV 93 09/06/2021   PLT 267 09/06/2021   No results found for: "IRON", "TIBC", "FERRITIN"  Attestation Statements:   Reviewed by clinician on day of visit: allergies, medications, problem list, medical history, surgical history, family history, social history, and previous encounter notes.  I, Brendell Tyus, am acting as transcriptionist for [employer], PA.  I have reviewed the above documentation for accuracy and completeness, and I agree with the above. -  Ebubechukwu Jedlicka,PA-C

## 2022-04-12 ENCOUNTER — Other Ambulatory Visit (INDEPENDENT_AMBULATORY_CARE_PROVIDER_SITE_OTHER): Payer: Self-pay | Admitting: Physician Assistant

## 2022-04-12 DIAGNOSIS — F3289 Other specified depressive episodes: Secondary | ICD-10-CM

## 2022-04-15 DIAGNOSIS — M25561 Pain in right knee: Secondary | ICD-10-CM | POA: Diagnosis not present

## 2022-04-15 DIAGNOSIS — S8001XA Contusion of right knee, initial encounter: Secondary | ICD-10-CM | POA: Diagnosis not present

## 2022-04-15 DIAGNOSIS — Z96651 Presence of right artificial knee joint: Secondary | ICD-10-CM | POA: Diagnosis not present

## 2022-04-16 DIAGNOSIS — K219 Gastro-esophageal reflux disease without esophagitis: Secondary | ICD-10-CM | POA: Diagnosis not present

## 2022-04-16 DIAGNOSIS — E559 Vitamin D deficiency, unspecified: Secondary | ICD-10-CM | POA: Diagnosis not present

## 2022-04-16 DIAGNOSIS — I10 Essential (primary) hypertension: Secondary | ICD-10-CM | POA: Diagnosis not present

## 2022-04-16 DIAGNOSIS — M1990 Unspecified osteoarthritis, unspecified site: Secondary | ICD-10-CM | POA: Diagnosis not present

## 2022-04-16 DIAGNOSIS — L719 Rosacea, unspecified: Secondary | ICD-10-CM | POA: Diagnosis not present

## 2022-04-16 DIAGNOSIS — E119 Type 2 diabetes mellitus without complications: Secondary | ICD-10-CM | POA: Diagnosis not present

## 2022-04-16 DIAGNOSIS — R69 Illness, unspecified: Secondary | ICD-10-CM | POA: Diagnosis not present

## 2022-04-16 DIAGNOSIS — Z008 Encounter for other general examination: Secondary | ICD-10-CM | POA: Diagnosis not present

## 2022-04-16 DIAGNOSIS — E785 Hyperlipidemia, unspecified: Secondary | ICD-10-CM | POA: Diagnosis not present

## 2022-04-16 DIAGNOSIS — E039 Hypothyroidism, unspecified: Secondary | ICD-10-CM | POA: Diagnosis not present

## 2022-04-16 DIAGNOSIS — G43909 Migraine, unspecified, not intractable, without status migrainosus: Secondary | ICD-10-CM | POA: Diagnosis not present

## 2022-04-17 DIAGNOSIS — M25569 Pain in unspecified knee: Secondary | ICD-10-CM | POA: Diagnosis not present

## 2022-04-24 ENCOUNTER — Ambulatory Visit (INDEPENDENT_AMBULATORY_CARE_PROVIDER_SITE_OTHER): Payer: Medicare HMO | Admitting: Family Medicine

## 2022-04-24 ENCOUNTER — Encounter (INDEPENDENT_AMBULATORY_CARE_PROVIDER_SITE_OTHER): Payer: Self-pay | Admitting: Family Medicine

## 2022-04-24 VITALS — BP 95/63 | HR 80 | Temp 97.5°F | Ht 61.0 in | Wt 177.0 lb

## 2022-04-24 DIAGNOSIS — R69 Illness, unspecified: Secondary | ICD-10-CM | POA: Diagnosis not present

## 2022-04-24 DIAGNOSIS — Z6833 Body mass index (BMI) 33.0-33.9, adult: Secondary | ICD-10-CM | POA: Diagnosis not present

## 2022-04-24 DIAGNOSIS — F3289 Other specified depressive episodes: Secondary | ICD-10-CM

## 2022-04-24 DIAGNOSIS — M25569 Pain in unspecified knee: Secondary | ICD-10-CM | POA: Diagnosis not present

## 2022-04-24 DIAGNOSIS — E860 Dehydration: Secondary | ICD-10-CM | POA: Diagnosis not present

## 2022-04-24 DIAGNOSIS — E669 Obesity, unspecified: Secondary | ICD-10-CM | POA: Diagnosis not present

## 2022-04-24 DIAGNOSIS — E559 Vitamin D deficiency, unspecified: Secondary | ICD-10-CM

## 2022-04-24 DIAGNOSIS — R7303 Prediabetes: Secondary | ICD-10-CM | POA: Diagnosis not present

## 2022-04-24 MED ORDER — METFORMIN HCL 500 MG PO TABS: 500.0000 mg | ORAL_TABLET | Freq: Every day | ORAL | 0 refills | Status: DC

## 2022-04-24 MED ORDER — TOPIRAMATE 25 MG PO TABS: 25.0000 mg | ORAL_TABLET | Freq: Every day | ORAL | 0 refills | Status: DC

## 2022-04-24 MED ORDER — BUPROPION HCL ER (SR) 150 MG PO TB12: 150.0000 mg | ORAL_TABLET | Freq: Two times a day (BID) | ORAL | 0 refills | Status: DC

## 2022-04-24 MED ORDER — VITAMIN D (ERGOCALCIFEROL) 1.25 MG (50000 UNIT) PO CAPS: ORAL_CAPSULE | ORAL | 0 refills | Status: DC

## 2022-05-01 DIAGNOSIS — M25569 Pain in unspecified knee: Secondary | ICD-10-CM | POA: Diagnosis not present

## 2022-05-04 ENCOUNTER — Encounter (HOSPITAL_BASED_OUTPATIENT_CLINIC_OR_DEPARTMENT_OTHER): Payer: Self-pay

## 2022-05-04 ENCOUNTER — Emergency Department (HOSPITAL_BASED_OUTPATIENT_CLINIC_OR_DEPARTMENT_OTHER)
Admission: EM | Admit: 2022-05-04 | Discharge: 2022-05-04 | Disposition: A | Discharge status: Home / Self Care | Payer: Medicare HMO | Attending: Emergency Medicine | Admitting: Emergency Medicine

## 2022-05-04 ENCOUNTER — Emergency Department (HOSPITAL_BASED_OUTPATIENT_CLINIC_OR_DEPARTMENT_OTHER): Payer: Medicare HMO | Admitting: Radiology

## 2022-05-04 ENCOUNTER — Emergency Department (HOSPITAL_BASED_OUTPATIENT_CLINIC_OR_DEPARTMENT_OTHER): Payer: Medicare HMO

## 2022-05-04 ENCOUNTER — Other Ambulatory Visit: Payer: Self-pay

## 2022-05-04 DIAGNOSIS — S0083XA Contusion of other part of head, initial encounter: Secondary | ICD-10-CM | POA: Insufficient documentation

## 2022-05-04 DIAGNOSIS — W010XXA Fall on same level from slipping, tripping and stumbling without subsequent striking against object, initial encounter: Secondary | ICD-10-CM | POA: Insufficient documentation

## 2022-05-04 DIAGNOSIS — S199XXA Unspecified injury of neck, initial encounter: Secondary | ICD-10-CM | POA: Diagnosis not present

## 2022-05-04 DIAGNOSIS — S0003XA Contusion of scalp, initial encounter: Secondary | ICD-10-CM | POA: Diagnosis not present

## 2022-05-04 DIAGNOSIS — W19XXXA Unspecified fall, initial encounter: Secondary | ICD-10-CM

## 2022-05-04 DIAGNOSIS — S0993XA Unspecified injury of face, initial encounter: Secondary | ICD-10-CM | POA: Diagnosis not present

## 2022-05-04 DIAGNOSIS — R079 Chest pain, unspecified: Secondary | ICD-10-CM | POA: Diagnosis not present

## 2022-05-04 MED ORDER — HYDROCODONE-ACETAMINOPHEN 5-325 MG PO TABS: 1.0000 | ORAL_TABLET | Freq: Four times a day (QID) | ORAL | 0 refills | Status: DC | PRN

## 2022-05-04 MED ORDER — KETOROLAC TROMETHAMINE 15 MG/ML IJ SOLN
15.0000 mg | Freq: Once | INTRAMUSCULAR | Status: AC
  Administered 2022-05-04: 15 mg via INTRAMUSCULAR
  Filled 2022-05-04: qty 1

## 2022-05-04 NOTE — Discharge Instructions (Addendum)
Your workup today was reassuring.  CT scan of the head and neck did not show any concerning findings.  Chest x-ray did not show any evidence of fracture.  You have a hematoma to the right side of your forehead, and a skin tear to the left.  You can apply Neosporin over the skin tear.  Over the hematoma you can apply cold compress.  Take Tylenol as you need to for pain control.  I have also sent in a pain medication that you can use.  No fractures of your ribs.  However you might of bruised a rib given how hard she fell.  This can be painful.  Continue to take deep breaths.  Has not taken deep breaths can lead to an environment that can allow pneumonia in the form.  Any concerning symptoms return to the emergency department otherwise follow-up with your primary care provider.

## 2022-05-04 NOTE — ED Triage Notes (Signed)
Patient here POV from Home.  Endorses Fall today approximately 30-60 Minutes ago when she was ambulating in her garage and tripped into the [employer].  Abrasion to Forehead with associated Bruising/Swelling. No LOC. No Anticoagulants. Pain to Right Torso as well.   NAD Noted during Triage. A&Ox4. GCS 15. Ambulatory.

## 2022-05-04 NOTE — ED Provider Notes (Signed)
[facility] Provider Note   CSN: [id number] Arrival date & time: 05/04/22  2027     History  Chief Complaint  Patient presents with   Melissa Leach    Melissa Leach is a 72 y.o. female.  72 year old female presents following a fall.  She states that her was a fold in her carpet in the garage that she tripped over causing her to fall.  No prodromal symptoms.  No loss of consciousness.  She has a hematoma to her forehead, skin tear to the left side of her forehead.  Also complains of right lower rib cage pain.  Not on anticoagulation.  Denies any other joint pain.  The history is provided by the patient. No language interpreter was used.       Home Medications Prior to Admission medications   Medication Sig Start Date End Date Taking? Authorizing Provider  atorvastatin (LIPITOR) 40 MG tablet Take 40 mg by mouth every evening.    [provider]  buPROPion (WELLBUTRIN SR) 150 MG 12 hr tablet Take 1 tablet (150 mg total) by mouth 2 (two) times daily. 04/24/22   Dennard Nip D, MD  Calcium Carb-Cholecalciferol (CALCIUM 600+D3 PO) Take 1 tablet by mouth daily.    [provider]  cetirizine (ZYRTEC) 10 MG tablet Take 10 mg by mouth daily.    [provider]  clobetasol ointment (TEMOVATE) 4.88 % Apply 1 application topically 2 (two) times daily. 05/10/21   Chrzanowski, Jami B, NP  doxycycline (VIBRAMYCIN) 50 MG capsule Take 50 mg by mouth daily.    [provider]  levothyroxine (SYNTHROID) 75 MCG tablet Take 75 mcg by mouth daily before breakfast.    [provider]  meloxicam (MOBIC) 15 MG tablet Take 15 mg by mouth daily.    [provider]  metFORMIN (GLUCOPHAGE) 500 MG tablet Take 1 tablet (500 mg total) by mouth daily with breakfast. 04/24/22   Dennard Nip D, MD  Multiple Vitamin (MULTIVITAMIN WITH MINERALS) TABS Take 1 tablet by mouth daily.    [provider]  Multiple  Vitamins-Minerals (PRESERVISION AREDS 2+MULTI VIT PO) Take 1 capsule by mouth 2 (two) times daily.    [provider]  pantoprazole (PROTONIX) 40 MG tablet Take 40 mg by mouth daily.    [provider]  sertraline (ZOLOFT) 50 MG tablet Take 100 mg by mouth daily.    [provider]  SUMAtriptan (IMITREX) 100 MG tablet Take 100 mg by mouth every 2 (two) hours as needed for migraine.    [provider]  topiramate (TOPAMAX) 25 MG tablet Take 1 tablet (25 mg total) by mouth daily. 04/24/22   Dennard Nip D, MD  Vitamin D, Ergocalciferol, (DRISDOL) 1.25 MG (50000 UNIT) CAPS capsule TAKE 1 CAPSULE BY MOUTH ONE TIME PER WEEK 04/24/22   Dennard Nip D, MD      Allergies    Ceftriaxone and Sulfa antibiotics    Review of Systems   Review of Systems  Eyes:  Negative for visual disturbance.  Musculoskeletal:  Negative for arthralgias and neck pain.  Neurological:  Positive for headaches.  All other systems reviewed and are negative.   Physical Exam Updated Vital Signs BP (!) 140/73 (BP Location: Right Arm)   Pulse 82   Temp (!) 96.2 F (35.7 C) (Temporal)   Resp 18   Ht 5\' 1"  (1.549 m)   Wt 80.3 kg   LMP 12/27/2002   SpO2 100%  BMI 33.45 kg/m  Physical Exam Vitals and nursing note reviewed.  Constitutional:      General: She is not in acute distress.    Appearance: Normal appearance. She is not ill-appearing.  HENT:     Head: Normocephalic and atraumatic.     Comments: Bruising and hematoma noted to right side of forehead.  Skin tear noted to left side.    Nose: Nose normal.  Eyes:     General: No scleral icterus.    Extraocular Movements: Extraocular movements intact.     Conjunctiva/sclera: Conjunctivae normal.  Cardiovascular:     Rate and Rhythm: Normal rate and regular rhythm.     Pulses: Normal pulses.  Pulmonary:     Effort: Pulmonary effort is normal. No respiratory distress.     Breath sounds: Normal breath sounds. No wheezing or  rales.  Abdominal:     General: There is no distension.     Palpations: Abdomen is soft.     Tenderness: There is no abdominal tenderness. There is no guarding.  Musculoskeletal:        General: Normal range of motion.     Cervical back: Normal range of motion.     Comments: Cervical, thoracic, lumbar spine without tenderness palpation.  Full range of motion of bilateral upper and lower extremities with 5/5 strength in extensor and flexor muscle groups.  Full range of motion in all major extremities without tenderness to palpation.  Minimal abrasion noted to dorsal aspect of left hand.  No laceration that would require repair.  2+ DP pulse and 2+ radial pulse present.  Skin:    General: Skin is warm and dry.  Neurological:     General: No focal deficit present.     Mental Status: She is alert. Mental status is at baseline.     ED Results / Procedures / Treatments   Labs (all labs ordered are listed, but only abnormal results are displayed) Labs Reviewed - No data to display  EKG None  Radiology DG Chest 2 View  Result Date: 05/04/2022 CLINICAL DATA:  Right lower ribcage pain after a fall. EXAM: CHEST - 2 VIEW COMPARISON:  04/20/2014 FINDINGS: Normal heart size and pulmonary vascularity. No focal airspace disease or consolidation in the lungs. No blunting of costophrenic angles. No pneumothorax. Mediastinal contours appear intact. Postoperative changes in both shoulders. Calcification of the aorta. Degenerative changes in the spine. IMPRESSION: No active cardiopulmonary disease. Electronically Signed   By: Lucienne Capers M.D.   On: 05/04/2022 22:06   CT Cervical Spine Wo Contrast  Result Date: 05/04/2022 CLINICAL DATA:  Poly trauma, blunt. Bruising and swelling after a fall. EXAM: CT CERVICAL SPINE WITHOUT CONTRAST TECHNIQUE: Multidetector CT imaging of the cervical spine was performed without intravenous contrast. Multiplanar CT image reconstructions were also generated. RADIATION  DOSE REDUCTION: This exam was performed according to the departmental dose-optimization program which includes automated exposure control, adjustment of the mA and/or kV according to patient size and/or use of iterative reconstruction technique. COMPARISON:  None Available. FINDINGS: Alignment: Straightening of usual cervical lordosis. Slight anterior subluxation of C7 on T1. C1-2 articulation appears intact. Skull base and vertebrae: The skull base appears intact. No vertebral compression deformities. No focal bone lesions. Soft tissues and spinal canal: No prevertebral soft tissue swelling. No abnormal paraspinal soft tissue mass or infiltration. Disc levels: Degenerative changes with disc space narrowing and endplate osteophyte formation throughout but most prominent at C4-5 C5-6 and C6-7 levels. Degenerative changes in the posterior  facet joints. Uncovertebral spurring causes some bone encroachment upon neural foramina bilaterally. Upper chest: Emphysematous changes in the lung apices. Other: None. IMPRESSION: 1. Nonspecific straightening of usual cervical lordosis with slight anterior subluxation at C7-T1. Changes may be positional or degenerative but muscle spasm or ligamentous injury could also have this appearance and are not excluded. Correlate with physical examination. 2. Moderate degenerative changes throughout the cervical spine. 3. No acute displaced fractures are identified. Electronically Signed   By: Lucienne Capers M.D.   On: 05/04/2022 22:05   CT Head Wo Contrast  Result Date: 05/04/2022 CLINICAL DATA:  Poly trauma, blunt. Fell today. Trip and fall injury. Abrasion to the forehead with bruising and swelling. No loss of consciousness. No anticoagulation. EXAM: CT HEAD WITHOUT CONTRAST TECHNIQUE: Contiguous axial images were obtained from the base of the skull through the vertex without intravenous contrast. RADIATION DOSE REDUCTION: This exam was performed according to the departmental  dose-optimization program which includes automated exposure control, adjustment of the mA and/or kV according to patient size and/or use of iterative reconstruction technique. COMPARISON:  None Available. FINDINGS: Brain: Diffuse cerebral atrophy. Ventricular dilatation consistent with central atrophy. Low-attenuation changes in the deep white matter consistent with small vessel ischemia. No abnormal extra-axial fluid collections. No mass effect or midline shift. Gray-white matter junctions are distinct. Basal cisterns are not effaced. No acute intracranial hemorrhage. Vascular: No hyperdense vessel or unexpected calcification. Skull: Calvarium appears intact. Sinuses/Orbits: Paranasal sinuses and mastoid air cells are clear. Other: Small subcutaneous scalp hematoma over the right anterior frontal region. IMPRESSION: No acute intracranial abnormalities. Chronic atrophy and small vessel ischemic changes. Electronically Signed   By: Lucienne Capers M.D.   On: 05/04/2022 22:01    Procedures Procedures    Medications Ordered in ED Medications  ketorolac (TORADOL) 15 MG/ML injection 15 mg (15 mg Intramuscular Given 05/04/22 2136)    ED Course/ Medical Decision Making/ A&P Clinical Course as of 05/04/22 2221  Sat May 04, 2022  2214 No neck pain, Fall with facial hematoma HDS NAD [CC]    Clinical Course User Index [CC] Tretha Sciara, MD                             Medical Decision Making Amount and/or Complexity of Data Reviewed Radiology: ordered.  Risk Prescription drug management.   72 year old female presents today for evaluation following a fall.  Concern for intracranial injury.  Hematoma present.  Will obtain CT head, and cervical spine.  Will obtain chest x-ray given the chest wall pain and tenderness to palpation.  She is able to take a deep breath without difficulty.  No bruising over her chest wall.  Exam otherwise reassuring.  Will give Toradol for pain control. Imaging without  acute finding.  Patient is appropriate for discharge.  Discharged in stable condition.  Return precaution discussed.  Patient given short course of Norco.  Discussed to be extra cautious given this can make her drowsy. Patient discussed with attending who is in agreement with plan.   Final Clinical Impression(s) / ED Diagnoses Final diagnoses:  Fall, initial encounter  Traumatic hematoma of forehead, initial encounter    Rx / DC Orders ED Discharge Orders          Ordered    HYDROcodone-acetaminophen (NORCO/VICODIN) 5-325 MG tablet  Every 6 hours PRN        05/04/22 2231  Marita [place], PA-C 05/04/22 2236    Glyn Ade, MD 05/05/22 1504

## 2022-05-08 NOTE — Progress Notes (Unsigned)
Chief Complaint:   OBESITY Melissa Leach is here to discuss her progress with her obesity treatment plan along with follow-up of her obesity related diagnoses. Melissa Leach is on the Category 2 Plan and states she is following her eating plan approximately 30% of the time. [name] states she is walking for 20 minutes 7 times per week.  Today's visit was #: 53 Starting weight: 216 lbs Starting date: 06/06/2021 Today's weight: 177 lbs Today's date: 04/24/2022 Total lbs lost to date: 39 Total lbs lost since last in-office visit: 0  Interim History: Saroya did well with maintaining her weight, but she has not been following her plan.  She is frustrated with herself and she has questions about IF, Rybelsus, and compounded GLP-1.   Subjective:   1. Pre-diabetes Melissa Leach continues to work on decreasing simple carbohydrates but she is struggling with this.  2. Vitamin D deficiency Melissa Leach's vitamin D level is stable on prescription vitamin D.  She requests a refill today.  3. Dehydration Melissa Leach admits to not drinking much water.  Her last creatinine, BUN, and GFR showed dehydration with some renal insufficiency.  I discussed labs with the patient today.  4. Emotional Eating Behavior Melissa Leach is working on decreasing emotional eating behaviors.  She has indulged more this last month but she has managed to not gain weight.  Assessment/Plan:   1. Pre-diabetes Melissa Leach will continue metformin, and we will refill for 1 month.  - metFORMIN (GLUCOPHAGE) 500 MG tablet; Take 1 tablet (500 mg total) by mouth daily with breakfast.  Dispense: 30 tablet; Refill: 0  2. Vitamin D deficiency Melissa Leach will continue prescription vitamin D, and we will refill for 1 month.  - Vitamin D, Ergocalciferol, (DRISDOL) 1.25 MG (50000 UNIT) CAPS capsule; TAKE 1 CAPSULE BY MOUTH ONE TIME PER WEEK  Dispense: 4 capsule; Refill: 0  3. Dehydration Melissa Leach was encouraged to increase her water intake, and we will recheck  labs in 1 month.  4. Emotional Eating Behavior Melissa Leach will continue her medications, and we will refill Wellbutrin SR and Topamax for 1 month.  Emotional eating behavior strategies were discussed.  - buPROPion (WELLBUTRIN SR) 150 MG 12 hr tablet; Take 1 tablet (150 mg total) by mouth 2 (two) times daily.  Dispense: 60 tablet; Refill: 0 - topiramate (TOPAMAX) 25 MG tablet; Take 1 tablet (25 mg total) by mouth daily.  Dispense: 30 tablet; Refill: 0  5. BMI 33.0-33.9,adult  6. Obesity, Beginning BMI 40.81 Melissa Leach is currently in the action stage of change. As such, her goal is to continue with weight loss efforts. She has agreed to the Category 2 Plan.   We will recheck fasting labs at her next visit.  We discussed her questions in depth and [employer] strongly discouraged compounded GLP-1.  She is not diabetic so Rybelsus is not covered by insurance.  If she often decreases protein it would likely decrease her RMR.  Exercise goals: For substantial health benefits, adults should do at least 150 minutes (2 hours and 30 minutes) a week of moderate-intensity, or 75 minutes (1 hour and 15 minutes) a week of vigorous-intensity aerobic physical activity, or an equivalent combination of moderate- and vigorous-intensity aerobic activity. Aerobic activity should be performed in episodes of at least 10 minutes, and preferably, it should be spread throughout the week.  Behavioral modification strategies: increasing lean protein intake.  Melissa Leach has agreed to follow-up with our clinic in 4 weeks. She was informed of the importance of frequent follow-up visits to maximize  her success with intensive lifestyle modifications for her multiple health conditions.   Objective:   Blood pressure 95/63, pulse 80, temperature (!) 97.5 F (36.4 C), height 5' 1"$  (1.549 m), weight 177 lb (80.3 kg), last menstrual period 12/27/2002, SpO2 98 %. Body mass index is 33.44 kg/m.  General: Cooperative, alert, well developed,  in no acute distress. HEENT: Conjunctivae and lids unremarkable. Cardiovascular: Regular rhythm.  Lungs: Normal work of breathing. Neurologic: No focal deficits.   Lab Results  Component Value Date   CREATININE 1.17 (H) 12/11/2021   BUN 33 (H) 12/11/2021   NA 136 12/11/2021   K 4.0 12/11/2021   CL 93 (L) 12/11/2021   CO2 21 12/11/2021   Lab Results  Component Value Date   ALT 16 12/11/2021   AST 25 12/11/2021   ALKPHOS 123 (H) 12/11/2021   BILITOT 0.3 12/11/2021   Lab Results  Component Value Date   HGBA1C 5.5 12/11/2021   HGBA1C 5.2 05/29/2021   HGBA1C 5.4 01/11/2021   HGBA1C 5.3 05/29/2020   HGBA1C 5.4 03/14/2020   Lab Results  Component Value Date   INSULIN 15.5 12/11/2021   INSULIN 9.1 01/11/2021   INSULIN 9.2 05/29/2020   INSULIN 8.3 11/01/2019   INSULIN 9.8 07/22/2019   Lab Results  Component Value Date   TSH 1.220 12/11/2021   Lab Results  Component Value Date   CHOL 146 12/11/2021   HDL 51 12/11/2021   LDLCALC 73 12/11/2021   TRIG 126 12/11/2021   Lab Results  Component Value Date   VD25OH 39.1 12/11/2021   VD25OH 65.3 01/11/2021   VD25OH 55.3 05/29/2020   Lab Results  Component Value Date   WBC 7.3 09/06/2021   HGB 13.8 09/06/2021   HCT 41.4 09/06/2021   MCV 93 09/06/2021   PLT 267 09/06/2021   No results found for: "IRON", "TIBC", "FERRITIN"  Attestation Statements:   Reviewed by clinician on day of visit: allergies, medications, problem list, medical history, surgical history, family history, social history, and previous encounter notes.  Time spent on visit including pre-visit chart review and post-visit care and charting was 40 minutes.   I, Trixie Dredge, am acting as transcriptionist for Dennard Nip, MD.  I have reviewed the above documentation for accuracy and completeness, and I agree with the above. -  Dennard Nip, MD

## 2022-05-09 DIAGNOSIS — M25569 Pain in unspecified knee: Secondary | ICD-10-CM | POA: Diagnosis not present

## 2022-05-13 DIAGNOSIS — E119 Type 2 diabetes mellitus without complications: Secondary | ICD-10-CM | POA: Diagnosis not present

## 2022-05-14 DIAGNOSIS — Z0100 Encounter for examination of eyes and vision without abnormal findings: Secondary | ICD-10-CM | POA: Diagnosis not present

## 2022-05-15 DIAGNOSIS — M25569 Pain in unspecified knee: Secondary | ICD-10-CM | POA: Diagnosis not present

## 2022-05-22 ENCOUNTER — Ambulatory Visit (INDEPENDENT_AMBULATORY_CARE_PROVIDER_SITE_OTHER): Payer: Medicare HMO | Admitting: Family Medicine

## 2022-05-22 ENCOUNTER — Encounter (INDEPENDENT_AMBULATORY_CARE_PROVIDER_SITE_OTHER): Payer: Self-pay | Admitting: Family Medicine

## 2022-05-22 VITALS — BP 103/66 | HR 64 | Temp 97.9°F | Ht 61.0 in | Wt 183.0 lb

## 2022-05-22 DIAGNOSIS — E669 Obesity, unspecified: Secondary | ICD-10-CM

## 2022-05-22 DIAGNOSIS — F3289 Other specified depressive episodes: Secondary | ICD-10-CM

## 2022-05-22 DIAGNOSIS — E039 Hypothyroidism, unspecified: Secondary | ICD-10-CM | POA: Diagnosis not present

## 2022-05-22 DIAGNOSIS — E7800 Pure hypercholesterolemia, unspecified: Secondary | ICD-10-CM | POA: Diagnosis not present

## 2022-05-22 DIAGNOSIS — E559 Vitamin D deficiency, unspecified: Secondary | ICD-10-CM | POA: Diagnosis not present

## 2022-05-22 DIAGNOSIS — Z6834 Body mass index (BMI) 34.0-34.9, adult: Secondary | ICD-10-CM | POA: Diagnosis not present

## 2022-05-22 DIAGNOSIS — R7303 Prediabetes: Secondary | ICD-10-CM

## 2022-05-22 DIAGNOSIS — R69 Illness, unspecified: Secondary | ICD-10-CM | POA: Diagnosis not present

## 2022-05-22 MED ORDER — BUPROPION HCL ER (SR) 150 MG PO TB12: 150.0000 mg | ORAL_TABLET | Freq: Two times a day (BID) | ORAL | 0 refills | Status: DC

## 2022-05-22 MED ORDER — VITAMIN D (ERGOCALCIFEROL) 1.25 MG (50000 UNIT) PO CAPS: ORAL_CAPSULE | ORAL | 0 refills | Status: DC

## 2022-05-22 MED ORDER — METFORMIN HCL 500 MG PO TABS: 500.0000 mg | ORAL_TABLET | Freq: Every day | ORAL | 0 refills | Status: DC

## 2022-05-22 MED ORDER — TOPIRAMATE 25 MG PO TABS: 25.0000 mg | ORAL_TABLET | Freq: Every day | ORAL | 0 refills | Status: DC

## 2022-05-23 LAB — CMP14+EGFR
ALT: 15 IU/L (ref 0–32)
AST: 22 IU/L (ref 0–40)
Albumin/Globulin Ratio: 1.9 (ref 1.2–2.2)
Albumin: 4.5 g/dL (ref 3.9–4.9)
Alkaline Phosphatase: 132 IU/L — ABNORMAL HIGH (ref 44–121)
BUN/Creatinine Ratio: 30 — ABNORMAL HIGH (ref 12–28)
BUN: 27 mg/dL (ref 8–27)
Bilirubin Total: 0.3 mg/dL (ref 0.0–1.2)
CO2: 21 mmol/L (ref 20–29)
Calcium: 9.6 mg/dL (ref 8.7–10.3)
Chloride: 104 mmol/L (ref 96–106)
Creatinine, Ser: 0.9 mg/dL (ref 0.57–1.00)
Globulin, Total: 2.4 g/dL (ref 1.5–4.5)
Glucose: 81 mg/dL (ref 70–99)
Potassium: 4.5 mmol/L (ref 3.5–5.2)
Sodium: 138 mmol/L (ref 134–144)
Total Protein: 6.9 g/dL (ref 6.0–8.5)
eGFR: 69 mL/min/{1.73_m2} (ref >59)

## 2022-05-23 LAB — HEMOGLOBIN A1C
Est. average glucose Bld gHb Est-mCnc: 100 mg/dL
Hgb A1c MFr Bld: 5.1 % (ref 4.8–5.6)

## 2022-05-23 LAB — LIPID PANEL WITH LDL/HDL RATIO
Cholesterol, Total: 134 mg/dL (ref 100–199)
HDL: 53 mg/dL (ref >39)
LDL Chol Calc (NIH): 59 mg/dL (ref 0–99)
LDL/HDL Ratio: 1.1 ratio (ref 0.0–3.2)
Triglycerides: 126 mg/dL (ref 0–149)
VLDL Cholesterol Cal: 22 mg/dL (ref 5–40)

## 2022-05-23 LAB — VITAMIN B12: Vitamin B-12: 1359 pg/mL — ABNORMAL HIGH (ref 232–1245)

## 2022-05-23 LAB — INSULIN, RANDOM: INSULIN: 6.1 u[IU]/mL (ref 2.6–24.9)

## 2022-05-23 LAB — VITAMIN D 25 HYDROXY (VIT D DEFICIENCY, FRACTURES): Vit D, 25-Hydroxy: 55.9 ng/mL (ref 30.0–100.0)

## 2022-05-23 LAB — TSH: TSH: 2.68 u[IU]/mL (ref 0.450–4.500)

## 2022-05-24 DIAGNOSIS — M25569 Pain in unspecified knee: Secondary | ICD-10-CM | POA: Diagnosis not present

## 2022-05-29 DIAGNOSIS — M25569 Pain in unspecified knee: Secondary | ICD-10-CM | POA: Diagnosis not present

## 2022-06-01 DIAGNOSIS — M25569 Pain in unspecified knee: Secondary | ICD-10-CM | POA: Diagnosis not present

## 2022-06-04 NOTE — Progress Notes (Signed)
Chief Complaint:   OBESITY Melissa Leach is here to discuss her progress with her obesity treatment plan along with follow-up of her obesity related diagnoses. Melissa Leach is on the Category 2 Plan and states she is following her eating plan approximately 30% of the time. Melissa Leach states she is doing 0 minutes 0 times per week.  Today's visit was #: 75 Starting weight: 216 lbs Starting date: 04/08/2019 Today's weight: 183 lbs Today's date: 05/22/2022 Total lbs lost to date: 33 Total lbs lost since last in-office visit: 0  Interim History: Melissa Leach has struggled to stay on track in the last month.  She is seeing physical therapy and she is ready to work on adding chair yoga.  She is retaining some fluid as well, but she is ready to get back on track with a structured eating plan.  Subjective:   1. Pure hypercholesterolemia Melissa Leach is doing well on Lipitor.  She is due to have labs rechecked.  2. Vitamin D deficiency Melissa Leach is stable on vitamin D with no nausea, vomiting, or muscle weakness.  3. Pre-diabetes Melissa Leach is doing well on metformin with no hypoglycemia noted.  She has struggled a bit more with her eating plan lately.  4. Acquired hypothyroidism Melissa Leach is on Synthroid.  No palpitations or tremors were noted.  5. Emotional Eating Behavior Melissa Leach is working on decreasing emotional eating behaviors.  She is doing well with her medications, and her blood pressure is controlled.  Assessment/Plan:   1. Pure hypercholesterolemia We will check labs today.  Melissa Leach will continue to work on decreasing cholesterol in her diet.  - Lipid Panel With LDL/HDL Ratio  2. Vitamin D deficiency We will check labs today, and we will refill prescription vitamin D for 1 month.  - VITAMIN D 25 Hydroxy (Vit-D Deficiency, Fractures) - Vitamin D, Ergocalciferol, (DRISDOL) 1.25 MG (50000 UNIT) CAPS capsule; TAKE 1 CAPSULE BY MOUTH ONE TIME PER WEEK  Dispense: 4 capsule; Refill: 0  3.  Pre-diabetes We will check labs today.  Melissa Leach will continue metformin, and we will refill for 1 month.  - Vitamin B12 - CMP14+EGFR - Insulin, random - Hemoglobin A1c - metFORMIN (GLUCOPHAGE) 500 MG tablet; Take 1 tablet (500 mg total) by mouth daily with breakfast.  Dispense: 30 tablet; Refill: 0  4. Acquired hypothyroidism We will check labs today.  Melissa Leach may need her medication dose adjusted as her weight changes.  - TSH  5. Emotional Eating Behavior Melissa Leach will continue her medications, and we will refill Wellbutrin SR and Topamax for 1 month.  - buPROPion (WELLBUTRIN SR) 150 MG 12 hr tablet; Take 1 tablet (150 mg total) by mouth 2 (two) times daily.  Dispense: 60 tablet; Refill: 0 - topiramate (TOPAMAX) 25 MG tablet; Take 1 tablet (25 mg total) by mouth daily.  Dispense: 30 tablet; Refill: 0  6. BMI 34.0-34.9,adult  7. Obesity, Beginning BMI 40.81 Melissa Leach is currently in the action stage of change. As such, her goal is to continue with weight loss efforts. She has agreed to the Category 2 Plan or change to following a lower carbohydrate, vegetable and lean protein rich diet plan.   Exercise goals: Add chair yoga to her physical therapy exercises.   Behavioral modification strategies: increasing lean protein intake.  Melissa Leach has agreed to follow-up with our clinic in 4 weeks. She was informed of the importance of frequent follow-up visits to maximize her success with intensive lifestyle modifications for her multiple health conditions.   Melissa Leach was informed  we would discuss her lab results at her next visit unless there is a critical issue that needs to be addressed sooner. Melissa Leach agreed to keep her next visit at the agreed upon time to discuss these results.  Objective:   Blood pressure 103/66, pulse 64, temperature 97.9 F (36.6 C), height '5\' 1"'$  (1.549 m), weight 183 lb (83 kg), last menstrual period 12/27/2002, SpO2 98 %. Body mass index is 34.58 kg/m.  Lab  Results  Component Value Date   CREATININE 0.90 05/22/2022   BUN 27 05/22/2022   NA 138 05/22/2022   K 4.5 05/22/2022   CL 104 05/22/2022   CO2 21 05/22/2022   Lab Results  Component Value Date   ALT 15 05/22/2022   AST 22 05/22/2022   ALKPHOS 132 (H) 05/22/2022   BILITOT 0.3 05/22/2022   Lab Results  Component Value Date   HGBA1C 5.1 05/22/2022   HGBA1C 5.5 12/11/2021   HGBA1C 5.2 05/29/2021   HGBA1C 5.4 01/11/2021   HGBA1C 5.3 05/29/2020   Lab Results  Component Value Date   INSULIN 6.1 05/22/2022   INSULIN 15.5 12/11/2021   INSULIN 9.1 01/11/2021   INSULIN 9.2 05/29/2020   INSULIN 8.3 11/01/2019   Lab Results  Component Value Date   TSH 2.680 05/22/2022   Lab Results  Component Value Date   CHOL 134 05/22/2022   HDL 53 05/22/2022   LDLCALC 59 05/22/2022   TRIG 126 05/22/2022   Lab Results  Component Value Date   VD25OH 55.9 05/22/2022   VD25OH 39.1 12/11/2021   VD25OH 65.3 01/11/2021   Lab Results  Component Value Date   WBC 7.3 09/06/2021   HGB 13.8 09/06/2021   HCT 41.4 09/06/2021   MCV 93 09/06/2021   PLT 267 09/06/2021   No results found for: "IRON", "TIBC", "FERRITIN"  Attestation Statements:   Reviewed by clinician on day of visit: allergies, medications, problem list, medical history, surgical history, family history, social history, and previous encounter notes.   I, Trixie Dredge, am acting as transcriptionist for Dennard Nip, MD.  I have reviewed the above documentation for accuracy and completeness, and I agree with the above. -  Dennard Nip, MD

## 2022-06-05 DIAGNOSIS — M25569 Pain in unspecified knee: Secondary | ICD-10-CM | POA: Diagnosis not present

## 2022-06-12 DIAGNOSIS — M25569 Pain in unspecified knee: Secondary | ICD-10-CM | POA: Diagnosis not present

## 2022-06-17 ENCOUNTER — Ambulatory Visit: Payer: Medicare HMO | Admitting: Neurology

## 2022-06-17 DIAGNOSIS — G43709 Chronic migraine without aura, not intractable, without status migrainosus: Secondary | ICD-10-CM

## 2022-06-17 MED ORDER — ONABOTULINUMTOXINA 100 UNITS IJ SOLR
155.0000 [IU] | Freq: Once | INTRAMUSCULAR | Status: AC
  Administered 2022-06-17: 155 [IU] via INTRAMUSCULAR

## 2022-06-17 NOTE — Progress Notes (Signed)
Botox- 100 units x 2 vials Lot: [id number] Expiration: 07/2024 NDC: [id number]  Bacteriostatic 0.9% Sodium Chloride- 4 mL total Lot: [id number] Expiration: 01/2024 NDC: [id number]  Dx: [id number]  B/B Witnessed by Claiborne Rigg

## 2022-06-19 ENCOUNTER — Ambulatory Visit (INDEPENDENT_AMBULATORY_CARE_PROVIDER_SITE_OTHER): Payer: Medicare HMO | Admitting: Family Medicine

## 2022-06-19 ENCOUNTER — Encounter (INDEPENDENT_AMBULATORY_CARE_PROVIDER_SITE_OTHER): Payer: Self-pay | Admitting: Family Medicine

## 2022-06-19 VITALS — BP 95/67 | HR 76 | Temp 98.0°F | Ht 61.0 in | Wt 179.0 lb

## 2022-06-19 DIAGNOSIS — Z6833 Body mass index (BMI) 33.0-33.9, adult: Secondary | ICD-10-CM

## 2022-06-19 DIAGNOSIS — F3289 Other specified depressive episodes: Secondary | ICD-10-CM | POA: Diagnosis not present

## 2022-06-19 DIAGNOSIS — E559 Vitamin D deficiency, unspecified: Secondary | ICD-10-CM | POA: Diagnosis not present

## 2022-06-19 DIAGNOSIS — R7303 Prediabetes: Secondary | ICD-10-CM | POA: Diagnosis not present

## 2022-06-19 DIAGNOSIS — E669 Obesity, unspecified: Secondary | ICD-10-CM | POA: Diagnosis not present

## 2022-06-19 DIAGNOSIS — R69 Illness, unspecified: Secondary | ICD-10-CM | POA: Diagnosis not present

## 2022-06-19 MED ORDER — TOPIRAMATE 25 MG PO TABS: 25.0000 mg | ORAL_TABLET | Freq: Every day | ORAL | 0 refills | Status: DC

## 2022-06-19 MED ORDER — VITAMIN D (ERGOCALCIFEROL) 1.25 MG (50000 UNIT) PO CAPS: ORAL_CAPSULE | ORAL | 0 refills | Status: DC

## 2022-06-19 MED ORDER — METFORMIN HCL 500 MG PO TABS: 500.0000 mg | ORAL_TABLET | Freq: Every day | ORAL | 0 refills | Status: DC

## 2022-06-19 MED ORDER — BUPROPION HCL ER (SR) 150 MG PO TB12: 150.0000 mg | ORAL_TABLET | Freq: Two times a day (BID) | ORAL | 0 refills | Status: DC

## 2022-06-20 DIAGNOSIS — Z23 Encounter for immunization: Secondary | ICD-10-CM | POA: Diagnosis not present

## 2022-06-24 NOTE — Progress Notes (Unsigned)
Chief Complaint:   OBESITY Melissa Leach is here to discuss her progress with her obesity treatment plan along with follow-up of her obesity related diagnoses. Melissa Leach is on the Category 2 Plan and following a lower carbohydrate, vegetable and lean protein rich diet plan and states she is following her eating plan approximately 30% of the time. Melissa Leach states she is walking 7 times per week.  Today's visit was #: 80 Starting weight: 216 lbs Starting date: 04/08/2019 Today's weight: 179 lbs Today's date: 06/19/2022 Total lbs lost to date: 37 Total lbs lost since last in-office visit: 4  Interim History: Melissa Leach continues to work on her diet and weight loss. She is walking for exercise, but she has not increased her strengthening exercise.   Subjective:   1. Pre-diabetes Melissa Leach's labs were discussed in depth today. Her glucose, A1c, and fasting insulin have all greatly improved; mostly with her diet and weight loss.   2. Vitamin D deficiency Melissa Leach's Vitamin D level is at goal on Vitamin D prescription. No side effects were noted. I discussed labs with the patient today.   3. Emotional Eating Behavior Melissa Leach is stable on her medications, and she is doing well with decreasing emotional eating behavior. No side effects were noted.   Assessment/Plan:   1. Pre-diabetes Melissa Leach will continue with her diet and exercise. We will refill metformin for 1 month.   - metFORMIN (GLUCOPHAGE) 500 MG tablet; Take 1 tablet (500 mg total) by mouth daily with breakfast.  Dispense: 30 tablet; Refill: 0  2. Vitamin D deficiency Melissa Leach will continue prescription Vitamin D, and we will refill for 1 month.   - Vitamin D, Ergocalciferol, (DRISDOL) 1.25 MG (50000 UNIT) CAPS capsule; TAKE 1 CAPSULE BY MOUTH ONE TIME PER WEEK  Dispense: 4 capsule; Refill: 0  3. Emotional Eating Behavior Melissa Leach will continue her medications, and we will refill Topamax and Wellbutrin SR for 1 month.   - topiramate  (TOPAMAX) 25 MG tablet; Take 1 tablet (25 mg total) by mouth daily.  Dispense: 30 tablet; Refill: 0 - buPROPion (WELLBUTRIN SR) 150 MG 12 hr tablet; Take 1 tablet (150 mg total) by mouth 2 (two) times daily.  Dispense: 60 tablet; Refill: 0  4. BMI 33.0-33.9,adult  5. Obesity, Beginning BMI 40.81 Melissa Leach is currently in the action stage of change. As such, her goal is to continue with weight loss efforts. She has agreed to the Category 2 Plan.   Exercise goals: As is. Melissa Leach is to start chair yoga for exercise.   Behavioral modification strategies: increasing lean protein intake and no skipping meals.  Melissa Leach has agreed to follow-up with our clinic in 4 weeks. She was informed of the importance of frequent follow-up visits to maximize her success with intensive lifestyle modifications for her multiple health conditions.   Objective:   Blood pressure 95/67, pulse 76, temperature 98 F (36.7 C), height 5\' 1"  (1.549 m), weight 179 lb (81.2 kg), last menstrual period 12/27/2002, SpO2 97 %. Body mass index is 33.82 kg/m.  Lab Results  Component Value Date   CREATININE 0.90 05/22/2022   BUN 27 05/22/2022   NA 138 05/22/2022   K 4.5 05/22/2022   CL 104 05/22/2022   CO2 21 05/22/2022   Lab Results  Component Value Date   ALT 15 05/22/2022   AST 22 05/22/2022   ALKPHOS 132 (H) 05/22/2022   BILITOT 0.3 05/22/2022   Lab Results  Component Value Date   HGBA1C 5.1 05/22/2022  HGBA1C 5.5 12/11/2021   HGBA1C 5.2 05/29/2021   HGBA1C 5.4 01/11/2021   HGBA1C 5.3 05/29/2020   Lab Results  Component Value Date   INSULIN 6.1 05/22/2022   INSULIN 15.5 12/11/2021   INSULIN 9.1 01/11/2021   INSULIN 9.2 05/29/2020   INSULIN 8.3 11/01/2019   Lab Results  Component Value Date   TSH 2.680 05/22/2022   Lab Results  Component Value Date   CHOL 134 05/22/2022   HDL 53 05/22/2022   LDLCALC 59 05/22/2022   TRIG 126 05/22/2022   Lab Results  Component Value Date   VD25OH 55.9  05/22/2022   VD25OH 39.1 12/11/2021   VD25OH 65.3 01/11/2021   Lab Results  Component Value Date   WBC 7.3 09/06/2021   HGB 13.8 09/06/2021   HCT 41.4 09/06/2021   MCV 93 09/06/2021   PLT 267 09/06/2021   No results found for: "IRON", "TIBC", "FERRITIN"  Attestation Statements:   Reviewed by clinician on day of visit: allergies, medications, problem list, medical history, surgical history, family history, social history, and previous encounter notes.   I, Trixie Dredge, am acting as transcriptionist for Dennard Nip, MD.  I have reviewed the above documentation for accuracy and completeness, and I agree with the above. -  Dennard Nip, MD

## 2022-06-26 DIAGNOSIS — M25569 Pain in unspecified knee: Secondary | ICD-10-CM | POA: Diagnosis not present

## 2022-07-03 DIAGNOSIS — M25569 Pain in unspecified knee: Secondary | ICD-10-CM | POA: Diagnosis not present

## 2022-07-17 ENCOUNTER — Ambulatory Visit (INDEPENDENT_AMBULATORY_CARE_PROVIDER_SITE_OTHER): Payer: Medicare HMO | Admitting: Family Medicine

## 2022-07-17 ENCOUNTER — Encounter (INDEPENDENT_AMBULATORY_CARE_PROVIDER_SITE_OTHER): Payer: Self-pay | Admitting: Family Medicine

## 2022-07-17 VITALS — BP 85/57 | HR 84 | Temp 98.1°F | Ht 61.0 in | Wt 179.0 lb

## 2022-07-17 DIAGNOSIS — R7303 Prediabetes: Secondary | ICD-10-CM | POA: Diagnosis not present

## 2022-07-17 DIAGNOSIS — E669 Obesity, unspecified: Secondary | ICD-10-CM | POA: Diagnosis not present

## 2022-07-17 DIAGNOSIS — E559 Vitamin D deficiency, unspecified: Secondary | ICD-10-CM | POA: Diagnosis not present

## 2022-07-17 DIAGNOSIS — F3289 Other specified depressive episodes: Secondary | ICD-10-CM | POA: Diagnosis not present

## 2022-07-17 DIAGNOSIS — Z6833 Body mass index (BMI) 33.0-33.9, adult: Secondary | ICD-10-CM | POA: Diagnosis not present

## 2022-07-17 DIAGNOSIS — M25569 Pain in unspecified knee: Secondary | ICD-10-CM | POA: Diagnosis not present

## 2022-07-17 DIAGNOSIS — R69 Illness, unspecified: Secondary | ICD-10-CM | POA: Diagnosis not present

## 2022-07-17 MED ORDER — TOPIRAMATE 25 MG PO TABS: 25.0000 mg | ORAL_TABLET | Freq: Every day | ORAL | 0 refills | Status: DC

## 2022-07-17 MED ORDER — BUPROPION HCL ER (SR) 150 MG PO TB12: 150.0000 mg | ORAL_TABLET | Freq: Two times a day (BID) | ORAL | 0 refills | Status: DC

## 2022-07-17 MED ORDER — VITAMIN D (ERGOCALCIFEROL) 1.25 MG (50000 UNIT) PO CAPS: ORAL_CAPSULE | ORAL | 0 refills | Status: DC

## 2022-07-17 MED ORDER — METFORMIN HCL 500 MG PO TABS: 500.0000 mg | ORAL_TABLET | Freq: Every day | ORAL | 0 refills | Status: DC

## 2022-07-22 NOTE — Progress Notes (Unsigned)
Chief Complaint:   OBESITY Melissa Leach is here to discuss her progress with her obesity treatment plan along with follow-up of her obesity related diagnoses. Melissa Leach is on the Category 2 Plan and states she is following her eating plan approximately 50% of the time. Melissa Leach states she is walking for 10-15 minutes 7 times per week.  Today's visit was #: 42 Starting weight: 216 lbs Starting date: 04/08/2019 Today's weight: 179 lbs Today's date: 07/17/2022 Total lbs lost to date: 37 Total lbs lost since last in-office visit: 0  Interim History: Melissa Leach has done well with maintaining her weight loss in the last month. She has had some challenges but she was able to use some damage control strategies to avoid weight again.   Subjective:   1. Pre-diabetes Melissa Leach is working on her diet and she is doing well on metformin with no side effects noted.   2. Vitamin D deficiency Melissa Leach is stable on her Vitamin D, and she denies nausea, vomiting, or muscle weakness.   3. Emotional Eating Behavior Melissa Leach is stable on her medications, and she is still working on decreasing emotional eating behavior but she struggles at night.   Assessment/Plan:   1. Pre-diabetes Melissa Leach will continue with her diet and metformin. We will refill metformin for 1 month.   - metFORMIN (GLUCOPHAGE) 500 MG tablet; Take 1 tablet (500 mg total) by mouth daily with breakfast.  Dispense: 30 tablet; Refill: 0  2. Vitamin D deficiency Melissa Leach will continue prescription Vitamin D, and we will refill for 1 month.   - Vitamin D, Ergocalciferol, (DRISDOL) 1.25 MG (50000 UNIT) CAPS capsule; TAKE 1 CAPSULE BY MOUTH ONE TIME PER WEEK  Dispense: 4 capsule; Refill: 0  3. Emotional Eating Behavior We will refill Wellbutrin SR and Topamax for 1 month. We discussed emotional eating behavior strategies.   - buPROPion (WELLBUTRIN SR) 150 MG 12 hr tablet; Take 1 tablet (150 mg total) by mouth 2 (two) times daily.  Dispense: 60  tablet; Refill: 0 - topiramate (TOPAMAX) 25 MG tablet; Take 1 tablet (25 mg total) by mouth daily.  Dispense: 30 tablet; Refill: 0  4. BMI 33.0-33.9,adult  5. Obesity, Beginning BMI 40.81 Hema is currently in the action stage of change. As such, her goal is to continue with weight loss efforts. She has agreed to the Category 2 Plan.   Exercise goals: For substantial health benefits, adults should do at least 150 minutes (2 hours and 30 minutes) a week of moderate-intensity, or 75 minutes (1 hour and 15 minutes) a week of vigorous-intensity aerobic physical activity, or an equivalent combination of moderate- and vigorous-intensity aerobic activity. Aerobic activity should be performed in episodes of at least 10 minutes, and preferably, it should be spread throughout the week.  Behavioral modification strategies: increasing lean protein intake and increasing water intake.  Melissa Leach has agreed to follow-up with our clinic in 4 weeks. She was informed of the importance of frequent follow-up visits to maximize her success with intensive lifestyle modifications for her multiple health conditions.   Objective:   Blood pressure (!) 85/57, pulse 84, temperature 98.1 F (36.7 C), height  (1.549 m), weight 179 lb (81.2 kg), last menstrual period 12/27/2002, SpO2 94 %. Body mass index is 33.82 kg/m.  Lab Results  Component Value Date   CREATININE 0.90 05/22/2022   BUN 27 05/22/2022   NA 138 05/22/2022   K 4.5 05/22/2022   CL 104 05/22/2022   CO2 21 05/22/2022  Lab Results  Component Value Date   ALT 15 05/22/2022   AST 22 05/22/2022   ALKPHOS 132 (H) 05/22/2022   BILITOT 0.3 05/22/2022   Lab Results  Component Value Date   HGBA1C 5.1 05/22/2022   HGBA1C 5.5 12/11/2021   HGBA1C 5.2 05/29/2021   HGBA1C 5.4 01/11/2021   HGBA1C 5.3 05/29/2020   Lab Results  Component Value Date   INSULIN 6.1 05/22/2022   INSULIN 15.5 12/11/2021   INSULIN 9.1 01/11/2021   INSULIN 9.2  05/29/2020   INSULIN 8.3 11/01/2019   Lab Results  Component Value Date   TSH 2.680 05/22/2022   Lab Results  Component Value Date   CHOL 134 05/22/2022   HDL 53 05/22/2022   LDLCALC 59 05/22/2022   TRIG 126 05/22/2022   Lab Results  Component Value Date   VD25OH 55.9 05/22/2022   VD25OH 39.1 12/11/2021   VD25OH 65.3 01/11/2021   Lab Results  Component Value Date   WBC 7.3 09/06/2021   HGB 13.8 09/06/2021   HCT 41.4 09/06/2021   MCV 93 09/06/2021   PLT 267 09/06/2021   No results found for: "IRON", "TIBC", "FERRITIN"  Attestation Statements:   Reviewed by clinician on day of visit: allergies, medications, problem list, medical history, surgical history, family history, social history, and previous encounter notes.   I, Burt Knack, am acting as transcriptionist for Quillian Quince, MD.  I have reviewed the above documentation for accuracy and completeness, and I agree with the above. -  Quillian Quince, MD

## 2022-07-29 DIAGNOSIS — M25569 Pain in unspecified knee: Secondary | ICD-10-CM | POA: Diagnosis not present

## 2022-08-01 DIAGNOSIS — M25569 Pain in unspecified knee: Secondary | ICD-10-CM | POA: Diagnosis not present

## 2022-08-19 DIAGNOSIS — Z1231 Encounter for screening mammogram for malignant neoplasm of breast: Secondary | ICD-10-CM | POA: Diagnosis not present

## 2022-08-20 DIAGNOSIS — M25569 Pain in unspecified knee: Secondary | ICD-10-CM | POA: Diagnosis not present

## 2022-08-22 ENCOUNTER — Encounter (INDEPENDENT_AMBULATORY_CARE_PROVIDER_SITE_OTHER): Payer: Self-pay | Admitting: Family Medicine

## 2022-08-22 ENCOUNTER — Ambulatory Visit (INDEPENDENT_AMBULATORY_CARE_PROVIDER_SITE_OTHER): Payer: Medicare HMO | Admitting: Family Medicine

## 2022-08-22 VITALS — BP 92/54 | HR 80 | Temp 97.8°F | Ht 61.0 in | Wt 179.0 lb

## 2022-08-22 DIAGNOSIS — E669 Obesity, unspecified: Secondary | ICD-10-CM

## 2022-08-22 DIAGNOSIS — Z6834 Body mass index (BMI) 34.0-34.9, adult: Secondary | ICD-10-CM | POA: Diagnosis not present

## 2022-08-22 DIAGNOSIS — M8580 Other specified disorders of bone density and structure, unspecified site: Secondary | ICD-10-CM

## 2022-08-22 DIAGNOSIS — F3289 Other specified depressive episodes: Secondary | ICD-10-CM | POA: Diagnosis not present

## 2022-08-22 DIAGNOSIS — R7303 Prediabetes: Secondary | ICD-10-CM

## 2022-08-22 MED ORDER — VITAMIN D (ERGOCALCIFEROL) 1.25 MG (50000 UNIT) PO CAPS: ORAL_CAPSULE | ORAL | 0 refills | Status: DC

## 2022-08-22 MED ORDER — TOPIRAMATE 25 MG PO TABS: 25.0000 mg | ORAL_TABLET | Freq: Every day | ORAL | 0 refills | Status: DC

## 2022-08-23 DIAGNOSIS — M25569 Pain in unspecified knee: Secondary | ICD-10-CM | POA: Diagnosis not present

## 2022-08-27 NOTE — Progress Notes (Unsigned)
Chief Complaint:   OBESITY Melissa Leach is here to discuss her progress with her obesity treatment plan along with follow-up of her obesity related diagnoses. Melissa Leach is on the Category 2 Plan and states she is following her eating plan approximately 50% of the time. [name] states she is walking for 30 minutes 7 times per week.  Today's visit was #: 43 Starting weight: 216 lbs Starting date: 04/08/2019 Today's weight: 179 lbs Today's date: 08/22/2022 Total lbs lost to date: 37 Total lbs lost since last in-office visit: 0  Interim History: Patient has maintained her weight even while traveling.  She is frustrated with not losing weight despite doing more social eating.  She would like to be on a GLP-1 but her insurance will not cover this.  She has questions about compounded GLP-1.  Subjective:   1. Pre-diabetes Patient is on metformin, but she still notes some p.m. polyphagia.  2. Osteopenia, unspecified location Patient is with a history of vitamin D deficiency, and she requests a refill today.  3. Emotional Eating Behavior Patient is on Topamax, but she is still struggling with cravings and emotional eating behavior.  Assessment/Plan:   1. Pre-diabetes Patient agreed to increase metformin to 500 mg twice daily.  2. Osteopenia, unspecified location Patient will continue prescription vitamin D, and we will refill for 1 month.  - Vitamin D, Ergocalciferol, (DRISDOL) 1.25 MG (50000 UNIT) CAPS capsule; TAKE 1 CAPSULE BY MOUTH ONE TIME PER WEEK  Dispense: 4 capsule; Refill: 0  3. Emotional Eating Behavior Patient will continue Topamax, and we will refill for 1 month.  Emotional eating behavior strategies were discussed.  - topiramate (TOPAMAX) 25 MG tablet; Take 1 tablet (25 mg total) by mouth daily.  Dispense: 30 tablet; Refill: 0  4. BMI 34.0-34.9,adult  5. Obesity, Beginning BMI 40.81 Melissa Leach is currently in the action stage of change. As such, her goal is to continue with  weight loss efforts. She has agreed to the Category 2 Plan.   Patient was advised that compounded GLP-1s are NOT FDA approved and the obesity medicine association strongly recommended against this. She was encouraged to follow her Category 2 plan closely to lose weight.   Exercise goals: As is.   Behavioral modification strategies: decreasing eating out.  Melissa Leach has agreed to follow-up with our clinic in 4 weeks. She was informed of the importance of frequent follow-up visits to maximize her success with intensive lifestyle modifications for her multiple health conditions.   Objective:   Blood pressure (!) 92/54, pulse 80, temperature 97.8 F (36.6 C), height 5\' 1"  (1.549 m), weight 179 lb (81.2 kg), last menstrual period 12/27/2002, SpO2 97 %. Body mass index is 33.82 kg/m.  Lab Results  Component Value Date   CREATININE 0.90 05/22/2022   BUN 27 05/22/2022   NA 138 05/22/2022   K 4.5 05/22/2022   CL 104 05/22/2022   CO2 21 05/22/2022   Lab Results  Component Value Date   ALT 15 05/22/2022   AST 22 05/22/2022   ALKPHOS 132 (H) 05/22/2022   BILITOT 0.3 05/22/2022   Lab Results  Component Value Date   HGBA1C 5.1 05/22/2022   HGBA1C 5.5 12/11/2021   HGBA1C 5.2 05/29/2021   HGBA1C 5.4 01/11/2021   HGBA1C 5.3 05/29/2020   Lab Results  Component Value Date   INSULIN 6.1 05/22/2022   INSULIN 15.5 12/11/2021   INSULIN 9.1 01/11/2021   INSULIN 9.2 05/29/2020   INSULIN 8.3 11/01/2019   Lab  Results  Component Value Date   TSH 2.680 05/22/2022   Lab Results  Component Value Date   CHOL 134 05/22/2022   HDL 53 05/22/2022   LDLCALC 59 05/22/2022   TRIG 126 05/22/2022   Lab Results  Component Value Date   VD25OH 55.9 05/22/2022   VD25OH 39.1 12/11/2021   VD25OH 65.3 01/11/2021   Lab Results  Component Value Date   WBC 7.3 09/06/2021   HGB 13.8 09/06/2021   HCT 41.4 09/06/2021   MCV 93 09/06/2021   PLT 267 09/06/2021   No results found for: "IRON", "TIBC",  "FERRITIN"  Attestation Statements:   Reviewed by clinician on day of visit: allergies, medications, problem list, medical history, surgical history, family history, social history, and previous encounter notes.   I, Burt Knack, am acting as transcriptionist for Quillian Quince, MD.  I have reviewed the above documentation for accuracy and completeness, and I agree with the above. -  Quillian Quince, MD

## 2022-09-02 DIAGNOSIS — M25569 Pain in unspecified knee: Secondary | ICD-10-CM | POA: Diagnosis not present

## 2022-09-07 ENCOUNTER — Other Ambulatory Visit (INDEPENDENT_AMBULATORY_CARE_PROVIDER_SITE_OTHER): Payer: Self-pay | Admitting: Family Medicine

## 2022-09-07 DIAGNOSIS — R7303 Prediabetes: Secondary | ICD-10-CM

## 2022-09-10 ENCOUNTER — Ambulatory Visit: Payer: Medicare HMO | Admitting: Neurology

## 2022-09-13 ENCOUNTER — Other Ambulatory Visit (INDEPENDENT_AMBULATORY_CARE_PROVIDER_SITE_OTHER): Payer: Self-pay | Admitting: Family Medicine

## 2022-09-13 DIAGNOSIS — F3289 Other specified depressive episodes: Secondary | ICD-10-CM

## 2022-09-17 ENCOUNTER — Other Ambulatory Visit (INDEPENDENT_AMBULATORY_CARE_PROVIDER_SITE_OTHER): Payer: Self-pay | Admitting: Family Medicine

## 2022-09-17 ENCOUNTER — Ambulatory Visit: Payer: Medicare HMO | Admitting: Neurology

## 2022-09-17 DIAGNOSIS — M8580 Other specified disorders of bone density and structure, unspecified site: Secondary | ICD-10-CM

## 2022-09-17 DIAGNOSIS — G43709 Chronic migraine without aura, not intractable, without status migrainosus: Secondary | ICD-10-CM

## 2022-09-17 MED ORDER — ONABOTULINUMTOXINA 200 UNITS IJ SOLR
155.0000 [IU] | Freq: Once | INTRAMUSCULAR | Status: AC
Start: 2022-09-17 — End: 2022-09-17
  Administered 2022-09-17: 155 [IU] via INTRAMUSCULAR

## 2022-09-17 NOTE — Progress Notes (Unsigned)
Consent Form Botulism Toxin Injection For Chronic Migraine  12/19/2021: stable 03/20/2022: stable  12/19/2021: stable 09/26/2021: stable. : Doing great, >>70% decrease migraines.   Reviewed orally with patient, additionally signature is on file:  Botulism toxin has been approved by the Federal drug administration for treatment of chronic migraine. Botulism toxin does not cure chronic migraine and it may not be effective in some patients.  The administration of botulism toxin is accomplished by injecting a small amount of toxin into the muscles of the neck and head. Dosage must be titrated for each individual. Any benefits resulting from botulism toxin tend to wear off after 3 months with a repeat injection required if benefit is to be maintained. Injections are usually done every 3-4 months with maximum effect peak achieved by about 2 or 3 weeks. Botulism toxin is expensive and you should be sure of what costs you will incur resulting from the injection.  The side effects of botulism toxin use for chronic migraine may include:   -Transient, and usually mild, facial weakness with facial injections  -Transient, and usually mild, head or neck weakness with head/neck injections  -Reduction or loss of forehead facial animation due to forehead muscle weakness  -Eyelid drooping  -Dry eye  -Pain at the site of injection or bruising at the site of injection  -Double vision  -Potential unknown long term risks  Contraindications: You should not have Botox if you are pregnant, nursing, allergic to albumin, have an infection, skin condition, or muscle weakness at the site of the injection, or have myasthenia gravis, Lambert-Eaton syndrome, or ALS.  It is also possible that as with any injection, there may be an allergic reaction or no effect from the medication. Reduced effectiveness after repeated injections is sometimes seen and rarely infection at the injection site may occur. All care will be taken to  prevent these side effects. If therapy is given over a long time, atrophy and wasting in the muscle injected may occur. Occasionally the patient's become refractory to treatment because they develop antibodies to the toxin. In this event, therapy needs to be modified.  I have read the above information and consent to the administration of botulism toxin.    BOTOX PROCEDURE NOTE FOR MIGRAINE HEADACHE    Contraindications and precautions discussed with patient(above). Aseptic procedure was observed and patient tolerated procedure. Procedure performed by Dr. Artemio Aly  The condition has existed for more than 6 months, and pt does not have a diagnosis of ALS, Myasthenia Gravis or Lambert-Eaton Syndrome.  Risks and benefits of injections discussed and pt agrees to proceed with the procedure.  Written consent obtained  These injections are medically necessary. Pt  receives good benefits from these injections. These injections do not cause sedations or hallucinations which the oral therapies may cause.  Description of procedure:  The patient was placed in a sitting position. The standard protocol was used for Botox as follows, with 5 units of Botox injected at each site:   -Procerus muscle, midline injection  -Corrugator muscle, bilateral injection  -Frontalis muscle, bilateral injection, with 2 sites each side, medial injection was performed in the upper one third of the frontalis muscle, in the region vertical from the medial inferior edge of the superior orbital rim. The lateral injection was again in the upper one third of the forehead vertically above the lateral limbus of the cornea, 1.5 cm lateral to the medial injection site.  -Temporalis muscle injection, 4 sites, bilaterally. The first injection was 3  cm above the tragus of the ear, second injection site was 1.5 cm to 3 cm up from the first injection site in line with the tragus of the ear. The third injection site was 1.5-3 cm forward  between the first 2 injection sites. The fourth injection site was 1.5 cm posterior to the second injection site.   -Occipitalis muscle injection, 3 sites, bilaterally. The first injection was done one half way between the occipital protuberance and the tip of the mastoid process behind the ear. The second injection site was done lateral and superior to the first, 1 fingerbreadth from the first injection. The third injection site was 1 fingerbreadth superiorly and medially from the first injection site.  -Cervical paraspinal muscle injection, 2 sites, bilateral knee first injection site was 1 cm from the midline of the cervical spine, 3 cm inferior to the lower border of the occipital protuberance. The second injection site was 1.5 cm superiorly and laterally to the first injection site.  -Trapezius muscle injection was performed at 3 sites, bilaterally. The first injection site was in the upper trapezius muscle halfway between the inflection point of the neck, and the acromion. The second injection site was one half way between the acromion and the first injection site. The third injection was done between the first injection site and the inflection point of the neck.   Will return for repeat injection in 3 months.   155 units of Botox was used, 45u Botox not injected was wasted. The patient tolerated the procedure well, there were no complications of the above procedure.

## 2022-09-17 NOTE — Progress Notes (Unsigned)
Botox- 200 units x 1 vial Lot: [id number] Expiration: 10/2024 NDC: 3086-5784-69  Bacteriostatic 0.9% Sodium Chloride-  4 mL  Lot: [id number] Expiration: 07/01/2023 NDC: 4132-4401-02  Dx: V25.366  B/B Witnessed by Delmer Islam

## 2022-09-19 ENCOUNTER — Ambulatory Visit (INDEPENDENT_AMBULATORY_CARE_PROVIDER_SITE_OTHER): Payer: Medicare HMO | Admitting: Family Medicine

## 2022-09-21 DIAGNOSIS — Z6833 Body mass index (BMI) 33.0-33.9, adult: Secondary | ICD-10-CM | POA: Diagnosis not present

## 2022-09-21 DIAGNOSIS — B001 Herpesviral vesicular dermatitis: Secondary | ICD-10-CM | POA: Diagnosis not present

## 2022-09-21 DIAGNOSIS — L0100 Impetigo, unspecified: Secondary | ICD-10-CM | POA: Diagnosis not present

## 2022-09-24 ENCOUNTER — Ambulatory Visit (INDEPENDENT_AMBULATORY_CARE_PROVIDER_SITE_OTHER): Payer: Medicare HMO | Admitting: Family Medicine

## 2022-09-24 ENCOUNTER — Encounter (INDEPENDENT_AMBULATORY_CARE_PROVIDER_SITE_OTHER): Payer: Self-pay | Admitting: Family Medicine

## 2022-09-24 VITALS — BP 106/71 | HR 80 | Temp 98.1°F | Ht 61.0 in | Wt 177.0 lb

## 2022-09-24 DIAGNOSIS — F3289 Other specified depressive episodes: Secondary | ICD-10-CM

## 2022-09-24 DIAGNOSIS — E559 Vitamin D deficiency, unspecified: Secondary | ICD-10-CM | POA: Diagnosis not present

## 2022-09-24 DIAGNOSIS — R7303 Prediabetes: Secondary | ICD-10-CM

## 2022-09-24 DIAGNOSIS — R4689 Other symptoms and signs involving appearance and behavior: Secondary | ICD-10-CM

## 2022-09-24 DIAGNOSIS — Z6833 Body mass index (BMI) 33.0-33.9, adult: Secondary | ICD-10-CM

## 2022-09-24 DIAGNOSIS — E669 Obesity, unspecified: Secondary | ICD-10-CM

## 2022-09-24 MED ORDER — METFORMIN HCL 500 MG PO TABS: 500.0000 mg | ORAL_TABLET | Freq: Every day | ORAL | 0 refills | Status: DC

## 2022-09-24 MED ORDER — VITAMIN D (ERGOCALCIFEROL) 1.25 MG (50000 UNIT) PO CAPS
ORAL_CAPSULE | ORAL | 0 refills | Status: DC
Start: 2022-09-24 — End: 2022-10-24

## 2022-09-24 MED ORDER — TOPIRAMATE 25 MG PO TABS: 25.0000 mg | ORAL_TABLET | Freq: Every day | ORAL | 0 refills | Status: DC

## 2022-09-24 MED ORDER — BUPROPION HCL ER (SR) 150 MG PO TB12: 150.0000 mg | ORAL_TABLET | Freq: Two times a day (BID) | ORAL | 0 refills | Status: DC

## 2022-09-25 NOTE — Progress Notes (Unsigned)
Chief Complaint:   OBESITY Melissa Leach is here to discuss her progress with her obesity treatment plan along with follow-up of her obesity related diagnoses. Melissa Leach is on the Category 2 Plan and states she is following her eating plan approximately 50% of the time. Melissa Leach states she is walking for 20 minutes 5 times per week.  Today's visit was #: 44 Starting weight: 216 lbs Starting date: 04/08/2019 Today's weight: 177 lbs Today's date: 09/24/2022 Total lbs lost to date: 39 Total lbs lost since last in-office visit: 2  Interim History: Patient went on vacation and indulged.  She has already gotten back on track with her eating plan.  Subjective:   1. Vitamin D deficiency Patient is on vitamin D with no side effects noted, and she requests a refill today.  2. Pre-diabetes Patient is stable on metformin, and she is working on her diet to help prevent diabetes mellitus.  3. Emotional Eating Behavior Patient feels she is mindful of her eating and outside of vacation, she is doing well with decreasing emotional eating behavior.  Assessment/Plan:   1. Vitamin D deficiency Patient will continue prescription vitamin D once weekly, and we will refill for 1 month.  - Vitamin D, Ergocalciferol, (DRISDOL) 1.25 MG (50000 UNIT) CAPS capsule; TAKE 1 CAPSULE BY MOUTH ONE TIME PER WEEK  Dispense: 4 capsule; Refill: 0  2. Pre-diabetes Patient will continue metformin 500 mg daily, and we will refill for 1 month.  - metFORMIN (GLUCOPHAGE) 500 MG tablet; Take 1 tablet (500 mg total) by mouth daily with breakfast.  Dispense: 30 tablet; Refill: 0  3. Emotional Eating Behavior Patient will continue her medications, and we will refill Wellbutrin SR and Topamax for 1 month.  - buPROPion (WELLBUTRIN SR) 150 MG 12 hr tablet; Take 1 tablet (150 mg total) by mouth 2 (two) times daily.  Dispense: 60 tablet; Refill: 0 - topiramate (TOPAMAX) 25 MG tablet; Take 1 tablet (25 mg total) by mouth daily.   Dispense: 30 tablet; Refill: 0  4. BMI 33.0-33.9,adult  5. Obesity, Beginning BMI 40.81 Melissa Leach is currently in the action stage of change. As such, her goal is to continue with weight loss efforts. She has agreed to the Category 2 Plan.   Exercise goals: For substantial health benefits, adults should do at least 150 minutes (2 hours and 30 minutes) a week of moderate-intensity, or 75 minutes (1 hour and 15 minutes) a week of vigorous-intensity aerobic physical activity, or an equivalent combination of moderate- and vigorous-intensity aerobic activity. Aerobic activity should be performed in episodes of at least 10 minutes, and preferably, it should be spread throughout the week.  Behavioral modification strategies: increasing lean protein intake and increasing water intake.  Melissa Leach has agreed to follow-up with our clinic in 4 weeks. She was informed of the importance of frequent follow-up visits to maximize her success with intensive lifestyle modifications for her multiple health conditions.   Objective:   Blood pressure 106/71, pulse 80, temperature 98.1 F (36.7 C), height 5\' 1"  (1.549 m), weight 177 lb (80.3 kg), last menstrual period 12/27/2002, SpO2 99 %. Body mass index is 33.44 kg/m.  Lab Results  Component Value Date   CREATININE 0.90 05/22/2022   BUN 27 05/22/2022   NA 138 05/22/2022   K 4.5 05/22/2022   CL 104 05/22/2022   CO2 21 05/22/2022   Lab Results  Component Value Date   ALT 15 05/22/2022   AST 22 05/22/2022   ALKPHOS 132 (H)  05/22/2022   BILITOT 0.3 05/22/2022   Lab Results  Component Value Date   HGBA1C 5.1 05/22/2022   HGBA1C 5.5 12/11/2021   HGBA1C 5.2 05/29/2021   HGBA1C 5.4 01/11/2021   HGBA1C 5.3 05/29/2020   Lab Results  Component Value Date   INSULIN 6.1 05/22/2022   INSULIN 15.5 12/11/2021   INSULIN 9.1 01/11/2021   INSULIN 9.2 05/29/2020   INSULIN 8.3 11/01/2019   Lab Results  Component Value Date   TSH 2.680 05/22/2022   Lab  Results  Component Value Date   CHOL 134 05/22/2022   HDL 53 05/22/2022   LDLCALC 59 05/22/2022   TRIG 126 05/22/2022   Lab Results  Component Value Date   VD25OH 55.9 05/22/2022   VD25OH 39.1 12/11/2021   VD25OH 65.3 01/11/2021   Lab Results  Component Value Date   WBC 7.3 09/06/2021   HGB 13.8 09/06/2021   HCT 41.4 09/06/2021   MCV 93 09/06/2021   PLT 267 09/06/2021   No results found for: "IRON", "TIBC", "FERRITIN"  Attestation Statements:   Reviewed by clinician on day of visit: allergies, medications, problem list, medical history, surgical history, family history, social history, and previous encounter notes.   I, Burt Knack, am acting as transcriptionist for Quillian Quince, MD.  I have reviewed the above documentation for accuracy and completeness, and I agree with the above. -  Quillian Quince, MD

## 2022-10-01 DIAGNOSIS — F324 Major depressive disorder, single episode, in partial remission: Secondary | ICD-10-CM | POA: Diagnosis not present

## 2022-10-01 DIAGNOSIS — Z0000 Encounter for general adult medical examination without abnormal findings: Secondary | ICD-10-CM | POA: Diagnosis not present

## 2022-10-01 DIAGNOSIS — Z9181 History of falling: Secondary | ICD-10-CM | POA: Diagnosis not present

## 2022-10-01 DIAGNOSIS — M1990 Unspecified osteoarthritis, unspecified site: Secondary | ICD-10-CM | POA: Diagnosis not present

## 2022-10-01 DIAGNOSIS — I700 Atherosclerosis of aorta: Secondary | ICD-10-CM | POA: Diagnosis not present

## 2022-10-01 DIAGNOSIS — J439 Emphysema, unspecified: Secondary | ICD-10-CM | POA: Diagnosis not present

## 2022-10-07 ENCOUNTER — Other Ambulatory Visit (HOSPITAL_COMMUNITY): Payer: Self-pay

## 2022-10-09 ENCOUNTER — Encounter (INDEPENDENT_AMBULATORY_CARE_PROVIDER_SITE_OTHER): Payer: Self-pay | Admitting: Family Medicine

## 2022-10-09 DIAGNOSIS — M25569 Pain in unspecified knee: Secondary | ICD-10-CM | POA: Diagnosis not present

## 2022-10-09 NOTE — Telephone Encounter (Signed)
Dr. Dalbert Garnet can you please advise? Patient was last seen on 09/24/22 and has a follow-up on 10/24/22.

## 2022-10-18 DIAGNOSIS — M25569 Pain in unspecified knee: Secondary | ICD-10-CM | POA: Diagnosis not present

## 2022-10-19 ENCOUNTER — Other Ambulatory Visit (INDEPENDENT_AMBULATORY_CARE_PROVIDER_SITE_OTHER): Payer: Self-pay | Admitting: Family Medicine

## 2022-10-19 DIAGNOSIS — E559 Vitamin D deficiency, unspecified: Secondary | ICD-10-CM

## 2022-10-24 ENCOUNTER — Ambulatory Visit (INDEPENDENT_AMBULATORY_CARE_PROVIDER_SITE_OTHER): Payer: Medicare HMO | Admitting: Family Medicine

## 2022-10-24 ENCOUNTER — Other Ambulatory Visit (HOSPITAL_COMMUNITY): Payer: Self-pay | Admitting: Family Medicine

## 2022-10-24 ENCOUNTER — Encounter (INDEPENDENT_AMBULATORY_CARE_PROVIDER_SITE_OTHER): Payer: Self-pay | Admitting: Family Medicine

## 2022-10-24 VITALS — BP 90/61 | HR 90 | Temp 97.9°F | Ht 61.0 in | Wt 176.0 lb

## 2022-10-24 DIAGNOSIS — I700 Atherosclerosis of aorta: Secondary | ICD-10-CM

## 2022-10-24 DIAGNOSIS — Z6833 Body mass index (BMI) 33.0-33.9, adult: Secondary | ICD-10-CM | POA: Diagnosis not present

## 2022-10-24 DIAGNOSIS — E669 Obesity, unspecified: Secondary | ICD-10-CM

## 2022-10-24 DIAGNOSIS — F3289 Other specified depressive episodes: Secondary | ICD-10-CM | POA: Diagnosis not present

## 2022-10-24 DIAGNOSIS — E559 Vitamin D deficiency, unspecified: Secondary | ICD-10-CM

## 2022-10-24 DIAGNOSIS — R7303 Prediabetes: Secondary | ICD-10-CM | POA: Diagnosis not present

## 2022-10-24 DIAGNOSIS — R632 Polyphagia: Secondary | ICD-10-CM | POA: Diagnosis not present

## 2022-10-24 MED ORDER — METFORMIN HCL 500 MG PO TABS
500.0000 mg | ORAL_TABLET | Freq: Every day | ORAL | 0 refills | Status: DC
Start: 2022-10-24 — End: 2022-11-27

## 2022-10-24 MED ORDER — VITAMIN D (ERGOCALCIFEROL) 1.25 MG (50000 UNIT) PO CAPS: ORAL_CAPSULE | ORAL | 0 refills | Status: DC

## 2022-10-24 MED ORDER — TOPIRAMATE 25 MG PO TABS
25.0000 mg | ORAL_TABLET | Freq: Every day | ORAL | 0 refills | Status: DC
Start: 2022-10-24 — End: 2022-11-27

## 2022-10-24 MED ORDER — ZEPBOUND 5 MG/0.5ML ~~LOC~~ SOAJ
5.0000 mg | SUBCUTANEOUS | 0 refills | Status: DC
Start: 2022-10-24 — End: 2022-11-27

## 2022-10-28 NOTE — Progress Notes (Signed)
Chief Complaint:   OBESITY Melissa Leach is here to discuss her progress with her obesity treatment plan along with follow-up of her obesity related diagnoses. Kenny is on the Category 2 Plan and states she is following her eating plan approximately 90% of the time. Melissa Leach states she is doing chair yoga and walking for 20 minutes 7 times per week.  Today's visit was #: 45 Starting weight: 216 lbs Starting date: 04/08/2019 Today's weight: 176 lbs Today's date: 10/24/2022 Total lbs lost to date: 40 Total lbs lost since last in-office visit: 1  Interim History: Patient is working on her weight loss.  She started Zepbound at 2.5 mg with no side effects yet.  She continues to exercise and she is working on not skipping meals.  Subjective:   1. Polyphagia Patient was started on Zepbound from another clinic.  She feels she is doing well after her first dose.  2. Vitamin D deficiency Patient is stable on vitamin D with no side effects noted.  3. Pre-diabetes Patient is doing well on metformin, and she is working on her diet and exercise.  4. Emotional Eating Behavior Patient is stable on Topamax with no side effects noted.  Assessment/Plan:   1. Polyphagia Patient agreed to increase Zepbound to 5 mg once weekly, and we will refill for 1 month.  (Patient is to finish her first month of 2.5 mg before increasing to 5 mg).   - tirzepatide (ZEPBOUND) 5 MG/0.5ML Pen; Inject 5 mg into the skin once a week.  Dispense: 2 mL; Refill: 0  2. Vitamin D deficiency Patient will continue prescription vitamin D, and we will refill for 1 month.  - Vitamin D, Ergocalciferol, (DRISDOL) 1.25 MG (50000 UNIT) CAPS capsule; TAKE 1 CAPSULE BY MOUTH ONE TIME PER WEEK  Dispense: 4 capsule; Refill: 0  3. Pre-diabetes We will refill metformin for 1 month.  Patient was informed that metformin and Zepbound works synergistically.  - metFORMIN (GLUCOPHAGE) 500 MG tablet; Take 1 tablet (500 mg total) by mouth  daily with breakfast.  Dispense: 30 tablet; Refill: 0  4. Emotional Eating Behavior Patient will continue Topamax 25 mg, and we will refill for 1 month.  - topiramate (TOPAMAX) 25 MG tablet; Take 1 tablet (25 mg total) by mouth daily.  Dispense: 30 tablet; Refill: 0  5. BMI 33.0-33.9,adult  6. Obesity, Beginning BMI 40.81 [name] is currently in the action stage of change. As such, her goal is to continue with weight loss efforts. She has agreed to the Category 2 Plan.   Exercise goals: As is.   Behavioral modification strategies: increasing lean protein intake and no skipping meals.  Melissa Leach has agreed to follow-up with our clinic in 4 weeks. She was informed of the importance of frequent follow-up visits to maximize her success with intensive lifestyle modifications for her multiple health conditions.   Objective:   Blood pressure 90/61, pulse 90, temperature 97.9 F (36.6 C), height 5\' 1"  (1.549 m), weight 176 lb (79.8 kg), last menstrual period 12/27/2002, SpO2 100%. Body mass index is 33.25 kg/m.  Lab Results  Component Value Date   CREATININE 0.90 05/22/2022   BUN 27 05/22/2022   NA 138 05/22/2022   K 4.5 05/22/2022   CL 104 05/22/2022   CO2 21 05/22/2022   Lab Results  Component Value Date   ALT 15 05/22/2022   AST 22 05/22/2022   ALKPHOS 132 (H) 05/22/2022   BILITOT 0.3 05/22/2022   Lab Results  Component  Value Date   HGBA1C 5.1 05/22/2022   HGBA1C 5.5 12/11/2021   HGBA1C 5.2 05/29/2021   HGBA1C 5.4 01/11/2021   HGBA1C 5.3 05/29/2020   Lab Results  Component Value Date   INSULIN 6.1 05/22/2022   INSULIN 15.5 12/11/2021   INSULIN 9.1 01/11/2021   INSULIN 9.2 05/29/2020   INSULIN 8.3 11/01/2019   Lab Results  Component Value Date   TSH 2.680 05/22/2022   Lab Results  Component Value Date   CHOL 134 05/22/2022   HDL 53 05/22/2022   LDLCALC 59 05/22/2022   TRIG 126 05/22/2022   Lab Results  Component Value Date   VD25OH 55.9 05/22/2022    VD25OH 39.1 12/11/2021   VD25OH 65.3 01/11/2021   Lab Results  Component Value Date   WBC 7.3 09/06/2021   HGB 13.8 09/06/2021   HCT 41.4 09/06/2021   MCV 93 09/06/2021   PLT 267 09/06/2021   No results found for: "IRON", "TIBC", "FERRITIN"  Attestation Statements:   Reviewed by clinician on day of visit: allergies, medications, problem list, medical history, surgical history, family history, social history, and previous encounter notes.   I, Burt Knack, am acting as transcriptionist for Quillian Quince, MD.  I have reviewed the above documentation for accuracy and completeness, and I agree with the above. -  Quillian Quince, MD

## 2022-11-01 DIAGNOSIS — M25569 Pain in unspecified knee: Secondary | ICD-10-CM | POA: Diagnosis not present

## 2022-11-04 ENCOUNTER — Ambulatory Visit (HOSPITAL_COMMUNITY)
Admission: RE | Admit: 2022-11-04 | Discharge: 2022-11-04 | Disposition: A | Discharge status: Home / Self Care | Payer: Medicare HMO | Source: Ambulatory Visit | Attending: Family Medicine | Admitting: Family Medicine

## 2022-11-04 DIAGNOSIS — I080 Rheumatic disorders of both mitral and aortic valves: Secondary | ICD-10-CM | POA: Diagnosis not present

## 2022-11-04 DIAGNOSIS — I700 Atherosclerosis of aorta: Secondary | ICD-10-CM | POA: Insufficient documentation

## 2022-11-04 DIAGNOSIS — E785 Hyperlipidemia, unspecified: Secondary | ICD-10-CM | POA: Diagnosis not present

## 2022-11-04 LAB — ECHOCARDIOGRAM COMPLETE
AV Vena cont: 0.3 cm
Area-P 1/2: 2.91 cm2
Calc EF: 57.8 %
MV M vel: 5.04 m/s
MV Peak grad: 101.7 mmHg
P 1/2 time: 474 msec
Radius: 0.36 cm
S' Lateral: 3.5 cm
Single Plane A2C EF: 58.3 %
Single Plane A4C EF: 55.8 %

## 2022-11-04 NOTE — Progress Notes (Signed)
Echocardiogram 2D Echocardiogram has been performed.  Melissa Leach 11/04/2022, 11:58 AM

## 2022-11-17 ENCOUNTER — Other Ambulatory Visit (INDEPENDENT_AMBULATORY_CARE_PROVIDER_SITE_OTHER): Payer: Self-pay | Admitting: Family Medicine

## 2022-11-17 DIAGNOSIS — F3289 Other specified depressive episodes: Secondary | ICD-10-CM

## 2022-11-17 DIAGNOSIS — R7303 Prediabetes: Secondary | ICD-10-CM

## 2022-11-25 ENCOUNTER — Other Ambulatory Visit (INDEPENDENT_AMBULATORY_CARE_PROVIDER_SITE_OTHER): Payer: Self-pay | Admitting: Family Medicine

## 2022-11-25 DIAGNOSIS — F3289 Other specified depressive episodes: Secondary | ICD-10-CM

## 2022-11-27 ENCOUNTER — Ambulatory Visit (INDEPENDENT_AMBULATORY_CARE_PROVIDER_SITE_OTHER): Payer: Medicare HMO | Admitting: Family Medicine

## 2022-11-27 ENCOUNTER — Telehealth (INDEPENDENT_AMBULATORY_CARE_PROVIDER_SITE_OTHER): Payer: Self-pay | Admitting: Family Medicine

## 2022-11-27 ENCOUNTER — Encounter (INDEPENDENT_AMBULATORY_CARE_PROVIDER_SITE_OTHER): Payer: Self-pay | Admitting: Family Medicine

## 2022-11-27 VITALS — BP 93/64 | HR 78 | Temp 97.6°F | Ht 61.0 in | Wt 170.0 lb

## 2022-11-27 DIAGNOSIS — R7303 Prediabetes: Secondary | ICD-10-CM | POA: Diagnosis not present

## 2022-11-27 DIAGNOSIS — E559 Vitamin D deficiency, unspecified: Secondary | ICD-10-CM

## 2022-11-27 DIAGNOSIS — E669 Obesity, unspecified: Secondary | ICD-10-CM | POA: Diagnosis not present

## 2022-11-27 DIAGNOSIS — F3289 Other specified depressive episodes: Secondary | ICD-10-CM

## 2022-11-27 DIAGNOSIS — I5189 Other ill-defined heart diseases: Secondary | ICD-10-CM | POA: Diagnosis not present

## 2022-11-27 DIAGNOSIS — Z6832 Body mass index (BMI) 32.0-32.9, adult: Secondary | ICD-10-CM | POA: Diagnosis not present

## 2022-11-27 MED ORDER — TOPIRAMATE 25 MG PO TABS
25.0000 mg | ORAL_TABLET | Freq: Every day | ORAL | 0 refills | Status: AC
Start: 2022-11-27 — End: ?

## 2022-11-27 MED ORDER — BUPROPION HCL ER (SR) 150 MG PO TB12
150.0000 mg | ORAL_TABLET | Freq: Two times a day (BID) | ORAL | 0 refills | Status: AC
Start: 2022-11-27 — End: ?

## 2022-11-27 MED ORDER — VITAMIN D (ERGOCALCIFEROL) 1.25 MG (50000 UNIT) PO CAPS
ORAL_CAPSULE | ORAL | 0 refills | Status: AC
Start: 2022-11-27 — End: ?

## 2022-11-27 MED ORDER — ZEPBOUND 5 MG/0.5ML ~~LOC~~ SOAJ
5.0000 mg | SUBCUTANEOUS | 0 refills | Status: AC
Start: 2022-11-27 — End: ?

## 2022-11-27 MED ORDER — METFORMIN HCL 500 MG PO TABS
500.0000 mg | ORAL_TABLET | Freq: Every day | ORAL | 0 refills | Status: AC
Start: 2022-11-27 — End: ?

## 2022-11-27 NOTE — Progress Notes (Signed)
[facility]  Office: [phone]  /  Fax: [phone]  WEIGHT SUMMARY AND BIOMETRICS  Anthropometric Measurements Height: 5\' 1"  (1.549 m) Weight: 170 lb (77.1 kg) BMI (Calculated): 32.14 Weight at Last Visit: 176 lb Weight Lost Since Last Visit: 6 lb Weight Gained Since Last Visit: 0 Starting Weight: 216 lb Total Weight Loss (lbs): 46 lb (20.9 kg)   Body Composition  Body Fat %: 43.9 % Fat Mass (lbs): 75 lbs Muscle Mass (lbs): 91 lbs Total Body Water (lbs): 64.6 lbs Visceral Fat Rating : 13   Other Clinical Data Fasting: No Labs: No Today's Visit #: 3 Starting Date: 04/08/19    Chief Complaint: OBESITY     History of Present Illness   The patient is a 72 year old individual with a history of obesity, prediabetes, vitamin D deficiency, and depression associated with emotional eating behaviors. She has lost six pounds since the last visit a month ago and has been adhering to the category two plan approximately 80% of the time. She has been engaging in daily 15-minute walks for exercise. The patient is currently on topiramate and Wellbutrin for depression and emotional eating behaviors and reports doing well on these medications.  The patient has been prescribed Zepbound for weight loss and has been managing well with the injections. Despite the change, she is willing to continue with the medication due to its effectiveness in managing her cravings. She is open to trying the new vial of of Zepbound as this will cost her less since she pays out of pocket.  The patient has been experiencing anxiety, which she attributes to her general anxiety disorder rather than a side effect of her current medications. She also takes Zoloft for her mental health condition and has no plans to discontinue it.  The patient recently underwent an echocardiogram due to concerns from her primary care provider. The results indicated a low level of grade one diastolic dysfunction, but she has been  referred to a cardiologist for further evaluation. Despite this, her blood pressure has been well controlled.  The patient's last A1c level, checked in February, was 5.1, indicating good control of her prediabetes. She has been managing her diet well, focusing on protein intake and reducing cravings for unhealthy foods. She has been consuming more vegetables and fruits, particularly cantaloupe, and has been making zucchini chips as a healthy snack option. She has also been avoiding high-calorie foods like doughnuts.  The patient's vitamin D levels were last checked in February and were within the normal range. However, she is due for another check at her next visit. Despite her health challenges, the patient has been making significant progress in managing her conditions and improving her overall health.          PHYSICAL EXAM:  Blood pressure 93/64, pulse 78, temperature 97.6 F (36.4 C), height 5\' 1"  (1.549 m), weight 170 lb (77.1 kg), last menstrual period 12/27/2002, SpO2 97%. Body mass index is 32.12 kg/m.  DIAGNOSTIC DATA REVIEWED:  BMET    Component Value Date/Time   NA 138 05/22/2022 1058   K 4.5 05/22/2022 1058   CL 104 05/22/2022 1058   CO2 21 05/22/2022 1058   GLUCOSE 81 05/22/2022 1058   GLUCOSE 116 (H) 06/01/2021 0316   BUN 27 05/22/2022 1058   CREATININE 0.90 05/22/2022 1058   CREATININE 0.82 08/18/2012 0933   CALCIUM 9.6 05/22/2022 1058   GFRNONAA >60 06/01/2021 0316   GFRNONAA 78 08/18/2012 0933   GFRAA 76 03/14/2020 0000   GFRAA >  89 08/18/2012 0933   Lab Results  Component Value Date   HGBA1C 5.1 05/22/2022   HGBA1C 5.6 04/08/2019   Lab Results  Component Value Date   INSULIN 6.1 05/22/2022   INSULIN 12.1 04/08/2019   Lab Results  Component Value Date   TSH 2.680 05/22/2022   CBC    Component Value Date/Time   WBC 7.3 09/06/2021 1512   WBC 8.2 06/02/2021 0314   RBC 4.46 09/06/2021 1512   RBC 3.50 (L) 06/02/2021 0314   HGB 13.8 09/06/2021 1512    HCT 41.4 09/06/2021 1512   PLT 267 09/06/2021 1512   MCV 93 09/06/2021 1512   MCH 30.9 09/06/2021 1512   MCH 30.9 06/02/2021 0314   MCHC 33.3 09/06/2021 1512   MCHC 31.2 06/02/2021 0314   RDW 12.7 09/06/2021 1512   Iron Studies No results found for: "IRON", "TIBC", "FERRITIN", "IRONPCTSAT" Lipid Panel     Component Value Date/Time   CHOL 134 05/22/2022 1058   TRIG 126 05/22/2022 1058   HDL 53 05/22/2022 1058   LDLCALC 59 05/22/2022 1058   Hepatic Function Panel     Component Value Date/Time   PROT 6.9 05/22/2022 1058   ALBUMIN 4.5 05/22/2022 1058   AST 22 05/22/2022 1058   ALT 15 05/22/2022 1058   ALKPHOS 132 (H) 05/22/2022 1058   BILITOT 0.3 05/22/2022 1058      Component Value Date/Time   TSH 2.680 05/22/2022 1058   Nutritional Lab Results  Component Value Date   VD25OH 55.9 05/22/2022   VD25OH 39.1 12/11/2021   VD25OH 65.3 01/11/2021     Assessment and Plan    Obesity and Prediabetes Patient has lost 6 pounds in the past month and is adhering to category two plan approximately 80% of the time. She is also engaging in daily physical activity. -Continue category two plan and daily walking. -Consider switching to Zetband 5mg  vial from pen for cost-effectiveness. -Check with pharmacy regarding availability and instructions for use.  Vitamin D Deficiency Last checked in February with a level of 55. -Plan to check Vitamin D level at next visit.  Depression with Emotional Eating Behaviors Patient is currently on topiramate and Wellbutrin and reports doing well on these medications. -Refill topiramate and Wellbutrin prescriptions. -Continue monitoring for any potential side effects or issues.  Grade I diastolic dysfunction Recent echocardiogram showed grade one diastolic dysfunction and mild mitral valve regurgitation. Patient has been referred to a cardiologist. -Continue monitoring cardiac function and follow recommendations from cardiologist. Continue to  work on diet and exercise and weight loss  for heart health.         I have personally spent 44 minutes total time today in preparation, patient care, and documentation for this visit, including the following: review of clinical lab tests; review of medical tests/procedures/services.    She was informed of the importance of frequent follow up visits to maximize her success with intensive lifestyle modifications for her multiple health conditions.    Quillian Quince, MD

## 2022-11-27 NOTE — Telephone Encounter (Signed)
Patient called about Zepbound Vials. She stated that the website says it sends the prescription to [facility].

## 2022-12-02 DIAGNOSIS — M25569 Pain in unspecified knee: Secondary | ICD-10-CM | POA: Diagnosis not present

## 2022-12-09 DIAGNOSIS — M25569 Pain in unspecified knee: Secondary | ICD-10-CM | POA: Diagnosis not present

## 2022-12-16 DIAGNOSIS — M25569 Pain in unspecified knee: Secondary | ICD-10-CM | POA: Diagnosis not present

## 2022-12-17 ENCOUNTER — Ambulatory Visit: Payer: Medicare HMO | Admitting: Neurology

## 2022-12-17 DIAGNOSIS — G43709 Chronic migraine without aura, not intractable, without status migrainosus: Secondary | ICD-10-CM

## 2022-12-17 MED ORDER — ONABOTULINUMTOXINA 200 UNITS IJ SOLR
155.0000 [IU] | Freq: Once | INTRAMUSCULAR | Status: AC
Start: 2022-12-17 — End: 2022-12-17
  Administered 2022-12-17: 155 [IU] via INTRAMUSCULAR

## 2022-12-17 NOTE — Progress Notes (Signed)
Botox 200 units x 1 vial  LOT #: [id number] NDC: 4782-9562-13 EXP: 03/2025  B/B  Bacteriostatic 0.9% Sodium Chloride  LOT #: [id number] EXP: 01/31/2024 NDC: 4696-2952-84  Witnessed by Randa Evens

## 2022-12-17 NOTE — Progress Notes (Signed)
Consent Form Botulism Toxin Injection For Chronic Migraine   12/17/2022: doing great, stable 09/18/2022: stable 03/20/2022: stable  12/19/2021: stable 09/26/2021: stable. : Doing great, >>70% decrease migraines.   Reviewed orally with patient, additionally signature is on file:  Botulism toxin has been approved by the Federal drug administration for treatment of chronic migraine. Botulism toxin does not cure chronic migraine and it may not be effective in some patients.  The administration of botulism toxin is accomplished by injecting a small amount of toxin into the muscles of the neck and head. Dosage must be titrated for each individual. Any benefits resulting from botulism toxin tend to wear off after 3 months with a repeat injection required if benefit is to be maintained. Injections are usually done every 3-4 months with maximum effect peak achieved by about 2 or 3 weeks. Botulism toxin is expensive and you should be sure of what costs you will incur resulting from the injection.  The side effects of botulism toxin use for chronic migraine may include:   -Transient, and usually mild, facial weakness with facial injections  -Transient, and usually mild, head or neck weakness with head/neck injections  -Reduction or loss of forehead facial animation due to forehead muscle weakness  -Eyelid drooping  -Dry eye  -Pain at the site of injection or bruising at the site of injection  -Double vision  -Potential unknown long term risks  Contraindications: You should not have Botox if you are pregnant, nursing, allergic to albumin, have an infection, skin condition, or muscle weakness at the site of the injection, or have myasthenia gravis, Lambert-Eaton syndrome, or ALS.  It is also possible that as with any injection, there may be an allergic reaction or no effect from the medication. Reduced effectiveness after repeated injections is sometimes seen and rarely infection at the injection site  may occur. All care will be taken to prevent these side effects. If therapy is given over a long time, atrophy and wasting in the muscle injected may occur. Occasionally the patient's become refractory to treatment because they develop antibodies to the toxin. In this event, therapy needs to be modified.  I have read the above information and consent to the administration of botulism toxin.    BOTOX PROCEDURE NOTE FOR MIGRAINE HEADACHE    Contraindications and precautions discussed with patient(above). Aseptic procedure was observed and patient tolerated procedure. Procedure performed by Dr. Artemio Aly  The condition has existed for more than 6 months, and pt does not have a diagnosis of ALS, Myasthenia Gravis or Lambert-Eaton Syndrome.  Risks and benefits of injections discussed and pt agrees to proceed with the procedure.  Written consent obtained  These injections are medically necessary. Pt  receives good benefits from these injections. These injections do not cause sedations or hallucinations which the oral therapies may cause.  Description of procedure:  The patient was placed in a sitting position. The standard protocol was used for Botox as follows, with 5 units of Botox injected at each site:   -Procerus muscle, midline injection  -Corrugator muscle, bilateral injection  -Frontalis muscle, bilateral injection, with 2 sites each side, medial injection was performed in the upper one third of the frontalis muscle, in the region vertical from the medial inferior edge of the superior orbital rim. The lateral injection was again in the upper one third of the forehead vertically above the lateral limbus of the cornea, 1.5 cm lateral to the medial injection site.  -Temporalis muscle injection, 4 sites, bilaterally.  The first injection was 3 cm above the tragus of the ear, second injection site was 1.5 cm to 3 cm up from the first injection site in line with the tragus of the ear. The  third injection site was 1.5-3 cm forward between the first 2 injection sites. The fourth injection site was 1.5 cm posterior to the second injection site.   -Occipitalis muscle injection, 3 sites, bilaterally. The first injection was done one half way between the occipital protuberance and the tip of the mastoid process behind the ear. The second injection site was done lateral and superior to the first, 1 fingerbreadth from the first injection. The third injection site was 1 fingerbreadth superiorly and medially from the first injection site.  -Cervical paraspinal muscle injection, 2 sites, bilateral knee first injection site was 1 cm from the midline of the cervical spine, 3 cm inferior to the lower border of the occipital protuberance. The second injection site was 1.5 cm superiorly and laterally to the first injection site.  -Trapezius muscle injection was performed at 3 sites, bilaterally. The first injection site was in the upper trapezius muscle halfway between the inflection point of the neck, and the acromion. The second injection site was one half way between the acromion and the first injection site. The third injection was done between the first injection site and the inflection point of the neck.   Will return for repeat injection in 3 months.   155 units of Botox was used, 45u Botox not injected was wasted. The patient tolerated the procedure well, there were no complications of the above procedure.

## 2022-12-18 ENCOUNTER — Other Ambulatory Visit (INDEPENDENT_AMBULATORY_CARE_PROVIDER_SITE_OTHER): Payer: Self-pay | Admitting: Family Medicine

## 2022-12-18 DIAGNOSIS — E559 Vitamin D deficiency, unspecified: Secondary | ICD-10-CM

## 2022-12-19 DIAGNOSIS — U071 COVID-19: Secondary | ICD-10-CM | POA: Diagnosis not present

## 2022-12-20 ENCOUNTER — Other Ambulatory Visit (INDEPENDENT_AMBULATORY_CARE_PROVIDER_SITE_OTHER): Payer: Self-pay | Admitting: Family Medicine

## 2022-12-20 DIAGNOSIS — F3289 Other specified depressive episodes: Secondary | ICD-10-CM

## 2022-12-25 ENCOUNTER — Other Ambulatory Visit (INDEPENDENT_AMBULATORY_CARE_PROVIDER_SITE_OTHER): Payer: Self-pay | Admitting: Family Medicine

## 2022-12-25 DIAGNOSIS — R7303 Prediabetes: Secondary | ICD-10-CM

## 2022-12-26 ENCOUNTER — Other Ambulatory Visit (INDEPENDENT_AMBULATORY_CARE_PROVIDER_SITE_OTHER): Payer: Self-pay | Admitting: Family Medicine

## 2022-12-26 DIAGNOSIS — R7303 Prediabetes: Secondary | ICD-10-CM

## 2022-12-31 ENCOUNTER — Ambulatory Visit (INDEPENDENT_AMBULATORY_CARE_PROVIDER_SITE_OTHER): Payer: Medicare HMO | Admitting: Family Medicine

## 2022-12-31 ENCOUNTER — Encounter (INDEPENDENT_AMBULATORY_CARE_PROVIDER_SITE_OTHER): Payer: Self-pay | Admitting: Family Medicine

## 2022-12-31 VITALS — BP 95/67 | HR 82 | Temp 97.9°F | Ht 61.0 in | Wt 166.0 lb

## 2022-12-31 DIAGNOSIS — E669 Obesity, unspecified: Secondary | ICD-10-CM

## 2022-12-31 DIAGNOSIS — F3289 Other specified depressive episodes: Secondary | ICD-10-CM

## 2022-12-31 DIAGNOSIS — R7303 Prediabetes: Secondary | ICD-10-CM | POA: Diagnosis not present

## 2022-12-31 DIAGNOSIS — R632 Polyphagia: Secondary | ICD-10-CM | POA: Diagnosis not present

## 2022-12-31 DIAGNOSIS — Z6831 Body mass index (BMI) 31.0-31.9, adult: Secondary | ICD-10-CM

## 2022-12-31 DIAGNOSIS — E559 Vitamin D deficiency, unspecified: Secondary | ICD-10-CM

## 2022-12-31 MED ORDER — ZEPBOUND 5 MG/0.5ML ~~LOC~~ SOLN: 5.0000 mg | SUBCUTANEOUS | 0 refills | Status: DC

## 2022-12-31 MED ORDER — METFORMIN HCL 500 MG PO TABS
500.0000 mg | ORAL_TABLET | Freq: Every day | ORAL | 0 refills | Status: DC
Start: 2022-12-31 — End: 2022-12-31

## 2022-12-31 MED ORDER — METFORMIN HCL 500 MG PO TABS: 500.0000 mg | ORAL_TABLET | Freq: Two times a day (BID) | ORAL | 0 refills | Status: DC

## 2022-12-31 MED ORDER — VITAMIN D (ERGOCALCIFEROL) 1.25 MG (50000 UNIT) PO CAPS
ORAL_CAPSULE | ORAL | 0 refills | Status: DC
Start: 2022-12-31 — End: 2023-01-28

## 2022-12-31 MED ORDER — BUPROPION HCL ER (SR) 150 MG PO TB12
150.0000 mg | ORAL_TABLET | Freq: Two times a day (BID) | ORAL | 0 refills | Status: DC
Start: 2022-12-31 — End: 2023-01-28

## 2022-12-31 MED ORDER — TOPIRAMATE 25 MG PO TABS
25.0000 mg | ORAL_TABLET | Freq: Every day | ORAL | 0 refills | Status: DC
Start: 2022-12-31 — End: 2023-01-28

## 2022-12-31 NOTE — Addendum Note (Signed)
Addended by: Theola Sequin on: 12/31/2022 03:52 PM   Modules accepted: Orders

## 2022-12-31 NOTE — Progress Notes (Signed)
Chief Complaint:   OBESITY Melissa Leach is here to discuss her progress with her obesity treatment plan along with follow-up of her obesity related diagnoses. Melissa Leach is on the Category 2 Plan and states she is following her eating plan approximately 70% of the time. Melissa Leach states she is doing 0 minutes 0 times per week.  Today's visit was #: 47 Starting weight: 216 lbs Starting date: 04/08/2019 Today's weight: 166 lbs Today's date: 12/31/2022 Total lbs lost to date: 50 Total lbs lost since last in-office visit: 4  Interim History: Patient continues to work on her diet.  She has been sick with COVID and she is recovering slowly, so exercise has decreased for now.  Subjective:   1. Pre-diabetes Patient is on low dose of metformin with no side effects noted.  She is still working on her diet.  2. Vitamin D deficiency Patient is stable on vitamin D with no side effects noted, and she is due for labs.  3. Polyphagia Patient is on Zepbound, and she notes increased polyphagia after being sick and not eating much.  4. Emotional Eating Behavior Patient is stable on her medications, and she is working on decreasing emotional eating behavior but she has struggled recently after being sick.  Assessment/Plan:   1. Pre-diabetes We will check labs today.  Patient agreed to increase metformin to 500 mg twice daily, and we will refill for 1 month.  - metFORMIN (GLUCOPHAGE) 500 MG tablet; Take 1 tablet (500 mg total) by mouth twice daily .  Dispense: 60 tablet; Refill: 0 - Vitamin B12 - CMP14+EGFR - Lipid Panel With LDL/HDL Ratio - Insulin, random - Hemoglobin A1c  2. Vitamin D deficiency We will check labs today.  Patient will continue prescription vitamin D 50,000 IU once weekly, and we will refill for 1 month.  - Vitamin D, Ergocalciferol, (DRISDOL) 1.25 MG (50000 UNIT) CAPS capsule; TAKE 1 CAPSULE BY MOUTH ONE TIME PER WEEK  Dispense: 4 capsule; Refill: 0 - VITAMIN D 25 Hydroxy  (Vit-D Deficiency, Fractures)  3. Polyphagia Patient will continue Zepbound 5 mg once weekly, and we will refill for 1 month (prescription was sent to [facility] direct for cash pay).  She will get back to following her eating plan to decrease polyphagia.  - tirzepatide (ZEPBOUND) 5 MG/0.5ML injection vial; Inject 5 mg into the skin once a week.  Dispense: 2 mL; Refill: 0  4. Emotional Eating Behavior We will refill Topamax 25 mg once daily and Wellbutrin SR 150 mg twice daily for 1 month.  Patient will work on getting back to following her eating plan more closely to help decrease emotional eating behavior.  - topiramate (TOPAMAX) 25 MG tablet; Take 1 tablet (25 mg total) by mouth daily.  Dispense: 30 tablet; Refill: 0 - buPROPion (WELLBUTRIN SR) 150 MG 12 hr tablet; Take 1 tablet (150 mg total) by mouth 2 (two) times daily.  Dispense: 60 tablet; Refill: 0  5. BMI 31.0-31.9,adult  6. Obesity with starting BMI of 40.81 Melissa Leach is currently in the action stage of change. As such, her goal is to continue with weight loss efforts. She has agreed to the Category 2 Plan.   Exercise goals: All adults should avoid inactivity. Some physical activity is better than none, and adults who participate in any amount of physical activity gain some health benefits.  Behavioral modification strategies: increasing lean protein intake.  Melissa Leach has agreed to follow-up with our clinic in 4 weeks. She was informed of the  importance of frequent follow-up visits to maximize her success with intensive lifestyle modifications for her multiple health conditions.   Melissa Leach was informed we would discuss her lab results at her next visit unless there is a critical issue that needs to be addressed sooner. Melissa Leach agreed to keep her next visit at the agreed upon time to discuss these results.  Objective:   Blood pressure 95/67, pulse 82, temperature 97.9 F (36.6 C), height 5\' 1"  (1.549 m), weight 166 lb (75.3 kg), last  menstrual period 12/27/2002, SpO2 98%. Body mass index is 31.37 kg/m.  Lab Results  Component Value Date   CREATININE 0.90 05/22/2022   BUN 27 05/22/2022   NA 138 05/22/2022   K 4.5 05/22/2022   CL 104 05/22/2022   CO2 21 05/22/2022   Lab Results  Component Value Date   ALT 15 05/22/2022   AST 22 05/22/2022   ALKPHOS 132 (H) 05/22/2022   BILITOT 0.3 05/22/2022   Lab Results  Component Value Date   HGBA1C 5.1 05/22/2022   HGBA1C 5.5 12/11/2021   HGBA1C 5.2 05/29/2021   HGBA1C 5.4 01/11/2021   HGBA1C 5.3 05/29/2020   Lab Results  Component Value Date   INSULIN 6.1 05/22/2022   INSULIN 15.5 12/11/2021   INSULIN 9.1 01/11/2021   INSULIN 9.2 05/29/2020   INSULIN 8.3 11/01/2019   Lab Results  Component Value Date   TSH 2.680 05/22/2022   Lab Results  Component Value Date   CHOL 134 05/22/2022   HDL 53 05/22/2022   LDLCALC 59 05/22/2022   TRIG 126 05/22/2022   Lab Results  Component Value Date   VD25OH 55.9 05/22/2022   VD25OH 39.1 12/11/2021   VD25OH 65.3 01/11/2021   Lab Results  Component Value Date   WBC 7.3 09/06/2021   HGB 13.8 09/06/2021   HCT 41.4 09/06/2021   MCV 93 09/06/2021   PLT 267 09/06/2021   No results found for: "IRON", "TIBC", "FERRITIN"  Attestation Statements:   Reviewed by clinician on day of visit: allergies, medications, problem list, medical history, surgical history, family history, social history, and previous encounter notes.   I, Burt Knack, am acting as transcriptionist for Quillian Quince, MD.  I have reviewed the above documentation for accuracy and completeness, and I agree with the above. -  Quillian Quince, MD

## 2023-01-01 ENCOUNTER — Encounter (INDEPENDENT_AMBULATORY_CARE_PROVIDER_SITE_OTHER): Payer: Self-pay | Admitting: Family Medicine

## 2023-01-01 LAB — CMP14+EGFR
ALT: 21 [IU]/L (ref 0–32)
AST: 27 [IU]/L (ref 0–40)
Albumin: 4.3 g/dL (ref 3.8–4.8)
Alkaline Phosphatase: 106 [IU]/L (ref 44–121)
BUN/Creatinine Ratio: 21 (ref 12–28)
BUN: 24 mg/dL (ref 8–27)
Bilirubin Total: 0.4 mg/dL (ref 0.0–1.2)
CO2: 22 mmol/L (ref 20–29)
Calcium: 9.8 mg/dL (ref 8.7–10.3)
Chloride: 99 mmol/L (ref 96–106)
Creatinine, Ser: 1.12 mg/dL — ABNORMAL HIGH (ref 0.57–1.00)
Globulin, Total: 2.5 g/dL (ref 1.5–4.5)
Glucose: 84 mg/dL (ref 70–99)
Potassium: 4.7 mmol/L (ref 3.5–5.2)
Sodium: 135 mmol/L (ref 134–144)
Total Protein: 6.8 g/dL (ref 6.0–8.5)
eGFR: 53 mL/min/{1.73_m2} — ABNORMAL LOW (ref >59)

## 2023-01-01 LAB — LIPID PANEL WITH LDL/HDL RATIO
Cholesterol, Total: 125 mg/dL (ref 100–199)
HDL: 40 mg/dL (ref >39)
LDL Chol Calc (NIH): 65 mg/dL (ref 0–99)
LDL/HDL Ratio: 1.6 {ratio} (ref 0.0–3.2)
Triglycerides: 108 mg/dL (ref 0–149)
VLDL Cholesterol Cal: 20 mg/dL (ref 5–40)

## 2023-01-01 LAB — HEMOGLOBIN A1C
Est. average glucose Bld gHb Est-mCnc: 105 mg/dL
Hgb A1c MFr Bld: 5.3 % (ref 4.8–5.6)

## 2023-01-01 LAB — VITAMIN D 25 HYDROXY (VIT D DEFICIENCY, FRACTURES): Vit D, 25-Hydroxy: 109 ng/mL — ABNORMAL HIGH (ref 30.0–100.0)

## 2023-01-01 LAB — INSULIN, RANDOM: INSULIN: 5.7 u[IU]/mL (ref 2.6–24.9)

## 2023-01-01 LAB — VITAMIN B12: Vitamin B-12: 1284 pg/mL — ABNORMAL HIGH (ref 232–1245)

## 2023-01-22 ENCOUNTER — Other Ambulatory Visit (INDEPENDENT_AMBULATORY_CARE_PROVIDER_SITE_OTHER): Payer: Self-pay | Admitting: Family Medicine

## 2023-01-22 DIAGNOSIS — F3289 Other specified depressive episodes: Secondary | ICD-10-CM

## 2023-01-28 ENCOUNTER — Encounter (INDEPENDENT_AMBULATORY_CARE_PROVIDER_SITE_OTHER): Payer: Self-pay | Admitting: Family Medicine

## 2023-01-28 ENCOUNTER — Ambulatory Visit (INDEPENDENT_AMBULATORY_CARE_PROVIDER_SITE_OTHER): Payer: Medicare HMO | Admitting: Family Medicine

## 2023-01-28 VITALS — BP 94/56 | HR 97 | Temp 97.6°F | Ht 61.0 in | Wt 159.0 lb

## 2023-01-28 DIAGNOSIS — E559 Vitamin D deficiency, unspecified: Secondary | ICD-10-CM | POA: Diagnosis not present

## 2023-01-28 DIAGNOSIS — R632 Polyphagia: Secondary | ICD-10-CM | POA: Diagnosis not present

## 2023-01-28 DIAGNOSIS — E669 Obesity, unspecified: Secondary | ICD-10-CM

## 2023-01-28 DIAGNOSIS — Z6830 Body mass index (BMI) 30.0-30.9, adult: Secondary | ICD-10-CM | POA: Diagnosis not present

## 2023-01-28 DIAGNOSIS — K117 Disturbances of salivary secretion: Secondary | ICD-10-CM | POA: Diagnosis not present

## 2023-01-28 DIAGNOSIS — R7303 Prediabetes: Secondary | ICD-10-CM

## 2023-01-28 DIAGNOSIS — F3289 Other specified depressive episodes: Secondary | ICD-10-CM

## 2023-01-28 MED ORDER — METFORMIN HCL 500 MG PO TABS
500.0000 mg | ORAL_TABLET | Freq: Two times a day (BID) | ORAL | 1 refills | Status: DC
Start: 2023-01-28 — End: 2023-03-18

## 2023-01-28 MED ORDER — BUPROPION HCL ER (SR) 150 MG PO TB12
150.0000 mg | ORAL_TABLET | Freq: Two times a day (BID) | ORAL | 0 refills | Status: DC
Start: 2023-01-28 — End: 2023-01-28

## 2023-01-28 MED ORDER — TOPIRAMATE 25 MG PO TABS: 25.0000 mg | ORAL_TABLET | Freq: Every day | ORAL | 1 refills | Status: DC

## 2023-01-28 MED ORDER — TOPIRAMATE 25 MG PO TABS
25.0000 mg | ORAL_TABLET | Freq: Every day | ORAL | 0 refills | Status: DC
Start: 2023-01-28 — End: 2023-01-28

## 2023-01-28 MED ORDER — ZEPBOUND 5 MG/0.5ML ~~LOC~~ SOLN: 5.0000 mg | SUBCUTANEOUS | 1 refills | Status: DC

## 2023-01-28 MED ORDER — VITAMIN D (ERGOCALCIFEROL) 1.25 MG (50000 UNIT) PO CAPS
ORAL_CAPSULE | ORAL | 1 refills | Status: DC
Start: 2023-01-28 — End: 2023-05-15

## 2023-01-28 MED ORDER — BUPROPION HCL ER (SR) 150 MG PO TB12
150.0000 mg | ORAL_TABLET | Freq: Two times a day (BID) | ORAL | 1 refills | Status: DC
Start: 2023-01-28 — End: 2023-03-18

## 2023-01-28 MED ORDER — VITAMIN D (ERGOCALCIFEROL) 1.25 MG (50000 UNIT) PO CAPS
ORAL_CAPSULE | ORAL | 0 refills | Status: DC
Start: 2023-01-28 — End: 2023-01-28

## 2023-01-28 MED ORDER — METFORMIN HCL 500 MG PO TABS
500.0000 mg | ORAL_TABLET | Freq: Two times a day (BID) | ORAL | 0 refills | Status: DC
Start: 2023-01-28 — End: 2023-01-28

## 2023-01-28 NOTE — Progress Notes (Unsigned)
Chief Complaint:   OBESITY Melissa Leach is here to discuss her progress with her obesity treatment plan along with follow-up of her obesity related diagnoses. Melissa Leach is on the Category 2 Plan and states she is following her eating plan approximately 90% of the time. Melissa Leach states she is walking for 20-30 minutes.  Today's visit was #: 48 Starting weight: 216 lbs Starting date: 04/08/2019 Today's weight: 159 lbs Today's date: 01/28/2023 Total lbs lost to date: 57 Total lbs lost since last in-office visit: 7  Interim History: Patient is doing well with her diet and weight loss.  She is working on increasing her protein and meal planning.  Her hunger is controlled.  Subjective:   1. Melissa Leach Patient notes increased Melissa Leach in the last 3 weeks, worse after using an inhaler for exercise-induced asthma.  She has no signs of thrush.  2. Melissa Leach Patient is stable on her medications, and she denies nausea or vomiting.  She is working on her diet.  3. Melissa Leach Patient is working on her diet and weight loss.  No GI issues with metformin were noted, and she denies hypoglycemia.  4. Melissa Leach Patient is on Melissa D prescription.  She denies nausea, vomiting, or muscle weakness.  5. Melissa Leach Patient is stable on her medications, and she is working on decreasing Melissa Leach.  Assessment/Plan:   1. Melissa Leach Patient is to hold her inhaler for 2 weeks to see if there is any difference; may be communicative from Wellbutrin and her inhaler.  2. Melissa Leach Patient will continue Zepbound 5 mg once weekly, and we will refill for 2 months.  She will continue with her diet and exercise.  - tirzepatide (ZEPBOUND) 5 MG/0.5ML injection vial; Inject 5 mg into the skin once a week. Dispense: 2 mL; Refill: 1   3. Melissa Leach Patient will continue her medications, and we will refill metformin 500 mg twice daily for 2 months.  - metFORMIN  (GLUCOPHAGE) 500 MG tablet; Take 1 tablet (500 mg total) by mouth 2 (two) times daily with a meal.  Dispense: 60 tablet; Refill: 1  4. Melissa Leach Patient will continue her once weekly prescription Melissa D, and we will refill for 2 months.  - Melissa D, Ergocalciferol, (DRISDOL) 1.25 MG (50000 UNIT) CAPS capsule; TAKE 1 CAPSULE BY MOUTH ONE TIME PER WEEK  Dispense: 4 capsule; Refill: 1  5. Melissa Leach Patient will continue her medications, and we will refill Wellbutrin SR and Topamax for 2 months.  - buPROPion (WELLBUTRIN SR) 150 MG 12 hr tablet; Take 1 tablet (150 mg total) by mouth 2 (two) times daily.  Dispense: 60 tablet; Refill: 1 - topiramate (TOPAMAX) 25 MG tablet; Take 1 tablet (25 mg total) by mouth daily.  Dispense: 30 tablet; Refill: 1  6. BMI 30.0-30.9,adult  7. Obesity with starting BMI of 40.81 Melissa Leach is currently in the action stage of change. As such, her goal is to continue with weight loss efforts. She has agreed to the Category 2 Plan.   Exercise goals: As is.   Behavioral modification strategies: increasing lean protein intake and meal planning and cooking strategies.  Melissa Leach has agreed to follow-up with our clinic in 6 to 8 weeks. She was informed of the importance of frequent follow-up visits to maximize her success with intensive lifestyle modifications for her multiple health conditions.   Objective:   Blood pressure (!) 94/56, pulse 97, temperature 97.6 F (36.4 C), height 5\' 1"  (1.549 m),  weight 159 lb (72.1 kg), last menstrual period 12/27/2002, SpO2 (!) 86%. Body mass index is 30.04 kg/m.  Lab Results  Component Value Date   CREATININE 1.12 (H) 12/31/2022   BUN 24 12/31/2022   NA 135 12/31/2022   K 4.7 12/31/2022   CL 99 12/31/2022   CO2 22 12/31/2022   Lab Results  Component Value Date   ALT 21 12/31/2022   AST 27 12/31/2022   ALKPHOS 106 12/31/2022   BILITOT 0.4 12/31/2022   Lab Results  Component Value Date    HGBA1C 5.3 12/31/2022   HGBA1C 5.1 05/22/2022   HGBA1C 5.5 12/11/2021   HGBA1C 5.2 05/29/2021   HGBA1C 5.4 01/11/2021   Lab Results  Component Value Date   INSULIN 5.7 12/31/2022   INSULIN 6.1 05/22/2022   INSULIN 15.5 12/11/2021   INSULIN 9.1 01/11/2021   INSULIN 9.2 05/29/2020   Lab Results  Component Value Date   TSH 2.680 05/22/2022   Lab Results  Component Value Date   CHOL 125 12/31/2022   HDL 40 12/31/2022   LDLCALC 65 12/31/2022   TRIG 108 12/31/2022   Lab Results  Component Value Date   VD25OH 109.0 (H) 12/31/2022   VD25OH 55.9 05/22/2022   VD25OH 39.1 12/11/2021   Lab Results  Component Value Date   WBC 7.3 09/06/2021   HGB 13.8 09/06/2021   HCT 41.4 09/06/2021   MCV 93 09/06/2021   PLT 267 09/06/2021   No results found for: "IRON", "TIBC", "FERRITIN"  Attestation Statements:   Reviewed by clinician on day of visit: allergies, medications, problem list, medical history, surgical history, family history, social history, and previous encounter notes.   I, Burt Knack, am acting as transcriptionist for Quillian Quince, MD.  I have reviewed the above documentation for accuracy and completeness, and I agree with the above. -  Quillian Quince, MD

## 2023-02-02 DIAGNOSIS — J018 Other acute sinusitis: Secondary | ICD-10-CM | POA: Diagnosis not present

## 2023-02-02 DIAGNOSIS — R110 Nausea: Secondary | ICD-10-CM | POA: Diagnosis not present

## 2023-02-02 DIAGNOSIS — B370 Candidal stomatitis: Secondary | ICD-10-CM | POA: Diagnosis not present

## 2023-02-02 DIAGNOSIS — Z6829 Body mass index (BMI) 29.0-29.9, adult: Secondary | ICD-10-CM | POA: Diagnosis not present

## 2023-02-10 ENCOUNTER — Telehealth: Payer: Self-pay | Admitting: Neurology

## 2023-02-10 NOTE — Telephone Encounter (Signed)
New auth needed before 03/13/23 Botox appt. Completed [facility] renewal form and faxed along with OV notes.

## 2023-02-19 NOTE — Telephone Encounter (Signed)
Called [facility] because I have not heard back regarding auth. Rep states that Berkley Harvey was approved, pt will continue to be buy/bill. Reference # for today's call is [id number].  Auth#: [id number] (02/10/23-08/10/23)

## 2023-03-06 ENCOUNTER — Other Ambulatory Visit (INDEPENDENT_AMBULATORY_CARE_PROVIDER_SITE_OTHER): Payer: Self-pay | Admitting: Family Medicine

## 2023-03-10 ENCOUNTER — Encounter (HOSPITAL_BASED_OUTPATIENT_CLINIC_OR_DEPARTMENT_OTHER): Payer: Self-pay | Admitting: Cardiovascular Disease

## 2023-03-10 ENCOUNTER — Ambulatory Visit (HOSPITAL_BASED_OUTPATIENT_CLINIC_OR_DEPARTMENT_OTHER): Payer: Medicare HMO | Admitting: Cardiovascular Disease

## 2023-03-10 VITALS — BP 124/78 | HR 88 | Ht 61.0 in | Wt 154.2 lb

## 2023-03-10 DIAGNOSIS — E669 Obesity, unspecified: Secondary | ICD-10-CM | POA: Diagnosis not present

## 2023-03-10 DIAGNOSIS — R0609 Other forms of dyspnea: Secondary | ICD-10-CM

## 2023-03-10 DIAGNOSIS — I5189 Other ill-defined heart diseases: Secondary | ICD-10-CM

## 2023-03-10 DIAGNOSIS — E66813 Obesity, class 3: Secondary | ICD-10-CM

## 2023-03-10 DIAGNOSIS — Z6841 Body Mass Index (BMI) 40.0 and over, adult: Secondary | ICD-10-CM | POA: Diagnosis not present

## 2023-03-10 MED ORDER — METOPROLOL TARTRATE 100 MG PO TABS: ORAL_TABLET | ORAL | 0 refills | Status: DC

## 2023-03-10 NOTE — Progress Notes (Signed)
Cardiology Office Note:  .   Date:  03/10/2023  ID:  Melissa Leach, DOB [birth year], MRN [id number] PCP: Mila Palmer, MD  [facility] Providers Cardiologist:  None    History of Present Illness: .    Melissa Leach is a 72 y.o. female with aortic atherosclerosis, GERD, emphysema, depression, metabolic syndrome, and morbid obesity here for evaluation of mitral valve disorder.  Melissa Leach saw her PCP, Dr. Paulino Rily and noted exertional dyspnea.  Prior x-ray revealed emphysematous changes in the lung apices 05/2022.  She was also noted to have aortic atherosclerosis.  She was referred for an echo 10/2022 that revealed LVEF 60-65% with grade 1 diastolic dysfunction and mild to moderate mitral regurgitation.  Melissa Leach presents with exercise-induced shortness of breath. The dyspnea was first noticed during a trip to [place], where the patient's children, both asthmatic, identified wheezing during uphill walks. The patient has been using an inhaler, prescribed by her family doctor, to manage the emphysema. However, she reports no noticeable improvement in her breathing since starting the inhaler.  She also reports a persistent cough, which she does not notice, but has been pointed out by her children. The cough is non-productive, but the patient does report significant nasal drainage, suggestive of allergies.  She has a history of smoking, starting at a young age and continuing intermittently until she quit completely several years ago. She has been actively working on weight loss, having lost a significant amount from a previous weight of 216 lbs. She reports no chest pain or pressure during exercise.  She has a history of multiple joint replacements, includingl knee replacements, two hip replacements, and two shoulder replacements. She reports no issues with swelling in her legs or feet, except for occasional swelling in one leg due to a short calf muscle, as explained by her physical  therapist.  She underwent a heart catheterization prior to her first knee replacement due to arm pain. The results showed no blockages and the patient's heart was reported to be in excellent condition. The patient also underwent an echocardiogram recently, the results of which led her family doctor to recommend a consultation with a cardiologist. The patient is currently on atorvastatin for cholesterol management. Her LDL cholesterol is well-controlled at 65.     ROS:  As per HPI  Studies Reviewed: Marland Kitchen   EKG Interpretation Date/Time:  Monday March 10 2023 09:47:56 EST Ventricular Rate:  80 PR Interval:  138 QRS Duration:  70 QT Interval:  368 QTC Calculation: 424 R Axis:   27  Text Interpretation: Normal sinus rhythm Normal ECG No significant change since last tracing Confirmed by Chilton Si ([id number]) on 03/10/2023 9:57:07 AM   Echo 11/04/22:   1. Left ventricular ejection fraction, by estimation, is 60 to 65%. The  left ventricle has normal function. The left ventricle has no regional  wall motion abnormalities. Left ventricular diastolic parameters are  consistent with Grade I diastolic  dysfunction (impaired relaxation). The average left ventricular global  longitudinal strain is -18.9 %. The global longitudinal strain is normal.   2. Right ventricular systolic function is normal. The right ventricular  size is normal. There is normal pulmonary artery systolic pressure.   3. The mitral valve is normal in structure. Mild to moderate mitral valve  regurgitation. No evidence of mitral stenosis.   4. The aortic valve is normal in structure. Aortic valve regurgitation is  mild. No aortic stenosis is present. Aortic regurgitation PHT measures 474  msec.  5. The inferior vena cava is normal in size with greater than 50%  respiratory variability, suggesting right atrial pressure of 3 mmHg.   Risk Assessment/Calculations:             Physical Exam:   VS:  BP 124/78   Pulse 88    Ht 5\' 1"  (1.549 m)   Wt 154 lb 3.2 oz (69.9 kg)   LMP 12/27/2002   SpO2 99%   BMI 29.14 kg/m  , BMI Body mass index is 29.14 kg/m. GENERAL:  Well appearing HEENT: Pupils equal round and reactive, fundi not visualized, oral mucosa unremarkable NECK:  No jugular venous distention, waveform within normal limits, carotid upstroke brisk and symmetric, no bruits, no thyromegaly LUNGS:  Clear to auscultation bilaterally HEART:  RRR.  PMI not displaced or sustained,S1 and S2 within normal limits, no S3, no S4, no clicks, no rubs, no murmurs ABD:  Flat, positive bowel sounds normal in frequency in pitch, no bruits, no rebound, no guarding, no midline pulsatile mass, no hepatomegaly, no splenomegaly EXT:  2 plus pulses throughout, no edema, no cyanosis no clubbing SKIN:  No rashes no nodules NEURO:  Cranial nerves II through XII grossly intact, motor grossly intact throughout PSYCH:  Cognitively intact, oriented to person place and time   ASSESSMENT AND PLAN: .    # Exercise-induced shortness of breath: Possible asthma or emphysema. Inhaler use has not improved symptoms. Chronic cough with postnasal drainage. No significant improvement with Zyrtec. -Order coronary CT-A for possible coronary artery disease contributing to shortness of breath.  # Mild to moderate mitral regurgitation No symptoms of heart failure. Ejection fraction is normal. Grade 1 diastolic dysfunction noted, which is common in this age group. -Repeat echocardiogram in 1 year to monitor for stability.  # Aortic atherosclerosis # Hyperlipidemia Currently on atorvastatin with excellent LDL control. -Continue atorvastatin.  General Health Maintenance History of smoking and current weight loss efforts. -Encourage continued weight loss and smoking cessation.      Dispo: f/u with Melissa Leach C. Duke Salvia, MD, [facility]  in 1 year  Signed, Chilton Si, MD

## 2023-03-10 NOTE — Patient Instructions (Signed)
Medication Instructions:  TAKE METOPROLOL 100 MG 2 HOURS PRIOR TO CT  *If you need a refill on your cardiac medications before your next appointment, please call your pharmacy*  Lab Work: BMET 1 WEEK PRIOR TO CT  If you have labs (blood work) drawn today and your tests are completely normal, you will receive your results only by: MyChart Message (if you have MyChart) OR A paper copy in the mail If you have any lab test that is abnormal or we need to change your treatment, we will call you to review the results.  Testing/Procedures: Your physician has requested that you have cardiac CT. Cardiac computed tomography (CT) is a painless test that uses an x-ray machine to take clear, detailed pictures of your heart. For further information please visit [email or web address] Please follow instruction sheet as given. THE OFFICE WILL CALL YOU TO SCHEDULE ONCE INSURANCE HAS BEEN REVIEWED   Your physician has requested that you have an echocardiogram. Echocardiography is a painless test that uses sound waves to create images of your heart. It provides your doctor with information about the size and shape of your heart and how well your heart's chambers and valves are working. This procedure takes approximately one hour. There are no restrictions for this procedure. Please do NOT wear cologne, perfume, aftershave, or lotions (deodorant is allowed). Please arrive 15 minutes prior to your appointment time.  Please note: We ask at that you not bring children with you during ultrasound (echo/ vascular) testing. Due to room size and safety concerns, children are not allowed in the ultrasound rooms during exams. Our front office staff cannot provide observation of children in our lobby area while testing is being conducted. An adult accompanying a patient to their appointment will only be allowed in the ultrasound room at the discretion of the ultrasound technician under special circumstances. We apologize for any  inconvenience. TO BE DONE IN 1 YEAR   Follow-Up: At [facility], you and your health needs are our priority.  As part of our continuing mission to provide you with exceptional heart care, we have created designated Provider Care Teams.  These Care Teams include your primary Cardiologist (physician) and Advanced Practice Providers (APPs -  Physician Assistants and Nurse Practitioners) who all work together to provide you with the care you need, when you need it.  We recommend signing up for the patient portal called "MyChart".  Sign up information is provided on this After Visit Summary.  MyChart is used to connect with patients for Virtual Visits (Telemedicine).  Patients are able to view lab/test results, encounter notes, upcoming appointments, etc.  Non-urgent messages can be sent to your provider as well.   To learn more about what you can do with MyChart, go to [email or web address].    Your next appointment:   12 month(s)  Provider:   Chilton Si, MD or Gillian Shields, NP     Other Instructions    Your cardiac CT will be scheduled at one of the below locations:   [facility] [address] [place], [place] [address] [phone]  OR  [facility] [address] [place], [place] [address] [phone]  OR   [facility] [address] [place], [place] [address] [phone]  If scheduled at [facility], please arrive at the [facility] and Children's Entrance (Entrance C2) of [facility] 30 minutes prior to test start time. You can use the FREE valet  parking offered at entrance C (encouraged to control the heart rate for the test)  Proceed to the [facility] Radiology Department (first floor) to check-in and test prep.  All radiology patients and guests should use entrance C2 at [facility], accessed from [facility], even though the  hospital's physical address listed is [address].    If scheduled at [facility] or [facility], please arrive 15 mins early for check-in and test prep.  There is spacious parking and easy access to the radiology department from the [facility] Heart and Vascular entrance. Please enter here and check-in with the desk attendant.   Please follow these instructions carefully (unless otherwise directed):  An IV will be required for this test and Nitroglycerin will be given.  Hold all erectile dysfunction medications at least 3 days (72 hrs) prior to test. (Ie viagra, cialis, sildenafil, tadalafil, etc)   On the Night Before the Test: Be sure to Drink plenty of water. Do not consume any caffeinated/decaffeinated beverages or chocolate 12 hours prior to your test. Do not take any antihistamines 12 hours prior to your test. If the patient has contrast allergy: Patient will need a prescription for Prednisone and very clear instructions (as follows): Prednisone 50 mg - take 13 hours prior to test Take another Prednisone 50 mg 7 hours prior to test Take another Prednisone 50 mg 1 hour prior to test Take Benadryl 50 mg 1 hour prior to test Patient must complete all four doses of above prophylactic medications. Patient will need a ride after test due to Benadryl.  On the Day of the Test: Drink plenty of water until 1 hour prior to the test. Do not eat any food 1 hour prior to test. You may take your regular medications prior to the test.  Take metoprolol (Lopressor) two hours prior to test. If you take Furosemide/Hydrochlorothiazide/Spironolactone/Chlorthalidone, please HOLD on the morning of the test. FEMALES- please wear underwire-free bra if available, avoid dresses & tight clothing      After the Test: Drink plenty of water. After receiving IV contrast, you may experience a mild flushed feeling. This is normal. On occasion, you may  experience a mild rash up to 24 hours after the test. This is not dangerous. If this occurs, you can take Benadryl 25 mg and increase your fluid intake. If you experience trouble breathing, this can be serious. If it is severe call 911 IMMEDIATELY. If it is mild, please call our office.  We will call to schedule your test 2-4 weeks out understanding that some insurance companies will need an authorization prior to the service being performed.   For more information and frequently asked questions, please visit our website : [email or web address]  For non-scheduling related questions, please contact the cardiac imaging nurse navigator should you have any questions/concerns: Cardiac Imaging Nurse Navigators Direct Office Dial: [phone]   For scheduling needs, including cancellations and rescheduling, please call [place], [phone].

## 2023-03-11 ENCOUNTER — Other Ambulatory Visit (INDEPENDENT_AMBULATORY_CARE_PROVIDER_SITE_OTHER): Payer: Self-pay | Admitting: Family Medicine

## 2023-03-11 DIAGNOSIS — E669 Obesity, unspecified: Secondary | ICD-10-CM

## 2023-03-13 ENCOUNTER — Ambulatory Visit: Payer: Medicare HMO | Admitting: Neurology

## 2023-03-13 DIAGNOSIS — G43709 Chronic migraine without aura, not intractable, without status migrainosus: Secondary | ICD-10-CM | POA: Diagnosis not present

## 2023-03-13 MED ORDER — ONABOTULINUMTOXINA 200 UNITS IJ SOLR
155.0000 [IU] | Freq: Once | INTRAMUSCULAR | Status: AC
  Administered 2023-03-13: 155 [IU] via INTRAMUSCULAR

## 2023-03-13 NOTE — Progress Notes (Signed)
Consent Form Botulism Toxin Injection For Chronic Migraine  03/13/2023: stable, doing great. Patient feels that her migraines cause her jaw to ache in her jaw aching also can cause her migraines to worsen it is a trigger for migraines included 10 units in each masseter to see if that helps with migraine severity.  12/17/2022: doing great, stable 09/18/2022: stable 03/20/2022: stable  12/19/2021: stable 09/26/2021: stable. : Doing great, >>70% decrease migraines.   Reviewed orally with patient, additionally signature is on file:  Botulism toxin has been approved by the Federal drug administration for treatment of chronic migraine. Botulism toxin does not cure chronic migraine and it may not be effective in some patients.  The administration of botulism toxin is accomplished by injecting a small amount of toxin into the muscles of the neck and head. Dosage must be titrated for each individual. Any benefits resulting from botulism toxin tend to wear off after 3 months with a repeat injection required if benefit is to be maintained. Injections are usually done every 3-4 months with maximum effect peak achieved by about 2 or 3 weeks. Botulism toxin is expensive and you should be sure of what costs you will incur resulting from the injection.  The side effects of botulism toxin use for chronic migraine may include:   -Transient, and usually mild, facial weakness with facial injections  -Transient, and usually mild, head or neck weakness with head/neck injections  -Reduction or loss of forehead facial animation due to forehead muscle weakness  -Eyelid drooping  -Dry eye  -Pain at the site of injection or bruising at the site of injection  -Double vision  -Potential unknown long term risks  Contraindications: You should not have Botox if you are pregnant, nursing, allergic to albumin, have an infection, skin condition, or muscle weakness at the site of the injection, or have myasthenia gravis,  Lambert-Eaton syndrome, or ALS.  It is also possible that as with any injection, there may be an allergic reaction or no effect from the medication. Reduced effectiveness after repeated injections is sometimes seen and rarely infection at the injection site may occur. All care will be taken to prevent these side effects. If therapy is given over a long time, atrophy and wasting in the muscle injected may occur. Occasionally the patient's become refractory to treatment because they develop antibodies to the toxin. In this event, therapy needs to be modified.  I have read the above information and consent to the administration of botulism toxin.    BOTOX PROCEDURE NOTE FOR MIGRAINE HEADACHE    Contraindications and precautions discussed with patient(above). Aseptic procedure was observed and patient tolerated procedure. Procedure performed by Dr. Artemio Aly  The condition has existed for more than 6 months, and pt does not have a diagnosis of ALS, Myasthenia Gravis or Lambert-Eaton Syndrome.  Risks and benefits of injections discussed and pt agrees to proceed with the procedure.  Written consent obtained  These injections are medically necessary. Pt  receives good benefits from these injections. These injections do not cause sedations or hallucinations which the oral therapies may cause.  Description of procedure:  The patient was placed in a sitting position. The standard protocol was used for Botox as follows, with 5 units of Botox injected at each site:   -Procerus muscle, midline injection  -Corrugator muscle, bilateral injection  -Frontalis muscle, bilateral injection, with 2 sites each side, medial injection was performed in the upper one third of the frontalis muscle, in the region vertical from  the medial inferior edge of the superior orbital rim. The lateral injection was again in the upper one third of the forehead vertically above the lateral limbus of the cornea, 1.5 cm lateral  to the medial injection site.  -Temporalis muscle injection, 4 sites, bilaterally. The first injection was 3 cm above the tragus of the ear, second injection site was 1.5 cm to 3 cm up from the first injection site in line with the tragus of the ear. The third injection site was 1.5-3 cm forward between the first 2 injection sites. The fourth injection site was 1.5 cm posterior to the second injection site.   -Occipitalis muscle injection, 3 sites, bilaterally. The first injection was done one half way between the occipital protuberance and the tip of the mastoid process behind the ear. The second injection site was done lateral and superior to the first, 1 fingerbreadth from the first injection. The third injection site was 1 fingerbreadth superiorly and medially from the first injection site.  -Cervical paraspinal muscle injection, 2 sites, bilateral knee first injection site was 1 cm from the midline of the cervical spine, 3 cm inferior to the lower border of the occipital protuberance. The second injection site was 1.5 cm superiorly and laterally to the first injection site.  -Trapezius muscle injection was performed at 3 sites, bilaterally. The first injection site was in the upper trapezius muscle halfway between the inflection point of the neck, and the acromion. The second injection site was one half way between the acromion and the first injection site. The third injection was done between the first injection site and the inflection point of the neck.   Will return for repeat injection in 3 months.   155 units of Botox was used, 45u Botox not injected was wasted. The patient tolerated the procedure well, there were no complications of the above procedure.

## 2023-03-13 NOTE — Progress Notes (Signed)
Botox- 200 units x 1 vial Lot: [id number] Expiration: 05/2025 NDC: 4782-9562-13  Bacteriostatic 0.9% Sodium Chloride- 4 mL  Lot: [id number] Expiration: 07/01/2023 NDC: 4696-2952-84  Dx: X32.440 B/B Witnessed by Joellen Jersey RN

## 2023-03-14 ENCOUNTER — Encounter (HOSPITAL_BASED_OUTPATIENT_CLINIC_OR_DEPARTMENT_OTHER): Payer: Self-pay | Admitting: Cardiovascular Disease

## 2023-03-14 DIAGNOSIS — R0609 Other forms of dyspnea: Secondary | ICD-10-CM | POA: Diagnosis not present

## 2023-03-15 LAB — BASIC METABOLIC PANEL
BUN/Creatinine Ratio: 18 (ref 12–28)
BUN: 17 mg/dL (ref 8–27)
CO2: 19 mmol/L — ABNORMAL LOW (ref 20–29)
Calcium: 10.1 mg/dL (ref 8.7–10.3)
Chloride: 104 mmol/L (ref 96–106)
Creatinine, Ser: 0.92 mg/dL (ref 0.57–1.00)
Glucose: 87 mg/dL (ref 70–99)
Potassium: 4.7 mmol/L (ref 3.5–5.2)
Sodium: 141 mmol/L (ref 134–144)
eGFR: 67 mL/min/{1.73_m2} (ref >59)

## 2023-03-18 ENCOUNTER — Ambulatory Visit (INDEPENDENT_AMBULATORY_CARE_PROVIDER_SITE_OTHER): Payer: Medicare HMO | Admitting: Family Medicine

## 2023-03-18 ENCOUNTER — Encounter (INDEPENDENT_AMBULATORY_CARE_PROVIDER_SITE_OTHER): Payer: Self-pay | Admitting: Family Medicine

## 2023-03-18 VITALS — BP 101/65 | HR 60 | Temp 98.0°F | Ht 61.0 in | Wt 147.0 lb

## 2023-03-18 DIAGNOSIS — E039 Hypothyroidism, unspecified: Secondary | ICD-10-CM | POA: Diagnosis not present

## 2023-03-18 DIAGNOSIS — F5089 Other specified eating disorder: Secondary | ICD-10-CM

## 2023-03-18 DIAGNOSIS — E559 Vitamin D deficiency, unspecified: Secondary | ICD-10-CM | POA: Diagnosis not present

## 2023-03-18 DIAGNOSIS — F3289 Other specified depressive episodes: Secondary | ICD-10-CM

## 2023-03-18 DIAGNOSIS — R7303 Prediabetes: Secondary | ICD-10-CM

## 2023-03-18 DIAGNOSIS — Z6827 Body mass index (BMI) 27.0-27.9, adult: Secondary | ICD-10-CM

## 2023-03-18 DIAGNOSIS — E669 Obesity, unspecified: Secondary | ICD-10-CM | POA: Diagnosis not present

## 2023-03-18 MED ORDER — BUPROPION HCL ER (SR) 150 MG PO TB12: 150.0000 mg | ORAL_TABLET | Freq: Two times a day (BID) | ORAL | 1 refills | Status: DC

## 2023-03-18 MED ORDER — ZEPBOUND 5 MG/0.5ML ~~LOC~~ SOLN: 5.0000 mg | SUBCUTANEOUS | 1 refills | Status: DC

## 2023-03-18 MED ORDER — TOPIRAMATE 25 MG PO TABS: 25.0000 mg | ORAL_TABLET | Freq: Every day | ORAL | 1 refills | Status: DC

## 2023-03-18 MED ORDER — METFORMIN HCL 500 MG PO TABS: 500.0000 mg | ORAL_TABLET | Freq: Two times a day (BID) | ORAL | 1 refills | Status: DC

## 2023-03-18 NOTE — Progress Notes (Signed)
.smr  Office: [phone]  /  Fax: [phone]  WEIGHT SUMMARY AND BIOMETRICS  Anthropometric Measurements Height: 5\' 1"  (1.549 m) Weight: 147 lb (66.7 kg) BMI (Calculated): 27.79 Weight at Last Visit: 159 lb Weight Lost Since Last Visit: 12 lb Weight Gained Since Last Visit: 0 Starting Weight: 216 lb   Body Composition  Body Fat %: 40.5 % Fat Mass (lbs): 59.6 lbs Muscle Mass (lbs): 83.2 lbs Total Body Water (lbs): 60 lbs Visceral Fat Rating : 11   Other Clinical Data Fasting: Yes Labs: Yes Today's Visit #: 42 Starting Date: 04/08/19    Chief Complaint: OBESITY   History of Present Illness   The patient, with a history of vitamin D deficiency, emotional eating behaviors, prediabetes, and obesity, has been on a regimen of Wellbutrin SR 150 mg BID, prescription vitamin D 50,000 IU weekly, Metformin 500 mg BID, Zepbound 5 mg weekly, and Topiramate 25 mg daily. Over the past two months, the patient has made significant progress, losing twelve pounds by adhering to a category two eating plan 70% of the time and engaging in chair yoga for twenty minutes, six times per week.  The patient reports a change in eating habits, with a decreased desire for large meals and unhealthy foods. She attributes this change to the medication Zepbound. However, she has been struggling to meet her protein intake and has been supplementing with protein drinks.  The patient has also been dealing with back pain, which she manages with a heating pad and chair yoga, as recommended by her physical therapist. The patient reports that her core strength has improved, reducing her previous issues with falling.  The patient also mentions that she is due for a heart CT scan and had recent blood work done in preparation for this. She also notes that she has been off her vitamin D supplement for a few months. The patient is also on levothyroxine, which she was concerned about refilling due to potential thyroid  over-replacement causing heart attacks.  The patient's medications are being sent to [facility] and [facility]. The patient is due for a follow-up visit in two months.          PHYSICAL EXAM:  Blood pressure 101/65, pulse 60, temperature 98 F (36.7 C), height 5\' 1"  (1.549 m), weight 147 lb (66.7 kg), last menstrual period 12/27/2002, SpO2 95%. Body mass index is 27.78 kg/m.  DIAGNOSTIC DATA REVIEWED:  BMET    Component Value Date/Time   NA 141 03/14/2023 1410   K 4.7 03/14/2023 1410   CL 104 03/14/2023 1410   CO2 19 (L) 03/14/2023 1410   GLUCOSE 87 03/14/2023 1410   GLUCOSE 116 (H) 06/01/2021 0316   BUN 17 03/14/2023 1410   CREATININE 0.92 03/14/2023 1410   CREATININE 0.82 08/18/2012 0933   CALCIUM 10.1 03/14/2023 1410   GFRNONAA >60 06/01/2021 0316   GFRNONAA 78 08/18/2012 0933   GFRAA 76 03/14/2020 0000   GFRAA >89 08/18/2012 0933   Lab Results  Component Value Date   HGBA1C 5.3 12/31/2022   HGBA1C 5.6 04/08/2019   Lab Results  Component Value Date   INSULIN 5.7 12/31/2022   INSULIN 12.1 04/08/2019   Lab Results  Component Value Date   TSH 2.680 05/22/2022   CBC    Component Value Date/Time   WBC 7.3 09/06/2021 1512   WBC 8.2 06/02/2021 0314   RBC 4.46 09/06/2021 1512   RBC 3.50 (L) 06/02/2021 0314   HGB 13.8 09/06/2021 1512   HCT  41.4 09/06/2021 1512   PLT 267 09/06/2021 1512   MCV 93 09/06/2021 1512   MCH 30.9 09/06/2021 1512   MCH 30.9 06/02/2021 0314   MCHC 33.3 09/06/2021 1512   MCHC 31.2 06/02/2021 0314   RDW 12.7 09/06/2021 1512   Iron Studies No results found for: "IRON", "TIBC", "FERRITIN", "IRONPCTSAT" Lipid Panel     Component Value Date/Time   CHOL 125 12/31/2022 0820   TRIG 108 12/31/2022 0820   HDL 40 12/31/2022 0820   LDLCALC 65 12/31/2022 0820   Hepatic Function Panel     Component Value Date/Time   PROT 6.8 12/31/2022 0820   ALBUMIN 4.3 12/31/2022 0820   AST 27 12/31/2022 0820   ALT 21 12/31/2022 0820   ALKPHOS 106  12/31/2022 0820   BILITOT 0.4 12/31/2022 0820      Component Value Date/Time   TSH 2.680 05/22/2022 1058   Nutritional Lab Results  Component Value Date   VD25OH 109.0 (H) 12/31/2022   VD25OH 55.9 05/22/2022   VD25OH 39.1 12/11/2021     Assessment and Plan    Obesity Managed with Zepbound 5 mg weekly. Patient has lost 12 pounds in the last two months and reports improved portion control and reduced urge to overeat. Zepbound is effective in reducing appetite and aiding weight loss. - Continue Zepbound 5 mg weekly - Encourage continued weight loss efforts and exercise  Emotional Eating Behaviors Managed with Wellbutrin SR 150 mg BID and topiramate 25 mg daily. Patient reports improved control over eating behaviors. - Continue Wellbutrin SR 150 mg BID - Continue topiramate 25 mg daily  Prediabetes Managed with metformin 500 mg BID. Patient is following a category two eating plan 70% of the time and has lost 12 pounds in the last two months. - Continue metformin 500 mg BID - Encourage adherence to eating plan  Hypothyroidism Managed with levothyroxine. Recent weight loss may affect thyroid levels. Discussed the importance of monitoring TSH to avoid over-replacement, which can lead to cardiac complications. - Order TSH level - Adjust levothyroxine based on TSH results  Vitamin D Deficiency Chronic vitamin D deficiency managed with prescription vitamin D 50,000 IU weekly. Patient has not been on vitamin D for a few months. - Order vitamin D level - Restart vitamin D 50,000 IU weekly  General Health Maintenance Patient is engaging in chair yoga for 20 minutes six times per week as recommended by physical therapist to strengthen core muscles and prevent falls. Patient reports no recent falls and improved core strength. - Encourage continued chair yoga - Consider more core challenging exercises when ready  Follow-up - Schedule follow-up appointment in 8 weeks - Send Zepbound  prescription to [facility] - Send other prescriptions to [facility].       She was informed of the importance of frequent follow up visits to maximize her success with intensive lifestyle modifications for her multiple health conditions.    Quillian Quince, MD

## 2023-03-19 LAB — TSH: TSH: 1.48 u[IU]/mL (ref 0.450–4.500)

## 2023-03-19 LAB — VITAMIN D 25 HYDROXY (VIT D DEFICIENCY, FRACTURES): Vit D, 25-Hydroxy: 66 ng/mL (ref 30.0–100.0)

## 2023-03-31 ENCOUNTER — Telehealth (HOSPITAL_COMMUNITY): Payer: Self-pay | Admitting: *Deleted

## 2023-03-31 NOTE — Telephone Encounter (Signed)
Reaching out to patient to offer assistance regarding upcoming cardiac imaging study; pt verbalizes understanding of appt date/time, parking situation and where to check in, pre-test NPO status and medications ordered, and verified current allergies; name and call back number provided for further questions should they arise Hayley Sharpe RN Navigator Cardiac Imaging [facility] [phone] office [phone] cell  

## 2023-04-01 ENCOUNTER — Ambulatory Visit (HOSPITAL_COMMUNITY)
Admission: RE | Admit: 2023-04-01 | Discharge: 2023-04-01 | Disposition: A | Discharge status: Home / Self Care | Payer: Medicare HMO | Source: Ambulatory Visit | Attending: Cardiovascular Disease | Admitting: Cardiovascular Disease

## 2023-04-01 ENCOUNTER — Other Ambulatory Visit (HOSPITAL_COMMUNITY): Payer: Self-pay | Admitting: *Deleted

## 2023-04-01 ENCOUNTER — Other Ambulatory Visit (HOSPITAL_COMMUNITY): Payer: Medicare HMO

## 2023-04-01 DIAGNOSIS — E669 Obesity, unspecified: Secondary | ICD-10-CM | POA: Insufficient documentation

## 2023-04-01 DIAGNOSIS — R0609 Other forms of dyspnea: Secondary | ICD-10-CM | POA: Diagnosis present

## 2023-04-01 DIAGNOSIS — I700 Atherosclerosis of aorta: Secondary | ICD-10-CM | POA: Insufficient documentation

## 2023-04-01 MED ORDER — IOHEXOL 350 MG/ML SOLN
100.0000 mL | Freq: Once | INTRAVENOUS | Status: AC | PRN
  Administered 2023-04-01: 100 mL via INTRAVENOUS

## 2023-04-01 MED ORDER — METOPROLOL TARTRATE 5 MG/5ML IV SOLN: 10.0000 mg | Freq: Once | INTRAVENOUS | Status: DC | PRN

## 2023-04-01 MED ORDER — DILTIAZEM HCL 25 MG/5ML IV SOLN: 10.0000 mg | INTRAVENOUS | Status: DC | PRN

## 2023-04-01 MED ORDER — METOPROLOL TARTRATE 100 MG PO TABS: ORAL_TABLET | ORAL | 0 refills | Status: DC

## 2023-04-01 MED ORDER — NITROGLYCERIN 0.4 MG SL SUBL
0.8000 mg | SUBLINGUAL_TABLET | Freq: Once | SUBLINGUAL | Status: AC
  Administered 2023-04-01: 0.8 mg via SUBLINGUAL

## 2023-04-01 MED ORDER — METOPROLOL TARTRATE 5 MG/5ML IV SOLN
INTRAVENOUS | Status: AC
  Filled 2023-04-01: qty 15

## 2023-04-01 MED ORDER — NITROGLYCERIN 0.4 MG SL SUBL
SUBLINGUAL_TABLET | SUBLINGUAL | Status: AC
Start: 2023-04-01 — End: ?
  Filled 2023-04-01: qty 2

## 2023-04-01 MED ORDER — SODIUM CHLORIDE 0.9 % IV BOLUS
500.0000 mL | Freq: Once | INTRAVENOUS | Status: AC
  Administered 2023-04-01: 500 mL via INTRAVENOUS

## 2023-04-01 MED ORDER — SODIUM CHLORIDE 0.9 % IV BOLUS: 250.0000 mL | Freq: Once | INTRAVENOUS | Status: DC

## 2023-04-01 NOTE — Progress Notes (Signed)
 Post cardiac CT BP 81/56. Pt not symptomatic. MD Raford called and a total of 500mL NS bolus ordered. Pt states that her usual systolic is in the 90s. MD Raford ok with d/cing with systolic 9 points of her baseline. Final BP after NS bolus 90/54, pt not symptomatic.

## 2023-04-03 DIAGNOSIS — M546 Pain in thoracic spine: Secondary | ICD-10-CM | POA: Diagnosis not present

## 2023-04-21 ENCOUNTER — Encounter (HOSPITAL_BASED_OUTPATIENT_CLINIC_OR_DEPARTMENT_OTHER): Payer: Self-pay | Admitting: Cardiovascular Disease

## 2023-04-24 ENCOUNTER — Other Ambulatory Visit (INDEPENDENT_AMBULATORY_CARE_PROVIDER_SITE_OTHER): Payer: Self-pay | Admitting: Family Medicine

## 2023-04-24 DIAGNOSIS — E669 Obesity, unspecified: Secondary | ICD-10-CM

## 2023-05-15 ENCOUNTER — Ambulatory Visit (INDEPENDENT_AMBULATORY_CARE_PROVIDER_SITE_OTHER): Payer: Medicare HMO | Admitting: Family Medicine

## 2023-05-15 ENCOUNTER — Encounter (INDEPENDENT_AMBULATORY_CARE_PROVIDER_SITE_OTHER): Payer: Self-pay | Admitting: Family Medicine

## 2023-05-15 VITALS — BP 107/69 | HR 60 | Temp 97.8°F | Ht 61.0 in | Wt 139.0 lb

## 2023-05-15 DIAGNOSIS — F5089 Other specified eating disorder: Secondary | ICD-10-CM

## 2023-05-15 DIAGNOSIS — E669 Obesity, unspecified: Secondary | ICD-10-CM

## 2023-05-15 DIAGNOSIS — R7303 Prediabetes: Secondary | ICD-10-CM | POA: Diagnosis not present

## 2023-05-15 DIAGNOSIS — Z6826 Body mass index (BMI) 26.0-26.9, adult: Secondary | ICD-10-CM | POA: Insufficient documentation

## 2023-05-15 DIAGNOSIS — E559 Vitamin D deficiency, unspecified: Secondary | ICD-10-CM

## 2023-05-15 DIAGNOSIS — F3289 Other specified depressive episodes: Secondary | ICD-10-CM

## 2023-05-15 MED ORDER — BUPROPION HCL ER (SR) 150 MG PO TB12: 150.0000 mg | ORAL_TABLET | Freq: Two times a day (BID) | ORAL | 1 refills | Status: DC

## 2023-05-15 MED ORDER — VITAMIN D (ERGOCALCIFEROL) 1.25 MG (50000 UNIT) PO CAPS: ORAL_CAPSULE | ORAL | 1 refills | Status: DC

## 2023-05-15 MED ORDER — ZEPBOUND 5 MG/0.5ML ~~LOC~~ SOLN: 5.0000 mg | SUBCUTANEOUS | 1 refills | Status: DC

## 2023-05-15 MED ORDER — TOPIRAMATE 25 MG PO TABS: 25.0000 mg | ORAL_TABLET | Freq: Every day | ORAL | 1 refills | Status: DC

## 2023-05-15 MED ORDER — METFORMIN HCL 500 MG PO TABS: 500.0000 mg | ORAL_TABLET | Freq: Two times a day (BID) | ORAL | 1 refills | Status: DC

## 2023-05-15 NOTE — Progress Notes (Deleted)
.smr  Office: [phone]  /  Fax: [phone]  WEIGHT SUMMARY AND BIOMETRICS  Anthropometric Measurements Height: 5\' 1"  (1.549 m) Weight: 139 lb (63 kg) BMI (Calculated): 26.28 Weight at Last Visit: 147 lb Weight Lost Since Last Visit: 8 lb Weight Gained Since Last Visit: 0 Starting Weight: 216 lb Total Weight Loss (lbs): 77 lb (34.9 kg)   Body Composition  Body Fat %: 39.4 % Fat Mass (lbs): 55 lbs Muscle Mass (lbs): 80 lbs Total Body Water (lbs): 57.8 lbs Visceral Fat Rating : 10   Other Clinical Data Fasting: No Labs: No Today's Visit #: 50 Starting Date: 04/08/19    Chief Complaint: OBESITY   Discussed the use of AI scribe software for clinical note transcription with the patient, who gave verbal consent to proceed.  History of Present Illness              PHYSICAL EXAM:  Blood pressure 107/69, pulse 60, temperature 97.8 F (36.6 C), height 5\' 1"  (1.549 m), weight 139 lb (63 kg), last menstrual period 12/27/2002, SpO2 98%. Body mass index is 26.26 kg/m.  DIAGNOSTIC DATA REVIEWED:  BMET    Component Value Date/Time   NA 141 03/14/2023 1410   K 4.7 03/14/2023 1410   CL 104 03/14/2023 1410   CO2 19 (L) 03/14/2023 1410   GLUCOSE 87 03/14/2023 1410   GLUCOSE 116 (H) 06/01/2021 0316   BUN 17 03/14/2023 1410   CREATININE 0.92 03/14/2023 1410   CREATININE 0.82 08/18/2012 0933   CALCIUM 10.1 03/14/2023 1410   GFRNONAA >60 06/01/2021 0316   GFRNONAA 78 08/18/2012 0933   GFRAA 76 03/14/2020 0000   GFRAA >89 08/18/2012 0933   Lab Results  Component Value Date   HGBA1C 5.3 12/31/2022   HGBA1C 5.6 04/08/2019   Lab Results  Component Value Date   INSULIN 5.7 12/31/2022   INSULIN 12.1 04/08/2019   Lab Results  Component Value Date   TSH 1.480 03/18/2023   CBC    Component Value Date/Time   WBC 7.3 09/06/2021 1512   WBC 8.2 06/02/2021 0314   RBC 4.46 09/06/2021 1512   RBC 3.50 (L) 06/02/2021 0314   HGB 13.8 09/06/2021 1512   HCT 41.4  09/06/2021 1512   PLT 267 09/06/2021 1512   MCV 93 09/06/2021 1512   MCH 30.9 09/06/2021 1512   MCH 30.9 06/02/2021 0314   MCHC 33.3 09/06/2021 1512   MCHC 31.2 06/02/2021 0314   RDW 12.7 09/06/2021 1512   Iron Studies No results found for: "IRON", "TIBC", "FERRITIN", "IRONPCTSAT" Lipid Panel     Component Value Date/Time   CHOL 125 12/31/2022 0820   TRIG 108 12/31/2022 0820   HDL 40 12/31/2022 0820   LDLCALC 65 12/31/2022 0820   Hepatic Function Panel     Component Value Date/Time   PROT 6.8 12/31/2022 0820   ALBUMIN 4.3 12/31/2022 0820   AST 27 12/31/2022 0820   ALT 21 12/31/2022 0820   ALKPHOS 106 12/31/2022 0820   BILITOT 0.4 12/31/2022 0820      Component Value Date/Time   TSH 1.480 03/18/2023 1136   Nutritional Lab Results  Component Value Date   VD25OH 66.0 03/18/2023   VD25OH 109.0 (H) 12/31/2022   VD25OH 55.9 05/22/2022     Assessment and Plan              I have personally spent *** minutes total time today in preparation, patient care, and documentation for this visit, including the following: review of  clinical lab tests; review of medical tests/procedures/services.    She was informed of the importance of frequent follow up visits to maximize her success with intensive lifestyle modifications for her multiple health conditions.    Quillian Quince, MD

## 2023-05-15 NOTE — Progress Notes (Signed)
.[facility]  Office: [phone]  /  Fax: [phone]  WEIGHT SUMMARY AND BIOMETRICS  Anthropometric Measurements Height: 5\' 1"  (1.549 m) Weight: 139 lb (63 kg) BMI (Calculated): 26.28 Weight at Last Visit: 147 lb Weight Lost Since Last Visit: 8 lb Weight Gained Since Last Visit: 0 Starting Weight: 216 lb Total Weight Loss (lbs): 77 lb (34.9 kg)   Body Composition  Body Fat %: 39.4 % Fat Mass (lbs): 55 lbs Muscle Mass (lbs): 80 lbs Total Body Water (lbs): 57.8 lbs Visceral Fat Rating : 10   Other Clinical Data Fasting: No Labs: No Today's Visit #: 50 Starting Date: 04/08/19    Chief Complaint: OBESITY    History of Present Illness   Melissa Leach "Arline Asp" is a 73 year old female who presents for a follow-up on her weight loss and medication management.  She has successfully lost eight pounds in the last two months by adhering to her category two eating plan approximately 75% of the time. She engages in regular exercise, including 30 minutes of strengthening exercises and walking six days per week. She attends the gym and performs exercises to strengthen her legs and shoulders, which she finds beneficial.  She is currently on metformin and Zepbound to manage her prediabetes and obesity. She requests a refill for these medications and continues to follow her eating plan to help control her prediabetes.  She has a history of vitamin D deficiency and is on prescription vitamin D. Her most recent vitamin D level was 66, indicating she is now at goal after previously being over-replaced. She prefers to continue with the prescription vitamin D regimen.  She also has a history of emotional eating behaviors and is on Wellbutrin and Topamax, for which she requests refills. She feels 'pretty good' and is pleased with her progress, particularly noting improvements in her leg strength.          PHYSICAL EXAM:  Blood pressure 107/69, pulse 60, temperature 97.8 F (36.6 C), height  5\' 1"  (1.549 m), weight 139 lb (63 kg), last menstrual period 12/27/2002, SpO2 98%. Body mass index is 26.26 kg/m.  DIAGNOSTIC DATA REVIEWED:  BMET    Component Value Date/Time   NA 141 03/14/2023 1410   K 4.7 03/14/2023 1410   CL 104 03/14/2023 1410   CO2 19 (L) 03/14/2023 1410   GLUCOSE 87 03/14/2023 1410   GLUCOSE 116 (H) 06/01/2021 0316   BUN 17 03/14/2023 1410   CREATININE 0.92 03/14/2023 1410   CREATININE 0.82 08/18/2012 0933   CALCIUM 10.1 03/14/2023 1410   GFRNONAA >60 06/01/2021 0316   GFRNONAA 78 08/18/2012 0933   GFRAA 76 03/14/2020 0000   GFRAA >89 08/18/2012 0933   Lab Results  Component Value Date   HGBA1C 5.3 12/31/2022   HGBA1C 5.6 04/08/2019   Lab Results  Component Value Date   INSULIN 5.7 12/31/2022   INSULIN 12.1 04/08/2019   Lab Results  Component Value Date   TSH 1.480 03/18/2023   CBC    Component Value Date/Time   WBC 7.3 09/06/2021 1512   WBC 8.2 06/02/2021 0314   RBC 4.46 09/06/2021 1512   RBC 3.50 (L) 06/02/2021 0314   HGB 13.8 09/06/2021 1512   HCT 41.4 09/06/2021 1512   PLT 267 09/06/2021 1512   MCV 93 09/06/2021 1512   MCH 30.9 09/06/2021 1512   MCH 30.9 06/02/2021 0314   MCHC 33.3 09/06/2021 1512   MCHC 31.2 06/02/2021 0314   RDW 12.7 09/06/2021 1512  Iron Studies No results found for: "IRON", "TIBC", "FERRITIN", "IRONPCTSAT" Lipid Panel     Component Value Date/Time   CHOL 125 12/31/2022 0820   TRIG 108 12/31/2022 0820   HDL 40 12/31/2022 0820   LDLCALC 65 12/31/2022 0820   Hepatic Function Panel     Component Value Date/Time   PROT 6.8 12/31/2022 0820   ALBUMIN 4.3 12/31/2022 0820   AST 27 12/31/2022 0820   ALT 21 12/31/2022 0820   ALKPHOS 106 12/31/2022 0820   BILITOT 0.4 12/31/2022 0820      Component Value Date/Time   TSH 1.480 03/18/2023 1136   Nutritional Lab Results  Component Value Date   VD25OH 66.0 03/18/2023   VD25OH 109.0 (H) 12/31/2022   VD25OH 55.9 05/22/2022     Assessment and  Plan    Obesity Obesity with successful weight loss; BMI now 26. Follows category two eating plan 75% of the time and exercises 30 minutes six days per week. Fat percentage decreased from 40.5% to 39.4%. Visceral fat at goal (10). Discussed obesity paradox; BMI of 27 associated with optimal lifespan for those over 65. Goal: maintain weight, increase muscle mass through protein intake and strengthening exercises. - Continue category two eating plan - Continue exercising 30 minutes six days per week - Refill Zepbound - Consider extending Zepbound dosing to every ten days  Prediabetes Prediabetes managed with metformin and Zepbound. Continuing category two eating plan for control. - Refill metformin - Continue category two eating plan  Emotional Eating Behaviors Emotional eating behaviors managed with Wellbutrin and Topamax. - Refill Wellbutrin - Refill Topamax  Vitamin D Deficiency Vitamin D deficiency managed with prescription vitamin D. Current level 66, within goal range. - Refill prescription vitamin D, to be taken twice a month  General Health Maintenance Maintaining weight and increasing muscle mass. Taking multivitamin and calcium plus D supplement. Adjusting to new hearing aids. - Continue multivitamin - Continue calcium plus D supplement - Follow up on hearing aid adjustment  Follow-up - Follow up in eight weeks.         I have personally spent 40 minutes total time today in preparation, patient care, and documentation for this visit, including the following: review of clinical lab tests; review of medical tests/procedures/services.    She was informed of the importance of frequent follow up visits to maximize her success with intensive lifestyle modifications for her multiple health conditions.    Quillian Quince, MD

## 2023-05-20 DIAGNOSIS — M25569 Pain in unspecified knee: Secondary | ICD-10-CM | POA: Diagnosis not present

## 2023-05-26 DIAGNOSIS — H35372 Puckering of macula, left eye: Secondary | ICD-10-CM | POA: Diagnosis not present

## 2023-05-29 ENCOUNTER — Encounter (INDEPENDENT_AMBULATORY_CARE_PROVIDER_SITE_OTHER): Payer: Self-pay | Admitting: Family Medicine

## 2023-06-05 ENCOUNTER — Ambulatory Visit: Payer: Medicare HMO | Admitting: Neurology

## 2023-06-05 DIAGNOSIS — G43709 Chronic migraine without aura, not intractable, without status migrainosus: Secondary | ICD-10-CM

## 2023-06-05 MED ORDER — ONABOTULINUMTOXINA 200 UNITS IJ SOLR
155.0000 [IU] | Freq: Once | INTRAMUSCULAR | Status: AC
  Administered 2023-06-05: 155 [IU] via INTRAMUSCULAR

## 2023-06-05 NOTE — Progress Notes (Signed)
 Botox- 200 units x 1 vial Lot: [id number] Expiration: 05/2025 NDC: 0981-1914-78  Bacteriostatic 0.9% Sodium Chloride- 4 mL  Lot: [id number] Expiration: 01/31/2024 NDC: 3086-5784-69  Dx: G29.528 B/B Witnessed by Truitt Leep RN

## 2023-06-05 NOTE — Progress Notes (Signed)
 Consent Form Botulism Toxin Injection For Chronic Migraine  06/05/2023: Stable. Patient feels that her migraines cause her jaw to ache in her jaw aching also can cause her migraines to worsen it is a trigger for migraines included 10 units in each masseter to see if that helps with migraine severity.  03/13/2023: stable, doing great. Patient feels that her migraines cause her jaw to ache in her jaw aching also can cause her migraines to worsen it is a trigger for migraines included 10 units in each masseter to see if that helps with migraine severity.  12/17/2022: doing great, stable 09/18/2022: stable 03/20/2022: stable  12/19/2021: stable 09/26/2021: stable. : Doing great, >>70% decrease migraines.   Reviewed orally with patient, additionally signature is on file:  Botulism toxin has been approved by the Federal drug administration for treatment of chronic migraine. Botulism toxin does not cure chronic migraine and it may not be effective in some patients.  The administration of botulism toxin is accomplished by injecting a small amount of toxin into the muscles of the neck and head. Dosage must be titrated for each individual. Any benefits resulting from botulism toxin tend to wear off after 3 months with a repeat injection required if benefit is to be maintained. Injections are usually done every 3-4 months with maximum effect peak achieved by about 2 or 3 weeks. Botulism toxin is expensive and you should be sure of what costs you will incur resulting from the injection.  The side effects of botulism toxin use for chronic migraine may include:   -Transient, and usually mild, facial weakness with facial injections  -Transient, and usually mild, head or neck weakness with head/neck injections  -Reduction or loss of forehead facial animation due to forehead muscle weakness  -Eyelid drooping  -Dry eye  -Pain at the site of injection or bruising at the site of injection  -Double  vision  -Potential unknown long term risks  Contraindications: You should not have Botox if you are pregnant, nursing, allergic to albumin, have an infection, skin condition, or muscle weakness at the site of the injection, or have myasthenia gravis, Lambert-Eaton syndrome, or ALS.  It is also possible that as with any injection, there may be an allergic reaction or no effect from the medication. Reduced effectiveness after repeated injections is sometimes seen and rarely infection at the injection site may occur. All care will be taken to prevent these side effects. If therapy is given over a long time, atrophy and wasting in the muscle injected may occur. Occasionally the patient's become refractory to treatment because they develop antibodies to the toxin. In this event, therapy needs to be modified.  I have read the above information and consent to the administration of botulism toxin.    BOTOX PROCEDURE NOTE FOR MIGRAINE HEADACHE    Contraindications and precautions discussed with patient(above). Aseptic procedure was observed and patient tolerated procedure. Procedure performed by Dr. Artemio Aly  The condition has existed for more than 6 months, and pt does not have a diagnosis of ALS, Myasthenia Gravis or Lambert-Eaton Syndrome.  Risks and benefits of injections discussed and pt agrees to proceed with the procedure.  Written consent obtained  These injections are medically necessary. Pt  receives good benefits from these injections. These injections do not cause sedations or hallucinations which the oral therapies may cause.  Description of procedure:  The patient was placed in a sitting position. The standard protocol was used for Botox as follows, with 5 units of  Botox injected at each site:   -Procerus muscle, midline injection  -Corrugator muscle, bilateral injection  -Frontalis muscle, bilateral injection, with 2 sites each side, medial injection was performed in the upper  one third of the frontalis muscle, in the region vertical from the medial inferior edge of the superior orbital rim. The lateral injection was again in the upper one third of the forehead vertically above the lateral limbus of the cornea, 1.5 cm lateral to the medial injection site.  -Temporalis muscle injection, 4 sites, bilaterally. The first injection was 3 cm above the tragus of the ear, second injection site was 1.5 cm to 3 cm up from the first injection site in line with the tragus of the ear. The third injection site was 1.5-3 cm forward between the first 2 injection sites. The fourth injection site was 1.5 cm posterior to the second injection site.   -Occipitalis muscle injection, 3 sites, bilaterally. The first injection was done one half way between the occipital protuberance and the tip of the mastoid process behind the ear. The second injection site was done lateral and superior to the first, 1 fingerbreadth from the first injection. The third injection site was 1 fingerbreadth superiorly and medially from the first injection site.  -Cervical paraspinal muscle injection, 2 sites, bilateral knee first injection site was 1 cm from the midline of the cervical spine, 3 cm inferior to the lower border of the occipital protuberance. The second injection site was 1.5 cm superiorly and laterally to the first injection site.  -Trapezius muscle injection was performed at 3 sites, bilaterally. The first injection site was in the upper trapezius muscle halfway between the inflection point of the neck, and the acromion. The second injection site was one half way between the acromion and the first injection site. The third injection was done between the first injection site and the inflection point of the neck.   Will return for repeat injection in 3 months.   155 units of Botox was used, 45u Botox not injected was wasted. The patient tolerated the procedure well, there were no complications of the above  procedure.

## 2023-06-20 ENCOUNTER — Other Ambulatory Visit (INDEPENDENT_AMBULATORY_CARE_PROVIDER_SITE_OTHER): Payer: Self-pay | Admitting: Family Medicine

## 2023-06-20 DIAGNOSIS — E669 Obesity, unspecified: Secondary | ICD-10-CM

## 2023-06-30 ENCOUNTER — Encounter (INDEPENDENT_AMBULATORY_CARE_PROVIDER_SITE_OTHER): Payer: Self-pay | Admitting: Family Medicine

## 2023-06-30 ENCOUNTER — Ambulatory Visit (INDEPENDENT_AMBULATORY_CARE_PROVIDER_SITE_OTHER): Payer: Medicare HMO | Admitting: Family Medicine

## 2023-06-30 ENCOUNTER — Other Ambulatory Visit (INDEPENDENT_AMBULATORY_CARE_PROVIDER_SITE_OTHER): Payer: Self-pay | Admitting: Family Medicine

## 2023-06-30 VITALS — BP 95/63 | HR 93 | Temp 97.8°F | Ht 61.0 in | Wt 135.0 lb

## 2023-06-30 DIAGNOSIS — R7303 Prediabetes: Secondary | ICD-10-CM | POA: Diagnosis not present

## 2023-06-30 DIAGNOSIS — E559 Vitamin D deficiency, unspecified: Secondary | ICD-10-CM | POA: Diagnosis not present

## 2023-06-30 DIAGNOSIS — F5089 Other specified eating disorder: Secondary | ICD-10-CM

## 2023-06-30 DIAGNOSIS — E669 Obesity, unspecified: Secondary | ICD-10-CM | POA: Diagnosis not present

## 2023-06-30 DIAGNOSIS — F3289 Other specified depressive episodes: Secondary | ICD-10-CM

## 2023-06-30 DIAGNOSIS — M6284 Sarcopenia: Secondary | ICD-10-CM | POA: Insufficient documentation

## 2023-06-30 DIAGNOSIS — Z6825 Body mass index (BMI) 25.0-25.9, adult: Secondary | ICD-10-CM

## 2023-06-30 MED ORDER — ZEPBOUND 5 MG/0.5ML ~~LOC~~ SOLN: 5.0000 mg | SUBCUTANEOUS | 1 refills | Status: DC

## 2023-06-30 MED ORDER — VITAMIN D (ERGOCALCIFEROL) 1.25 MG (50000 UNIT) PO CAPS: ORAL_CAPSULE | ORAL | 1 refills | Status: DC

## 2023-06-30 MED ORDER — BUPROPION HCL ER (SR) 150 MG PO TB12: 150.0000 mg | ORAL_TABLET | Freq: Two times a day (BID) | ORAL | 1 refills | Status: DC

## 2023-06-30 MED ORDER — METFORMIN HCL 500 MG PO TABS: 500.0000 mg | ORAL_TABLET | Freq: Every day | ORAL | 1 refills | Status: DC

## 2023-06-30 MED ORDER — TOPIRAMATE 25 MG PO TABS
25.0000 mg | ORAL_TABLET | Freq: Every day | ORAL | 1 refills | Status: DC
Start: 2023-06-30 — End: 2023-09-01

## 2023-06-30 NOTE — Progress Notes (Signed)
 Office: [phone]  /  Fax: [phone]  WEIGHT SUMMARY AND BIOMETRICS  Anthropometric Measurements Height: 5\' 1"  (1.549 m) Weight: 135 lb (61.2 kg) BMI (Calculated): 25.52 Weight at Last Visit: 139 lb Weight Lost Since Last Visit: 4 lb Weight Gained Since Last Visit: 0 Starting Weight: 216 lb Total Weight Loss (lbs): 81 lb (36.7 kg)   Body Composition  Body Fat %: 38.4 % Fat Mass (lbs): 52 lbs Muscle Mass (lbs): 79 lbs Total Body Water (lbs): 58.6 lbs Visceral Fat Rating : 10   Other Clinical Data Fasting: No Labs: No Today's Visit #: 67 Starting Date: 04/08/19    Chief Complaint: OBESITY   Discussed the use of AI scribe software for clinical note transcription with the patient, who gave verbal consent to proceed.  History of Present Illness   Melissa Leach "Arline Asp" is a 73 year old female who presents for obesity treatment plan assessment and progress evaluation.  She follows her category two eating plan approximately 70% of the time and engages in walking for exercise for 20 minutes almost every day. She is currently taking Zepbound 5 mg for obesity and requests a refill for all her medications. She has not lost as much weight recently and wants to reach a weight in the 120s.  She has a history of prediabetes and is treated with metformin 500 mg twice a day. Her most recent hemoglobin A1c, done about six months ago, was 5.3. She mentions that her A1c was 5.0 when she gave blood recently.  She has a history of vitamin D deficiency and is treated with prescription vitamin D. Her most recent labs from December show her vitamin D level was at goal at 91.  She has a history of emotional eating behaviors and is treated with Wellbutrin and topiramate. She experiences stress due to family issues, including her daughter's marital problems and her granddaughter's mental health challenges.  She reports hair loss and has started using a shampoo with caffeine and  vitamins to stimulate hair growth. She is not getting a minimum of 75 grams of protein per day but is trying to improve her protein intake by adding extra meat to her chili and considering protein drinks. Despite this, she continues to lose muscle mass and is developing sarcopenia.          PHYSICAL EXAM:  Blood pressure 95/63, pulse 93, temperature 97.8 F (36.6 C), height 5\' 1"  (1.549 m), weight 135 lb (61.2 kg), last menstrual period 12/27/2002, SpO2 98%. Body mass index is 25.51 kg/m.  DIAGNOSTIC DATA REVIEWED:  BMET    Component Value Date/Time   NA 141 03/14/2023 1410   K 4.7 03/14/2023 1410   CL 104 03/14/2023 1410   CO2 19 (L) 03/14/2023 1410   GLUCOSE 87 03/14/2023 1410   GLUCOSE 116 (H) 06/01/2021 0316   BUN 17 03/14/2023 1410   CREATININE 0.92 03/14/2023 1410   CREATININE 0.82 08/18/2012 0933   CALCIUM 10.1 03/14/2023 1410   GFRNONAA >60 06/01/2021 0316   GFRNONAA 78 08/18/2012 0933   GFRAA 76 03/14/2020 0000   GFRAA >89 08/18/2012 0933   Lab Results  Component Value Date   HGBA1C 5.3 12/31/2022   HGBA1C 5.6 04/08/2019   Lab Results  Component Value Date   INSULIN 5.7 12/31/2022   INSULIN 12.1 04/08/2019   Lab Results  Component Value Date   TSH 1.480 03/18/2023   CBC    Component Value Date/Time   WBC 7.3 09/06/2021 1512  WBC 8.2 06/02/2021 0314   RBC 4.46 09/06/2021 1512   RBC 3.50 (L) 06/02/2021 0314   HGB 13.8 09/06/2021 1512   HCT 41.4 09/06/2021 1512   PLT 267 09/06/2021 1512   MCV 93 09/06/2021 1512   MCH 30.9 09/06/2021 1512   MCH 30.9 06/02/2021 0314   MCHC 33.3 09/06/2021 1512   MCHC 31.2 06/02/2021 0314   RDW 12.7 09/06/2021 1512   Iron Studies No results found for: "IRON", "TIBC", "FERRITIN", "IRONPCTSAT" Lipid Panel     Component Value Date/Time   CHOL 125 12/31/2022 0820   TRIG 108 12/31/2022 0820   HDL 40 12/31/2022 0820   LDLCALC 65 12/31/2022 0820   Hepatic Function Panel     Component Value Date/Time   PROT 6.8  12/31/2022 0820   ALBUMIN 4.3 12/31/2022 0820   AST 27 12/31/2022 0820   ALT 21 12/31/2022 0820   ALKPHOS 106 12/31/2022 0820   BILITOT 0.4 12/31/2022 0820      Component Value Date/Time   TSH 1.480 03/18/2023 1136   Nutritional Lab Results  Component Value Date   VD25OH 66.0 03/18/2023   VD25OH 109.0 (H) 12/31/2022   VD25OH 55.9 05/22/2022     Assessment and Plan    Obesity She is being treated with Zepbound 5 mg. She adheres to her category two eating plan approximately 70% of the time and engages in 20 minutes of walking almost daily. However, she is experiencing muscle mass loss, which is undesirable and may negatively impact her health. Strengthening exercises are essential to enhance metabolism and prevent osteoporosis. She aims to achieve a weight in the 120s and recognizes the need to incorporate gym workouts. Adequate protein intake is vital to support muscle mass and prevent hair loss. - Continue Zepbound 5 mg - Encourage strengthening exercises to prevent muscle loss and osteoporosis - Discuss the importance of protein intake to support muscle mass and prevent hair loss - Encourage consumption of at least 75 grams of protein per day - Refill all current medications  Emotional Eating Behaviors She is being treated with Wellbutrin and topiramate and is stable - Continue Wellbutrin and topiramate  Prediabetes She is being treated with metformin 500 mg twice daily. Her hemoglobin A1c from six months ago was 5.3, indicating well-controlled blood glucose levels. Due to this, reducing the metformin dosage to once daily was discussed. - Reduce metformin to 500 mg once daily - Monitor blood glucose levels -Continue diet/exercise  Vitamin D Deficiency She is being treated with prescription vitamin D. Her vitamin D level in December was at goal at 93. - Continue prescription vitamin D  Sarcopenia Emphasized maintaining a healthy lifestyle through diet and exercise.  Discussed the importance of protein intake and strengthening exercises to support overall health and prevent muscle loss. - Encourage a balanced diet with adequate protein intake - Encourage regular physical activity, including strengthening exercises - Discuss ways to build muscle with her PT at her next visit  Follow-up She is scheduled to travel to [place] next week and will return for a follow-up in two months. Emphasized maintaining her exercise and dietary regimen during this period. - Schedule follow-up appointment in two months - Encourage adherence to exercise and dietary regimen during travel       She was informed of the importance of frequent follow up visits to maximize her success with intensive lifestyle modifications for her multiple health conditions.    Quillian Quince, MD

## 2023-06-30 NOTE — Addendum Note (Signed)
 Addended by: Theola Sequin on: 06/30/2023 11:12 AM   Modules accepted: Orders

## 2023-07-16 DIAGNOSIS — M79672 Pain in left foot: Secondary | ICD-10-CM | POA: Diagnosis not present

## 2023-07-21 ENCOUNTER — Encounter: Payer: Self-pay | Admitting: Podiatry

## 2023-07-21 ENCOUNTER — Ambulatory Visit (INDEPENDENT_AMBULATORY_CARE_PROVIDER_SITE_OTHER)

## 2023-07-21 ENCOUNTER — Ambulatory Visit: Admitting: Podiatry

## 2023-07-21 DIAGNOSIS — S90852A Superficial foreign body, left foot, initial encounter: Secondary | ICD-10-CM

## 2023-07-21 DIAGNOSIS — M19071 Primary osteoarthritis, right ankle and foot: Secondary | ICD-10-CM | POA: Diagnosis not present

## 2023-07-21 DIAGNOSIS — M2011 Hallux valgus (acquired), right foot: Secondary | ICD-10-CM

## 2023-07-21 DIAGNOSIS — M19072 Primary osteoarthritis, left ankle and foot: Secondary | ICD-10-CM

## 2023-07-21 DIAGNOSIS — M21611 Bunion of right foot: Secondary | ICD-10-CM

## 2023-07-21 NOTE — Progress Notes (Signed)
 Subjective:  Patient ID: Melissa Leach, female    DOB: [birth year],  MRN: [id number]  Chief Complaint  Patient presents with   Foot Pain    Plantar heel left - tender, black spot on heel x few months, PCP eval- asked her to trim it off, but doc referred, doesn't remember stepping on anything  [place] midfoot bilateral - arthritis x years, bothers her sometimes, wanted it checked   New Patient (Initial Visit)    Discussed the use of AI scribe software for clinical note transcription with the patient, who gave verbal consent to proceed.  History of Present Illness Melissa Leach "Melissa Leach" is a 73 year old female with arthritis who presents with heel pain and foot issues. She was referred by her family doctor to see a podiatrist.  She has been experiencing soreness in her left heel for the past couple of months, with pain that varies in intensity. Initially, she thought it was due to plantar stress and consulted her physical therapist, who suggested there might be something embedded in the heel. She denies recalling stepping on anything that could have caused the heel pain.  She has a history of arthritis affecting multiple joints, including her knees, hips, shoulders, and back. She has undergone five knee replacements, two hip replacements, and two shoulder replacements. She experiences soreness and pain in her feet due to arthritis, which is sometimes alleviated by wearing supportive shoes. She takes Celebrex daily for arthritis pain management and occasionally uses Tylenol .  She has experienced plantar fasciitis in the past and has a heel spur on her right foot. She notes that losing weight has helped reduce the symptoms associated with her heel spur and arthritis. She has been attending [facility] for the past five years, which has contributed to her weight loss and overall health improvement.  She mentions a mild bunion deformity on her right foot, which causes  her big toe to drift slightly. She is aware of the importance of wearing shoes with adequate space to prevent discomfort.      Objective:    Physical Exam VASCULAR: DP and PT pulse palpable. Foot is warm and well-perfused. Capillary fill time is brisk. DERMATOLOGIC: Normal skin turgor, texture, and temperature. No open lesions, rashes, or ulcerations. Small hard skin lesion with central black core on left heel. NEUROLOGIC: Normal sensation to light touch and pressure. No paresthesias on examination. ORTHOPEDIC: Smooth pain-free range of motion of all examined joints. No ecchymosis or bruising. No gross deformity. No pain to palpation. Midfoot spurring bilaterally in the dorsal midfoot, nontender to palpation. Slight dorsal spur on left first MTP joint with small ganglion cyst. Moderate to severe degenerative changes of tarsometatarsal and naviculoconagiform joints bilaterally.   No images are attached to the encounter.    Results Procedure: Foreign body removal from foot Description: Skin prepared with alcohol.  Sharp debridement of overlying skin and hyperkeratosis was performed to the dermis.  No local anesthetic required. Thorn removed from heel. Area inspected to ensure complete removal. No residual foreign material observed.  May have hard area of hyperkeratosis here.  No open lesion or bandage required  RADIOLOGY Foot X-ray: No foreign body detected; significant arthritis involving most joints across the top of both feet; moderate to severe degenerative changes of the tarsometatarsal and naviculocuneiform joints bilaterally; heel spur on the right foot; calcification of the left plantar fascia.   Assessment:   1. Foreign body of left heel   2. Arthritis  of left midfoot   3. Arthritis of right midfoot   4. Hallux valgus with bunions, right      Plan:  Patient was evaluated and treated and all questions answered.  Assessment and Plan Assessment & Plan Foreign body in left  heel She presented with intermittent heel pain for a couple of months. Examination revealed a small hard skin lesion with a central black core on the left heel. A thorn was identified and removed, likely causing the discomfort. The lesion should resolve as the skin heals. She did not recall stepping on anything, and the x-ray did not show any foreign body, as only metallic objects would be visible. - Advise to return if symptoms persist or worsen  Osteoarthritis of the feet She has significant osteoarthritis affecting most joints across the top of both feet, with moderate to severe degenerative changes of the tarsometatarsal and naviculocuneiform joints bilaterally. The condition is not severely limiting her activities, and she manages pain with Celebrex and occasionally Tylenol . Discussed potential for cortisone injections and surgical options if the condition becomes more limiting. Surgery would involve joint fusion, requiring six to eight weeks of non-weight bearing recovery. - Continue pain management with Celebrex and Tylenol  as needed - Consider cortisone injections if pain becomes more severe - Discuss surgical options if the condition becomes significantly limiting  Bunion deformity She has a very mild bunion deformity causing the big toe to drift slightly, which may lead to rubbing of the toes. Currently, it is not causing significant discomfort. Advised on wearing shoes with adequate space to prevent rubbing and discussed the use of silicone toe spacers if discomfort arises. - Advise wearing shoes with adequate space to prevent rubbing - Consider silicone toe spacers if discomfort arises  Heel spur She has a heel spur on the right foot, which is currently asymptomatic. She has plantar fasciitis, and the heel spur is likely a result of previous episodes. Weight loss has helped alleviate symptoms. Heel spurs are often asymptomatic and do not require intervention unless they cause symptoms. -  No immediate intervention required as the heel spur is asymptomatic      Return if symptoms worsen or fail to improve.

## 2023-07-21 NOTE — Patient Instructions (Signed)
 VISIT SUMMARY:  Today, you were seen for heel pain and foot issues. We discussed your left heel pain, which has been ongoing for a couple of months, and your history of arthritis affecting multiple joints. We also reviewed your mild bunion deformity and heel spur on the right foot. A thorough examination was conducted, and a treatment plan was established for each of your concerns.  YOUR PLAN:  -FOREIGN BODY IN LEFT HEEL: You had a small thorn embedded in your left heel, which was causing the pain. The thorn was removed, and the lesion should heal on its own. If the pain persists or worsens, please return for further evaluation.  -OSTEOARTHRITIS OF THE FEET: Osteoarthritis is a condition where the cartilage in your joints wears down over time, causing pain and stiffness. You have significant osteoarthritis in your feet, but it is not severely limiting your activities. Continue managing the pain with Celebrex and Tylenol  as needed. If the pain becomes more severe, we can consider cortisone injections or discuss surgical options.  -BUNION DEFORMITY: A bunion deformity is a bony bump that forms on the joint at the base of your big toe. You have a mild bunion that is causing your big toe to drift slightly. To prevent discomfort, wear shoes with adequate space and consider using silicone toe spacers if needed.  -HEEL SPUR: A heel spur is a bony growth on the heel bone, often associated with plantar fasciitis. Your heel spur on the right foot is currently not causing any symptoms, so no immediate intervention is required.  INSTRUCTIONS:  Please return if the pain in your left heel persists or worsens. Continue managing your osteoarthritis pain with Celebrex and Tylenol  as needed. If your foot pain becomes more severe, we can consider cortisone injections or discuss surgical options. Wear shoes with adequate space to prevent discomfort from your bunion, and consider silicone toe spacers if needed.

## 2023-08-05 ENCOUNTER — Other Ambulatory Visit (INDEPENDENT_AMBULATORY_CARE_PROVIDER_SITE_OTHER): Payer: Self-pay | Admitting: Family Medicine

## 2023-08-05 DIAGNOSIS — E669 Obesity, unspecified: Secondary | ICD-10-CM

## 2023-08-06 DIAGNOSIS — M546 Pain in thoracic spine: Secondary | ICD-10-CM | POA: Diagnosis not present

## 2023-08-26 DIAGNOSIS — Z1231 Encounter for screening mammogram for malignant neoplasm of breast: Secondary | ICD-10-CM | POA: Diagnosis not present

## 2023-08-27 ENCOUNTER — Telehealth: Payer: Self-pay | Admitting: Adult Health

## 2023-08-27 NOTE — Telephone Encounter (Signed)
 Received fax of approval, pt will continue to be B/B. Auth#: [id number] (08/27/23-02/27/24)

## 2023-08-27 NOTE — Telephone Encounter (Signed)
 Completed [facility] PA form and faxed with OV notes to [phone].

## 2023-09-01 ENCOUNTER — Ambulatory Visit (INDEPENDENT_AMBULATORY_CARE_PROVIDER_SITE_OTHER): Admitting: Family Medicine

## 2023-09-01 ENCOUNTER — Encounter (INDEPENDENT_AMBULATORY_CARE_PROVIDER_SITE_OTHER): Payer: Self-pay | Admitting: Family Medicine

## 2023-09-01 VITALS — BP 113/70 | HR 67 | Temp 97.6°F | Ht 61.0 in | Wt 130.0 lb

## 2023-09-01 DIAGNOSIS — Z6824 Body mass index (BMI) 24.0-24.9, adult: Secondary | ICD-10-CM | POA: Diagnosis not present

## 2023-09-01 DIAGNOSIS — E785 Hyperlipidemia, unspecified: Secondary | ICD-10-CM | POA: Diagnosis not present

## 2023-09-01 DIAGNOSIS — E669 Obesity, unspecified: Secondary | ICD-10-CM | POA: Diagnosis not present

## 2023-09-01 DIAGNOSIS — E559 Vitamin D deficiency, unspecified: Secondary | ICD-10-CM | POA: Diagnosis not present

## 2023-09-01 DIAGNOSIS — F5089 Other specified eating disorder: Secondary | ICD-10-CM

## 2023-09-01 DIAGNOSIS — F3289 Other specified depressive episodes: Secondary | ICD-10-CM

## 2023-09-01 DIAGNOSIS — E038 Other specified hypothyroidism: Secondary | ICD-10-CM

## 2023-09-01 DIAGNOSIS — E7849 Other hyperlipidemia: Secondary | ICD-10-CM

## 2023-09-01 DIAGNOSIS — R7303 Prediabetes: Secondary | ICD-10-CM

## 2023-09-01 DIAGNOSIS — R632 Polyphagia: Secondary | ICD-10-CM | POA: Diagnosis not present

## 2023-09-01 DIAGNOSIS — E039 Hypothyroidism, unspecified: Secondary | ICD-10-CM

## 2023-09-01 MED ORDER — TOPIRAMATE 25 MG PO TABS
25.0000 mg | ORAL_TABLET | Freq: Every day | ORAL | 1 refills | Status: DC
Start: 2023-09-01 — End: 2023-10-27

## 2023-09-01 MED ORDER — VITAMIN D (ERGOCALCIFEROL) 1.25 MG (50000 UNIT) PO CAPS: ORAL_CAPSULE | ORAL | 1 refills | Status: DC

## 2023-09-01 MED ORDER — METFORMIN HCL 500 MG PO TABS: 500.0000 mg | ORAL_TABLET | Freq: Every day | ORAL | 1 refills | Status: DC

## 2023-09-01 MED ORDER — BUPROPION HCL ER (SR) 150 MG PO TB12: 150.0000 mg | ORAL_TABLET | Freq: Two times a day (BID) | ORAL | 1 refills | Status: DC

## 2023-09-01 MED ORDER — ZEPBOUND 5 MG/0.5ML ~~LOC~~ SOLN
5.0000 mg | SUBCUTANEOUS | 1 refills | Status: DC
Start: 2023-09-01 — End: 2023-10-27

## 2023-09-01 NOTE — Progress Notes (Signed)
 Office: [phone]  /  Fax: [phone]  WEIGHT SUMMARY AND BIOMETRICS  Anthropometric Measurements Height: 5\' 1"  (1.549 m) Weight: 130 lb (59 kg) BMI (Calculated): 24.58 Weight at Last Visit: 135 lb Weight Lost Since Last Visit: 5 lb Weight Gained Since Last Visit: 0 Starting Weight: 216 lb Total Weight Loss (lbs): 86 lb (39 kg) Peak Weight: 216 lb   Body Composition  Body Fat %: 35.7 % Fat Mass (lbs): 46.6 lbs Muscle Mass (lbs): 79.8 lbs Total Body Water (lbs): 56 lbs Visceral Fat Rating : 9   Other Clinical Data Fasting: no Labs: no Today's Visit #: 51 Starting Date: 04/08/19    Chief Complaint: OBESITY     History of Present Illness Melissa Leach "Melissa Leach" is a 73 year old female who presents for a follow-up on her obesity treatment and progress.  She has been following a category two eating plan, adhering to it about 60% of the time, and has incorporated walking into her routine, exercising for 30 minutes, six times per week. Since her last visit, she has lost five pounds. Her BMI is now 24.5, and her muscle mass has remained steady with a slight increase since her last visit.  She has experienced gastrointestinal discomfort, described as a 'sick stomach', for the last few days, which she attributes to consuming [employer] hot chicken, despite choosing the 'no spice' option. This has resulted in diarrhea for a few days.  Her current medications include metformin , vitamin D  every other week, and Lipitor for cholesterol management. She mentions the possibility of needing blood work soon, as it has been six months since her last tests. She is also on Zepbound , which she stretches out over longer periods, having two or three doses left.  She recalls a previous experience where her blood pressure was recorded as very low, at 78, but increased to 120 after walking around. She clarifies that she is not on metoprolol , which was previously listed in her records for a  heart test.  Socially, she is planning a trip to [facility] with her grandchildren, aged 72 and 35, in August. She enjoys walking her dog and participates in a women's group. 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      PHYSICAL EXAM:  Blood pressure 113/70, pulse 67, temperature 97.6 F (36.4 C), height 5\' 1"  (1.549 m), weight 130 lb (59 kg), last menstrual period 12/27/2002, SpO2 93%. Body mass index is 24.56 kg/m.  DIAGNOSTIC DATA REVIEWED:  BMET    Component Value Date/Time   NA 141 03/14/2023 1410   K 4.7 03/14/2023 1410   CL 104 03/14/2023 1410   CO2 19 (L) 03/14/2023 1410   GLUCOSE 87 03/14/2023 1410   GLUCOSE 116 (H) 06/01/2021 0316   BUN 17 03/14/2023 1410   CREATININE 0.92 03/14/2023 1410   CREATININE 0.82 08/18/2012 0933   CALCIUM  10.1 03/14/2023 1410   GFRNONAA >60 06/01/2021 0316   GFRNONAA 78 08/18/2012 0933   GFRAA 76 03/14/2020 0000   GFRAA >89 08/18/2012 0933   Lab Results  Component Value Date   HGBA1C 5.3 12/31/2022   HGBA1C 5.6 04/08/2019   Lab Results  Component Value Date   INSULIN  5.7 12/31/2022   INSULIN  12.1 04/08/2019   Lab Results  Component Value Date   TSH 1.480 03/18/2023   CBC    Component Value Date/Time   WBC 7.3 09/06/2021 1512   WBC 8.2 06/02/2021 0314   RBC 4.46 09/06/2021  1512   RBC 3.50 (L) 06/02/2021 0314   HGB 13.8 09/06/2021 1512   HCT 41.4 09/06/2021 1512   PLT 267 09/06/2021 1512   MCV 93 09/06/2021 1512   MCH 30.9 09/06/2021 1512   MCH 30.9 06/02/2021 0314   MCHC 33.3 09/06/2021 1512   MCHC 31.2 06/02/2021 0314   RDW 12.7 09/06/2021 1512   Iron Studies No results found for: "IRON", "TIBC", "FERRITIN", "IRONPCTSAT" Lipid Panel     Component Value Date/Time   CHOL 125 12/31/2022 0820   TRIG 108 12/31/2022 0820   HDL 40 12/31/2022 0820   LDLCALC 65 12/31/2022 0820   Hepatic Function Panel     Component Value Date/Time   PROT 6.8 12/31/2022 0820    ALBUMIN 4.3 12/31/2022 0820   AST 27 12/31/2022 0820   ALT 21 12/31/2022 0820   ALKPHOS 106 12/31/2022 0820   BILITOT 0.4 12/31/2022 0820      Component Value Date/Time   TSH 1.480 03/18/2023 1136   Nutritional Lab Results  Component Value Date   VD25OH 66.0 03/18/2023   VD25OH 109.0 (H) 12/31/2022   VD25OH 55.9 05/22/2022     Assessment and Plan Assessment & Plan Obesity and polyphagia Obesity is managed with lifestyle modifications, including a category two eating plan and regular exercise. She has lost five pounds since the last visit, improving BMI to 24.5. She exercises by walking six times per week and is extending Zepbound  use to every 10-14 days to maintain weight without further loss. - Continue category two eating plan - Continue regular exercise - Refill Zepbound   5 mg every 10-14 day as needed based on weight and hunger -Will need to decrease her Zepbound  dose if her weight loss continues into an unhealthy level  Emotional eating behaviors Emotional eating behaviors are managed with Wellbutrin  and topiramate . She adheres to the eating plan approximately sixty percent of the time. - Refill Wellbutrin  - Refill topiramate   Prediabetes Prediabetes is managed with metformin  and lifestyle modifications. Recent weight loss has improved BMI to 24.5, indicating progress. Blood sugar levels will be monitored with upcoming lab work. - Refill metformin  - Order lab work for blood sugar levels  Hyperlipidemia, unspecified Hyperlipidemia is managed with Lipitor. Lipid panel will be included in upcoming lab work to assess current status. - Order lab work for lipid levels  Hypothyroidism, unspecified Hypothyroidism is monitored with upcoming lab work to include thyroid  function tests. - Order lab work for thyroid  function  Vitamin D  deficiency Vitamin D  deficiency is managed with supplementation. She takes vitamin D  every other week as advised. - Refill vitamin D  - Order  lab work for vitamin D  levels    She was informed of the importance of frequent follow up visits to maximize her success with intensive lifestyle modifications for her multiple health conditions.    Jasmine Mesi, MD

## 2023-09-03 DIAGNOSIS — E7849 Other hyperlipidemia: Secondary | ICD-10-CM | POA: Diagnosis not present

## 2023-09-03 DIAGNOSIS — R7303 Prediabetes: Secondary | ICD-10-CM | POA: Diagnosis not present

## 2023-09-03 DIAGNOSIS — E559 Vitamin D deficiency, unspecified: Secondary | ICD-10-CM | POA: Diagnosis not present

## 2023-09-04 ENCOUNTER — Ambulatory Visit (INDEPENDENT_AMBULATORY_CARE_PROVIDER_SITE_OTHER): Payer: Self-pay | Admitting: Family Medicine

## 2023-09-04 LAB — TSH: TSH: 3.46 u[IU]/mL (ref 0.450–4.500)

## 2023-09-04 LAB — CBC WITH DIFFERENTIAL/PLATELET
Basophils Absolute: 0.1 10*3/uL (ref 0.0–0.2)
Basos: 1 %
EOS (ABSOLUTE): 0.2 10*3/uL (ref 0.0–0.4)
Eos: 4 %
Hematocrit: 36.5 % (ref 34.0–46.6)
Hemoglobin: 11.7 g/dL (ref 11.1–15.9)
Immature Grans (Abs): 0 10*3/uL (ref 0.0–0.1)
Immature Granulocytes: 0 %
Lymphocytes Absolute: 1.2 10*3/uL (ref 0.7–3.1)
Lymphs: 26 %
MCH: 30.7 pg (ref 26.6–33.0)
MCHC: 32.1 g/dL (ref 31.5–35.7)
MCV: 96 fL (ref 79–97)
Monocytes Absolute: 0.5 10*3/uL (ref 0.1–0.9)
Monocytes: 11 %
Neutrophils Absolute: 2.7 10*3/uL (ref 1.4–7.0)
Neutrophils: 58 %
Platelets: 276 10*3/uL (ref 150–450)
RBC: 3.81 x10E6/uL (ref 3.77–5.28)
RDW: 12.8 % (ref 11.7–15.4)
WBC: 4.6 10*3/uL (ref 3.4–10.8)

## 2023-09-04 LAB — CMP14+EGFR
ALT: 19 IU/L (ref 0–32)
AST: 25 IU/L (ref 0–40)
Albumin: 4.3 g/dL (ref 3.8–4.8)
Alkaline Phosphatase: 97 IU/L (ref 44–121)
BUN/Creatinine Ratio: 33 — ABNORMAL HIGH (ref 12–28)
BUN: 33 mg/dL — ABNORMAL HIGH (ref 8–27)
Bilirubin Total: 0.3 mg/dL (ref 0.0–1.2)
CO2: 19 mmol/L — ABNORMAL LOW (ref 20–29)
Calcium: 9.3 mg/dL (ref 8.7–10.3)
Chloride: 101 mmol/L (ref 96–106)
Creatinine, Ser: 1 mg/dL (ref 0.57–1.00)
Globulin, Total: 2.1 g/dL (ref 1.5–4.5)
Glucose: 68 mg/dL — ABNORMAL LOW (ref 70–99)
Potassium: 3.7 mmol/L (ref 3.5–5.2)
Sodium: 135 mmol/L (ref 134–144)
Total Protein: 6.4 g/dL (ref 6.0–8.5)
eGFR: 60 mL/min/{1.73_m2} (ref >59)

## 2023-09-04 LAB — LIPID PANEL WITH LDL/HDL RATIO
Cholesterol, Total: 98 mg/dL — ABNORMAL LOW (ref 100–199)
HDL: 53 mg/dL (ref >39)
LDL Chol Calc (NIH): 33 mg/dL (ref 0–99)
LDL/HDL Ratio: 0.6 ratio (ref 0.0–3.2)
Triglycerides: 49 mg/dL (ref 0–149)
VLDL Cholesterol Cal: 12 mg/dL (ref 5–40)

## 2023-09-04 LAB — HEMOGLOBIN A1C
Est. average glucose Bld gHb Est-mCnc: 100 mg/dL
Hgb A1c MFr Bld: 5.1 % (ref 4.8–5.6)

## 2023-09-04 LAB — VITAMIN B12: Vitamin B-12: 1064 pg/mL (ref 232–1245)

## 2023-09-04 LAB — VITAMIN D 25 HYDROXY (VIT D DEFICIENCY, FRACTURES): Vit D, 25-Hydroxy: 85.2 ng/mL (ref 30.0–100.0)

## 2023-09-04 LAB — INSULIN, RANDOM: INSULIN: 3.5 u[IU]/mL (ref 2.6–24.9)

## 2023-09-08 DIAGNOSIS — M546 Pain in thoracic spine: Secondary | ICD-10-CM | POA: Diagnosis not present

## 2023-09-16 ENCOUNTER — Other Ambulatory Visit (INDEPENDENT_AMBULATORY_CARE_PROVIDER_SITE_OTHER): Payer: Self-pay | Admitting: Family Medicine

## 2023-09-16 DIAGNOSIS — R7303 Prediabetes: Secondary | ICD-10-CM

## 2023-09-22 DIAGNOSIS — M546 Pain in thoracic spine: Secondary | ICD-10-CM | POA: Diagnosis not present

## 2023-09-25 ENCOUNTER — Ambulatory Visit: Admitting: Adult Health

## 2023-09-25 VITALS — BP 98/64

## 2023-09-25 DIAGNOSIS — G43709 Chronic migraine without aura, not intractable, without status migrainosus: Secondary | ICD-10-CM

## 2023-09-25 MED ORDER — ONABOTULINUMTOXINA 100 UNITS IJ SOLR
155.0000 [IU] | Freq: Once | INTRAMUSCULAR | Status: AC
  Administered 2023-09-25: 155 [IU] via INTRAMUSCULAR

## 2023-09-25 NOTE — Progress Notes (Signed)
 Botox - 100 units x 2 vial Lot: [id number] Expiration: 12/2025 NDC: 9976-8854-98   Bacteriostatic 0.9% Sodium Chloride - 4 mL  Lot: [id number] Expiration: 01/29/2024 NDC: 9590-8033-97   Dx: H56.290 B/B  Witnessed by: Rojean DEL

## 2023-09-25 NOTE — Progress Notes (Signed)
 Update 09/25/2023 JM: Patient returns for repeat Botox .  Prior injection with Dr. Ines 06/05/2023.  Reports continued benefit with Botox  injections.  Reports benefit with masseter injections for jaw pain/clenching that can trigger migraine headaches.  Tolerated procedure well today.  Return in 3 months for repeat injection.        Consent Form Botulism Toxin Injection For Chronic Migraine    Reviewed orally with patient, additionally signature is on file:  Botulism toxin has been approved by the Federal drug administration for treatment of chronic migraine. Botulism toxin does not cure chronic migraine and it may not be effective in some patients.  The administration of botulism toxin is accomplished by injecting a small amount of toxin into the muscles of the neck and head. Dosage must be titrated for each individual. Any benefits resulting from botulism toxin tend to wear off after 3 months with a repeat injection required if benefit is to be maintained. Injections are usually done every 3-4 months with maximum effect peak achieved by about 2 or 3 weeks. Botulism toxin is expensive and you should be sure of what costs you will incur resulting from the injection.  The side effects of botulism toxin use for chronic migraine may include:   -Transient, and usually mild, facial weakness with facial injections  -Transient, and usually mild, head or neck weakness with head/neck injections  -Reduction or loss of forehead facial animation due to forehead muscle weakness  -Eyelid drooping  -Dry eye  -Pain at the site of injection or bruising at the site of injection  -Double vision  -Potential unknown long term risks   Contraindications: You should not have Botox  if you are pregnant, nursing, allergic to albumin, have an infection, skin condition, or muscle weakness at the site of the injection, or have myasthenia gravis, Lambert-Eaton syndrome, or ALS.  It is also possible that as  with any injection, there may be an allergic reaction or no effect from the medication. Reduced effectiveness after repeated injections is sometimes seen and rarely infection at the injection site may occur. All care will be taken to prevent these side effects. If therapy is given over a long time, atrophy and wasting in the muscle injected may occur. Occasionally the patient's become refractory to treatment because they develop antibodies to the toxin. In this event, therapy needs to be modified.  I have read the above information and consent to the administration of botulism toxin.    BOTOX  PROCEDURE NOTE FOR MIGRAINE HEADACHE  Contraindications and precautions discussed with patient(above). Aseptic procedure was observed and patient tolerated procedure. Procedure performed by Harlene Bogaert, AGNP-BC.   The condition has existed for more than 6 months, and pt does not have a diagnosis of ALS, Myasthenia Gravis or Lambert-Eaton Syndrome.  Risks and benefits of injections discussed and pt agrees to proceed with the procedure.  Written consent obtained  These injections are medically necessary. Pt  receives good benefits from these injections. These injections do not cause sedations or hallucinations which the oral therapies may cause.   Description of procedure:  The patient was placed in a sitting position. The standard protocol was used for Botox  as follows, with 5 units of Botox  injected at each site:  -Procerus muscle, midline injection  -Corrugator muscle, bilateral injection  -Frontalis muscle, bilateral injection, with 2 sites each side, medial injection was performed in the upper one third of the frontalis muscle, in the region vertical from the medial inferior edge of the  superior orbital rim. The lateral injection was again in the upper one third of the forehead vertically above the lateral limbus of the cornea, 1.5 cm lateral to the medial injection site.  -Temporalis muscle  injection, 4 sites, bilaterally. The first injection was 3 cm above the tragus of the ear, second injection site was 1.5 cm to 3 cm up from the first injection site in line with the tragus of the ear. The third injection site was 1.5-3 cm forward between the first 2 injection sites. The fourth injection site was 1.5 cm posterior to the second injection site. 5th site laterally in the temporalis  muscleat the level of the outer canthus.  -Occipitalis muscle injection, 3 sites, bilaterally. The first injection was done one half way between the occipital protuberance and the tip of the mastoid process behind the ear. The second injection site was done lateral and superior to the first, 1 fingerbreadth from the first injection. The third injection site was 1 fingerbreadth superiorly and medially from the first injection site.  -Cervical paraspinal muscle injection, 2 sites, bilaterally. The first injection site was 1 cm from the midline of the cervical spine, 3 cm inferior to the lower border of the occipital protuberance. The second injection site was 1.5 cm superiorly and laterally to the first injection site.  -Trapezius muscle injection was performed at 3 sites, bilaterally. The first injection site was in the upper trapezius muscle halfway between the inflection point of the neck, and the acromion. The second injection site was one half way between the acromion and the first injection site. The third injection was done between the first injection site and the inflection point of the neck.  -Masseter muscle injection was performed at 1 site, bilaterally    A total of 200 units of Botox  was prepared, 165 units of Botox  was injected as documented above, any Botox  not injected was wasted. The patient tolerated the procedure well, there were no complications of the above procedure.   Harlene Bogaert, AGNP-BC  [facility] [address] [place], [place] [address]  Phone  [phone] Fax [phone] Note: This document was prepared with digital dictation and possible smart phrase technology. Any transcriptional errors that result from this process are unintentional.

## 2023-10-02 ENCOUNTER — Other Ambulatory Visit (INDEPENDENT_AMBULATORY_CARE_PROVIDER_SITE_OTHER): Payer: Self-pay | Admitting: Family Medicine

## 2023-10-02 DIAGNOSIS — E669 Obesity, unspecified: Secondary | ICD-10-CM

## 2023-10-10 ENCOUNTER — Other Ambulatory Visit (INDEPENDENT_AMBULATORY_CARE_PROVIDER_SITE_OTHER): Payer: Self-pay | Admitting: Family Medicine

## 2023-10-10 DIAGNOSIS — E669 Obesity, unspecified: Secondary | ICD-10-CM

## 2023-10-12 ENCOUNTER — Other Ambulatory Visit (INDEPENDENT_AMBULATORY_CARE_PROVIDER_SITE_OTHER): Payer: Self-pay | Admitting: Family Medicine

## 2023-10-12 DIAGNOSIS — F3289 Other specified depressive episodes: Secondary | ICD-10-CM

## 2023-10-17 DIAGNOSIS — J309 Allergic rhinitis, unspecified: Secondary | ICD-10-CM | POA: Diagnosis not present

## 2023-10-17 DIAGNOSIS — Z0000 Encounter for general adult medical examination without abnormal findings: Secondary | ICD-10-CM | POA: Diagnosis not present

## 2023-10-17 DIAGNOSIS — L659 Nonscarring hair loss, unspecified: Secondary | ICD-10-CM | POA: Diagnosis not present

## 2023-10-17 DIAGNOSIS — F331 Major depressive disorder, recurrent, moderate: Secondary | ICD-10-CM | POA: Diagnosis not present

## 2023-10-17 DIAGNOSIS — D649 Anemia, unspecified: Secondary | ICD-10-CM | POA: Diagnosis not present

## 2023-10-17 DIAGNOSIS — K219 Gastro-esophageal reflux disease without esophagitis: Secondary | ICD-10-CM | POA: Diagnosis not present

## 2023-10-17 DIAGNOSIS — G479 Sleep disorder, unspecified: Secondary | ICD-10-CM | POA: Diagnosis not present

## 2023-10-27 ENCOUNTER — Ambulatory Visit (INDEPENDENT_AMBULATORY_CARE_PROVIDER_SITE_OTHER): Admitting: Family Medicine

## 2023-10-27 ENCOUNTER — Encounter (INDEPENDENT_AMBULATORY_CARE_PROVIDER_SITE_OTHER): Payer: Self-pay | Admitting: Family Medicine

## 2023-10-27 VITALS — BP 92/67 | HR 87 | Temp 97.9°F | Ht 61.0 in | Wt 130.0 lb

## 2023-10-27 DIAGNOSIS — Z6824 Body mass index (BMI) 24.0-24.9, adult: Secondary | ICD-10-CM

## 2023-10-27 DIAGNOSIS — F3289 Other specified depressive episodes: Secondary | ICD-10-CM

## 2023-10-27 DIAGNOSIS — R7303 Prediabetes: Secondary | ICD-10-CM

## 2023-10-27 DIAGNOSIS — F5089 Other specified eating disorder: Secondary | ICD-10-CM | POA: Diagnosis not present

## 2023-10-27 DIAGNOSIS — E669 Obesity, unspecified: Secondary | ICD-10-CM

## 2023-10-27 MED ORDER — ZEPBOUND 5 MG/0.5ML ~~LOC~~ SOLN: 5.0000 mg | SUBCUTANEOUS | 1 refills | Status: DC

## 2023-10-27 MED ORDER — BUPROPION HCL ER (SR) 150 MG PO TB12: 150.0000 mg | ORAL_TABLET | Freq: Two times a day (BID) | ORAL | 0 refills | Status: DC

## 2023-10-27 MED ORDER — TOPIRAMATE 25 MG PO TABS: 25.0000 mg | ORAL_TABLET | Freq: Every day | ORAL | 0 refills | Status: DC

## 2023-10-27 NOTE — Progress Notes (Unsigned)
   SUBJECTIVE:  Chief Complaint: Obesity  Interim History: Patient mentions that she feels like she has well with weight maintenance.  She feels zepbound  has made a huge difference in her weight loss and maintenance.  She did discuss her most recent labs with Dr. Verena.  She is drinking a protein shake daily with [employer].  She is eating more melon- watermelon and cantaloupe.  She finds sticking to Category 2 to be easiest.  She is taking zepbound  every 10 days. She is bringing her grandkids to [facility] for a week to spend time on with them.    Melissa Leach is here to discuss her progress with her obesity treatment plan. She is on the Category 2 Plan and states she is following her eating plan approximately 75 % of the time. She states she is walking 20 minutes 7 times per week.   OBJECTIVE: Visit Diagnoses: Problem List Items Addressed This Visit       Other   Prediabetes   Depression - Primary   Relevant Medications   buPROPion  (WELLBUTRIN  SR) 150 MG 12 hr tablet   topiramate  (TOPAMAX ) 25 MG tablet   Obesity, Beginning BMI 40.81   Relevant Medications   tirzepatide  (ZEPBOUND ) 5 MG/0.5ML injection vial   BMI 24.0-24.9, adult    Vitals Temp: 97.9 F (36.6 C) BP: 92/67 Pulse Rate: 87 SpO2: 100 %   Anthropometric Measurements Height: 5' 1 (1.549 m) Weight: 130 lb (59 kg) BMI (Calculated): 24.58 Weight at Last Visit: 130 lb Weight Lost Since Last Visit: 0 Weight Gained Since Last Visit: 0 Starting Weight: 216 lb Total Weight Loss (lbs): 86 lb (39 kg)   Body Composition  Body Fat %: 36 % Fat Mass (lbs): 47 lbs Muscle Mass (lbs): 79.6 lbs Total Body Water (lbs): 58.4 lbs Visceral Fat Rating : 9   Other Clinical Data Fasting: yes Labs: no Today's Visit #: 52 Starting Date: 04/08/19 Comments: Cat 2     ASSESSMENT AND PLAN: Assessment & Plan Emotional Eating Behavior Patient doing well on bupropion  and topiramate .  She feels this is managing her emotional  eating tendencies well.  Will refill both at current dose and follow up on symptom management at next appointment. Pre-diabetes Patient is working on limiting simple carbohydrates and is now on pharmacotherapy for assistance.  No significant GI side effects of her medication.  Continue nutritional modifications. Obesity with starting BMI of 40.81  BMI 24.0-24.9, adult    Diet: [name] is currently in the action stage of change. As such, her goal is to continue with weight loss efforts and has agreed to the Category 2 Plan.   Exercise:  Older adults should do exercises that maintain or improve balance if they are at risk of falling.  and Older adults should determine their level of effort for physical activity relative to their level of fitness.  Behavior Modification:  We discussed the following Behavioral Modification Strategies today: increasing lean protein intake, decreasing simple carbohydrates, increasing vegetables, meal planning and cooking strategies, and planning for success.   Return in about 8 weeks (around 12/22/2023).   She was informed of the importance of frequent follow up visits to maximize her success with intensive lifestyle modifications for her multiple health conditions.  Attestation Statements:   Reviewed by clinician on day of visit: allergies, medications, problem list, medical history, surgical history, family history, social history, and previous encounter notes.     Adelita Cho, MD

## 2023-10-29 NOTE — Assessment & Plan Note (Signed)
 Patient doing well on bupropion  and topiramate .  She feels this is managing her emotional eating tendencies well.  Will refill both at current dose and follow up on symptom management at next appointment.

## 2023-10-29 NOTE — Assessment & Plan Note (Signed)
 Patient is working on limiting simple carbohydrates and is now on pharmacotherapy for assistance.  No significant GI side effects of her medication.  Continue nutritional modifications.

## 2023-11-02 ENCOUNTER — Encounter (INDEPENDENT_AMBULATORY_CARE_PROVIDER_SITE_OTHER): Payer: Self-pay | Admitting: Family Medicine

## 2023-11-03 DIAGNOSIS — M546 Pain in thoracic spine: Secondary | ICD-10-CM | POA: Diagnosis not present

## 2023-11-03 NOTE — Telephone Encounter (Signed)
Called pt and LVM to give us a call back

## 2023-11-03 NOTE — Telephone Encounter (Signed)
 Pt called back and wanted to know if she should stop taking metformin  do to the light headedness. She wanted to know if she still should take her Metformin  and zepbound  together?

## 2023-12-08 ENCOUNTER — Encounter: Payer: Self-pay | Admitting: Neurology

## 2023-12-08 ENCOUNTER — Ambulatory Visit: Admitting: Neurology

## 2023-12-08 VITALS — BP 110/66 | HR 66 | Ht 61.5 in

## 2023-12-08 DIAGNOSIS — G43709 Chronic migraine without aura, not intractable, without status migrainosus: Secondary | ICD-10-CM

## 2023-12-08 NOTE — Progress Notes (Signed)
 [facility]    Provider:  Dr Ines Requesting Provider: Louann Penton, MD Primary Care Provider:  Verena Mems, MD  CC:  Facial pain  12/08/2023: patint has been getting botox  since 2022 for migraines. Here for follow up. Patient stays with me.  We discussed botox , she is doing well. Will continue botox  with some changes, discussed masseter injections can cause skin sagging and she understands that but has clenchig making her migraines worse and masseter injections help tremendously 10units.  No other focal neurologic deficits, associated symptoms, inciting events or modifiable factors.Patient complains of symptoms per HPI as well as the following symptoms: nonw . Pertinent negatives and positives per HPI. All others negative   Update 09/25/2023 JM: Patient returns for repeat Botox .  Prior injection with Dr. Ines 06/05/2023.  Reports continued benefit with Botox  injections.  Reports benefit with masseter injections for jaw pain/clenching that can trigger migraine headaches.  Tolerated procedure well today.  Return in 3 months for repeat injection.  06/05/2023: Stable. Patient feels that her migraines cause her jaw to ache in her jaw aching also can cause her migraines to worsen it is a trigger for migraines included 10 units in each masseter to see if that helps with migraine severity. 03/13/2023: stable, doing great. Patient feels that her migraines cause her jaw to ache in her jaw aching also can cause her migraines to worsen it is a trigger for migraines included 10 units in each masseter to see if that helps with migraine severity. 12/17/2022: doing great, stable 09/18/2022: stable 03/20/2022: stable  12/19/2021: stable 09/26/2021: stable. : Doing great, >>70% decrease migraines.   HPI:  Melissa Leach is a 73 y.o. female here as requested by Penton Louann  for pain in the face. She has a history of migraines. She has migraines 2x a month and they can last days, used to be  incapacitating, up to 8 migraine days a month and she wakes up with headaches all the time on the top of the head 15 a month, she doesn't sleep well, on the right side, pulsating/pounding/throbbing, light sensitivity, movement makes it worse, nausea, topamax  and imitrex  have helped. She also has clenching, much worse through the morning and afternoon, she can't control it, she has popping on the right, she feels she has some mouth movements, happens all day long, unknowingly. Her jaw hurts, she hates it, she has tried a lot of medications, been to the dentist, tried muscle relaxers, OTC analgesics, pain medication, PT?OT, nothing is helping and gives her side effects. No other focal neurologic deficits, associated symptoms, inciting events or modifiable factors.  Reviewed notes, labs and imaging from outside physicians, which showed:  From a thorough review of records, medications tried: tylenol , ASA, flexeril , dexamethasone , benadryl , hydrocodone , ativan , ketorolac , mobic , robaxin , reglan , zofran , amitriptyline, lyrica , phenergan , zoloft , imitrex , topamax , verapamil  Cbc/cmp unremarkable  CT head 2003: TECHNIQUE: (reviewed report) 5 MM AXIAL IMAGES WERE OBTAINED FROM THE SKULL BASE THROUGH THE BRAIN TO THE VERTEX.  THERE ARE NO  PRIOR IMAGING STUDIES OF THE BRAIN AVAILABLE FOR COMPARISON.  FINDINGS:  A LARGE SUBCUTANEOUS HEMATOMA IN THE LEFT FRONTAL REGION IS NOTED.  THE VENTRICULAR SYSTEM IS  NORMAL IN SIZE AND APPEARANCE FOR AGE.  THERE IS NO MASS EFFECT OR MIDLINE SHIFT.  THERE IS NO  HEMORRHAGE OR HEMATOMA.  NO EXTRA-AXIAL FLUID COLLECTIONS ARE IDENTIFIED.  THERE ARE NO FOCAL BRAIN  PARENCHYMAL ABNORMALITIES.  BONE WINDOW IMAGES DEMONSTRATE NO SKULL FRACTURES.  THE VISUALIZED PARANASAL SINUSES  AND MASTOID  AIR CELLS APPEAR WELL AERATED.  IMPRESSION  1.   NORMAL INTRACRANIALLY.  2.   LEFT FRONTAL SUBCUTANEOUS HEMATOMA.  Review of Systems: Patient complains of symptoms per HPI as well as  the following symptoms:jaw pain . Pertinent negatives and positives per HPI. All others negative.   Social History   Socioeconomic History   Marital status: Widowed    Spouse name: Not on file   Number of children: 2   Years of education: Not on file   Highest education level: Not on file  Occupational History   Occupation: Retired    Associate Professor: [employer]  Tobacco Use   Smoking status: Former    Current packs/day: 0.00    Types: Cigarettes    Quit date: 10/21/1976    Years since quitting: 47.1   Smokeless tobacco: Never   Tobacco comments:    QUIT SMOKING OVER 25 YRS AGO  Vaping Use   Vaping status: Never Used  Substance and Sexual Activity   Alcohol use: Yes    Alcohol/week: 2.0 standard drinks of alcohol    Types: 2 Standard drinks or equivalent per week    Comment: Socially   Drug use: No   Sexual activity: Not Currently    Partners: Male    Birth control/protection: Post-menopausal    Comment: >17 @ [facility], No Hx of STDs, No Abn paps, No DES exposure, No Female Cancer, Hx of osteopenia.  Other Topics Concern   Not on file  Social History Narrative   Lives alone    Right handed   Caffeine: diet coke, 48 oz/day   Social Drivers of Corporate investment banker Strain: Not on file  Food Insecurity: Not on file  Transportation Needs: Not on file  Physical Activity: Not on file  Stress: Not on file  Social Connections: Not on file  Intimate Partner Violence: Not on file    Family History  Problem Relation Age of Onset   Diabetes Mother    Hypertension Mother    Thyroid  disease Mother    Depression Mother    Anxiety disorder Mother    Obesity Mother    Thyroid  disease Father    Heart failure Father    Hypertension Father    Hyperlipidemia Father    Heart disease Father    Coronary artery disease Father     Past Medical History:  Diagnosis Date   Anxiety    Arthritis    SEVERE PAIN RIGHT HIP   Blood in urine    ALWAYS  --AND STATES  ALWAYS TREATED FOR UTI--BUT HAS NEVER HAD UTI.   Cellulitis    Chronic back pain    Complication of anesthesia    SCRATCHED CORNEA  WAKING UP FROM  KNEE SURGERY--'82   Depression    PT'S HUSBAND DIED OF CANCER AT [facility] 03/01/2012   Gastric ulcer    Gastritis    HISTORY OF GASTRITIS--OCCAS ABDOMINAL PAIN    GERD (gastroesophageal reflux disease)    takes Omeprazole daily and states only bc she takes Mobic    H/O hiatal hernia    Headache(784.0)    MIGRAINE- INFREQUENT   History of migraine    takes Topamax  daily and Imitrex  prn   History of shingles    Hyperlipidemia    takes Lipitor daily   Hypothyroidism 05/13/2012   Insomnia    takes Xanax  and Ativan  prn   Joint pain    Joint swelling    Lichenoid dermatitis 03/16/2007  perianal biopsy   Nocturia    Osteoarthritis    Osteopenia    Pre-diabetes    Recurrent sinus infections    Stomach ulcer    Vitamin D  deficiency     Patient Active Problem List   Diagnosis Date Noted   BMI 24.0-24.9, adult 09/01/2023   Sarcopenia 06/30/2023   BMI 26.0-26.9,adult 05/15/2023   Xerostomia 01/28/2023   Polyphagia 01/28/2023   Grade I diastolic dysfunction 11/27/2022   BMI 34.0-34.9,adult 05/22/2022   Dehydration 04/24/2022   BMI 33.0-33.9,adult 04/24/2022   Obesity, Beginning BMI 40.81 04/24/2022   Restless legs 09/06/2021   Depression 07/19/2021   S/P revision of total knee, right 05/31/2021   Chronic migraine without aura without status migrainosus, not intractable 08/11/2020   Bruxism 08/11/2020   Hypothyroidism 05/08/2020   Status post reverse total shoulder replacement, left 04/07/2020   Insulin  resistance 10/13/2019   Pure hypercholesterolemia 07/26/2019   Other specified hypothyroidism 07/26/2019   Vitamin D  deficiency 05/11/2019   Prediabetes 05/11/2019   Pain of right shoulder joint on movement 05/06/2017   S/P shoulder replacement, right 01/03/2017   Lichenoid dermatitis 05/17/2016   Gallstones 08/11/2012    Postoperative wound infection 07/21/2012   Expected blood loss anemia 03/04/2012   S/P right THA, AA 03/02/2012   History of revision of total replacement of right hip joint 03/02/2012   Class 3 severe obesity with serious comorbidity and body mass index (BMI) of 40.0 to 44.9 in adult 12/10/2011    Past Surgical History:  Procedure Laterality Date   COLONOSCOPY     epidural injections     x 2 in the past 4weeks   ESOPHAGOGASTRODUODENOSCOPY     EYE SURGERY  04/01/1992   RA surgery-   HEMATOMA EVACUATION  12/09/2011   Procedure: EVACUATION HEMATOMA;  Surgeon: Donnice JONETTA Car, MD;  Location: [facility];  Service: Orthopedics;  Laterality: Right;  Evacuation of Hematoma Right Knee, I & D and Closed Manipulation of Right Knee   IRRIGATION AND DEBRIDEMENT KNEE  12/09/2011   Procedure: IRRIGATION AND DEBRIDEMENT KNEE;  Surgeon: Donnice JONETTA Car, MD;  Location: [facility];  Service: Orthopedics;  Laterality: Right;   JOINT REPLACEMENT     RIGHT TOTAL KNEE REPLACEMENT AND RT TOTAL KNEE REVISION    AND  LEFT TOTAL KNEE REPLACEMENT   KNEE CLOSED REDUCTION  12/09/2011   Procedure: CLOSED MANIPULATION KNEE;  Surgeon: Donnice JONETTA Car, MD;  Location: [facility];  Service: Orthopedics;  Laterality: Right;   LUMBAR SPINE SURGERY  05/15/2012   3 disc fusion   REVERSE SHOULDER ARTHROPLASTY Left 04/07/2020   Procedure: REVERSE SHOULDER ARTHROPLASTY;  Surgeon: Kay Kemps, MD;  Location: [facility];  Service: Orthopedics;  Laterality: Left;   right carpal tunnel surgery      TONSILLECTOMY     TOTAL HIP ARTHROPLASTY  06/25/2011   Procedure: TOTAL HIP ARTHROPLASTY ANTERIOR APPROACH;  Surgeon: Donnice JONETTA Car, MD;  Location: [facility];  Service: Orthopedics;  Laterality: Left;   TOTAL HIP ARTHROPLASTY  03/02/2012   Procedure: TOTAL HIP ARTHROPLASTY ANTERIOR APPROACH;  Surgeon: Donnice JONETTA Car, MD;  Location: [facility];  Service: Orthopedics;  Laterality: Right;   TOTAL KNEE REVISION  10/28/2011   Procedure: TOTAL KNEE REVISION;   Surgeon: Donnice JONETTA Car, MD;  Location: [facility];  Service: Orthopedics;  Laterality: Right;   TOTAL KNEE REVISION Right 05/31/2021   Procedure: TOTAL KNEE REVISION/FEMORAL COMPONENT;  Surgeon: Car Donnice, MD;  Location: [facility];  Service: Orthopedics;  Laterality: Right;  TOTAL SHOULDER ARTHROPLASTY Right 01/03/2017   Procedure: RIGHT SHOULDER ANATOMIC SHOULDER REPLACEMENT AND ROTATOR CUFF REPAIR;  Surgeon: Kay Kemps, MD;  Location: [facility];  Service: Orthopedics;  Laterality: Right;   TUBAL LIGATION     WOUND EXPLORATION Bilateral 07/06/2012   Procedure: WOUND EXPLORATION lumbar;  Surgeon: Victory DELENA Gunnels, MD;  Location: [facility];  Service: Neurosurgery;  Laterality: Bilateral;  Incision and Drainage    Current Outpatient Medications  Medication Sig Dispense Refill   atorvastatin  (LIPITOR) 40 MG tablet Take 40 mg by mouth every evening.     BREO ELLIPTA 100-25 MCG/ACT AEPB Inhale 1 puff into the lungs daily.     buPROPion  (WELLBUTRIN  SR) 150 MG 12 hr tablet Take 1 tablet (150 mg total) by mouth 2 (two) times daily. 180 tablet 0   Calcium  Carb-Cholecalciferol (CALCIUM  600+D3 PO) Take 1 tablet by mouth daily.     celecoxib (CELEBREX) 200 MG capsule Take 200 mg by mouth daily as needed.     cetirizine (ZYRTEC) 10 MG tablet Take 10 mg by mouth daily.     clobetasol  ointment (TEMOVATE ) 0.05 % Apply 1 application topically 2 (two) times daily. 30 g 3   doxycycline  (VIBRAMYCIN ) 50 MG capsule Take 50 mg by mouth daily.     levothyroxine  (SYNTHROID ) 75 MCG tablet Take 75 mcg by mouth daily before breakfast.     Multiple Vitamin (MULTIVITAMIN WITH MINERALS) TABS Take 1 tablet by mouth daily.     Multiple Vitamins-Minerals (PRESERVISION AREDS 2+MULTI VIT PO) Take 1 capsule by mouth 2 (two) times daily.     pantoprazole  (PROTONIX ) 40 MG tablet Take 40 mg by mouth daily.     sertraline  (ZOLOFT ) 50 MG tablet Take 100 mg by mouth daily.     SUMAtriptan  (IMITREX ) 100 MG tablet Take 100 mg by mouth every  2 (two) hours as needed for migraine.     tirzepatide  (ZEPBOUND ) 5 MG/0.5ML injection vial Inject 5 mg into the skin once a week. 2 mL 1   Vitamin D , Ergocalciferol , (DRISDOL ) 1.25 MG (50000 UNIT) CAPS capsule TAKE 1 CAPSULE BY MOUTH ONE TIME PER WEEK 5 capsule 1   No current facility-administered medications for this visit.    Allergies as of 12/08/2023 - Review Complete 12/08/2023  Allergen Reaction Noted   Ceftriaxone  Other (See Comments) 09/17/2012   Cephalosporins Dermatitis 05/22/2012   Sulfa antibiotics Rash 06/13/2011    Vitals: BP 110/66 (Cuff Size: Normal)   Pulse 66   Ht 5' 1.5 (1.562 m)   LMP 12/27/2002   BMI 24.17 kg/m  Last Weight:  Wt Readings from Last 1 Encounters:  10/27/23 130 lb (59 kg)   Last Height:   Ht Readings from Last 1 Encounters:  12/08/23 5' 1.5 (1.562 m)     Physical exam: Exam: Gen: NAD, conversant      CV: No palpitations or chest pain or SOB. VS: Breathing at a normal rate. Not febrile. Eyes: Conjunctivae clear without exudates or hemorrhage  Neuro: Detailed Neurologic Exam  Speech:    Speech is normal; fluent and spontaneous with normal comprehension.  Cognition:    The patient is oriented to person, place, and time;     recent and remote memory intact;     language fluent;     normal attention, concentration, fund of knowledge Cranial Nerves:    The pupils are equal, round, and reactive to light. Visual fields are full Extraocular movements are intact.  The face is symmetric with normal sensation.  The palate elevates in the midline. Hearing intact. Voice is normal. Shoulder shrug is normal. The tongue has normal motion without fasciculations.   Coordination: normal  Gait:    No abnormalities noted or reported  Motor Observation:   no involuntary movements noted. Tone:    Appears normal  Posture:    Posture is normal. normal erect    Strength:    Strength is anti-gravity and symmetric in the upper and lower limbs.       Sensation: intact to LT, no reports of numbness or tingling or paresthesias       Assessment/Plan:  73 year old with chronic migraines. Recommend botox .   Continue botox  for prevention > 70% improvement in migraine and headache freq and severity Sumatriptan  prn     No orders of the defined types were placed in this encounter.  No orders of the defined types were placed in this encounter.   Cc: Verena Mems, MD,  Louann Penton MD  Onetha Epp, MD  [facility] [address] [place], [place] [address]  Phone [phone] Fax [phone]  I spent 15 minutes of face-to-face and non-face-to-face time with patient on the No diagnosis found. diagnosis.  This included previsit chart review, lab review, study review, order entry, electronic health record documentation, patient education on the different diagnostic and therapeutic options, counseling and coordination of care, risks and benefits of management, compliance, or risk factor reduction

## 2023-12-09 ENCOUNTER — Other Ambulatory Visit (INDEPENDENT_AMBULATORY_CARE_PROVIDER_SITE_OTHER): Payer: Self-pay | Admitting: Family Medicine

## 2023-12-09 DIAGNOSIS — E669 Obesity, unspecified: Secondary | ICD-10-CM

## 2023-12-11 ENCOUNTER — Other Ambulatory Visit (INDEPENDENT_AMBULATORY_CARE_PROVIDER_SITE_OTHER): Payer: Self-pay | Admitting: Family Medicine

## 2023-12-11 DIAGNOSIS — E669 Obesity, unspecified: Secondary | ICD-10-CM

## 2023-12-16 DIAGNOSIS — Z23 Encounter for immunization: Secondary | ICD-10-CM | POA: Diagnosis not present

## 2023-12-16 DIAGNOSIS — Z01818 Encounter for other preprocedural examination: Secondary | ICD-10-CM | POA: Diagnosis not present

## 2023-12-16 DIAGNOSIS — G43909 Migraine, unspecified, not intractable, without status migrainosus: Secondary | ICD-10-CM | POA: Diagnosis not present

## 2023-12-16 DIAGNOSIS — F331 Major depressive disorder, recurrent, moderate: Secondary | ICD-10-CM | POA: Diagnosis not present

## 2023-12-16 DIAGNOSIS — J439 Emphysema, unspecified: Secondary | ICD-10-CM | POA: Diagnosis not present

## 2023-12-16 DIAGNOSIS — K219 Gastro-esophageal reflux disease without esophagitis: Secondary | ICD-10-CM | POA: Diagnosis not present

## 2023-12-16 DIAGNOSIS — Z411 Encounter for cosmetic surgery: Secondary | ICD-10-CM | POA: Diagnosis not present

## 2023-12-16 DIAGNOSIS — E039 Hypothyroidism, unspecified: Secondary | ICD-10-CM | POA: Diagnosis not present

## 2023-12-18 ENCOUNTER — Ambulatory Visit: Admitting: Adult Health

## 2023-12-18 ENCOUNTER — Ambulatory Visit: Admitting: Neurology

## 2023-12-22 ENCOUNTER — Ambulatory Visit (INDEPENDENT_AMBULATORY_CARE_PROVIDER_SITE_OTHER): Admitting: Family Medicine

## 2023-12-22 ENCOUNTER — Encounter (INDEPENDENT_AMBULATORY_CARE_PROVIDER_SITE_OTHER): Payer: Self-pay | Admitting: Family Medicine

## 2023-12-22 VITALS — BP 101/68 | HR 82 | Temp 97.5°F | Ht 61.0 in | Wt 127.0 lb

## 2023-12-22 DIAGNOSIS — Z6824 Body mass index (BMI) 24.0-24.9, adult: Secondary | ICD-10-CM | POA: Diagnosis not present

## 2023-12-22 DIAGNOSIS — F3289 Other specified depressive episodes: Secondary | ICD-10-CM | POA: Diagnosis not present

## 2023-12-22 DIAGNOSIS — F5089 Other specified eating disorder: Secondary | ICD-10-CM

## 2023-12-22 DIAGNOSIS — E669 Obesity, unspecified: Secondary | ICD-10-CM | POA: Diagnosis not present

## 2023-12-22 DIAGNOSIS — J302 Other seasonal allergic rhinitis: Secondary | ICD-10-CM

## 2023-12-22 NOTE — Progress Notes (Signed)
 Office: [phone]  /  Fax: [phone]  WEIGHT SUMMARY AND BIOMETRICS  Anthropometric Measurements Height: 5' 1 (1.549 m) Weight: 127 lb (57.6 kg) BMI (Calculated): 24.01 Weight at Last Visit: 130 lb Weight Lost Since Last Visit: 3 lb Weight Gained Since Last Visit: 0 Starting Weight: 216 lb Total Weight Loss (lbs): 89 lb (40.4 kg) Peak Weight: 216 lb   Body Composition  Body Fat %: 32.3 % Fat Mass (lbs): 41 lbs Muscle Mass (lbs): 81.6 lbs Total Body Water (lbs): 55.8 lbs Visceral Fat Rating : 8   Other Clinical Data Fasting: yes Labs: no Today's Visit #: 40 Starting Date: 04/08/19 Comments: cat 2    Chief Complaint: OBESITY   History of Present Illness Melissa Leach is a 73 year old female who presents for a follow-up on her obesity treatment and progress assessment.  She is in the maintenance phase of her obesity treatment, successfully avoiding weight regain and losing an additional three pounds, bringing her BMI to 24.01. She takes Zepbound  5 mg every ten days to aid in weight maintenance.  In addition to obesity management, she is addressing emotional eating behaviors as part of her treatment for depression with anxiety. She takes Zoloft  100 mg daily for this condition. She experiences cravings for sweet foods.  She experiences allergies, which flared up about three weeks ago, subsided, and then flared up again in the last few days. Her symptoms include burning eyes. She has Flonase but often forgets to use it.  She is considering cosmetic procedures due to changes in her facial appearance, which she describes as 'Ozempic face,' with hollows and sagging skin. She has consulted a Engineer, petroleum and is undergoing laser treatments at a dermatology center.  She has been trying to increase her protein intake, despite not liking meat, yogurt, or cottage cheese. She drinks one protein shake a day and has been given a recipe for protein pudding to help  with her dietary needs.  She engages in physical activities to manage neck and shoulder pain, including using resistance bands and a weighted vest to improve posture. She has removed all weights from the vest and wears it for short periods.  No issues with blood sugar since discontinuing metformin  and topiramate , which were stopped due to lightheadedness.      PHYSICAL EXAM:  Blood pressure 101/68, pulse 82, temperature (!) 97.5 F (36.4 C), height 5' 1 (1.549 m), weight 127 lb (57.6 kg), last menstrual period 12/27/2002, SpO2 94%. Body mass index is 24 kg/m.  DIAGNOSTIC DATA REVIEWED:  BMET    Component Value Date/Time   NA 135 09/03/2023 1008   K 3.7 09/03/2023 1008   CL 101 09/03/2023 1008   CO2 19 (L) 09/03/2023 1008   GLUCOSE 68 (L) 09/03/2023 1008   GLUCOSE 116 (H) 06/01/2021 0316   BUN 33 (H) 09/03/2023 1008   CREATININE 1.00 09/03/2023 1008   CREATININE 0.82 08/18/2012 0933   CALCIUM  9.3 09/03/2023 1008   GFRNONAA >60 06/01/2021 0316   GFRNONAA 78 08/18/2012 0933   GFRAA 76 03/14/2020 0000   GFRAA >89 08/18/2012 0933   Lab Results  Component Value Date   HGBA1C 5.1 09/03/2023   HGBA1C 5.6 04/08/2019   Lab Results  Component Value Date   INSULIN  3.5 09/03/2023   INSULIN  12.1 04/08/2019   Lab Results  Component Value Date   TSH 3.460 09/03/2023   CBC    Component Value Date/Time   WBC 4.6 09/03/2023 1008  WBC 8.2 06/02/2021 0314   RBC 3.81 09/03/2023 1008   RBC 3.50 (L) 06/02/2021 0314   HGB 11.7 09/03/2023 1008   HCT 36.5 09/03/2023 1008   PLT 276 09/03/2023 1008   MCV 96 09/03/2023 1008   MCH 30.7 09/03/2023 1008   MCH 30.9 06/02/2021 0314   MCHC 32.1 09/03/2023 1008   MCHC 31.2 06/02/2021 0314   RDW 12.8 09/03/2023 1008   Iron Studies No results found for: IRON, TIBC, FERRITIN, IRONPCTSAT Lipid Panel     Component Value Date/Time   CHOL 98 (L) 09/03/2023 1008   TRIG 49 09/03/2023 1008   HDL 53 09/03/2023 1008   LDLCALC 33  09/03/2023 1008   Hepatic Function Panel     Component Value Date/Time   PROT 6.4 09/03/2023 1008   ALBUMIN 4.3 09/03/2023 1008   AST 25 09/03/2023 1008   ALT 19 09/03/2023 1008   ALKPHOS 97 09/03/2023 1008   BILITOT 0.3 09/03/2023 1008      Component Value Date/Time   TSH 3.460 09/03/2023 1008   Nutritional Lab Results  Component Value Date   VD25OH 85.2 09/03/2023   VD25OH 66.0 03/18/2023   VD25OH 109.0 (H) 12/31/2022     Assessment and Plan Assessment & Plan Obesity Obesity is well-managed with a GLP-1 agonist (Zepbound ) and lifestyle modifications. She is in the maintenance phase, having lost an additional three pounds, resulting in a BMI of 24.01. The current regimen includes Zepbound  5 mg every ten days. Consideration is given to extending the dosing interval to every two weeks to prevent further weight loss, as the goal is maintenance. Emphasis is placed on protein intake to support muscle mass and overall nutrition. - Continue Zepbound  5 mg every ten days, consider extending to every two weeks - Emphasize protein intake, consider protein pudding recipe - Monitor weight and adjust Zepbound  dosing if further weight loss occurs  Depression with anxiety and associated emotional eating Depression with anxiety is managed with sertraline  100 mg daily. Emotional eating behaviors are part of the treatment focus. She reports no blood sugar issues after discontinuing metformin  and topiramate , which were stopped due to lightheadedness. The current regimen is effective, with no indication for additional medication. - Continue Wellbutrin  SR 150 mg 2x per day - Continue sertraline  100 mg daily - Monitor for any changes in mood or emotional eating behaviors  Allergic rhinitis with ocular symptoms Allergic rhinitis with significant ocular symptoms, including burning eyes, has flared up. She has Flonase but often forgets to use it. Regular use of Flonase is recommended to alleviate  symptoms, as it helps with eye symptoms due to the connection between the eyes and nose. - Use Flonase regularly to manage ocular symptoms      Demara was informed of the importance of frequent follow up visits to maximize her success with intensive lifestyle modifications for her obesity and obesity related health conditions as recommended by USPSTF and CMS guidelines Follow up in 2 months   Louann Penton, MD

## 2023-12-24 DIAGNOSIS — M546 Pain in thoracic spine: Secondary | ICD-10-CM | POA: Diagnosis not present

## 2024-01-01 ENCOUNTER — Ambulatory Visit: Admitting: Adult Health

## 2024-01-01 ENCOUNTER — Encounter (HOSPITAL_BASED_OUTPATIENT_CLINIC_OR_DEPARTMENT_OTHER): Payer: Self-pay | Admitting: Cardiovascular Disease

## 2024-01-01 ENCOUNTER — Telehealth: Payer: Self-pay | Admitting: Neurology

## 2024-01-01 ENCOUNTER — Encounter (INDEPENDENT_AMBULATORY_CARE_PROVIDER_SITE_OTHER): Payer: Self-pay | Admitting: Family Medicine

## 2024-01-01 VITALS — BP 112/60 | HR 76

## 2024-01-01 DIAGNOSIS — G43709 Chronic migraine without aura, not intractable, without status migrainosus: Secondary | ICD-10-CM | POA: Diagnosis not present

## 2024-01-01 MED ORDER — ONABOTULINUMTOXINA 200 UNITS IJ SOLR
155.0000 [IU] | Freq: Once | INTRAMUSCULAR | Status: AC
  Administered 2024-01-01: 165 [IU] via INTRAMUSCULAR

## 2024-01-01 NOTE — Progress Notes (Signed)
 Botox - 100 units x 2 vial Lot: [id number] Expiration: 05/2025 NDC: 9976-6078-97   Bacteriostatic 0.9% Sodium Chloride - 4 mL  Lot: [id number] Expiration: 01/29/2024 NDC: 9590-8033-97   Dx: H56.290 B/B   Witnessed by: Rojean DEL

## 2024-01-01 NOTE — Progress Notes (Signed)
 Update 01/01/2024 JM: Patient returns for repeat Botox .  Prior injection 09/25/2023.  Reports continued benefit with Botox  injections.  Reports benefit with masseter injections for jaw pain/clenching that can trigger migraine headaches.  Tolerated procedure well today.  Return in 3 months for repeat injection.        Consent Form Botulism Toxin Injection For Chronic Migraine    Reviewed orally with patient, additionally signature is on file:  Botulism toxin has been approved by the Federal drug administration for treatment of chronic migraine. Botulism toxin does not cure chronic migraine and it may not be effective in some patients.  The administration of botulism toxin is accomplished by injecting a small amount of toxin into the muscles of the neck and head. Dosage must be titrated for each individual. Any benefits resulting from botulism toxin tend to wear off after 3 months with a repeat injection required if benefit is to be maintained. Injections are usually done every 3-4 months with maximum effect peak achieved by about 2 or 3 weeks. Botulism toxin is expensive and you should be sure of what costs you will incur resulting from the injection.  The side effects of botulism toxin use for chronic migraine may include:   -Transient, and usually mild, facial weakness with facial injections  -Transient, and usually mild, head or neck weakness with head/neck injections  -Reduction or loss of forehead facial animation due to forehead muscle weakness  -Eyelid drooping  -Dry eye  -Pain at the site of injection or bruising at the site of injection  -Double vision  -Potential unknown long term risks   Contraindications: You should not have Botox  if you are pregnant, nursing, allergic to albumin, have an infection, skin condition, or muscle weakness at the site of the injection, or have myasthenia gravis, Lambert-Eaton syndrome, or ALS.  It is also possible that as with any  injection, there may be an allergic reaction or no effect from the medication. Reduced effectiveness after repeated injections is sometimes seen and rarely infection at the injection site may occur. All care will be taken to prevent these side effects. If therapy is given over a long time, atrophy and wasting in the muscle injected may occur. Occasionally the patient's become refractory to treatment because they develop antibodies to the toxin. In this event, therapy needs to be modified.  I have read the above information and consent to the administration of botulism toxin.    BOTOX  PROCEDURE NOTE FOR MIGRAINE HEADACHE  Contraindications and precautions discussed with patient(above). Aseptic procedure was observed and patient tolerated procedure. Procedure performed by Harlene Bogaert, AGNP-BC.   The condition has existed for more than 6 months, and pt does not have a diagnosis of ALS, Myasthenia Gravis or Lambert-Eaton Syndrome.  Risks and benefits of injections discussed and pt agrees to proceed with the procedure.  Written consent obtained  These injections are medically necessary. Pt  receives good benefits from these injections. These injections do not cause sedations or hallucinations which the oral therapies may cause.   Description of procedure:  The patient was placed in a sitting position. The standard protocol was used for Botox  as follows, with 5 units of Botox  injected at each site:  -Procerus muscle, midline injection  -Corrugator muscle, bilateral injection  -Frontalis muscle, bilateral injection, with 2 sites each side, medial injection was performed in the upper one third of the frontalis muscle, in the region vertical from the medial inferior edge of the superior orbital rim.  The lateral injection was again in the upper one third of the forehead vertically above the lateral limbus of the cornea, 1.5 cm lateral to the medial injection site.  -Temporalis muscle injection, 4  sites, bilaterally. The first injection was 3 cm above the tragus of the ear, second injection site was 1.5 cm to 3 cm up from the first injection site in line with the tragus of the ear. The third injection site was 1.5-3 cm forward between the first 2 injection sites. The fourth injection site was 1.5 cm posterior to the second injection site. 5th site laterally in the temporalis  muscleat the level of the outer canthus.  -Occipitalis muscle injection, 3 sites, bilaterally. The first injection was done one half way between the occipital protuberance and the tip of the mastoid process behind the ear. The second injection site was done lateral and superior to the first, 1 fingerbreadth from the first injection. The third injection site was 1 fingerbreadth superiorly and medially from the first injection site.  -Cervical paraspinal muscle injection, 2 sites, bilaterally. The first injection site was 1 cm from the midline of the cervical spine, 3 cm inferior to the lower border of the occipital protuberance. The second injection site was 1.5 cm superiorly and laterally to the first injection site.  -Trapezius muscle injection was performed at 3 sites, bilaterally. The first injection site was in the upper trapezius muscle halfway between the inflection point of the neck, and the acromion. The second injection site was one half way between the acromion and the first injection site. The third injection was done between the first injection site and the inflection point of the neck.  -Masseter muscle injection was performed at 1 site, bilaterally    A total of 200 units of Botox  was prepared, 165 units of Botox  was injected as documented above, any Botox  not injected was wasted. The patient tolerated the procedure well, there were no complications of the above procedure.   Harlene Bogaert, AGNP-BC  [facility] [address] [place], [place] [address]  Phone [phone]  Fax [phone] Note: This document was prepared with digital dictation and possible smart phrase technology. Any transcriptional errors that result from this process are unintentional.

## 2024-01-01 NOTE — Telephone Encounter (Signed)
 Patient calling to reschedule Botox  due to scheduling conflict. Would like a call from the Botox  Coordinator if when transfer she do answer.

## 2024-01-06 ENCOUNTER — Ambulatory Visit: Admitting: Neurology

## 2024-01-23 ENCOUNTER — Encounter (INDEPENDENT_AMBULATORY_CARE_PROVIDER_SITE_OTHER): Payer: Self-pay | Admitting: Family Medicine

## 2024-01-27 ENCOUNTER — Other Ambulatory Visit (INDEPENDENT_AMBULATORY_CARE_PROVIDER_SITE_OTHER): Payer: Self-pay | Admitting: Family Medicine

## 2024-01-27 DIAGNOSIS — E669 Obesity, unspecified: Secondary | ICD-10-CM

## 2024-01-27 DIAGNOSIS — M546 Pain in thoracic spine: Secondary | ICD-10-CM | POA: Diagnosis not present

## 2024-02-02 DIAGNOSIS — M546 Pain in thoracic spine: Secondary | ICD-10-CM | POA: Diagnosis not present

## 2024-02-12 DIAGNOSIS — M546 Pain in thoracic spine: Secondary | ICD-10-CM | POA: Diagnosis not present

## 2024-02-16 ENCOUNTER — Encounter (INDEPENDENT_AMBULATORY_CARE_PROVIDER_SITE_OTHER): Payer: Self-pay | Admitting: Family Medicine

## 2024-02-16 ENCOUNTER — Ambulatory Visit (INDEPENDENT_AMBULATORY_CARE_PROVIDER_SITE_OTHER): Payer: Self-pay | Admitting: Family Medicine

## 2024-02-16 VITALS — BP 112/67 | HR 79 | Temp 97.5°F | Ht 61.0 in | Wt 127.0 lb

## 2024-02-16 DIAGNOSIS — F5089 Other specified eating disorder: Secondary | ICD-10-CM | POA: Diagnosis not present

## 2024-02-16 DIAGNOSIS — E559 Vitamin D deficiency, unspecified: Secondary | ICD-10-CM

## 2024-02-16 DIAGNOSIS — F3289 Other specified depressive episodes: Secondary | ICD-10-CM

## 2024-02-16 DIAGNOSIS — E669 Obesity, unspecified: Secondary | ICD-10-CM | POA: Diagnosis not present

## 2024-02-16 DIAGNOSIS — Z6824 Body mass index (BMI) 24.0-24.9, adult: Secondary | ICD-10-CM

## 2024-02-16 MED ORDER — VITAMIN D (ERGOCALCIFEROL) 1.25 MG (50000 UNIT) PO CAPS: ORAL_CAPSULE | ORAL | 1 refills | Status: DC

## 2024-02-16 MED ORDER — BUPROPION HCL ER (SR) 150 MG PO TB12: 150.0000 mg | ORAL_TABLET | Freq: Two times a day (BID) | ORAL | 0 refills | Status: AC

## 2024-02-16 NOTE — Progress Notes (Signed)
 Office: [phone]  /  Fax: [phone]  WEIGHT SUMMARY AND BIOMETRICS  Anthropometric Measurements Height: 5' 1 (1.549 m) Weight: 127 lb (57.6 kg) BMI (Calculated): 24.01 Weight at Last Visit: 127 lb Weight Lost Since Last Visit: 0 Weight Gained Since Last Visit: 0 Starting Weight: 216 lb Total Weight Loss (lbs): 89 lb (40.4 kg) Peak Weight: 216 lb   Body Composition  Body Fat %: 34.4 % Fat Mass (lbs): 43.6 lbs Muscle Mass (lbs): 79.2 lbs Total Body Water (lbs): 56.4 lbs Visceral Fat Rating : 9   Other Clinical Data Fasting: no Labs: no Today's Visit #: 54 Starting Date: 04/08/19 Comments: cat 2    Chief Complaint: OBESITY   History of Present Illness Iliana Hutt is a 73 year old female with obesity who presents for a follow-up on her weight management treatment.  She is maintaining her weight and adheres to her prescribed category two plan about 60% of the time. She engages in daily exercise by walking 20 to 30 minutes. Her weight has been stable over the last two months since her last visit.  She is currently taking bupropion  SR 150 mg twice a day for emotional eating behavior and reports stability on this medication without side effects. She requests a refill for this medication. Additionally, she is on a vitamin D  prescription, ergocalciferol , 50,000 IU per week, and requests a refill. She also takes Zepbound  5 mg weekly and requests a refill. She has been stretching out her Zepbound  doses to every 10 to 14 days and still experiences cravings for sweets, although it does not affect her weight.  She has signed up for eight sessions.  She is not currently donating blood and has experienced lightheadedness in the past, but not recently. Her last hemoglobin check over the summer was good, but not at a high level. No lightheadedness or dizziness recently.      PHYSICAL EXAM:  Blood pressure 112/67, pulse 79, temperature (!) 97.5 F (36.4 C),  height 5' 1 (1.549 m), weight 127 lb (57.6 kg), last menstrual period 12/27/2002, SpO2 95%. Body mass index is 24 kg/m.  DIAGNOSTIC DATA REVIEWED:  BMET    Component Value Date/Time   NA 135 09/03/2023 1008   K 3.7 09/03/2023 1008   CL 101 09/03/2023 1008   CO2 19 (L) 09/03/2023 1008   GLUCOSE 68 (L) 09/03/2023 1008   GLUCOSE 116 (H) 06/01/2021 0316   BUN 33 (H) 09/03/2023 1008   CREATININE 1.00 09/03/2023 1008   CREATININE 0.82 08/18/2012 0933   CALCIUM  9.3 09/03/2023 1008   GFRNONAA >60 06/01/2021 0316   GFRNONAA 78 08/18/2012 0933   GFRAA 76 03/14/2020 0000   GFRAA >89 08/18/2012 0933   Lab Results  Component Value Date   HGBA1C 5.1 09/03/2023   HGBA1C 5.6 04/08/2019   Lab Results  Component Value Date   INSULIN  3.5 09/03/2023   INSULIN  12.1 04/08/2019   Lab Results  Component Value Date   TSH 3.460 09/03/2023   CBC    Component Value Date/Time   WBC 4.6 09/03/2023 1008   WBC 8.2 06/02/2021 0314   RBC 3.81 09/03/2023 1008   RBC 3.50 (L) 06/02/2021 0314   HGB 11.7 09/03/2023 1008   HCT 36.5 09/03/2023 1008   PLT 276 09/03/2023 1008   MCV 96 09/03/2023 1008   MCH 30.7 09/03/2023 1008   MCH 30.9 06/02/2021 0314   MCHC 32.1 09/03/2023 1008   MCHC 31.2 06/02/2021 0314   RDW 12.8  09/03/2023 1008   Iron Studies No results found for: IRON, TIBC, FERRITIN, IRONPCTSAT Lipid Panel     Component Value Date/Time   CHOL 98 (L) 09/03/2023 1008   TRIG 49 09/03/2023 1008   HDL 53 09/03/2023 1008   LDLCALC 33 09/03/2023 1008   Hepatic Function Panel     Component Value Date/Time   PROT 6.4 09/03/2023 1008   ALBUMIN 4.3 09/03/2023 1008   AST 25 09/03/2023 1008   ALT 19 09/03/2023 1008   ALKPHOS 97 09/03/2023 1008   BILITOT 0.3 09/03/2023 1008      Component Value Date/Time   TSH 3.460 09/03/2023 1008   Nutritional Lab Results  Component Value Date   VD25OH 85.2 09/03/2023   VD25OH 66.0 03/18/2023   VD25OH 109.0 (H) 12/31/2022      Assessment and Plan Assessment & Plan Obesity, currently controlled and weight stable on pharmacotherapy and lifestyle modification Obesity is well-managed with pharmacotherapy and lifestyle modifications. She adheres to her prescribed category two plan 60% of the time and exercises 20-30 minutes daily. She has maintained her weight over the last two months. She is on Zepbound  5 mg weekly, which she has been stretching out to every 10-14 days. She experiences cravings for sweets but maintains her weight. There is a potential change in Zepbound  pricing, which may affect her treatment plan. - Continue Zepbound  5 mg weekly, use remaining vials before considering new prescription. - Discussed potential changes in Zepbound  pricing and will reassess treatment plan when current vials are depleted.  Vitamin D  deficiency, monitoring and ongoing supplementation Vitamin D  deficiency is managed with ergocalciferol  50,000 IU weekly. She reports taking it every other week due to previous high levels. The last vitamin D  level was 85, indicating a need for monitoring. - Ordered vitamin D  level to assess current status. - Continue ergocalciferol  50,000 IU weekly, adjust frequency based on lab results.  Emotional eating, stable on bupropion  Depression related to emotional eating is well-managed on bupropion  150 mg twice daily. She reports no side effects and is stable on this medication. - Continue bupropion  150 mg twice daily.    Mileydi was counseled on the importance of maintaining healthy lifestyle habits, including balanced nutrition, regular physical activity, and behavioral modifications, while taking antiobesity medication.  Patient verbalized understanding that medication is an adjunct to, not a replacement for, lifestyle changes and that the long-term success and weight maintenance depend on continued adherence to these strategies.   Mariaha was informed of the importance of frequent follow up  visits to maximize her success with intensive lifestyle modifications for her obesity and obesity related health conditions as recommended by USPSTF and CMS guidelines   Louann Penton, MD

## 2024-02-18 DIAGNOSIS — E559 Vitamin D deficiency, unspecified: Secondary | ICD-10-CM | POA: Diagnosis not present

## 2024-02-19 LAB — VITAMIN D 25 HYDROXY (VIT D DEFICIENCY, FRACTURES): Vit D, 25-Hydroxy: 55.7 ng/mL (ref 30.0–100.0)

## 2024-02-28 ENCOUNTER — Encounter (INDEPENDENT_AMBULATORY_CARE_PROVIDER_SITE_OTHER): Payer: Self-pay | Admitting: Family Medicine

## 2024-02-28 DIAGNOSIS — E669 Obesity, unspecified: Secondary | ICD-10-CM

## 2024-03-01 ENCOUNTER — Ambulatory Visit (INDEPENDENT_AMBULATORY_CARE_PROVIDER_SITE_OTHER): Payer: Medicare HMO

## 2024-03-01 DIAGNOSIS — I083 Combined rheumatic disorders of mitral, aortic and tricuspid valves: Secondary | ICD-10-CM

## 2024-03-01 DIAGNOSIS — I5189 Other ill-defined heart diseases: Secondary | ICD-10-CM | POA: Diagnosis not present

## 2024-03-01 LAB — ECHOCARDIOGRAM COMPLETE
Area-P 1/2: 3.6 cm2
Calc EF: 54.3 %
MV M vel: 4.7 m/s
MV Peak grad: 88.4 mmHg
P 1/2 time: 533 ms
Radius: 0.5 cm
S' Lateral: 3.33 cm
Single Plane A2C EF: 54.3 %
Single Plane A4C EF: 53.7 %

## 2024-03-01 MED ORDER — ZEPBOUND 5 MG/0.5ML ~~LOC~~ SOLN: 5.0000 mg | SUBCUTANEOUS | 0 refills | Status: AC

## 2024-03-01 NOTE — Telephone Encounter (Signed)
 LAST APPOINTMENT DATE: 02/16/2024 NEXT APPOINTMENT DATE: 04/15/2024   [id number] - [place], [place] - [address] AT [address] [facility] [address] [place] [address] Phone: [phone] Fax: [phone]  [facility] - [place], [place] - [phone] [place] [address] Phone: [phone] Fax: [phone]  Patient is requesting a refill of the following medications: Requested Prescriptions    No prescriptions requested or ordered in this encounter    Date last filled: 12/11/2023 Previously prescribed by Dr Berkeley  Lab Results  Component Value Date   HGBA1C 5.1 09/03/2023   HGBA1C 5.3 12/31/2022   HGBA1C 5.1 05/22/2022   Lab Results  Component Value Date   LDLCALC 33 09/03/2023   CREATININE 1.00 09/03/2023   Lab Results  Component Value Date   VD25OH 55.7 02/18/2024   VD25OH 85.2 09/03/2023   VD25OH 66.0 03/18/2023    BP Readings from Last 3 Encounters:  02/16/24 112/67  01/01/24 112/60  12/22/23 101/68

## 2024-03-05 ENCOUNTER — Ambulatory Visit (HOSPITAL_BASED_OUTPATIENT_CLINIC_OR_DEPARTMENT_OTHER): Payer: Self-pay | Admitting: Family

## 2024-03-05 DIAGNOSIS — Z5181 Encounter for therapeutic drug level monitoring: Secondary | ICD-10-CM

## 2024-03-10 ENCOUNTER — Telehealth: Payer: Self-pay | Admitting: Adult Health

## 2024-03-10 NOTE — Telephone Encounter (Signed)
 Faxed PA request to [employer] to transition pt to [facility] @ [phone].

## 2024-03-19 NOTE — Progress Notes (Addendum)
 Melissa Leach                                          MRN: [id number]   04/15/2024   The [facility]BCI Quality Team Specialist reviewed this patient medical record for the purposes of chart review for care gap closure. The following were reviewed: chart review for care gap closure-kidney health evaluation for diabetes:eGFR  and uACR.    VBCI Quality Team

## 2024-03-22 ENCOUNTER — Other Ambulatory Visit (INDEPENDENT_AMBULATORY_CARE_PROVIDER_SITE_OTHER): Payer: Self-pay | Admitting: Family Medicine

## 2024-03-22 DIAGNOSIS — E669 Obesity, unspecified: Secondary | ICD-10-CM

## 2024-03-29 ENCOUNTER — Other Ambulatory Visit (HOSPITAL_COMMUNITY): Payer: Self-pay

## 2024-03-29 ENCOUNTER — Ambulatory Visit: Admitting: Adult Health

## 2024-03-29 ENCOUNTER — Other Ambulatory Visit: Payer: Self-pay | Admitting: Pharmacy Technician

## 2024-03-29 ENCOUNTER — Other Ambulatory Visit: Payer: Self-pay

## 2024-03-29 MED ORDER — BOTOX 200 UNITS IJ SOLR
INTRAMUSCULAR | 3 refills | Status: DC
  Filled 2024-03-29: qty 1, fill #0

## 2024-03-29 NOTE — Addendum Note (Signed)
 Addended by: NEYSA NENA RAMAN on: 03/29/2024 11:57 AM   Modules accepted: Orders

## 2024-03-29 NOTE — Telephone Encounter (Signed)
 Please send rx to [facility], thank you. Appt on 04/08/24.

## 2024-03-30 MED ORDER — FUROSEMIDE 20 MG PO TABS: 20.0000 mg | ORAL_TABLET | Freq: Every day | ORAL | 1 refills | Status: AC | PRN

## 2024-03-30 NOTE — Telephone Encounter (Signed)
Discussed echo results with patient.

## 2024-04-02 ENCOUNTER — Other Ambulatory Visit: Payer: Self-pay

## 2024-04-05 NOTE — Telephone Encounter (Signed)
 Submitted PA under new ins via CMM, status is pending. Key: [id number]

## 2024-04-06 ENCOUNTER — Other Ambulatory Visit (HOSPITAL_COMMUNITY): Payer: Self-pay

## 2024-04-06 ENCOUNTER — Telehealth: Payer: Self-pay | Admitting: Adult Health

## 2024-04-06 NOTE — Telephone Encounter (Signed)
 [employer] (Melia) calling to make aware Botox  has been approved 07/30/24-07/30/25. Will also be faxing over the approval

## 2024-04-06 NOTE — Telephone Encounter (Signed)
 Auth#: [id number] (04/05/24-04/05/25)

## 2024-04-06 NOTE — Telephone Encounter (Signed)
 Updated Botox  encounter.

## 2024-04-08 ENCOUNTER — Ambulatory Visit: Admitting: Adult Health

## 2024-04-13 ENCOUNTER — Other Ambulatory Visit (HOSPITAL_COMMUNITY): Payer: Self-pay

## 2024-04-13 ENCOUNTER — Other Ambulatory Visit: Payer: Self-pay

## 2024-04-13 DIAGNOSIS — G43709 Chronic migraine without aura, not intractable, without status migrainosus: Secondary | ICD-10-CM

## 2024-04-13 MED ORDER — BOTOX 200 UNITS IJ SOLR: INTRAMUSCULAR | 3 refills | Status: DC

## 2024-04-13 NOTE — Telephone Encounter (Signed)
 Melissa Leach

## 2024-04-13 NOTE — Telephone Encounter (Signed)
Botox sent to [facility]

## 2024-04-13 NOTE — Telephone Encounter (Signed)
 Can you send her Botox  rx to [facility] please? It looks like they are preferred for her ins. Thank you

## 2024-04-14 ENCOUNTER — Other Ambulatory Visit (HOSPITAL_COMMUNITY): Payer: Self-pay

## 2024-04-15 ENCOUNTER — Encounter (INDEPENDENT_AMBULATORY_CARE_PROVIDER_SITE_OTHER): Payer: Self-pay | Admitting: Family Medicine

## 2024-04-15 ENCOUNTER — Ambulatory Visit (INDEPENDENT_AMBULATORY_CARE_PROVIDER_SITE_OTHER): Admitting: Family Medicine

## 2024-04-15 VITALS — BP 95/61 | HR 82 | Temp 97.5°F | Ht 61.0 in | Wt 129.0 lb

## 2024-04-15 DIAGNOSIS — F3289 Other specified depressive episodes: Secondary | ICD-10-CM

## 2024-04-15 DIAGNOSIS — E669 Obesity, unspecified: Secondary | ICD-10-CM

## 2024-04-15 DIAGNOSIS — Z6824 Body mass index (BMI) 24.0-24.9, adult: Secondary | ICD-10-CM | POA: Diagnosis not present

## 2024-04-15 DIAGNOSIS — E559 Vitamin D deficiency, unspecified: Secondary | ICD-10-CM | POA: Diagnosis not present

## 2024-04-15 DIAGNOSIS — R7303 Prediabetes: Secondary | ICD-10-CM | POA: Diagnosis not present

## 2024-04-15 MED ORDER — METFORMIN HCL 500 MG PO TABS: 500.0000 mg | ORAL_TABLET | Freq: Every day | ORAL | 0 refills | Status: AC

## 2024-04-15 MED ORDER — METFORMIN HCL 500 MG PO TABS: 500.0000 mg | ORAL_TABLET | Freq: Every day | ORAL | 0 refills | Status: DC

## 2024-04-15 MED ORDER — VITAMIN D (ERGOCALCIFEROL) 1.25 MG (50000 UNIT) PO CAPS: ORAL_CAPSULE | ORAL | 1 refills | Status: AC

## 2024-04-15 MED ORDER — ZEPBOUND 7.5 MG/0.5ML ~~LOC~~ SOLN: 7.5000 mg | SUBCUTANEOUS | 0 refills | Status: AC

## 2024-04-15 NOTE — Progress Notes (Signed)
 "  Office: [phone]  /  Fax: [phone]  WEIGHT SUMMARY AND BIOMETRICS  Anthropometric Measurements Height: 5' 1 (1.549 m) Weight: 129 lb (58.5 kg) BMI (Calculated): 24.39 Weight at Last Visit: 127 lb Weight Lost Since Last Visit: 0 Weight Gained Since Last Visit: 2 lb Starting Weight: 216 lb Total Weight Loss (lbs): 87 lb (39.5 kg) Peak Weight: 216 lb   Body Composition  Body Fat %: 36.4 % Fat Mass (lbs): 47.2 lbs Muscle Mass (lbs): 78.2 lbs Total Body Water (lbs): 57.2 lbs Visceral Fat Rating : 9   Other Clinical Data Fasting: no Labs: no Today's Visit #: 55 Starting Date: 04/08/19    Chief Complaint: OBESITY    History of Present Illness Melissa Leach is a 74 year old female who presents for obesity treatment plan assessment and progress evaluation.  She has been adhering to the category two eating plan approximately seventy percent of the time and engages in regular physical activity, including Silver Sneakers, weight training, and walking for sixty minutes five days a week. Despite these efforts, she has gained two pounds over the last two months, which she attributes to the holiday season. She is working on resuming her plan. She reports increased cravings and feels that her current medication, Zepbound , is not as effective as before. She has lost a total of 87 pounds since starting the program.  She experiences polyphagia and is currently on Zepbound , 5 mg once a week. She requests a refill and is concerned that the medication's effects have diminished, as she constantly thinks about food and consumes both healthy and unhealthy foods. She feels the current dose is ineffective.  She is being treated for vitamin D  deficiency and takes ergocalciferol , 2000 IU per week. Her last vitamin D  level in November was 55.7, which is within a good range. She requests a refill for her prescription.  She struggles with adequate hydration and protein intake. She  mentions a past cardiac echo and reports that the nurse told her her right heart pressure was high, which led to a prescription of Lasix  as needed for fluid retention. She takes it when her ankles are swollen or she cannot remove her rings, often related to sodium intake.  She is actively involved in physical activities and social engagements, including tutoring and delivering meals. She attends a private heart rehab session twice a week, focusing on muscle building and weight training.      PHYSICAL EXAM:  Blood pressure 95/61, pulse 82, temperature (!) 97.5 F (36.4 C), height 5' 1 (1.549 m), weight 129 lb (58.5 kg), last menstrual period 12/27/2002, SpO2 100%. Body mass index is 24.37 kg/m.  DIAGNOSTIC DATA REVIEWED BY MYSELF TODAY:  BMET    Component Value Date/Time   NA 135 09/03/2023 1008   K 3.7 09/03/2023 1008   CL 101 09/03/2023 1008   CO2 19 (L) 09/03/2023 1008   GLUCOSE 68 (L) 09/03/2023 1008   GLUCOSE 116 (H) 06/01/2021 0316   BUN 33 (H) 09/03/2023 1008   CREATININE 1.00 09/03/2023 1008   CREATININE 0.82 08/18/2012 0933   CALCIUM  9.3 09/03/2023 1008   GFRNONAA >60 06/01/2021 0316   GFRNONAA 78 08/18/2012 0933   GFRAA 76 03/14/2020 0000   GFRAA >89 08/18/2012 0933   Lab Results  Component Value Date   HGBA1C 5.1 09/03/2023   HGBA1C 5.6 04/08/2019   Lab Results  Component Value Date   INSULIN  3.5 09/03/2023   INSULIN  12.1 04/08/2019   Lab Results  Component Value Date   TSH 3.460 09/03/2023   CBC    Component Value Date/Time   WBC 4.6 09/03/2023 1008   WBC 8.2 06/02/2021 0314   RBC 3.81 09/03/2023 1008   RBC 3.50 (L) 06/02/2021 0314   HGB 11.7 09/03/2023 1008   HCT 36.5 09/03/2023 1008   PLT 276 09/03/2023 1008   MCV 96 09/03/2023 1008   MCH 30.7 09/03/2023 1008   MCH 30.9 06/02/2021 0314   MCHC 32.1 09/03/2023 1008   MCHC 31.2 06/02/2021 0314   RDW 12.8 09/03/2023 1008   Iron Studies No results found for: IRON, TIBC, FERRITIN,  IRONPCTSAT Lipid Panel     Component Value Date/Time   CHOL 98 (L) 09/03/2023 1008   TRIG 49 09/03/2023 1008   HDL 53 09/03/2023 1008   LDLCALC 33 09/03/2023 1008   Hepatic Function Panel     Component Value Date/Time   PROT 6.4 09/03/2023 1008   ALBUMIN 4.3 09/03/2023 1008   AST 25 09/03/2023 1008   ALT 19 09/03/2023 1008   ALKPHOS 97 09/03/2023 1008   BILITOT 0.3 09/03/2023 1008      Component Value Date/Time   TSH 3.460 09/03/2023 1008   Nutritional Lab Results  Component Value Date   VD25OH 55.7 02/18/2024   VD25OH 85.2 09/03/2023   VD25OH 66.0 03/18/2023     Assessment and Plan Assessment & Plan Obesity Management is ongoing with a category two eating plan, followed 70% of the time. Exercise regimen includes Silver Sneakers, weight training, and walking 60 minutes five days a week. Recent weight gain of 2 pounds over the last two months, attributed to holiday period. Current medication Zepbound  5 mg weekly is perceived as ineffective. Discussed increasing Zepbound  to 7.5 mg every ten days. Discussed alternative of Wegovy pill, but Zepbound  preferred due to fewer side effects. Emphasized importance of stretching medication to avoid nausea. - Increased Zepbound  to 7.5 mg every ten days - Continue current exercise regimen - Encouraged adherence to category two eating plan  Vitamin D  deficiency Managed with prescription ergocalciferol  2000 IU weekly. Last vitamin D  level in November was 55.7, within a good range. - Refilled ergocalciferol  50000 IU weekly  Prediabetes Managed with metformin . She has been taking metformin  every other day due to excess supply. Discussed benefits of continuing metformin  for prediabetes management. - Restart metformin  500 mg daily - Continue diet, exercise and weight loss as discussed today as an important part of the treatment plan       Patients who are on anti-obesity medications are counseled on the importance of maintaining  healthy lifestyle habits, including balanced nutrition, regular physical activity, and behavioral modifications,  Medication is an adjunct to, not a replacement for, lifestyle changes and that the long-term success and weight maintenance depend on continued adherence to these strategies.   Melissa Leach was informed of the importance of frequent follow up visits to maximize her success with intensive lifestyle modifications for her obesity and obesity related health conditions as recommended by USPSTF and CMS guidelines   Louann Penton, MD   "

## 2024-04-15 NOTE — Addendum Note (Signed)
 Addended by: LAFE BAKER CROME on: 04/15/2024 11:14 AM   Modules accepted: Orders

## 2024-04-19 ENCOUNTER — Telehealth: Payer: Self-pay | Admitting: Adult Health

## 2024-04-19 NOTE — Telephone Encounter (Signed)
 Patient cancelled appointment and discontinue treatment due to insurance deductible has doubled.

## 2024-04-27 ENCOUNTER — Encounter (INDEPENDENT_AMBULATORY_CARE_PROVIDER_SITE_OTHER): Payer: Self-pay | Admitting: Family Medicine

## 2024-04-28 ENCOUNTER — Telehealth (HOSPITAL_BASED_OUTPATIENT_CLINIC_OR_DEPARTMENT_OTHER): Payer: Self-pay

## 2024-04-28 ENCOUNTER — Ambulatory Visit: Admitting: Adult Health

## 2024-04-28 ENCOUNTER — Other Ambulatory Visit (INDEPENDENT_AMBULATORY_CARE_PROVIDER_SITE_OTHER): Payer: Self-pay | Admitting: Family Medicine

## 2024-04-28 DIAGNOSIS — E7849 Other hyperlipidemia: Secondary | ICD-10-CM

## 2024-04-28 DIAGNOSIS — E559 Vitamin D deficiency, unspecified: Secondary | ICD-10-CM

## 2024-04-28 DIAGNOSIS — R7303 Prediabetes: Secondary | ICD-10-CM

## 2024-04-28 DIAGNOSIS — E039 Hypothyroidism, unspecified: Secondary | ICD-10-CM

## 2024-04-28 NOTE — Telephone Encounter (Signed)
 Patient has not been seen in our office in over 1 year. Therefore, she will need an in-office visit. She already has a visit with Katlyn West, NP, on 05/06/2024. Pre-op evaluation can be addressed at that time. Will route this to Katlyn so she is aware of upcoming procedure.Will remove from pre-op pool.  Rexton Greulich E Gerlad Pelzel, PA-C 04/28/2024 11:06 AM

## 2024-04-28 NOTE — Telephone Encounter (Signed)
"  ° °  Pre-operative Risk Assessment    Patient Name: Melissa Leach  DOB: November 16, 1950 MRN: [id number]   Date of last office visit: 03/10/2023 with Dr. Raford Date of next office visit: 05/06/2024 with Katlyn West, NP  Request for Surgical Clearance    Procedure:  Facelift  Date of Surgery:  Clearance TBD                                 Surgeon:  Eva Cordia, MD Surgeon's Group or Practice Name:  [facility] Phone number: [phone] Fax number:  [phone]   Type of Clearance Requested:   - Medical    Type of Anesthesia:  Not indicated   Additional requests/questions: Anesthesia time: 4 hours  Signed, Patrcia Iverson CROME   04/28/2024, 8:13 AM   "

## 2024-04-30 LAB — CMP14+EGFR
ALT: 16 [IU]/L (ref 0–32)
AST: 26 [IU]/L (ref 0–40)
Albumin: 4.2 g/dL (ref 3.8–4.8)
Alkaline Phosphatase: 94 [IU]/L (ref 49–135)
BUN/Creatinine Ratio: 28 (ref 12–28)
BUN: 24 mg/dL (ref 8–27)
Bilirubin Total: 0.4 mg/dL (ref 0.0–1.2)
CO2: 20 mmol/L (ref 20–29)
Calcium: 9.3 mg/dL (ref 8.7–10.3)
Chloride: 102 mmol/L (ref 96–106)
Creatinine, Ser: 0.85 mg/dL (ref 0.57–1.00)
Globulin, Total: 2 g/dL (ref 1.5–4.5)
Glucose: 77 mg/dL (ref 70–99)
Potassium: 4.5 mmol/L (ref 3.5–5.2)
Sodium: 136 mmol/L (ref 134–144)
Total Protein: 6.2 g/dL (ref 6.0–8.5)
eGFR: 73 mL/min/{1.73_m2}

## 2024-04-30 LAB — HEMOGLOBIN A1C
Est. average glucose Bld gHb Est-mCnc: 94 mg/dL
Hgb A1c MFr Bld: 4.9 % (ref 4.8–5.6)

## 2024-04-30 LAB — CBC WITH DIFFERENTIAL/PLATELET
Basophils Absolute: 0.1 10*3/uL (ref 0.0–0.2)
Basos: 1 %
EOS (ABSOLUTE): 0.3 10*3/uL (ref 0.0–0.4)
Eos: 7 %
Hematocrit: 36.8 % (ref 34.0–46.6)
Hemoglobin: 11.8 g/dL (ref 11.1–15.9)
Immature Grans (Abs): 0 10*3/uL (ref 0.0–0.1)
Immature Granulocytes: 0 %
Lymphocytes Absolute: 1 10*3/uL (ref 0.7–3.1)
Lymphs: 21 %
MCH: 31.8 pg (ref 26.6–33.0)
MCHC: 32.1 g/dL (ref 31.5–35.7)
MCV: 99 fL — ABNORMAL HIGH (ref 79–97)
Monocytes Absolute: 0.5 10*3/uL (ref 0.1–0.9)
Monocytes: 10 %
Neutrophils Absolute: 2.9 10*3/uL (ref 1.4–7.0)
Neutrophils: 61 %
Platelets: 261 10*3/uL (ref 150–450)
RBC: 3.71 x10E6/uL — ABNORMAL LOW (ref 3.77–5.28)
RDW: 12.1 % (ref 11.7–15.4)
WBC: 4.7 10*3/uL (ref 3.4–10.8)

## 2024-04-30 LAB — LIPID PANEL WITH LDL/HDL RATIO
Cholesterol, Total: 97 mg/dL — ABNORMAL LOW (ref 100–199)
HDL: 63 mg/dL
LDL Chol Calc (NIH): 23 mg/dL (ref 0–99)
LDL/HDL Ratio: 0.4 ratio (ref 0.0–3.2)
Triglycerides: 41 mg/dL (ref 0–149)
VLDL Cholesterol Cal: 11 mg/dL (ref 5–40)

## 2024-04-30 LAB — VITAMIN D 25 HYDROXY (VIT D DEFICIENCY, FRACTURES): Vit D, 25-Hydroxy: 49 ng/mL (ref 30.0–100.0)

## 2024-04-30 LAB — T4, FREE: Free T4: 1.52 ng/dL (ref 0.82–1.77)

## 2024-04-30 LAB — TSH: TSH: 4.33 u[IU]/mL (ref 0.450–4.500)

## 2024-04-30 LAB — T3: T3, Total: 67 ng/dL — ABNORMAL LOW (ref 71–180)

## 2024-04-30 LAB — INSULIN, RANDOM: INSULIN: 12 u[IU]/mL (ref 2.6–24.9)

## 2024-05-03 ENCOUNTER — Encounter (HOSPITAL_BASED_OUTPATIENT_CLINIC_OR_DEPARTMENT_OTHER): Payer: Self-pay

## 2024-05-06 ENCOUNTER — Encounter: Payer: Self-pay | Admitting: Cardiology

## 2024-05-06 ENCOUNTER — Ambulatory Visit: Payer: Self-pay | Admitting: Cardiology

## 2024-05-06 VITALS — BP 102/62 | HR 80 | Ht 61.0 in | Wt 134.2 lb

## 2024-05-06 DIAGNOSIS — Z01810 Encounter for preprocedural cardiovascular examination: Secondary | ICD-10-CM | POA: Diagnosis not present

## 2024-05-06 DIAGNOSIS — I5189 Other ill-defined heart diseases: Secondary | ICD-10-CM | POA: Diagnosis not present

## 2024-05-06 DIAGNOSIS — R0609 Other forms of dyspnea: Secondary | ICD-10-CM

## 2024-05-06 DIAGNOSIS — I700 Atherosclerosis of aorta: Secondary | ICD-10-CM | POA: Diagnosis not present

## 2024-05-06 NOTE — Patient Instructions (Signed)
 Medication Instructions:  Your physician recommends that you continue on your current medications as directed. Please refer to the Current Medication list given to you today.  *If you need a refill on your cardiac medications before your next appointment, please call your pharmacy*  Lab Work: NONE If you have labs (blood work) drawn today and your tests are completely normal, you will receive your results only by: MyChart Message (if you have MyChart) OR A paper copy in the mail If you have any lab test that is abnormal or we need to change your treatment, we will call you to review the results.  Testing/Procedures: NONE  Follow-Up: At [facility], you and your health needs are our priority.  As part of our continuing mission to provide you with exceptional heart care, our providers are all part of one team.  This team includes your primary Cardiologist (physician) and Advanced Practice Providers or APPs (Physician Assistants and Nurse Practitioners) who all work together to provide you with the care you need, when you need it.  Your next appointment:   1 year(s)  Provider:   Dr. Raford

## 2024-05-19 ENCOUNTER — Ambulatory Visit (INDEPENDENT_AMBULATORY_CARE_PROVIDER_SITE_OTHER): Admitting: Family Medicine

## 2024-06-24 ENCOUNTER — Ambulatory Visit (HOSPITAL_BASED_OUTPATIENT_CLINIC_OR_DEPARTMENT_OTHER): Admitting: Cardiovascular Disease

## 2024-07-14 ENCOUNTER — Ambulatory Visit (INDEPENDENT_AMBULATORY_CARE_PROVIDER_SITE_OTHER): Admitting: Family Medicine
